# Patient Record
Sex: Male | Born: 1940 | Race: Black or African American | Hispanic: No | Marital: Single | Smoking: Former smoker
Health system: Southern US, Community
[De-identification: ages and names within clinical notes are randomized; demographics above are authoritative.]

## PROBLEM LIST (undated history)

## (undated) DIAGNOSIS — N312 Flaccid neuropathic bladder, not elsewhere classified: Secondary | ICD-10-CM

## (undated) DIAGNOSIS — M7500 Adhesive capsulitis of unspecified shoulder: Secondary | ICD-10-CM

## (undated) DIAGNOSIS — M25511 Pain in right shoulder: Secondary | ICD-10-CM

## (undated) DIAGNOSIS — E785 Hyperlipidemia, unspecified: Secondary | ICD-10-CM

## (undated) DIAGNOSIS — C801 Malignant (primary) neoplasm, unspecified: Secondary | ICD-10-CM

## (undated) DIAGNOSIS — N451 Epididymitis: Secondary | ICD-10-CM

## (undated) DIAGNOSIS — M25569 Pain in unspecified knee: Secondary | ICD-10-CM

## (undated) DIAGNOSIS — G8929 Other chronic pain: Secondary | ICD-10-CM

## (undated) DIAGNOSIS — N400 Enlarged prostate without lower urinary tract symptoms: Secondary | ICD-10-CM

## (undated) DIAGNOSIS — B029 Zoster without complications: Secondary | ICD-10-CM

## (undated) DIAGNOSIS — I10 Essential (primary) hypertension: Secondary | ICD-10-CM

## (undated) DIAGNOSIS — K0510 Chronic gingivitis, plaque induced: Secondary | ICD-10-CM

## (undated) DIAGNOSIS — R001 Bradycardia, unspecified: Secondary | ICD-10-CM

## (undated) HISTORY — DX: Hyperlipidemia, unspecified: E78.5

## (undated) HISTORY — DX: Chronic gingivitis, plaque induced: K05.10

## (undated) HISTORY — DX: Flaccid neuropathic bladder, not elsewhere classified: N31.2

## (undated) HISTORY — DX: Benign prostatic hyperplasia without lower urinary tract symptoms: N40.0

## (undated) HISTORY — DX: Essential (primary) hypertension: I10

## (undated) HISTORY — DX: Other chronic pain: G89.29

## (undated) HISTORY — PX: KNEE SURGERY: SHX244

## (undated) HISTORY — DX: Epididymitis: N45.1

## (undated) HISTORY — PX: TRANSURETHRAL RESECTION OF PROSTATE: SHX73

## (undated) HISTORY — DX: Pain in right shoulder: M25.511

## (undated) HISTORY — DX: Bradycardia, unspecified: R00.1

## (undated) HISTORY — PX: VASECTOMY: SHX75

## (undated) HISTORY — DX: Pain in unspecified knee: M25.569

## (undated) HISTORY — DX: Adhesive capsulitis of unspecified shoulder: M75.00

---

## 2000-10-10 ENCOUNTER — Encounter: Payer: Self-pay | Admitting: Emergency Medicine

## 2000-10-10 ENCOUNTER — Emergency Department (HOSPITAL_COMMUNITY): Admission: EM | Admit: 2000-10-10 | Discharge: 2000-10-10 | Payer: Self-pay | Admitting: Emergency Medicine

## 2000-11-25 ENCOUNTER — Emergency Department (HOSPITAL_COMMUNITY): Admission: EM | Admit: 2000-11-25 | Discharge: 2000-11-25 | Payer: Self-pay | Admitting: Emergency Medicine

## 2001-12-15 ENCOUNTER — Emergency Department (HOSPITAL_COMMUNITY): Admission: EM | Admit: 2001-12-15 | Discharge: 2001-12-15 | Payer: Self-pay

## 2001-12-26 ENCOUNTER — Emergency Department (HOSPITAL_COMMUNITY): Admission: EM | Admit: 2001-12-26 | Discharge: 2001-12-26 | Payer: Self-pay | Admitting: Emergency Medicine

## 2002-08-28 ENCOUNTER — Emergency Department (HOSPITAL_COMMUNITY): Admission: EM | Admit: 2002-08-28 | Discharge: 2002-08-28 | Payer: Self-pay

## 2002-09-25 ENCOUNTER — Encounter: Admission: RE | Admit: 2002-09-25 | Discharge: 2002-09-25 | Payer: Self-pay | Admitting: Internal Medicine

## 2002-10-02 ENCOUNTER — Encounter: Admission: RE | Admit: 2002-10-02 | Discharge: 2002-10-02 | Payer: Self-pay | Admitting: Internal Medicine

## 2003-03-09 ENCOUNTER — Emergency Department (HOSPITAL_COMMUNITY): Admission: EM | Admit: 2003-03-09 | Discharge: 2003-03-09 | Payer: Self-pay | Admitting: Emergency Medicine

## 2003-04-07 ENCOUNTER — Encounter: Admission: RE | Admit: 2003-04-07 | Discharge: 2003-04-07 | Payer: Self-pay | Admitting: Internal Medicine

## 2003-07-09 ENCOUNTER — Encounter: Admission: RE | Admit: 2003-07-09 | Discharge: 2003-07-09 | Payer: Self-pay | Admitting: Internal Medicine

## 2003-08-16 ENCOUNTER — Encounter: Admission: RE | Admit: 2003-08-16 | Discharge: 2003-08-16 | Payer: Self-pay | Admitting: Internal Medicine

## 2003-08-26 ENCOUNTER — Encounter: Admission: RE | Admit: 2003-08-26 | Discharge: 2003-08-26 | Payer: Self-pay | Admitting: Internal Medicine

## 2003-11-30 ENCOUNTER — Encounter: Admission: RE | Admit: 2003-11-30 | Discharge: 2003-11-30 | Payer: Self-pay | Admitting: Internal Medicine

## 2004-10-27 ENCOUNTER — Ambulatory Visit: Payer: Self-pay | Admitting: Internal Medicine

## 2004-11-07 ENCOUNTER — Ambulatory Visit: Payer: Self-pay | Admitting: Internal Medicine

## 2005-03-13 ENCOUNTER — Ambulatory Visit: Payer: Self-pay | Admitting: Internal Medicine

## 2005-03-27 ENCOUNTER — Emergency Department (HOSPITAL_COMMUNITY): Admission: EM | Admit: 2005-03-27 | Discharge: 2005-03-27 | Payer: Self-pay | Admitting: Emergency Medicine

## 2005-04-16 ENCOUNTER — Ambulatory Visit: Payer: Self-pay | Admitting: Cardiovascular Disease

## 2005-04-26 ENCOUNTER — Emergency Department (HOSPITAL_COMMUNITY): Admission: EM | Admit: 2005-04-26 | Discharge: 2005-04-26 | Payer: Self-pay | Admitting: Emergency Medicine

## 2005-04-27 ENCOUNTER — Ambulatory Visit: Payer: Self-pay

## 2005-06-22 HISTORY — PX: OTHER SURGICAL HISTORY: SHX169

## 2005-06-28 ENCOUNTER — Ambulatory Visit: Payer: Self-pay | Admitting: Internal Medicine

## 2005-07-13 ENCOUNTER — Ambulatory Visit (HOSPITAL_BASED_OUTPATIENT_CLINIC_OR_DEPARTMENT_OTHER): Admission: RE | Admit: 2005-07-13 | Discharge: 2005-07-13 | Payer: Self-pay | Admitting: Orthopedic Surgery

## 2005-07-13 ENCOUNTER — Ambulatory Visit (HOSPITAL_COMMUNITY): Admission: RE | Admit: 2005-07-13 | Discharge: 2005-07-13 | Payer: Self-pay | Admitting: Orthopedic Surgery

## 2005-09-28 ENCOUNTER — Emergency Department (HOSPITAL_COMMUNITY): Admission: EM | Admit: 2005-09-28 | Discharge: 2005-09-28 | Payer: Self-pay | Admitting: Emergency Medicine

## 2005-10-04 ENCOUNTER — Ambulatory Visit: Payer: Self-pay | Admitting: Internal Medicine

## 2006-01-15 ENCOUNTER — Ambulatory Visit: Payer: Self-pay | Admitting: Internal Medicine

## 2006-01-15 ENCOUNTER — Ambulatory Visit (HOSPITAL_COMMUNITY): Admission: RE | Admit: 2006-01-15 | Discharge: 2006-01-15 | Payer: Self-pay | Admitting: Internal Medicine

## 2006-01-23 ENCOUNTER — Ambulatory Visit: Payer: Self-pay | Admitting: Hospitalist

## 2006-03-03 ENCOUNTER — Emergency Department (HOSPITAL_COMMUNITY): Admission: EM | Admit: 2006-03-03 | Discharge: 2006-03-03 | Payer: Self-pay | Admitting: Emergency Medicine

## 2006-03-22 ENCOUNTER — Encounter (INDEPENDENT_AMBULATORY_CARE_PROVIDER_SITE_OTHER): Payer: Self-pay | Admitting: Internal Medicine

## 2006-03-22 ENCOUNTER — Ambulatory Visit: Payer: Self-pay | Admitting: Internal Medicine

## 2006-03-22 LAB — CONVERTED CEMR LAB: PSA: 10.23 ng/mL

## 2006-07-23 ENCOUNTER — Encounter (INDEPENDENT_AMBULATORY_CARE_PROVIDER_SITE_OTHER): Payer: Self-pay | Admitting: Specialist

## 2006-07-24 ENCOUNTER — Inpatient Hospital Stay (HOSPITAL_COMMUNITY): Admission: RE | Admit: 2006-07-24 | Discharge: 2006-07-26 | Payer: Self-pay | Admitting: Urology

## 2006-08-13 ENCOUNTER — Emergency Department (HOSPITAL_COMMUNITY): Admission: EM | Admit: 2006-08-13 | Discharge: 2006-08-13 | Payer: Self-pay | Admitting: Emergency Medicine

## 2006-08-23 ENCOUNTER — Ambulatory Visit: Payer: Self-pay | Admitting: Internal Medicine

## 2006-11-04 ENCOUNTER — Encounter (INDEPENDENT_AMBULATORY_CARE_PROVIDER_SITE_OTHER): Payer: Self-pay | Admitting: Internal Medicine

## 2006-11-04 DIAGNOSIS — I10 Essential (primary) hypertension: Secondary | ICD-10-CM

## 2006-11-04 DIAGNOSIS — N400 Enlarged prostate without lower urinary tract symptoms: Secondary | ICD-10-CM

## 2007-02-16 ENCOUNTER — Emergency Department (HOSPITAL_COMMUNITY): Admission: EM | Admit: 2007-02-16 | Discharge: 2007-02-16 | Payer: Self-pay | Admitting: Emergency Medicine

## 2007-03-27 ENCOUNTER — Ambulatory Visit: Payer: Self-pay | Admitting: Internal Medicine

## 2007-03-27 ENCOUNTER — Encounter (INDEPENDENT_AMBULATORY_CARE_PROVIDER_SITE_OTHER): Payer: Self-pay | Admitting: Internal Medicine

## 2007-03-27 LAB — CONVERTED CEMR LAB
ALT: 13 units/L (ref 0–53)
Albumin: 3.9 g/dL (ref 3.5–5.2)
BUN: 19 mg/dL (ref 6–23)
Calcium: 8.8 mg/dL (ref 8.4–10.5)
Cholesterol: 156 mg/dL (ref 0–200)
Creatinine, Ser: 0.98 mg/dL (ref 0.40–1.50)
HCT: 46.1 % (ref 39.0–52.0)
Platelets: 299 10*3/uL (ref 150–400)
Sodium: 141 meq/L (ref 135–145)
Total Bilirubin: 0.5 mg/dL (ref 0.3–1.2)
Total Protein: 7.3 g/dL (ref 6.0–8.3)
WBC: 5.8 10*3/uL (ref 4.0–10.5)

## 2007-06-24 ENCOUNTER — Ambulatory Visit: Payer: Self-pay | Admitting: Hospitalist

## 2007-06-24 ENCOUNTER — Ambulatory Visit (HOSPITAL_COMMUNITY): Admission: RE | Admit: 2007-06-24 | Discharge: 2007-06-24 | Payer: Self-pay | Admitting: Hospitalist

## 2007-06-24 DIAGNOSIS — M25569 Pain in unspecified knee: Secondary | ICD-10-CM

## 2007-08-31 ENCOUNTER — Emergency Department (HOSPITAL_COMMUNITY): Admission: EM | Admit: 2007-08-31 | Discharge: 2007-08-31 | Payer: Self-pay | Admitting: Emergency Medicine

## 2007-12-01 ENCOUNTER — Ambulatory Visit: Payer: Self-pay | Admitting: Hospitalist

## 2007-12-01 ENCOUNTER — Encounter (INDEPENDENT_AMBULATORY_CARE_PROVIDER_SITE_OTHER): Payer: Self-pay | Admitting: *Deleted

## 2007-12-09 ENCOUNTER — Telehealth (INDEPENDENT_AMBULATORY_CARE_PROVIDER_SITE_OTHER): Payer: Self-pay | Admitting: *Deleted

## 2007-12-10 ENCOUNTER — Encounter (INDEPENDENT_AMBULATORY_CARE_PROVIDER_SITE_OTHER): Payer: Self-pay | Admitting: *Deleted

## 2007-12-10 ENCOUNTER — Ambulatory Visit: Payer: Self-pay | Admitting: Internal Medicine

## 2007-12-10 LAB — CONVERTED CEMR LAB: Streptococcus, Group A Screen (Direct): NEGATIVE

## 2008-03-03 ENCOUNTER — Telehealth (INDEPENDENT_AMBULATORY_CARE_PROVIDER_SITE_OTHER): Payer: Self-pay | Admitting: Internal Medicine

## 2008-03-03 ENCOUNTER — Emergency Department (HOSPITAL_COMMUNITY): Admission: EM | Admit: 2008-03-03 | Discharge: 2008-03-03 | Payer: Self-pay | Admitting: Emergency Medicine

## 2008-03-04 ENCOUNTER — Ambulatory Visit: Payer: Self-pay | Admitting: *Deleted

## 2008-03-11 ENCOUNTER — Encounter (INDEPENDENT_AMBULATORY_CARE_PROVIDER_SITE_OTHER): Payer: Self-pay | Admitting: Internal Medicine

## 2008-03-11 DIAGNOSIS — N453 Epididymo-orchitis: Secondary | ICD-10-CM | POA: Insufficient documentation

## 2008-03-18 ENCOUNTER — Ambulatory Visit: Payer: Self-pay | Admitting: Internal Medicine

## 2008-03-18 ENCOUNTER — Encounter (INDEPENDENT_AMBULATORY_CARE_PROVIDER_SITE_OTHER): Payer: Self-pay | Admitting: Internal Medicine

## 2008-03-18 DIAGNOSIS — N312 Flaccid neuropathic bladder, not elsewhere classified: Secondary | ICD-10-CM | POA: Insufficient documentation

## 2008-03-18 DIAGNOSIS — N390 Urinary tract infection, site not specified: Secondary | ICD-10-CM | POA: Insufficient documentation

## 2008-03-18 DIAGNOSIS — R339 Retention of urine, unspecified: Secondary | ICD-10-CM

## 2008-03-18 LAB — CONVERTED CEMR LAB
ALT: 11 units/L (ref 0–53)
Albumin: 3.8 g/dL (ref 3.5–5.2)
Alkaline Phosphatase: 62 units/L (ref 39–117)
BUN: 16 mg/dL (ref 6–23)
CO2: 25 meq/L (ref 19–32)
Cholesterol: 134 mg/dL (ref 0–200)
Total CHOL/HDL Ratio: 3.4
VLDL: 12 mg/dL (ref 0–40)

## 2008-04-12 ENCOUNTER — Encounter (INDEPENDENT_AMBULATORY_CARE_PROVIDER_SITE_OTHER): Payer: Self-pay | Admitting: Internal Medicine

## 2008-06-03 ENCOUNTER — Emergency Department (HOSPITAL_COMMUNITY): Admission: EM | Admit: 2008-06-03 | Discharge: 2008-06-03 | Payer: Self-pay | Admitting: Emergency Medicine

## 2008-06-04 ENCOUNTER — Emergency Department (HOSPITAL_COMMUNITY): Admission: EM | Admit: 2008-06-04 | Discharge: 2008-06-04 | Payer: Self-pay | Admitting: Family Medicine

## 2008-06-15 ENCOUNTER — Encounter (INDEPENDENT_AMBULATORY_CARE_PROVIDER_SITE_OTHER): Payer: Self-pay | Admitting: Internal Medicine

## 2008-06-30 ENCOUNTER — Encounter (INDEPENDENT_AMBULATORY_CARE_PROVIDER_SITE_OTHER): Payer: Self-pay | Admitting: Internal Medicine

## 2008-06-30 ENCOUNTER — Ambulatory Visit: Payer: Self-pay | Admitting: Internal Medicine

## 2008-06-30 DIAGNOSIS — H6120 Impacted cerumen, unspecified ear: Secondary | ICD-10-CM

## 2008-07-14 ENCOUNTER — Encounter (INDEPENDENT_AMBULATORY_CARE_PROVIDER_SITE_OTHER): Payer: Self-pay | Admitting: Internal Medicine

## 2008-07-27 ENCOUNTER — Ambulatory Visit (HOSPITAL_COMMUNITY): Admission: RE | Admit: 2008-07-27 | Discharge: 2008-07-27 | Payer: Self-pay | Admitting: Internal Medicine

## 2008-07-27 ENCOUNTER — Ambulatory Visit: Payer: Self-pay | Admitting: Internal Medicine

## 2008-07-27 DIAGNOSIS — R001 Bradycardia, unspecified: Secondary | ICD-10-CM | POA: Insufficient documentation

## 2008-07-28 ENCOUNTER — Ambulatory Visit (HOSPITAL_BASED_OUTPATIENT_CLINIC_OR_DEPARTMENT_OTHER): Admission: RE | Admit: 2008-07-28 | Discharge: 2008-07-28 | Payer: Self-pay | Admitting: Orthopedic Surgery

## 2008-08-02 ENCOUNTER — Encounter (INDEPENDENT_AMBULATORY_CARE_PROVIDER_SITE_OTHER): Payer: Self-pay | Admitting: Internal Medicine

## 2008-08-03 ENCOUNTER — Encounter (INDEPENDENT_AMBULATORY_CARE_PROVIDER_SITE_OTHER): Payer: Self-pay | Admitting: Internal Medicine

## 2008-08-19 ENCOUNTER — Encounter (INDEPENDENT_AMBULATORY_CARE_PROVIDER_SITE_OTHER): Payer: Self-pay | Admitting: Internal Medicine

## 2008-10-13 ENCOUNTER — Encounter (INDEPENDENT_AMBULATORY_CARE_PROVIDER_SITE_OTHER): Payer: Self-pay | Admitting: Internal Medicine

## 2008-12-03 ENCOUNTER — Ambulatory Visit: Payer: Self-pay | Admitting: Internal Medicine

## 2008-12-03 ENCOUNTER — Encounter (INDEPENDENT_AMBULATORY_CARE_PROVIDER_SITE_OTHER): Payer: Self-pay | Admitting: Internal Medicine

## 2008-12-06 ENCOUNTER — Emergency Department (HOSPITAL_COMMUNITY): Admission: EM | Admit: 2008-12-06 | Discharge: 2008-12-06 | Payer: Self-pay | Admitting: Emergency Medicine

## 2008-12-06 DIAGNOSIS — E785 Hyperlipidemia, unspecified: Secondary | ICD-10-CM | POA: Insufficient documentation

## 2008-12-06 LAB — CONVERTED CEMR LAB
ALT: 17 units/L (ref 0–53)
Albumin: 4 g/dL (ref 3.5–5.2)
Alkaline Phosphatase: 77 units/L (ref 39–117)
BUN: 15 mg/dL (ref 6–23)
CO2: 26 meq/L (ref 19–32)
Calcium: 9.1 mg/dL (ref 8.4–10.5)
Chloride: 101 meq/L (ref 96–112)
Creatinine, Ser: 1.01 mg/dL (ref 0.40–1.50)
Hemoglobin: 15.5 g/dL (ref 13.0–17.0)
LDL Cholesterol: 110 mg/dL — ABNORMAL HIGH (ref 0–99)
MCHC: 33.7 g/dL (ref 30.0–36.0)
RDW: 13.5 % (ref 11.5–15.5)
Total CHOL/HDL Ratio: 3.2
Total Protein: 7.5 g/dL (ref 6.0–8.3)

## 2009-01-14 ENCOUNTER — Ambulatory Visit: Payer: Self-pay | Admitting: Internal Medicine

## 2009-02-17 ENCOUNTER — Encounter (INDEPENDENT_AMBULATORY_CARE_PROVIDER_SITE_OTHER): Payer: Self-pay | Admitting: Internal Medicine

## 2009-02-17 ENCOUNTER — Ambulatory Visit: Payer: Self-pay | Admitting: Internal Medicine

## 2009-02-18 LAB — CONVERTED CEMR LAB
ALT: 26 units/L (ref 0–53)
AST: 23 units/L (ref 0–37)
Albumin: 3.9 g/dL (ref 3.5–5.2)
BUN: 12 mg/dL (ref 6–23)
Chloride: 104 meq/L (ref 96–112)
GFR calc Af Amer: >60 mL/min (ref >60)
HDL: 50 mg/dL (ref >39)
Total CHOL/HDL Ratio: 2.9
Total Protein: 7.1 g/dL (ref 6.0–8.3)
Triglycerides: 63 mg/dL (ref <150)
VLDL: 13 mg/dL (ref 0–40)

## 2009-04-07 ENCOUNTER — Encounter (INDEPENDENT_AMBULATORY_CARE_PROVIDER_SITE_OTHER): Payer: Self-pay | Admitting: Internal Medicine

## 2009-04-15 ENCOUNTER — Encounter (INDEPENDENT_AMBULATORY_CARE_PROVIDER_SITE_OTHER): Payer: Self-pay | Admitting: Internal Medicine

## 2009-05-03 ENCOUNTER — Ambulatory Visit: Payer: Self-pay | Admitting: Internal Medicine

## 2009-06-29 ENCOUNTER — Encounter (INDEPENDENT_AMBULATORY_CARE_PROVIDER_SITE_OTHER): Payer: Self-pay | Admitting: Internal Medicine

## 2009-07-18 ENCOUNTER — Ambulatory Visit: Payer: Self-pay | Admitting: Infectious Diseases

## 2009-09-20 ENCOUNTER — Emergency Department (HOSPITAL_COMMUNITY): Admission: EM | Admit: 2009-09-20 | Discharge: 2009-09-20 | Payer: Self-pay | Admitting: Emergency Medicine

## 2009-09-26 ENCOUNTER — Ambulatory Visit: Payer: Self-pay | Admitting: Internal Medicine

## 2009-09-26 DIAGNOSIS — S01502A Unspecified open wound of oral cavity, initial encounter: Secondary | ICD-10-CM | POA: Insufficient documentation

## 2009-09-26 LAB — CONVERTED CEMR LAB

## 2009-10-11 ENCOUNTER — Ambulatory Visit: Payer: Self-pay | Admitting: Internal Medicine

## 2009-10-11 LAB — CONVERTED CEMR LAB: OCCULT 3: POSITIVE

## 2010-01-25 ENCOUNTER — Emergency Department (HOSPITAL_COMMUNITY): Admission: EM | Admit: 2010-01-25 | Discharge: 2010-01-25 | Payer: Self-pay | Admitting: Emergency Medicine

## 2010-01-31 ENCOUNTER — Ambulatory Visit: Payer: Self-pay | Admitting: Internal Medicine

## 2010-01-31 LAB — CONVERTED CEMR LAB
Alkaline Phosphatase: 82 units/L (ref 39–117)
Calcium: 8.6 mg/dL (ref 8.4–10.5)
Creatinine, Ser: 0.92 mg/dL (ref 0.40–1.50)
Glucose, Bld: 104 mg/dL — ABNORMAL HIGH (ref 70–99)
MCHC: 33 g/dL (ref 30.0–36.0)
Platelets: 265 10*3/uL (ref 150–400)
Potassium: 3.8 meq/L (ref 3.5–5.3)
RBC: 4.94 M/uL (ref 4.22–5.81)
Total Protein: 6.8 g/dL (ref 6.0–8.3)

## 2010-02-02 DIAGNOSIS — M25539 Pain in unspecified wrist: Secondary | ICD-10-CM

## 2010-02-16 ENCOUNTER — Ambulatory Visit: Payer: Self-pay | Admitting: Internal Medicine

## 2010-02-16 DIAGNOSIS — K921 Melena: Secondary | ICD-10-CM

## 2010-02-16 LAB — CONVERTED CEMR LAB
ALT: 21 units/L (ref 0–53)
Alkaline Phosphatase: 75 units/L (ref 39–117)
BUN: 17 mg/dL (ref 6–23)
Calcium: 9 mg/dL (ref 8.4–10.5)
Chloride: 101 meq/L (ref 96–112)
Creatinine, Ser: 1.01 mg/dL (ref 0.40–1.50)
Glucose, Bld: 87 mg/dL (ref 70–99)
HDL: 47 mg/dL (ref >39)
Total Bilirubin: 0.4 mg/dL (ref 0.3–1.2)
Total CHOL/HDL Ratio: 3
Total Protein: 7.1 g/dL (ref 6.0–8.3)
Triglycerides: 58 mg/dL (ref <150)

## 2010-02-21 ENCOUNTER — Ambulatory Visit (HOSPITAL_COMMUNITY): Admission: RE | Admit: 2010-02-21 | Discharge: 2010-02-21 | Payer: Self-pay | Admitting: Internal Medicine

## 2010-02-21 ENCOUNTER — Encounter (INDEPENDENT_AMBULATORY_CARE_PROVIDER_SITE_OTHER): Payer: Self-pay | Admitting: Internal Medicine

## 2010-02-21 ENCOUNTER — Ambulatory Visit: Payer: Self-pay | Admitting: Vascular Surgery

## 2010-03-01 ENCOUNTER — Telehealth (INDEPENDENT_AMBULATORY_CARE_PROVIDER_SITE_OTHER): Payer: Self-pay | Admitting: Internal Medicine

## 2010-03-01 ENCOUNTER — Telehealth: Payer: Self-pay | Admitting: *Deleted

## 2010-03-02 ENCOUNTER — Encounter (INDEPENDENT_AMBULATORY_CARE_PROVIDER_SITE_OTHER): Payer: Self-pay | Admitting: Internal Medicine

## 2010-04-12 ENCOUNTER — Telehealth (INDEPENDENT_AMBULATORY_CARE_PROVIDER_SITE_OTHER): Payer: Self-pay | Admitting: Internal Medicine

## 2010-05-01 ENCOUNTER — Emergency Department (HOSPITAL_COMMUNITY): Admission: EM | Admit: 2010-05-01 | Discharge: 2010-05-01 | Payer: Self-pay | Admitting: Emergency Medicine

## 2010-07-20 ENCOUNTER — Encounter: Payer: Self-pay | Admitting: Internal Medicine

## 2010-08-02 ENCOUNTER — Emergency Department (HOSPITAL_COMMUNITY): Admission: EM | Admit: 2010-08-02 | Discharge: 2010-08-02 | Payer: Self-pay | Admitting: Emergency Medicine

## 2010-08-09 ENCOUNTER — Ambulatory Visit: Payer: Self-pay | Admitting: Internal Medicine

## 2010-09-26 ENCOUNTER — Encounter: Payer: Self-pay | Admitting: Internal Medicine

## 2010-10-06 ENCOUNTER — Emergency Department (HOSPITAL_COMMUNITY)
Admission: EM | Admit: 2010-10-06 | Discharge: 2010-10-06 | Payer: Self-pay | Source: Home / Self Care | Admitting: Emergency Medicine

## 2010-10-20 ENCOUNTER — Telehealth: Payer: Self-pay | Admitting: Internal Medicine

## 2010-11-09 ENCOUNTER — Ambulatory Visit: Admit: 2010-11-09 | Payer: Self-pay

## 2010-11-14 ENCOUNTER — Ambulatory Visit
Admission: RE | Admit: 2010-11-14 | Discharge: 2010-11-14 | Payer: Self-pay | Source: Home / Self Care | Attending: Urology | Admitting: Urology

## 2010-11-14 HISTORY — PX: OTHER SURGICAL HISTORY: SHX169

## 2010-11-15 LAB — POCT I-STAT 4, (NA,K, GLUC, HGB,HCT): Potassium: 4.5 mEq/L (ref 3.5–5.1)

## 2010-11-16 ENCOUNTER — Emergency Department (HOSPITAL_COMMUNITY)
Admission: EM | Admit: 2010-11-16 | Discharge: 2010-11-16 | Payer: Self-pay | Source: Home / Self Care | Admitting: Emergency Medicine

## 2010-11-16 LAB — DIFFERENTIAL
Basophils Relative: 0 % (ref 0–1)
Lymphocytes Relative: 15 % (ref 12–46)
Lymphs Abs: 1.6 10*3/uL (ref 0.7–4.0)
Monocytes Absolute: 1.5 10*3/uL — ABNORMAL HIGH (ref 0.1–1.0)
Neutro Abs: 7 10*3/uL (ref 1.7–7.7)

## 2010-11-16 LAB — CBC
HCT: 41.7 % (ref 39.0–52.0)
Hemoglobin: 14 g/dL (ref 13.0–17.0)
MCHC: 33.6 g/dL (ref 30.0–36.0)
MCV: 91.4 fL (ref 78.0–100.0)
RDW: 13.2 % (ref 11.5–15.5)
WBC: 10.4 10*3/uL (ref 4.0–10.5)

## 2010-11-16 LAB — BASIC METABOLIC PANEL
BUN: 10 mg/dL (ref 6–23)
Calcium: 8.4 mg/dL (ref 8.4–10.5)
Chloride: 101 mEq/L (ref 96–112)
Creatinine, Ser: 0.83 mg/dL (ref 0.4–1.5)
Potassium: 4.8 mEq/L (ref 3.5–5.1)
Sodium: 138 mEq/L (ref 135–145)

## 2010-11-16 LAB — URINALYSIS, ROUTINE W REFLEX MICROSCOPIC
Nitrite: NEGATIVE
Specific Gravity, Urine: 1.012 (ref 1.005–1.030)

## 2010-11-16 LAB — URINE MICROSCOPIC-ADD ON

## 2010-11-17 LAB — BODY FLUID CULTURE: Culture: NO GROWTH

## 2010-11-17 NOTE — Op Note (Signed)
[name], [name]             ACCOUNT NO.:  [id number]  MEDICAL RECORD NO.:  [id number]          PATIENT TYPE:  AMB  LOCATION:  [facility]                         FACILITY:  [facility]  PHYSICIAN:  Excell Seltzer. Annabell Howells, M.D.    DATE OF BIRTH:  08-18-1941  DATE OF PROCEDURE: DATE OF DISCHARGE:                              OPERATIVE REPORT   PREOPERATIVE DIAGNOSIS:  Right hydrocele with bilateral recurrent epididymitis.  POSTOPERATIVE DIAGNOSES:  Right spermatocele, left hydrocele, bilateral recurrent epididymitis.  PROCEDURE:  Bilateral vasectomy, right spermatocelectomy, left hydrocelectomy.  SURGEON:  Excell Seltzer. Annabell Howells, M.D.  ANESTHESIA:  General.  SPECIMEN:  Cultures from right spermatocele sac and left hydrocele sac, bilateral vas sections and wall from right spermatocele; all sent to pathology.  DRAINS:  Quarter-inch Penrose drain.  BLOOD LOSS:  Minimal.  COMPLICATIONS:  None.  INDICATIONS:  Greg [name] is a 70 year old white male with history of neurogenic bladder, on self-cath.  He has been plagued by recurrent epididymo-orchitis and most recently presented with moderate right hydrocele and it was felt that hydrocelectomy was indicated and also because of his recurring epididymitis, vasectomy was recommended to try to help reduce the incidence of recurrence.  FINDINGS AND PROCEDURE:  He was given Cipro and a gram of Ancef and was taken to the operating room.  He had reported in the holding area that the swelling had declined.  A general anesthetic was induced.  He was placed on the supine position and fitted with PAS hose.  Examination of the scrotum demonstrated significant reduction in size of the right scrotal swelling; however, there was still mass off the testicle where spermatocele versus inflamed epididymis and vas deferens were not readily palpated due to the surrounding inflammatory changes.  There was also some swelling of the left testicular area but it was  smoother.  It was felt that, he needs to proceed with the vastctomy with scrotal exploration as well.  A midline incision was made on the scrotal raphe from the base of the penis down the right testicle and was delivered using Bovie cautery through the dartos and once delivered so that the cord was easily dissected out.  The vas deferens was identified and was dissected out, and was divided.  A small section was removed and then the ends were doubly ligated with 0 Vicryl ties.  I then inspected the testicle itself.  There was no evidence of hydrocele, but there was about a 2 to 3 cm spermatocele at the head of the epididymis.  A 22- gauge needle and 10 mL syringe were used to aspirate fluid from the spermatocele.  It was bloody and cloudy and was sent for culture.  I elected to unroof the spermatocele which was done using the Bovie.  A section of the wall was excised and sent for pathological evaluation and then the rim of the cavity was oversewn with a running locked 3-0 chromic suture.  I could not do a full excision of the spermatocele due to the marked inflammatory chronic and acute inflammatory changes. Also, it was felt that an epididymectomy which might have been a best case procedure would have been  impossible that removal of the testicle due to the degree of inflammation of the epididymis.  Once the right side had been taken care of, the left was delivered through the midline rhaphe.  Inspection revealed a small left hydrocele. Once the cord had been dissected out sufficiently, the vas was identified.  It was dissected out and divided with a small section removed and doubly ligated with a 0 Vicryl in similar fashion to the right side.  The hydrocele sac was then aspirated using a fresh 22-gauge needle and 10 mL syringe and this fluid was sent for culture as well. However, it was much clear and appeared uninfected. I did open the hydrocele sac and imbricated behind the testicle  using interrupted 3-0 chromic sutures.  Once this portion of the procedure had been completed, the testicles were returned to the scrotum and the scrotum was inspected for hemostasis.  No active bleeding was noted.  A quarter-inch Penrose drain was placed through separate stab wound in the dependent portion of the right hemiscrotum and was carried through the median raphe into the left to provide drainage from both areas.  At this point, the dartos was closed using a running 3-0 chromic with great care being taken to avoid incorporation of the drain.  The skin was closed with interrupted vertical mattress 3-0 chromic.  The drain was trimmed.  The dressing of 4x4s and a fluff Kerlix was applied.  The patient's anesthetic was reversed.  He was admitted to recovery room in stable condition.  There were no complications.     Excell Seltzer. Annabell Howells, M.D.     JJW/MEDQ  D:  11/14/2010  T:  11/14/2010  Job:  [id number]  Electronically Signed by Bjorn Pippin M.D. on 11/15/2010 11:18:51 AM

## 2010-11-21 NOTE — Assessment & Plan Note (Signed)
Summary: EST-2 WEEK RECHECK PER [facility]   Vital Signs:  Patient profile:   70 year old male Height:      71 inches (180.34 cm) Weight:      197.5 pounds (89.77 kg) BMI:     27.65 Temp:     96.8 degrees F oral Pulse rate:   63 / minute BP sitting:   138 / 88  (right arm)  Vitals Entered By: Chinita Pester RN (February 16, 2010 10:03 AM) CC: Went to ED about 2 weeks for pain in left wrist; brace on. Is Patient Diabetic? No Pain Assessment Patient in pain? no      Nutritional Status BMI of 25 - 29 = overweight  Have you ever been in a relationship where you felt threatened, hurt or afraid?No   Does patient need assistance? Functional Status Self care Ambulation Normal   Primary Care Provider:  Joaquin Courts  MD  CC:  Micah Flesher to ED about 2 weeks for pain in left wrist; brace on..  History of Present Illness: Pt is a 70 yo male w/ past med hx below here for f/u of L wrist pain.  Pain is better but he still has shooting pain in the wrist.  No known injury to the wrist.  Pain is located to the wrist and does not radiate.  It is a shooting pain and is intermittent and lasts a few seconds then resolves on its own.  He denies numbness and tingling, fevers, chills, morning stiffness.  He notes it swells occasionally.  He follows w/ Dr. Annabell Howells for his prostate issue and is currently on abx and last saw him 1 week ago.  He denies any hx of falls and memory troubles.   Also, he notes his L leg feels "heavy" periodically.  Symptoms never occur at night, he thinks it may be exertional but is not sure.  He denies pain, parasthesias, and numbness.    Depression History:      The patient denies a depressed mood most of the day.  The patient denies insomnia.         Preventive Screening-Counseling & Management  Alcohol-Tobacco     Alcohol drinks/day: 0     Smoking Status: quit     Year Quit: 21 years ago  Caffeine-Diet-Exercise     Does Patient Exercise: yes     Type of exercise:  WALKING     Times/week: 7  Current Medications (verified): 1)  Hydrochlorothiazide 25 Mg Tabs (Hydrochlorothiazide) .... Take 1  Tablet By Mouth Once A Day 2)  Tylenol Extra Strength 500 Mg Tabs (Acetaminophen) .... Take One To Two Tablets By Mouth Every Eight Hours As Needed For Pain. 3)  Pravachol 40 Mg Tabs (Pravastatin Sodium) .... Take 1 Tablet By Mouth Once A Day 4)  Cipro 250 Mg Tabs (Ciprofloxacin Hcl)  Allergies (verified): 1)  ! Ibuprofen  Past History:  Past Medical History: Last updated: 11/04/2006 Hypertension Benign prostatic hypertrophy and hyposensitive bladder, nodular prostate with increased PSA (10.23 in 03/2006), s/p prostate Bx 2006  that was normal (Dr. Annabell Howells). Seen by Dr. Annabell Howells in 06/2006 and he recommended TURP gingivitis right shoulder pain - Sx in 06/2005 left frozen shoulder  Past Surgical History: Last updated: 05/03/2009 arthroscopic right shoulder surgery 06/2005 Status post TURP 2/2 [name] in 2008  Social History: Last updated: 05/03/2009 Former Smoker - quit 20 years ago Alcohol use-no Retired Drug use-no Education: High School level  Risk Factors: Smoking Status: quit (02/16/2010)  Physical Exam  General:  alert, oriented, appropriately groomed, no distress.  Eyes:  anicteric, muddied sclera.   Neck:  no LAD, no JVD, no carotid bruit.  Lungs:  normal respiratory effort and normal breath sounds.   Heart:  normal rate, regular rhythm, no murmur, no gallop, and no rub.   Abdomen:  +BS's, soft, NT and ND. Msk:  L wrist minimally TTP on the palmer side, ROM equal bilaterally, Phalen and Tinel test neg.  Pulses:   2+ DP pulses on R, 1+ on L.  Nl radial pulses bilaterally. Extremities:  No peripheral edema.  2+ DP pulses on R, 1+ on L.  Decreased hair on bilateral lower extremities noted.  Neurologic:  sensation intact in bilateral hands.  Cervical Nodes:  No lymphadenopathy noted Psych:  mood euthymic.    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  Pain only lasts a few seconds.  Given how mild his symptoms are now, will plan for tylenol as needed.  He will return if pain worsens.  Fortunatley, it is doing better now.  Problem # 2:  HYPERLIPIDEMIA (ICD-272.4) Lipids/LFT's today.  His updated medication list for this problem includes:    Pravachol 40 Mg Tabs (Pravastatin sodium) .Marland Kitchen... Take 1 tablet by mouth once a day  Orders: T-Lipid Profile ([id number])  Problem # 3:  ? of CLAUDICATION (ICD-443.9) He has heaviness of L leg that only occurs during the daytime intermittently.  It may be related to prior surgery on that knee, however, while it is atypical, must also consider claudication.  Will check ABI's.  Orders: LE Arterial Doppler/ABI (LE arterial doppler)  Problem # 4:  HYPERTENSION (ICD-401.9) BP trending up, discussed diet/exercise.  F/u BMEt today.  His updated medication list for this problem includes:    Hydrochlorothiazide 25 Mg Tabs (Hydrochlorothiazide) .Marland Kitchen... Take 1  tablet by mouth once a day  Problem # 5:  HEMOCCULT POSITIVE STOOL (ICD-578.1) Noted FOBT + stool in 12/10.  Hgb recently checked and nl.  Discussed the importance of seeing GI today extensively.  He is agreeable to following up with them.  Appt made.  Will make sure he made his visit next time he is seen.   Problem # 6:  UTI (ICD-599.0) Follows w/ Dr. Annabell Howells in urology.  He notes last day of abx is in 2 days.  His updated medication list for this problem includes:    Cipro 250 Mg Tabs (Ciprofloxacin hcl)  Complete Medication List: 1)  Hydrochlorothiazide 25 Mg Tabs (Hydrochlorothiazide) .... Take 1  tablet by mouth once a day 2)  Tylenol Extra Strength 500 Mg Tabs (Acetaminophen) .... Take one to two tablets by mouth every eight hours as needed for pain. 3)  Pravachol 40 Mg Tabs (Pravastatin sodium) .... Take 1 tablet by mouth once a day 4)  Cipro 250 Mg Tabs (Ciprofloxacin  hcl) 5)  Tylenol Ex St Arthritis Pain 500 Mg Tabs (Acetaminophen) .... Take two tabs by mouth as needed every 6-8 hours for pain.  Other Orders: T-Comprehensive Metabolic Panel [id number])  Patient Instructions: 1)  Please make a followup appointment in 2-3 months. 2)  Call sooner if you need anything. 3)  PLEASE SEE THE GASTROENTEROLOGIST, [facility], ON MAY 10TH AT 9 AM. 4)  Please get the test done on your legs. 5)  You can take the dose of tylenol written above for pain.   Prevention & Chronic Care Immunizations   Influenza vaccine: Fluvax 3+  (09/26/2009)  Tetanus booster: Not documented   Td booster deferral: Deferred  (01/31/2010)    Pneumococcal vaccine: Pneumovax (Medicare)  (09/26/2009)   Pneumococcal vaccine due: 01/20/2014    H. zoster vaccine: Not documented  Colorectal Screening   Hemoccult: Not documented   Hemoccult action/deferral: Ordered  (09/26/2009)    Colonoscopy: Not documented   Colonoscopy action/deferral: GI referral  (09/26/2009)  Other Screening   PSA: 10.23  (03/22/2006)   PSA action/deferral: Discussion deferred  (09/26/2009)   Smoking status: quit  (02/16/2010)  Lipids   Total Cholesterol: 146  (02/17/2009)   Lipid panel action/deferral: Lipid Panel ordered   LDL: 83  (02/17/2009)   LDL Direct: Not documented   HDL: 50  (02/17/2009)   Triglycerides: 63  (02/17/2009)    SGOT (AST): 16  (01/31/2010)   BMP action: Ordered   SGPT (ALT): 14  (01/31/2010) CMP ordered    Alkaline phosphatase: 82  (01/31/2010)   Total bilirubin: 0.5  (01/31/2010)    Lipid flowsheet reviewed?: Yes   Progress toward LDL goal: At goal  Hypertension   Last Blood Pressure: 138 / 88  (02/16/2010)   Serum creatinine: 0.92  (01/31/2010)   BMP action: Ordered   Serum potassium 3.8  (01/31/2010) CMP ordered     Hypertension flowsheet reviewed?: Yes   Progress toward BP goal: Unchanged  Self-Management Support :   Personal Goals (by the next clinic  visit) :      Personal blood pressure goal: 130/80  (07/18/2009)     Personal LDL goal: 100  (07/18/2009)    Hypertension self-management support: Resources for patients handout  (02/16/2010)    Lipid self-management support: Resources for patients handout  (02/16/2010)        Resource handout printed.  Process Orders Check Orders Results:     [facility]: Check successful Tests Sent for requisitioning (February 16, 2010 10:56 AM):     02/16/2010: [facility] -- T-Lipid Profile [phone] (signed)     02/16/2010: [facility] -- T-Comprehensive Metabolic Panel [phone] (signed)

## 2010-11-21 NOTE — Assessment & Plan Note (Signed)
Summary: ER/FU SB.   Vital Signs:  Patient profile:   70 year old male Height:      71 inches (180.34 cm) Weight:      189.5 pounds (86.14 kg) BMI:     26.53 Temp:     97.0 degrees F (36.11 degrees C) oral Pulse rate:   46 / minute BP sitting:   129 / 73  (right arm)  Vitals Entered By: Stanton Kidney Ditzler RN (August 09, 2010 10:43 AM) Is Patient Diabetic? No Pain Assessment Patient in pain? no      Nutritional Status BMI of 25 - 29 = overweight Nutritional Status Detail appetite good  Have you ever been in a relationship where you felt threatened, hurt or afraid?denies   Does patient need assistance? Functional Status Self care Ambulation Normal Comments FU ER - ck right ear.   Primary Care Provider:  Joaquin Courts  MD   History of Present Illness: 70 yr old man with pmhx as described below comes to the clinic for ED follow up. Patient has no complains. Denies any dizziness which is why he went to the ED for.  Patient would like me to check his ears as in the past he has had some wax buildup. Patient does not clean his ears himself because he is scared to hurt himself. Would like to get referred to ENT to have ears cleaned out.   Patient refused Colonoscopy.   Patient saw Urologist a couple of weeks ago. Started him on Trimethoprim.  Depression History:      The patient denies a depressed mood most of the day and a diminished interest in his usual daily activities.         Preventive Screening-Counseling & Management  Alcohol-Tobacco     Alcohol drinks/day: 0     Smoking Status: quit     Year Quit: 21 years ago  Caffeine-Diet-Exercise     Does Patient Exercise: yes     Type of exercise: WALKING     Times/week: 7  Problems Prior to Update: 1)  Hemoccult Positive Stool  (ICD-578.1) 2)  ? 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 Take 1  Tablet By Mouth Once A Day 2)  Tylenol Extra Strength 500 Mg Tabs (Acetaminophen) .... Take One To Two Tablets By Mouth Every Eight Hours As Needed For Pain. 3)  Pravachol 40 Mg Tabs (Pravastatin Sodium) .... Take 1 Tablet By Mouth Once A Day 4)  Cipro 250 Mg Tabs (Ciprofloxacin Hcl) 5)  Tylenol Ex St Arthritis Pain 500 Mg Tabs (Acetaminophen) .... Take Two Tabs By Mouth As Needed Every 6-8 Hours For Pain.  Current Medications (verified): 1)  Hydrochlorothiazide 25 Mg Tabs (Hydrochlorothiazide) .... Take 1  Tablet By Mouth Once A Day 2)  Tylenol Extra Strength 500 Mg Tabs (Acetaminophen) .... Take One To Two Tablets By Mouth Every Eight Hours As Needed For Pain. 3)  Pravachol 40 Mg  Tabs (Pravastatin Sodium) .... Take 1 Tablet By Mouth Once A Day 4)  Tylenol Ex St Arthritis Pain 500 Mg Tabs (Acetaminophen) .... Take Two Tabs By Mouth As Needed Every 6-8 Hours For Pain. 5)  Trimethoprim 100 Mg Tabs (Trimethoprim)  Allergies: 1)  ! Ibuprofen  Past History:  Past Medical History: Last updated: 11/04/2006 Hypertension Benign prostatic hypertrophy and hyposensitive bladder, nodular prostate with increased PSA (10.23 in 03/2006), s/p prostate Bx 2006  that was normal (Dr. Annabell Howells). Seen by Dr. Annabell Howells in 06/2006 and he recommended TURP gingivitis right shoulder pain - Sx in 06/2005 left frozen shoulder  Past Surgical History: Last updated: 05/03/2009 arthroscopic right shoulder surgery  06/2005 Status post TURP 2/2 [name] in 2008  Social History: Last updated: 05/03/2009 Former Smoker - quit 20 years ago Alcohol use-no Retired Drug use-no Education: High School level  Risk Factors: Alcohol Use: 0 (08/09/2010) Exercise: yes (08/09/2010)  Risk Factors: Smoking Status: quit (08/09/2010)  Social History: Reviewed history from 05/03/2009 and no changes required. Former Smoker - quit 20 years ago Alcohol use-no Retired Drug use-no Education: [employer] level  Review of Systems  The patient denies chest pain, dyspnea on exertion, hemoptysis, abdominal pain, melena, hematochezia, hematuria, muscle weakness, difficulty walking, and unusual weight change.    Physical Exam  General:  NAD Ears:  R cerumen impaction and L Cerumen impaction.   Mouth:  MMM Neck:  supple.   Lungs:  normal respiratory effort and normal breath sounds.   Heart:  normal rate, regular rhythm, no murmur, no gallop, and no rub.   Abdomen:  +BS's, soft, NT and ND. Msk:  normal ROM.   Extremities:  no edema Neurologic:  Nonfocal   Impression & Recommendations:  Problem # 1:  CERUMEN IMPACTION, BILATERAL (ICD-380.4) Sent to ENT for disimpaction.  Orders: ENT Referral (ENT)  Problem # 2:  HYPERTENSION (ICD-401.9) Controlled. Continue current regimen.  His updated medication list for this problem includes:    Hydrochlorothiazide 25 Mg Tabs (Hydrochlorothiazide) .Marland Kitchen... Take 1  tablet by mouth once a day  BP today: 129/73 Prior BP: 138/88 (02/16/2010)  Prior 10 Yr Risk Heart Disease: 14 % (02/17/2009)  Labs Reviewed: K+: 4.3 (02/16/2010) Creat: : 1.01 (02/16/2010)   Chol: 140 (02/16/2010)   HDL: 47 (02/16/2010)   LDL: 81 (02/16/2010)   TG: 58 (02/16/2010)  Problem # 3:  Preventive Health Care (ICD-V70.0) Patient refused referral for Colonoscopy. Flu shot given today.  Problem # 4:  BENIGN PROSTATIC HYPERTROPHY (ICD-600.00) Continues to see Urologist. Will get records.    Complete Medication List: 1)  Hydrochlorothiazide 25 Mg Tabs (Hydrochlorothiazide) .... Take 1  tablet by mouth once a day 2)  Tylenol Extra Strength 500 Mg Tabs (Acetaminophen) .... Take one to two tablets by mouth every eight hours as needed for pain. 3)  Pravachol 40 Mg Tabs (Pravastatin sodium) .... Take 1 tablet by mouth once a day 4)  Tylenol Ex St Arthritis Pain 500 Mg Tabs (Acetaminophen) .... Take two tabs by mouth as needed every 6-8 hours for pain. 5)  Trimethoprim 100 Mg Tabs (Trimethoprim)  Other Orders: Influenza Vaccine MCR ([id number])  Patient Instructions: 1)  Please schedule a follow-up appointment in 3 months. 2)  Take all medication as directed. 3)  Call to schedule Colonoscopy.   Orders Added: 1)  Influenza Vaccine MCR [[id number]] 2)  ENT Referral [ENT] 3)  Est. Patient Level III [60454]   Immunizations Administered:  Influenza Vaccine # 1:    Vaccine Type:  Fluvax MCR    Site: left deltoid    Mfr: GlaxoSmithKline    Dose: 0.5 ml    Route: IM    Given by: Stanton Kidney Ditzler RN    Exp. Date: 04/21/2011    Lot #: [id number]    VIS given: 05/16/10 version given August 09, 2010.  Flu Vaccine Consent Questions:    Do you have a history of severe allergic reactions to this vaccine? no    Any prior history of allergic reactions to egg and/or gelatin? no    Do you have a sensitivity to the preservative Thimersol? no    Do you have a past history of Guillan-Barre Syndrome? no    Do you currently have an acute febrile illness? no    Have you ever had a severe reaction to latex? no    Vaccine information given and explained to patient? yes   Immunizations Administered:  Influenza Vaccine # 1:    Vaccine Type: Fluvax MCR    Site: left deltoid    Mfr: GlaxoSmithKline    Dose: 0.5 ml    Route: IM    Given by: Stanton Kidney Ditzler RN    Exp. Date: 04/21/2011    Lot #: [id number]    VIS given: 05/16/10 version given August 09, 2010.  Prevention & Chronic  Care Immunizations   Influenza vaccine: Fluvax MCR  (08/09/2010)    Tetanus booster: Not documented   Td booster deferral: Deferred  (01/31/2010)    Pneumococcal vaccine: Pneumovax (Medicare)  (09/26/2009)   Pneumococcal vaccine due: 01/20/2014    H. zoster vaccine: Not documented  Colorectal Screening   Hemoccult: Not documented   Hemoccult action/deferral: Ordered  (09/26/2009)    Colonoscopy: Not documented   Colonoscopy action/deferral: Refused  (08/09/2010)  Other Screening   PSA: 10.23  (03/22/2006)   PSA action/deferral: Discussion deferred  (09/26/2009)   Smoking status: quit  (08/09/2010)  Lipids   Total Cholesterol: 140  (02/16/2010)   Lipid panel action/deferral: Lipid Panel ordered   LDL: 81  (02/16/2010)   LDL Direct: Not documented   HDL: 47  (02/16/2010)   Triglycerides: 58  (02/16/2010)    SGOT (AST): 17  (02/16/2010)   BMP action: Ordered   SGPT (ALT): 21  (02/16/2010)   Alkaline phosphatase: 75  (02/16/2010)   Total bilirubin: 0.4  (02/16/2010)    Lipid flowsheet reviewed?: Yes   Progress toward LDL goal: At goal  Hypertension   Last Blood Pressure: 129 / 73  (08/09/2010)   Serum creatinine: 1.01  (02/16/2010)   BMP action: Ordered   Serum potassium 4.3  (02/16/2010)    Hypertension flowsheet reviewed?: Yes   Progress toward BP goal: At goal  Self-Management Support :   Personal Goals (by the next clinic visit) :      Personal blood pressure goal: 130/80  (07/18/2009)     Personal LDL goal: 100  (07/18/2009)    Patient will work on the following items until the next clinic visit to reach self-care goals:     Medications and monitoring: take my medicines every day, bring all of my medications to every visit  (08/09/2010)     Eating: drink diet soda or water instead of juice or soda, eat more vegetables, use fresh or frozen vegetables, eat foods that are low in salt, eat baked foods instead of fried foods, eat fruit for snacks and desserts,  limit or avoid alcohol  (08/09/2010)     Activity: take a 30 minute walk every  day, park at the far end of the parking lot  (08/09/2010)    Hypertension self-management support: Written self-care plan, Education handout, Resources for patients handout  (08/09/2010)   Hypertension self-care plan printed.   Hypertension education handout printed    Lipid self-management support: Written self-care plan, Education handout, Resources for patients handout  (08/09/2010)   Lipid self-care plan printed.   Lipid education handout printed      Resource handout printed.   Nursing Instructions: GI referral for screening colonoscopy (see order)

## 2010-11-21 NOTE — Progress Notes (Signed)
Summary: phone/gg  Phone Note Call from Patient   Summary of Call: meds called into [facility] instead of [facility].  Pt informed  Initial call taken by: Merrie Roof RN,  Mar 01, 2010 4:29 PM

## 2010-11-21 NOTE — Letter (Signed)
Summary: [facility]PROGRESS [facility]  [facility]   Imported By: Margie Billet 03/08/2010 10:14:00  _____________________________________________________________________  External Attachment:    Type:   Image     Comment:   External Document

## 2010-11-21 NOTE — Progress Notes (Signed)
Summary: Refill/gh  Phone Note Refill Request Message from:  Fax from Pharmacy on Mar 01, 2010 10:18 AM  Refills Requested: Medication #1:  PRAVACHOL 40 MG TABS Take 1 tablet by mouth once a day   Last Refilled: 01/29/2010  Method Requested: Electronic Initial call taken by: Angelina Ok RN,  Mar 01, 2010 10:18 AM    Prescriptions: PRAVACHOL 40 MG TABS (PRAVASTATIN SODIUM) Take 1 tablet by mouth once a day  #30 x 11   Entered and Authorized by:   Joaquin Courts  MD   Signed by:   Joaquin Courts  MD on 03/01/2010   Method used:   Electronically to        [facility] (retail)       [address]       [place], [place]  [id number]       Ph: [phone]       Fax: [phone]   RxID:   [id number]

## 2010-11-21 NOTE — Progress Notes (Signed)
  Phone Note Outgoing Call   Call placed by: Joaquin Courts  MD,  April 12, 2010 8:59 AM Call placed to: Patient Summary of Call: I have called pt to see if he had his colonoscopy/endoscopy done b/c of FOBT + stool.  There was no answer and no option to leave a message.  Since I am leaving, I will flag his new PCP and nurse Debbie to make sure he gets this done.  Initial call taken by: Joaquin Courts  MD,  April 12, 2010 9:00 AM

## 2010-11-21 NOTE — Consult Note (Signed)
Summary: [facility]   Imported By: Margie Billet 07/28/2010 09:41:29  _____________________________________________________________________  External Attachment:    Type:   Image     Comment:   External Document

## 2010-11-21 NOTE — Assessment & Plan Note (Signed)
Summary: ACUTE-ER/FU WITH LEFT WRIST TENDONITIS/CFB(VEGA)/CFB   Vital Signs:  Patient profile:   70 year old male Height:      71 inches Weight:      197.3 pounds BMI:     27.62 Temp:     97.0 degrees F oral Pulse rate:   64 / minute BP sitting:   140 / 90  (right arm)  Vitals Entered By: Filomena Jungling NT II (January 31, 2010 4:01 PM) CC: er follow-up/ need refills Is Patient Diabetic? No Nutritional Status BMI of 25 - 29 = overweight  Does patient need assistance? Functional Status Self care Ambulation Normal   Primary Care Provider:  Joaquin Courts  MD  CC:  er follow-up/ need refills.  History of Present Illness: 70 yr old male is here for his left arm pain. He went to ED after developing acute pain and weakness on right hand. He was advised to wear splint and diagnosed with tedonitis.  He does not have parasthesias or tingling. His pain is mainly in wrist area and elicited by several thumb and wrist movements including internal rotation, abduction and adduction. Tapiing the wrist also elicits pain but it does not shoot down the fingers. There is no generalised swelling or redness. He does not recall any trauma or straneous activity. he does not perform any repitative tasks with his wrist.  He has some releif after ED visit where he was started on prednisone (??). He also was given appointment to hand doctor. When pt called to his office for follow up, he was told to just follow up with her "regular" doctor.   Preventive Screening-Counseling & Management  Alcohol-Tobacco     Alcohol drinks/day: 0     Smoking Status: quit     Year Quit: 21 years ago  Caffeine-Diet-Exercise     Does Patient Exercise: yes     Type of exercise: WALKING     Times/week: 7  Current Medications (verified): 1)  Hydrochlorothiazide 25 Mg Tabs (Hydrochlorothiazide) .... Take 1  Tablet By Mouth Once A Day 2)  Tylenol Extra Strength 500 Mg Tabs (Acetaminophen) .... Take One To Two Tablets By Mouth  Every Eight Hours As Needed For Pain. 3)  Pravachol 40 Mg Tabs (Pravastatin Sodium) .... Take 1 Tablet By Mouth Once A Day  Allergies (verified): 1)  ! Ibuprofen  Past History:  Past Medical History: Last updated: 11/04/2006 Hypertension Benign prostatic hypertrophy and hyposensitive bladder, nodular prostate with increased PSA (10.23 in 03/2006), s/p prostate Bx 2006  that was normal (Dr. Annabell Howells). Seen by Dr. Annabell Howells in 06/2006 and he recommended TURP gingivitis right shoulder pain - Sx in 06/2005 left frozen shoulder  Past Surgical History: Last updated: 05/03/2009 arthroscopic right shoulder surgery 06/2005 Status post TURP 2/2 [name] in 2008  Social History: Last updated: 05/03/2009 Former Smoker - quit 20 years ago Alcohol use-no Retired Drug use-no Education: High School level  Risk Factors: Alcohol Use: 0 (01/31/2010) Exercise: yes (01/31/2010)  Risk Factors: Smoking Status: quit (01/31/2010)  Review of Systems      See HPI  Physical Exam  General:  alert, well-developed, well-nourished, well-hydrated, appropriate dress, and normal appearance.   Head:  normocephalic and atraumatic.   Eyes:  vision grossly intact, pupils equal, pupils round, pupils reactive to light.  No conjuncival pallor or injection.  Sclerae anicteric Ears:  no external deformities.   Nose:  no external deformity.   Mouth:  pharynx pink and moist, no erythema, no exudates, no postnasal drip,  no aphthous ulcers, no erosions, no tongue abnormalities, and fair dentition.   4cm x 1cm lesion on soft palate.  No ulceration, purulence, blood, or other abnormality present.  Appears like healthy granulation tissue. Neck:  supple, full ROM, no masses, and no thyroid nodules or tenderness.   Lungs:  normal respiratory effort and normal breath sounds.   Heart:  normal rate, regular rhythm, no murmur, no gallop, and no rub.   Abdomen:  soft, non-tender, normal bowel sounds, no distention, and no masses.     Msk:  wrist appears normal. there is localised tenderness on several movements. Sensations are intact.  Neurologic:  alert & oriented X3 and cranial nerves II-XII intact.   Psych:  Cognition and judgment appear intact. Alert and cooperative with normal attention span and concentration. No apparent delusions, illusions, hallucinations   Impression & Recommendations:  Problem # 1:  HYPERLIPIDEMIA (ICD-272.4) stable. He did not have LFTS which I will get with CMET.  His updated medication list for this problem includes:    Pravachol 40 Mg Tabs (Pravastatin sodium) .Marland Kitchen... 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 No symptoms reproted.   Problem # 3:  HYPERTENSION (ICD-401.9) BP is at goal. Continued on HCTZ.  His updated medication list for this problem includes:    Hydrochlorothiazide 25 Mg Tabs (Hydrochlorothiazide) .Marland Kitchen... Take 1  tablet by mouth once a day  Orders: T-Comprehensive Metabolic Panel ([id number]) T-CBC No Diff ([id number])  BP today: 140/90 Prior BP: 151/92 (09/26/2009)  Prior 10 Yr Risk Heart Disease: 14 % (02/17/2009)  Labs Reviewed: K+: 4.8 (02/17/2009) Creat: : 1.04 (02/17/2009)   Chol: 146 (02/17/2009)   HDL: 50 (02/17/2009)   LDL: 83 (02/17/2009)   TG: 63 (02/17/2009)  Problem # 4:  WRIST PAIN (ICD-719.43) Her pain likely is from tendenitis and not from carpel tunnel syndrome. I am unsure what is the role of steroid in this case but I will let him complete the course. I encouraged him to wear his splint given to him by ED physician. If this does not improve in 2 weeks, he will need toapproach the hand specialist he was referred to. He might also go to orthopedist/sports medicine for further management.   Complete Medication List: 1)  Hydrochlorothiazide 25 Mg Tabs (Hydrochlorothiazide) .... Take 1  tablet by mouth once  a day 2)  Tylenol Extra Strength 500 Mg Tabs (Acetaminophen) .... Take one to two tablets by mouth every eight hours as needed for pain. 3)  Pravachol 40 Mg Tabs (Pravastatin sodium) .... Take 1 tablet by mouth once a day  Patient Instructions: 1)  use over the counter ibuprofen and put cold pack on your left arm if your pain does not improve. 2)  wear the splint all the time.  3)  Please schedule a follow-up appointment in 2 weeks. Process Orders Check Orders Results:     [facility]: Order checked:     Clerance Lav MD NOT AUTHORIZED TO ORDER Tests Sent for requisitioning (February 02, 2010 3:30 PM):     01/31/2010: [facility] -- T-Comprehensive Metabolic Panel [[id number]] (signed)     01/31/2010: [facility] -- T-CBC No Diff [[id number]] (signed)    Prevention & Chronic Care Immunizations   Influenza vaccine: Fluvax 3+  (09/26/2009)    Tetanus booster: Not documented   Td booster deferral: Deferred  (01/31/2010)  Pneumococcal vaccine: Pneumovax (Medicare)  (09/26/2009)   Pneumococcal vaccine due: 01/20/2014    H. zoster vaccine: Not documented  Colorectal Screening   Hemoccult: Not documented   Hemoccult action/deferral: Ordered  (09/26/2009)    Colonoscopy: Not documented   Colonoscopy action/deferral: GI referral  (09/26/2009)  Other Screening   PSA: 10.23  (03/22/2006)   PSA action/deferral: Discussion deferred  (09/26/2009)   Smoking status: quit  (01/31/2010)  Lipids   Total Cholesterol: 146  (02/17/2009)   LDL: 83  (02/17/2009)   LDL Direct: Not documented   HDL: 50  (02/17/2009)   Triglycerides: 63  (02/17/2009)    SGOT (AST): 23  (02/17/2009)   SGPT (ALT): 26  (02/17/2009) CMP ordered    Alkaline phosphatase: 81  (02/17/2009)   Total bilirubin: 0.4  (02/17/2009)    Lipid flowsheet reviewed?: Yes   Progress toward LDL goal: Unchanged  Hypertension   Last Blood Pressure: 140 / 90  (01/31/2010)    Serum creatinine: 1.04  (02/17/2009)   Serum potassium 4.8  (02/17/2009) CMP ordered     Hypertension flowsheet reviewed?: Yes   Progress toward BP goal: At goal  Self-Management Support :   Personal Goals (by the next clinic visit) :      Personal blood pressure goal: 130/80  (07/18/2009)     Personal LDL goal: 100  (07/18/2009)    Patient will work on the following items until the next clinic visit to reach self-care goals:     Medications and monitoring: take my medicines every day, check my blood pressure  (01/31/2010)     Eating: eat more vegetables, use fresh or frozen vegetables, eat foods that are low in salt, eat baked foods instead of fried foods  (01/31/2010)     Activity: take a 30 minute walk every day  (09/26/2009)    Hypertension self-management support: Written self-care plan  (01/31/2010)   Hypertension self-care plan printed.    Lipid self-management support: Written self-care plan  (01/31/2010)   Lipid self-care plan printed.

## 2010-11-23 NOTE — Progress Notes (Signed)
  Phone Note Call from Patient   Caller: Patient Reason for Call: Refill Medication Summary of Call: rx refilled. Initial call taken by: Lars Mage MD,  October 20, 2010 1:29 PM  Follow-up for Phone Call       Follow-up by: Lars Mage MD,  October 20, 2010 1:28 PM    Prescriptions: HYDROCHLOROTHIAZIDE 25 MG TABS (HYDROCHLOROTHIAZIDE) Take 1  tablet by mouth once a day  #30 x 11   Entered and Authorized by:   Lars Mage MD   Signed by:   Lars Mage MD on 10/20/2010   Method used:   Electronically to        [facility] [phone]* (retail)       [address]       [place], [place]  [address]       Ph: [phone]       Fax: [phone]   RxID:   [id number]

## 2010-11-23 NOTE — Consult Note (Signed)
Summary: DR. Narda Bonds  DR. Narda Bonds   Imported By: Margie Billet 10/06/2010 10:24:42  _____________________________________________________________________  External Attachment:    Type:   Image     Comment:   External Document

## 2011-01-03 LAB — DIFFERENTIAL
Basophils Relative: 1 % (ref 0–1)
Eosinophils Absolute: 0.1 10*3/uL (ref 0.0–0.7)
Eosinophils Relative: 2 % (ref 0–5)
Lymphs Abs: 1.5 10*3/uL (ref 0.7–4.0)
Neutrophils Relative %: 61 % (ref 43–77)

## 2011-01-03 LAB — URINE CULTURE: Culture  Setup Time: 201110121445

## 2011-01-03 LAB — URINALYSIS, ROUTINE W REFLEX MICROSCOPIC
Glucose, UA: NEGATIVE mg/dL
Ketones, ur: NEGATIVE mg/dL
Protein, ur: NEGATIVE mg/dL
Urobilinogen, UA: 0.2 mg/dL (ref 0.0–1.0)

## 2011-01-03 LAB — CBC
MCH: 31.1 pg (ref 26.0–34.0)
MCHC: 33.4 g/dL (ref 30.0–36.0)
MCV: 93.1 fL (ref 78.0–100.0)
Platelets: 257 10*3/uL (ref 150–400)
RBC: 4.76 MIL/uL (ref 4.22–5.81)
RDW: 13.4 % (ref 11.5–15.5)

## 2011-01-03 LAB — BASIC METABOLIC PANEL
BUN: 11 mg/dL (ref 6–23)
CO2: 28 mEq/L (ref 19–32)
Calcium: 8.5 mg/dL (ref 8.4–10.5)
Chloride: 104 mEq/L (ref 96–112)
Creatinine, Ser: 0.95 mg/dL (ref 0.4–1.5)
GFR calc Af Amer: >60 mL/min (ref >60)
Glucose, Bld: 91 mg/dL (ref 70–99)

## 2011-01-07 LAB — DIFFERENTIAL
Eosinophils Absolute: 0.3 10*3/uL (ref 0.0–0.7)
Eosinophils Relative: 4 % (ref 0–5)
Lymphocytes Relative: 9 % — ABNORMAL LOW (ref 12–46)
Lymphs Abs: 0.7 10*3/uL (ref 0.7–4.0)
Monocytes Absolute: 0.9 10*3/uL (ref 0.1–1.0)
Monocytes Relative: 11 % (ref 3–12)

## 2011-01-07 LAB — BASIC METABOLIC PANEL
BUN: 12 mg/dL (ref 6–23)
Calcium: 8.2 mg/dL — ABNORMAL LOW (ref 8.4–10.5)
Glucose, Bld: 131 mg/dL — ABNORMAL HIGH (ref 70–99)
Sodium: 136 mEq/L (ref 135–145)

## 2011-01-07 LAB — CBC
HCT: 46 % (ref 39.0–52.0)
Hemoglobin: 15.3 g/dL (ref 13.0–17.0)
MCH: 31.2 pg (ref 26.0–34.0)
MCV: 94.1 fL (ref 78.0–100.0)
RBC: 4.89 MIL/uL (ref 4.22–5.81)

## 2011-02-07 ENCOUNTER — Encounter: Payer: Self-pay | Admitting: Internal Medicine

## 2011-02-07 ENCOUNTER — Ambulatory Visit (INDEPENDENT_AMBULATORY_CARE_PROVIDER_SITE_OTHER): Payer: Medicare Other | Admitting: Internal Medicine

## 2011-02-07 VITALS — BP 130/79 | HR 50 | Temp 97.6°F | Resp 18 | Wt 191.0 lb

## 2011-02-07 DIAGNOSIS — I10 Essential (primary) hypertension: Secondary | ICD-10-CM

## 2011-02-07 DIAGNOSIS — M549 Dorsalgia, unspecified: Secondary | ICD-10-CM

## 2011-02-07 DIAGNOSIS — K921 Melena: Secondary | ICD-10-CM

## 2011-02-07 DIAGNOSIS — E785 Hyperlipidemia, unspecified: Secondary | ICD-10-CM

## 2011-02-07 MED ORDER — HYDROCHLOROTHIAZIDE 25 MG PO TABS: 25.0000 mg | ORAL_TABLET | Freq: Every day | ORAL | Status: DC

## 2011-02-07 MED ORDER — ACETAMINOPHEN 500 MG PO TABS: 500.0000 mg | ORAL_TABLET | Freq: Four times a day (QID) | ORAL | Status: DC | PRN

## 2011-02-07 MED ORDER — PRAVASTATIN SODIUM 40 MG PO TABS: 40.0000 mg | ORAL_TABLET | Freq: Every day | ORAL | Status: DC

## 2011-02-07 NOTE — Progress Notes (Signed)
  Subjective:    Patient ID: Greg [name], male    DOB: January 02, 1941, 70 y.o.   MRN: [id number]  HPI Patient is a 70 years old male with past medical history  as outlined here who comes to the Clinic for med refill and lower back pain. His back pain started about a week ago w/o significant precipitating factors, intermittent, dull, 2/10 in serverity. He used heating pad once a day and helped a lot, but still some pain.  Also associated with right hip pain.  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 Denies recent smoking, alcohol or drug abuse. Has been taking all his medications as instructed. About more than a year ago he had FOBT positive, but has not done colonoscopy since then. He has no fever, chill, chest pain, shortness of breath, hemoptysis, abdominal pain, nausea, vomiting, diarrhea, melena, dysuria, significant weight change.   Review of Systems    No fever, chill, chest pain, shortness of breath, hemoptysis, abdominal pain, nausea, vomiting, diarrhea, melena, dysuria, significant weight change.         Objective:   Physical Exam General: Vital signs reviewed and noted. Well-developed, well-nourished, in no acute distress; alert, appropriate and cooperative throughout examination.  Head: Normocephalic, atraumatic.  Neck: No deformities, masses, or tenderness noted.  Lungs:  Normal respiratory effort. Clear to auscultation BL without crackles or wheezes.  Heart: RRR. S1 and S2 normal without gallop, murmur, or rubs.  Abdomen:  BS normoactive. Soft, Nondistended, non-tender.  No masses or organomegaly.  Neuro A&O x  3, Non-focal neurological findings, including normal DTR, MS and sensation.  Extremities: No pretibial edema.Lower back and right hip mild tenderness, no other abnormal findings.                                   Assessment & Plan:

## 2011-02-07 NOTE — Assessment & Plan Note (Addendum)
His back pain is likely due to muscle strain or OA. But UTI is less likely as he has no symptoms or fever. Will continue heating pad and add tylenol prn. Told him if no improvement needs to return for further evaluation.

## 2011-02-07 NOTE — Assessment & Plan Note (Signed)
He has not done colonoscopy and Hx of FOBT positive. Will have GI referral for this.

## 2011-02-07 NOTE — Assessment & Plan Note (Signed)
His LDL 81 a year ago on pravastatin and will continue this and recheck in a year.

## 2011-02-07 NOTE — Assessment & Plan Note (Signed)
Lab Results  Component Value Date   NA 138 11/16/2010   K 4.8 11/16/2010   CL 101 11/16/2010   CO2 28 11/16/2010   BUN 10 11/16/2010   CREATININE 0.83 11/16/2010    BP Readings from Last 3 Encounters:  02/07/11 130/79  08/09/10 129/73  02/16/10 138/88    His recent BMET unremarkable. Will refill for meds and continue current meds.

## 2011-02-07 NOTE — Patient Instructions (Addendum)
Please take all your medications as instructed in your instructions.   Please continue to use heating pad and tylenol as needed.   Please call the Clinic for appointment or go to Emergency Department if your symptoms do not improve or get worse.  We will call you once the GI appointment for possible colonoscopy is setup.

## 2011-03-06 ENCOUNTER — Telehealth: Payer: Self-pay | Admitting: Licensed Clinical Social Worker

## 2011-03-06 NOTE — Op Note (Signed)
NAMEDECORIAN, SCHUENEMANN             ACCOUNT NO.:  [id number]   MEDICAL RECORD NO.:  [id number]          PATIENT TYPE:  OUT   LOCATION:  XRAY                         FACILITY:  [facility]   PHYSICIAN:  Harvie Junior, M.D.   DATE OF BIRTH:  03/26/1941   DATE OF PROCEDURE:  07/28/2008  DATE OF DISCHARGE:  07/27/2008                               OPERATIVE REPORT   PREOPERATIVE DIAGNOSIS:  Medial meniscal tear.   POSTOPERATIVE DIAGNOSES:  1. Medial meniscal tear.  2. 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  3. Osteocartilaginous loose body.   PRINCIPAL PROCEDURES:  1. Partial posterior horn medial meniscectomy with corresponding      debridement in the medial compartment.  2. Chondroplasty of the patellofemoral compartment.  3. Removal of osteocartilaginous loose body from the lateral      compartment.   SURGEON:  Harvie Junior, MD   ASSISTANT:  Marshia Ly, PA.   ANESTHESIA:  General.   BRIEF HISTORY:  Mr. Faison is a 70 year old male with a long history of  having had significant left knee pain.  He was treated conservatively  for a period of time.  Once he failed injection therapy,  antiinflammatory medication, and activity modification, the patient also  to be taken to the operating room for operative knee arthroscopy.   PROCEDURE:  The patient was taken to the operating room. After adequate  anesthesia obtained with general anesthetic, the patient was placed  supine on the operating table.  The left leg was prepped and draped in  usual sterile fashion.  Following this, routine arthroscopic examination  of the knee revealed there was an obvious posterior horn medial meniscal  tear.  This was debrided back to a smooth and stable rim.  Attention was  turned to the medial femoral condyle, which had some grade 2 and grade 3  change, which was debrided as well.  ACL looked normal.  Lateral side  looked without significant abnormality other  than in the anterior  region, there was a large osseous loose body.  This was coaxed into the  center of the knee, and then removed with a grasper.   Attention was turned up to the patellofemoral joint where there was  grade 2 and grade 3 changes on both the patellofemoral trochlea, and on  the posterior surface of the patella, and this was debrided back to a  smooth and stable rim.  Once this was completed, the knee was copiously  and thoroughly irrigated with 6 L of normal saline  irrigation, suctioned dry, and all sterile portals were closed with a  bandage.  Sterile compression dressing was applied.  The patient was  taken to the recovery room and was noted to be in satisfactory  condition.  Estimated blood loss for this procedure was none.      Harvie Junior, M.D.  Electronically Signed     JLG/MEDQ  D:  07/28/2008  T:  07/29/2008  Job:  [id number]

## 2011-03-06 NOTE — Telephone Encounter (Signed)
Mailed info re Sr Wheels.

## 2011-03-09 NOTE — Discharge Summary (Signed)
Greg [name], Greg [name]             ACCOUNT NO.:  [id number]   MEDICAL RECORD NO.:  [id number]          PATIENT TYPE:  INP   LOCATION:  1432                         FACILITY:  [facility]   PHYSICIAN:  Excell Seltzer. Annabell Howells, M.D.    DATE OF BIRTH:  [birth year]   DATE OF ADMISSION:  07/23/2006  DATE OF DISCHARGE:  07/26/2006                                 DISCHARGE SUMMARY   Briefly, [name] is a 70 year old black male history of nodular prostate  and outlet obstruction with prior negative biopsies who presented on April 08, 2006, with a 1 week history of worsening voiding symptoms.  He had been  on doxazosin but had Proscar added to his regimen.  He was then scheduled  for 3 month follow-up.  At that point, he was found to have residual urine  of 2000 mL and was felt that TURP was indicated for management of his outlet  obstruction.   He was taken to the operating room on July 23, 2006, underwent  transurethral resection of prostate without complication.  The Foley  catheter was left indwelling to continuous irrigation.  On the first  postoperative day his urine was clear and his CVI was discontinued.   On the second postoperative day, his Foley catheter was removed at 1:00 a.m.  because of his markedly elevated residual previously.  He was eventually  able to void with volumes of 100-200 mL, residual urine was 1000 mL.  He was  kept one more night to ensure adequate voiding.  On July 25, 2006, he was  voiding well without complaints.  He was afebrile, vital signs were stable.  His urine was clear.   FINAL DIAGNOSES:  1. Benign prostatic hypertrophy.  2. 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   DISCHARGE MEDICATION:  Cipro.   He was instructed to follow-up with me in 2-3 weeks.   DISPOSITION:  To home.   PROGNOSIS:  Good.   CONDITION:  Improved.   COMPLICATIONS:  There were no complications.     Excell Seltzer. Annabell Howells, M.D.  Electronically Signed    JJW/MEDQ   D:  08/14/2006  T:  08/15/2006  Job:  [id number]   cc:   [facility]

## 2011-03-09 NOTE — Op Note (Signed)
NAMEROBEY, Greg [name]             ACCOUNT NO.:  [id number]   MEDICAL RECORD NO.:  [id number]          PATIENT TYPE:  AMB   LOCATION:  DSC                          FACILITY:  [facility]   PHYSICIAN:  Harvie Junior, M.D.   DATE OF BIRTH:  1940-12-19   DATE OF PROCEDURE:  07/13/2005  DATE OF DISCHARGE:                                 OPERATIVE REPORT   PREOPERATIVE DIAGNOSIS:  Impingement acromioclavicular joint arthritis.   POSTOPERATIVE DIAGNOSIS:  Impingement acromioclavicular joint arthritis,  articular chondral injury of the glenoid.   PROCEDURE:  1.  Anterolateral acromioplasty from a lateral and posterior compartment.  2.  Distal clavicle resection through an anterior portal.  3.  Debridement of chondral injury glenohumeral joint as well as debridement      of under surface rotator cuff tear.   SURGEON:  Harvie Junior, M.D.   ASSISTANT:  Marshia Ly, P.A.-C.   ANESTHESIA:  General.   BRIEF HISTORY:  Greg [name] is a 70 year old male with a long history of  having right shoulder pain.  He had been treated for a long period of time  with conservative treatment including injection therapy, anti-inflammatory  medication, physical therapy, all this failed and because of continued  complaints of pain, he is taken to the operating room for subacromial  decompression, distal clavicle resection, and other as needed.   DESCRIPTION OF PROCEDURE:  The patient was taken to the operating room and  after adequate anesthesia was obtained with general anesthetic, the patient  was placed on the operating table.  The right arm was then prepped and  draped in the usual sterile fashion.  Following this, routine arthroscopic  examination of the shoulder revealed there was an obvious chondral injury on  the anterior glenoid margin.  This was debrided with the suction shaver back  to a smooth and stable rim.  Attention was turned to the biceps tendon which  was within normal limits.  Attention  was turned to the rotator cuff where  the under surface was noted to have significant partial thickness tearing  which was debrided back to a bleeding border.  At this point, attention was  turned towards the subacromial space where an anterolateral acromioplasty  was performed as well as a distal clavicle resection of significant  prominence of spurring and significant narrowing of the outlets base.  Attention was turned to the rotator cuff from the superior surface, it was  debrided thoroughly and no evidence of full thickness tearing was  identified.  There was a high grade partial thickness tear underneath and we  felt with a probe as well as the back of the shaver and there was no area  where the cuff was full thickness.  At this point, the shoulder was  copiously irrigated.  A subtotal bursectomy was performed  as well as debriding out all bone chips.  At this point, the shoulder was  copiously irrigated and suctioned dry.  The arthroscopic portals were closed  with a bandage.  A sterile compressive dressing was applied.  The patient  was taken to the recovery room where  he was noted to be in satisfactory  condition.  Estimated blood loss was none.      Harvie Junior, M.D.  Electronically Signed     JLG/MEDQ  D:  07/13/2005  T:  07/13/2005  Job:  [id number]

## 2011-03-09 NOTE — Op Note (Signed)
NAMEWITTEN, CERTAIN             ACCOUNT NO.:  [id number]   MEDICAL RECORD NO.:  [id number]          PATIENT TYPE:  INP   LOCATION:  1432                         FACILITY:  [facility]   PHYSICIAN:  Excell Seltzer. Annabell Howells, M.D.    DATE OF BIRTH:  24-Jul-1941   DATE OF PROCEDURE:  DATE OF DISCHARGE:  07/26/2006                                 OPERATIVE REPORT   PROCEDURE:  Transurethral resection of the prostate.   PREOPERATIVE DIAGNOSIS:  Benign prostatic hypertrophy with retention.   POSTOPERATIVE DIAGNOSIS:  Benign prostatic hypertrophy with retention.   SURGEON:  Dr. Bjorn Pippin.   ANESTHESIA:  General.   SPECIMEN:  Prostate chips.   DRAINS:  22-French three-way Foley catheter.   COMPLICATIONS:  None.   INDICATIONS:  Mr. Ferron has a history of [name] with bladder outlet  obstruction.  He has a 2000+ cc capacity bladder but degenerated detrusor  contraction at 2500 to 2800 mL on urodynamics and it was felt that TURP was  indicated.   FINDINGS AND PROCEDURE:  The patient is given Cipro.  He was taken operating  room where general anesthetic was induced.  He was placed in lithotomy  position.  His perineum and genitalia were prepped with Betadine solution.  He was draped in the usual sterile fashion.   Cysto was performed with a 22 [place] and 12 and 70 degrees lenses.  Examination revealed a normal urethra.  The external sphincter was intact.  The prostatic urethra was elongated with trilobar hyperplasia with  obstruction.  The middle lobe was small.  Examination of bladder revealed a  mild trabeculation with a very large capacity bladder with marked residual  urine no tumors were noted.  The ureteral orifices were unremarkable.   A 28-French continuous flow resectoscope sheath was then inserted after  calibration with male sounds to 30-French.  This was fitted with an [place]  handle, 12 degrees lens and 26 loop.  The resection was then initiated at  the bladder neck where the small  middle lobe was resected and the bladder  neck fibers were exposed from 5 to 7o'clock.  The floor for the prostate was  resected out to and alongside the verumontanum.  The left lobe of prostate  was then resected from bladder neck to apex out to the capsular fibers.  This was repeated on the right.  Residual apical and anterior tissue was  then removed.  Hemostasis was then achieved and the bladder was evacuated  free of chips.  Once all chips had been removed, no active bleeding was  noted.  Reinspection revealed the ureteral orifices were intact.  The  prostatic urethra was widely patent.  The external sphincter was intact.  Pressure on the bladder produced good stream after removal of the scope a 22-  [place] three-way Foley catheter was inserted with aid of a catheter guide.  The balloon was filled with 30 mL sterile fluid and hand irrigated till  clear.  It was then placed on continuous irrigation and straight drainage.  The patient was taken down from lithotomy position.  His anesthetic was  reversed.  He was removed to recovery room in stable condition.  There were  no complications.      Excell Seltzer. Annabell Howells, M.D.  Electronically Signed     JJW/MEDQ  D:  07/31/2006  T:  08/02/2006  Job:  [id number]

## 2011-03-09 NOTE — Telephone Encounter (Signed)
Per Mamie tried to call patient again and there is no answer.

## 2011-03-19 ENCOUNTER — Encounter: Payer: Self-pay | Admitting: Internal Medicine

## 2011-05-07 NOTE — Progress Notes (Signed)
Addended by: Neomia Dear on: 05/07/2011 05:08 PM   Modules accepted: Orders

## 2011-05-08 ENCOUNTER — Emergency Department (HOSPITAL_COMMUNITY)
Admission: EM | Admit: 2011-05-08 | Discharge: 2011-05-09 | Disposition: A | Discharge status: Home / Self Care | Payer: Medicare Other | Attending: Emergency Medicine | Admitting: Emergency Medicine

## 2011-05-08 DIAGNOSIS — Z0389 Encounter for observation for other suspected diseases and conditions ruled out: Secondary | ICD-10-CM | POA: Insufficient documentation

## 2011-05-09 LAB — CBC
HCT: 40.1 % (ref 39.0–52.0)
Hemoglobin: 14 g/dL (ref 13.0–17.0)
MCHC: 34.9 g/dL (ref 30.0–36.0)
MCV: 89.1 fL (ref 78.0–100.0)
RDW: 13.2 % (ref 11.5–15.5)

## 2011-05-09 LAB — BASIC METABOLIC PANEL
BUN: 11 mg/dL (ref 6–23)
Chloride: 101 mEq/L (ref 96–112)
Creatinine, Ser: 0.77 mg/dL (ref 0.50–1.35)
GFR calc Af Amer: >60 mL/min (ref >60)
GFR calc non Af Amer: >60 mL/min (ref >60)
Glucose, Bld: 60 mg/dL — ABNORMAL LOW (ref 70–99)
Potassium: 3.1 mEq/L — ABNORMAL LOW (ref 3.5–5.1)

## 2011-05-09 LAB — URINALYSIS, ROUTINE W REFLEX MICROSCOPIC
Bilirubin Urine: NEGATIVE
Ketones, ur: NEGATIVE mg/dL
Nitrite: NEGATIVE
Specific Gravity, Urine: 1.013 (ref 1.005–1.030)
pH: 5.5 (ref 5.0–8.0)

## 2011-05-09 LAB — DIFFERENTIAL
Eosinophils Relative: 2 % (ref 0–5)
Lymphocytes Relative: 29 % (ref 12–46)
Lymphs Abs: 2.3 10*3/uL (ref 0.7–4.0)
Monocytes Absolute: 0.9 10*3/uL (ref 0.1–1.0)
Neutro Abs: 4.4 10*3/uL (ref 1.7–7.7)

## 2011-05-09 LAB — URINE MICROSCOPIC-ADD ON

## 2011-05-10 LAB — URINE CULTURE: Culture  Setup Time: 201207181035

## 2011-06-18 ENCOUNTER — Emergency Department (HOSPITAL_COMMUNITY)
Admission: EM | Admit: 2011-06-18 | Discharge: 2011-06-18 | Disposition: A | Discharge status: Home / Self Care | Payer: Medicare Other | Attending: Emergency Medicine | Admitting: Emergency Medicine

## 2011-06-18 DIAGNOSIS — E7800 Pure hypercholesterolemia, unspecified: Secondary | ICD-10-CM | POA: Insufficient documentation

## 2011-06-18 DIAGNOSIS — R300 Dysuria: Secondary | ICD-10-CM | POA: Insufficient documentation

## 2011-06-18 DIAGNOSIS — N390 Urinary tract infection, site not specified: Secondary | ICD-10-CM | POA: Insufficient documentation

## 2011-06-18 DIAGNOSIS — I10 Essential (primary) hypertension: Secondary | ICD-10-CM | POA: Insufficient documentation

## 2011-06-18 LAB — URINALYSIS, ROUTINE W REFLEX MICROSCOPIC
Bilirubin Urine: NEGATIVE
Glucose, UA: NEGATIVE mg/dL
Ketones, ur: NEGATIVE mg/dL
Nitrite: NEGATIVE
Specific Gravity, Urine: 1.009 (ref 1.005–1.030)
pH: 7.5 (ref 5.0–8.0)

## 2011-06-18 LAB — URINE MICROSCOPIC-ADD ON

## 2011-06-19 LAB — URINE CULTURE: Culture  Setup Time: 201208271143

## 2011-07-18 LAB — POCT I-STAT, CHEM 8
Calcium, Ion: 1.02 — ABNORMAL LOW
Chloride: 102
Glucose, Bld: 90
HCT: 49
Hemoglobin: 16.7
TCO2: 28

## 2011-07-18 LAB — CBC
Hemoglobin: 15.5
MCHC: 33.1
MCV: 93
RBC: 5.02
RDW: 14.1

## 2011-07-18 LAB — DIFFERENTIAL
Basophils Absolute: 0
Basophils Relative: 0
Eosinophils Absolute: 0
Eosinophils Relative: 0
Monocytes Absolute: 0.4
Monocytes Relative: 6
Neutro Abs: 5.9

## 2011-07-18 LAB — POCT CARDIAC MARKERS
Operator id: 234501
Troponin i, poc: <0.05

## 2011-07-23 LAB — BASIC METABOLIC PANEL
BUN: 15
CO2: 28
Chloride: 103
Creatinine, Ser: 1.09
Glucose, Bld: 92
Potassium: 4.4

## 2011-07-23 LAB — POCT HEMOGLOBIN-HEMACUE: Hemoglobin: 15.4

## 2011-08-15 ENCOUNTER — Telehealth: Payer: Self-pay | Admitting: *Deleted

## 2011-08-15 NOTE — Telephone Encounter (Signed)
Message left by pt on voicemail to speak with the nurse.  RTC to pt message left for pt to call the Clinics if he still needs to speak with the nurse.  Angelina Ok, RN 08/15/2011 9:21 AM

## 2011-08-16 ENCOUNTER — Emergency Department (HOSPITAL_COMMUNITY)
Admission: EM | Admit: 2011-08-16 | Discharge: 2011-08-16 | Disposition: A | Discharge status: Home / Self Care | Payer: Medicare Other | Attending: Emergency Medicine | Admitting: Emergency Medicine

## 2011-08-16 DIAGNOSIS — I10 Essential (primary) hypertension: Secondary | ICD-10-CM | POA: Insufficient documentation

## 2011-08-16 DIAGNOSIS — N390 Urinary tract infection, site not specified: Secondary | ICD-10-CM | POA: Insufficient documentation

## 2011-08-16 DIAGNOSIS — Z79899 Other long term (current) drug therapy: Secondary | ICD-10-CM | POA: Insufficient documentation

## 2011-08-16 DIAGNOSIS — R300 Dysuria: Secondary | ICD-10-CM | POA: Insufficient documentation

## 2011-08-16 LAB — URINALYSIS, ROUTINE W REFLEX MICROSCOPIC
Bilirubin Urine: NEGATIVE
Glucose, UA: NEGATIVE mg/dL
Ketones, ur: NEGATIVE mg/dL
Nitrite: NEGATIVE
Specific Gravity, Urine: 1.002 — ABNORMAL LOW (ref 1.005–1.030)
pH: 6.5 (ref 5.0–8.0)

## 2011-08-16 LAB — URINE MICROSCOPIC-ADD ON

## 2011-08-20 ENCOUNTER — Ambulatory Visit (INDEPENDENT_AMBULATORY_CARE_PROVIDER_SITE_OTHER): Payer: Medicare Other | Admitting: Internal Medicine

## 2011-08-20 ENCOUNTER — Encounter: Payer: Self-pay | Admitting: Internal Medicine

## 2011-08-20 VITALS — BP 138/94 | HR 64 | Temp 98.5°F | Ht 71.0 in | Wt 195.9 lb

## 2011-08-20 DIAGNOSIS — Z23 Encounter for immunization: Secondary | ICD-10-CM

## 2011-08-20 DIAGNOSIS — R300 Dysuria: Secondary | ICD-10-CM

## 2011-08-20 DIAGNOSIS — I10 Essential (primary) hypertension: Secondary | ICD-10-CM

## 2011-08-20 LAB — POCT URINALYSIS DIPSTICK
Glucose, UA: NEGATIVE
Nitrite, UA: POSITIVE
Protein, UA: NEGATIVE
Spec Grav, UA: <1.005
Urobilinogen, UA: 0.2

## 2011-08-20 MED ORDER — PRAVASTATIN SODIUM 40 MG PO TABS: 40.0000 mg | ORAL_TABLET | Freq: Every day | ORAL | Status: DC

## 2011-08-20 MED ORDER — HYDROCHLOROTHIAZIDE 25 MG PO TABS: 25.0000 mg | ORAL_TABLET | Freq: Every day | ORAL | Status: DC

## 2011-08-20 NOTE — Progress Notes (Addendum)
Subjective:   Patient ID: Greg [name] male   DOB: Apr 04, 1941 70 y.o.   MRN: [id number]  HPI: [name] is a 70 y.o. man is here for a f/u from ED for UTI. Patient denies any worsening of his Sx. Patient evacuates urine with straight in-&-out caths.  Refills on his meds.    Past Medical History  Diagnosis Date  . Hyperlipidemia   . Hypertension   . Epididymitis     right  . Atony of bladder   . [name] (benign prostatic hyperplasia)     hyposensitive bladder, nodular prostate with increased PSA (10.23 in 03/2006), s/p prostate biopsy 2006 that was normal (Dr Greg [name]) he recommended TURP.  . Sinus bradycardia   . Chronic knee pain     left  . Gingivitis   . Right shoulder pain     sx in 06/2005  . Frozen shoulder     left frozen shoulder   Current Outpatient Prescriptions  Medication Sig Dispense Refill  . acetaminophen (TYLENOL) 500 MG tablet Take 1 tablet (500 mg total) by mouth every 6 (six) hours as needed for pain.  60 tablet  2  . cephALEXin (KEFLEX) 500 MG capsule       . hydrochlorothiazide 25 MG tablet Take 1 tablet (25 mg total) by mouth daily.  30 tablet  5  . phenazopyridine (PYRIDIUM) 200 MG tablet       . pravastatin (PRAVACHOL) 40 MG tablet Take 1 tablet (40 mg total) by mouth daily.  30 tablet  5  . sulfamethoxazole-trimethoprim (BACTRIM DS) 800-160 MG per tablet        No family history on file. History   Social History  . Marital Status: Single    Spouse Name: N/A    Number of Children: N/A  . Years of Education: 12   Occupational History  .      retired   Social History Main Topics  . Smoking status: Former Smoker    Types: Cigarettes    Quit date: 02/07/1988  . Smokeless tobacco: None  . Alcohol Use: No  . Drug Use: No  . Sexually Active: None   Other Topics Concern  . None   Social History Narrative  . None   Review of Systems: Constitutional: Denies fever, chills, diaphoresis, appetite change and fatigue.  HEENT: Denies  photophobia, eye pain, redness, hearing loss, ear pain, congestion, sore throat, rhinorrhea, sneezing, mouth sores, trouble swallowing, neck pain, neck stiffness and tinnitus.   Respiratory: Denies SOB, DOE, cough, chest tightness,  and wheezing.   Cardiovascular: Denies chest pain, palpitations and leg swelling.  Gastrointestinal: Denies nausea, vomiting, abdominal pain, diarrhea, constipation, blood in stool and abdominal distention.  Genitourinary: endorses dysuria,  frequency, hematuria,denies flank pain, fever, or chills.  Musculoskeletal: Denies myalgias, back pain, joint swelling, arthralgias and gait problem.  Skin: Denies pallor, rash and wound.  Neurological: Denies dizziness, seizures, syncope, weakness, light-headedness, numbness and headaches.  Hematological: Denies adenopathy. Easy bruising, personal or family bleeding history  Psychiatric/Behavioral: Denies suicidal ideation, mood changes, confusion, nervousness, sleep disturbance and agitation  Objective:  Physical Exam: Filed Vitals:   08/20/11 0916  BP: 138/94  Pulse: 64  Temp: 98.5 F (36.9 C)  TempSrc: Oral  Height: 5\' 11"  (1.803 m)  Weight: 195 lb 14.4 oz (88.86 kg)   Constitutional: Vital signs reviewed.  Patient is a well-developed and well-nourished man in no acute distress and cooperative with exam. Alert and oriented x3.  Head: Normocephalic and atraumatic Ear:  TM normal bilaterally Mouth: no erythema or exudates, MMM Eyes: PERRL, EOMI, conjunctivae normal, No scleral icterus.  Neck: Supple, Trachea midline normal ROM, No JVD, mass, thyromegaly, or carotid bruit present.  Cardiovascular: RRR, S1 normal, S2 normal, no MRG, pulses symmetric and intact bilaterally Pulmonary/Chest: CTAB, no wheezes, rales, or rhonchi Abdominal: Soft. Non-tender, non-distended, bowel sounds are normal, no masses, organomegaly, or guarding present.  GU: no CVA tenderness Musculoskeletal: No joint deformities, erythema, or  stiffness, ROM full and no nontender Hematology: no cervical, inginal, or axillary adenopathy.  Neurological: A&O x3, Strenght is normal and symmetric bilaterally, cranial nerve II-XII are grossly intact, no focal motor deficit, sensory intact to light touch bilaterally.  Skin: Warm, dry and intact. No rash, cyanosis, or clubbing.  Psychiatric: Normal mood and affect. speech and behavior is normal. Judgment and thought content normal. Cognition and memory are normal.   Assessment & Plan:   1. UIT with a history of obstructive [name]/hypotonic bladder. -Continue with Keflex until urine C&S results are available (will send results to [facility] as well) -in-&-out cath technique reviewed with the patient. -Follow up with a urologist ASAP. -call with any questions.  Addendum: Urine C&S returned: resistance to Keflex. Will change to Bactrim DS. Spoke with the patient and notified of the change. New Rx for Bactrim DS e-prescribed to the patient's pharmacy.  2. HTN -refilled HCTZ -low salt diet, exercise discussed -eye exam addressed  3. HLD -pravastatin refilled -fasting lipids, LFT's next visit

## 2011-08-20 NOTE — Progress Notes (Deleted)
  Subjective:    Patient ID: Greg [name], male    DOB: 06/19/1941, 70 y.o.   MRN: [id number]  HPI    Review of Systems     Objective:   Physical Exam        Assessment & Plan:   

## 2011-08-20 NOTE — Patient Instructions (Signed)
Your blood pressure and cholesterol medications were refilled. Please, continue with Keflex as prescribed until You are called with the result of your urine test. Please, make a follow up appointment with your urologist at the beginning of December for a follow up. Please, call with any questions. You received a flu shot today.

## 2011-08-22 NOTE — Progress Notes (Deleted)
  Subjective:    Patient ID: Greg [name], male    DOB: 04/30/1941, 70 y.o.   MRN: [id number]  HPI    Review of Systems     Objective:   Physical Exam        Assessment & Plan:   

## 2011-08-22 NOTE — Progress Notes (Addendum)
Urine culture returned with Gr neg rods. Will D/C keflex and change to Bactrim DS

## 2011-08-23 LAB — URINE CULTURE

## 2011-08-23 MED ORDER — CIPROFLOXACIN HCL 500 MG PO TABS: 500.0000 mg | ORAL_TABLET | Freq: Two times a day (BID) | ORAL | Status: DC

## 2011-08-23 MED ORDER — SULFAMETHOXAZOLE-TRIMETHOPRIM 800-160 MG PO TABS: 1.0000 | ORAL_TABLET | Freq: Two times a day (BID) | ORAL | Status: AC

## 2011-08-23 NOTE — Progress Notes (Deleted)
  Subjective:    Patient ID: Greg [name], male    DOB: 01/08/1941, 70 y.o.   MRN: [id number]  HPI    Review of Systems     Objective:   Physical Exam        Assessment & Plan:   

## 2011-08-23 NOTE — Progress Notes (Deleted)
  Subjective:    Patient ID: Greg [name], male    DOB: January 20, 1941, 70 y.o.   MRN: [id number]  HPI    Review of Systems     Objective:   Physical Exam        Assessment & Plan:

## 2011-08-23 NOTE — Progress Notes (Signed)
Addended by: Denna Haggard on: 08/23/2011 08:55 AM   Modules accepted: Orders

## 2011-09-26 ENCOUNTER — Ambulatory Visit (INDEPENDENT_AMBULATORY_CARE_PROVIDER_SITE_OTHER): Payer: Medicare Other | Admitting: Internal Medicine

## 2011-09-26 ENCOUNTER — Encounter: Payer: Self-pay | Admitting: Internal Medicine

## 2011-09-26 VITALS — BP 145/88 | HR 52 | Temp 97.3°F | Ht 71.0 in | Wt 193.7 lb

## 2011-09-26 DIAGNOSIS — I10 Essential (primary) hypertension: Secondary | ICD-10-CM

## 2011-09-26 DIAGNOSIS — Z0000 Encounter for general adult medical examination without abnormal findings: Secondary | ICD-10-CM

## 2011-09-26 DIAGNOSIS — E785 Hyperlipidemia, unspecified: Secondary | ICD-10-CM

## 2011-09-26 MED ORDER — ZOSTER VACCINE LIVE 19400 UNT/0.65ML ~~LOC~~ SOLR: 0.6500 mL | Freq: Once | SUBCUTANEOUS | Status: AC

## 2011-09-26 NOTE — Patient Instructions (Signed)
-  Please return on Monday Oct 01, 2011 for blood draw - please do not eat before you come to the clinic.  -If you have new or worsening symptoms, please call [phone].  -Continue taking your medications as you were already taking them.

## 2011-09-26 NOTE — Assessment & Plan Note (Signed)
Lab Results  Component Value Date   NA 136 05/09/2011   K 3.1* 05/09/2011   CL 101 05/09/2011   CO2 28 05/09/2011   BUN 11 05/09/2011   CREATININE 0.77 05/09/2011    BP Readings from Last 3 Encounters:  09/26/11 145/88  08/20/11 138/94  02/07/11 130/79    Assessment: Hypertension control:  mildly elevated  Progress toward goals:  deteriorated Barriers to meeting goals:  no barriers identified  Plan: Hypertension treatment:  continue current medications (HCTZ 25), patient to return in  3 months for BP follow up; pt will return on 10/01/11 for CMET blood draw.  May consider addition of ACEI if better control needed.

## 2011-09-26 NOTE — Assessment & Plan Note (Signed)
Pt to return on 10/01/11 for lab appointment to recheck FLP.  He will come fasting.  Continue pravastatin 40mg .  Based on past Lipid profiles, patient's 10year risk is 19%.  Patient's Goal LDL is <130.  If patient is at or below last LDL value, may consider decreasing pravastatin to 20mg  daily.

## 2011-09-26 NOTE — Progress Notes (Signed)
Subjective:   Patient ID: Greg [name] male   DOB: 02-Aug-1941 70 y.o.   MRN: [id number]  HPI: [name] is a 70 y.o. who presents for follow up after recent UTI on Aug 16, 2011.  He feels well today, with no new complaints.  On 08/20/11, pt's antibiotic tx was changed to Bactrim DS from Keflex after urine cx revealed gram neg rods.  Because of history of [name] and bladder atony, pt uses straight cath 2-3 times/day, drinks a lot of water for UTI prevention, and then 2-3 times/day he urinates on own without cath; no dysuria recently; regular daily DM.  He requests script for shingles vaccine.  He is not fasting today.  Past Medical History  Diagnosis Date  . Hyperlipidemia   . Hypertension   . Epididymitis     right  . Atony of bladder   . [name] (benign prostatic hyperplasia)     hyposensitive bladder, nodular prostate with increased PSA (10.23 in 03/2006), s/p prostate biopsy 2006 that was normal ([name]) he recommended TURP.  . Sinus bradycardia   . Chronic knee pain     left  . Gingivitis   . Right shoulder pain     sx in 06/2005  . Frozen shoulder     left frozen shoulder   Current Outpatient Prescriptions  Medication Sig Dispense Refill  . hydrochlorothiazide (HYDRODIURIL) 25 MG tablet Take 1 tablet (25 mg total) by mouth daily.  30 tablet  5  . pravastatin (PRAVACHOL) 40 MG tablet Take 1 tablet (40 mg total) by mouth daily.  30 tablet  5   No family history on file. History   Social History  . Marital Status: Single    Spouse Name: N/A    Number of Children: N/A  . Years of Education: 12   Occupational History  .      retired   Social History Main Topics  . Smoking status: Former Smoker    Types: Cigarettes    Quit date: 02/07/1988  . Smokeless tobacco: None  . Alcohol Use: No  . Drug Use: No  . Sexually Active: None   Other Topics Concern  . None   Social History Narrative  . None   Review of Systems: Constitutional: Denies fever, chills,  diaphoresis, appetite change and fatigue.  HEENT: Denies photophobia, eye pain, redness, hearing loss, ear pain, congestion, sore throat, rhinorrhea, sneezing, mouth sores, trouble swallowing, neck pain, neck stiffness and tinnitus.   Respiratory: Denies SOB, DOE, cough, chest tightness,  and wheezing.   Cardiovascular: Denies chest pain, palpitations and leg swelling.  Gastrointestinal: Denies nausea, vomiting, abdominal pain, diarrhea, constipation, blood in stool and abdominal distention.  Genitourinary: Denies dysuria, urgency, hematuria, and flank pain  Musculoskeletal: Denies new myalgias, back pain, joint swelling, arthralgias and gait problem.  Skin: Denies pallor, rash and wound.  Neurological: Denies dizziness, seizures, syncope, weakness, light-headedness, numbness and headaches.   Objective:  Physical Exam: Filed Vitals:   09/26/11 1336  BP: 145/88  Pulse: 52  Temp: 97.3 F (36.3 C)  TempSrc: Oral  Height: 5\' 11"  (1.803 m)  Weight: 193 lb 11.2 oz (87.862 kg)   Constitutional: Vital signs reviewed.  Patient is a well-developed and well-nourished man in no acute distress and cooperative with exam.  Mouth: no erythema or exudates, MMM Eyes: PERRL, EOMI, conjunctivae normal, No scleral icterus. .  Cardiovascular: RRR, S1 normal, S2 normal, no MRG, pulses symmetric and intact bilaterally Pulmonary/Chest: CTAB, no wheezes, rales, or rhonchi Abdominal:  Soft. Non-tender, non-distended, bowel sounds are normal Neurological: A&O x3, Strength is normal and symmetric bilaterally, cranial nerve II-XII are grossly intact, no focal motor deficit, sensory intact to light touch bilaterally.  Skin: Warm, dry and intact. No rash, cyanosis, or clubbing.   Assessment & Plan:  Case and care discussed with Dr. Aundria Rud.  Pt to return in 3 months for BP check.

## 2011-09-27 NOTE — Progress Notes (Signed)
agree

## 2011-10-01 ENCOUNTER — Other Ambulatory Visit (INDEPENDENT_AMBULATORY_CARE_PROVIDER_SITE_OTHER): Payer: Medicare Other

## 2011-10-01 ENCOUNTER — Ambulatory Visit (INDEPENDENT_AMBULATORY_CARE_PROVIDER_SITE_OTHER): Payer: Medicare Other | Admitting: Internal Medicine

## 2011-10-01 ENCOUNTER — Encounter: Payer: Self-pay | Admitting: Internal Medicine

## 2011-10-01 VITALS — BP 136/80 | HR 56 | Temp 97.4°F | Ht 71.0 in | Wt 194.7 lb

## 2011-10-01 DIAGNOSIS — E785 Hyperlipidemia, unspecified: Secondary | ICD-10-CM

## 2011-10-01 DIAGNOSIS — I10 Essential (primary) hypertension: Secondary | ICD-10-CM

## 2011-10-01 DIAGNOSIS — J069 Acute upper respiratory infection, unspecified: Secondary | ICD-10-CM | POA: Insufficient documentation

## 2011-10-01 DIAGNOSIS — Z0000 Encounter for general adult medical examination without abnormal findings: Secondary | ICD-10-CM

## 2011-10-01 LAB — COMPREHENSIVE METABOLIC PANEL
ALT: 10 U/L (ref 0–53)
AST: 17 U/L (ref 0–37)
Alkaline Phosphatase: 72 U/L (ref 39–117)
Creat: 0.86 mg/dL (ref 0.50–1.35)
Sodium: 135 mEq/L (ref 135–145)
Total Bilirubin: 0.7 mg/dL (ref 0.3–1.2)
Total Protein: 6.9 g/dL (ref 6.0–8.3)

## 2011-10-01 LAB — LIPID PANEL
HDL: 55 mg/dL (ref >39)
LDL Cholesterol: 82 mg/dL (ref 0–99)
Triglycerides: 55 mg/dL (ref <150)
VLDL: 11 mg/dL (ref 0–40)

## 2011-10-01 NOTE — Patient Instructions (Addendum)
1.  You have a virus.   Make sure you are drinking plenty of fluids and getting enough sleep.  2.  If the throat causes pain while swallowing you cause use Chloraseptic spray or salt water gargles to help sooth the pain.  3.  If you feel warm take your temperature.  If it is higher then 101 F call us.  If you have a cough that makes you short of breath you should also call.  4.  We will call you if we need to follow up on the results of the lab test today.  5.  Follow up with your PCP as planned.

## 2011-10-01 NOTE — Progress Notes (Signed)
Subjective:   Patient ID: Greg [name] male   DOB: 1941/09/12 70 y.o.   MRN: [id number]  HPI: [name] is a 70 y.o. man who presents to clinic because of hoarsness in his throat and runny nose which started about 2-3 days ago.  He states that he as exposed to sick person 4-5 days ago.  He states hasn't taken anything to help the pain in his throat or to stop his runny nose.  He denies cough, fevers, chills, nausea, vomiting, or no pain with swallowing.    Past Medical History  Diagnosis Date  . Hyperlipidemia   . Hypertension   . Epididymitis     right  . Atony of bladder   . [name] (benign prostatic hyperplasia)     hyposensitive bladder, nodular prostate with increased PSA (10.23 in 03/2006), s/p prostate biopsy 2006 that was normal ([name]) he recommended TURP.  . Sinus bradycardia   . Chronic knee pain     left  . Gingivitis   . Right shoulder pain     sx in 06/2005  . Frozen shoulder     left frozen shoulder   Current Outpatient Prescriptions  Medication Sig Dispense Refill  . hydrochlorothiazide (HYDRODIURIL) 25 MG tablet Take 1 tablet (25 mg total) by mouth daily.  30 tablet  5  . pravastatin (PRAVACHOL) 40 MG tablet Take 1 tablet (40 mg total) by mouth daily.  30 tablet  5   No family history on file. History   Social History  . Marital Status: Single    Spouse Name: N/A    Number of Children: N/A  . Years of Education: 12   Occupational History  .      retired   Social History Main Topics  . Smoking status: Former Smoker    Types: Cigarettes    Quit date: 02/07/1988  . Smokeless tobacco: None  . Alcohol Use: No  . Drug Use: No  . Sexually Active: None   Other Topics Concern  . None   Social History Narrative  . None   Review of Systems: Constitutional: Denies fever, chills, diaphoresis, appetite change and fatigue.  HEENT: Positive for sore throat, and rhinorrhea.  Denies photophobia, eye pain, redness, hearing loss, ear pain,  congestion, sneezing, mouth sores, trouble swallowing, neck pain, neck stiffness and tinnitus.   Respiratory: Denies SOB, DOE, cough, chest tightness,  and wheezing.   Cardiovascular: Denies chest pain, palpitations and leg swelling.  Gastrointestinal: Denies nausea, vomiting, abdominal pain, diarrhea, constipation, blood in stool and abdominal distention.  Genitourinary: Denies dysuria, urgency, frequency, hematuria, flank pain and difficulty urinating.  Musculoskeletal: Denies myalgias, back pain, joint swelling, arthralgias and gait problem.  Skin: Denies pallor, rash and wound.  Neurological: Denies dizziness, seizures, syncope, weakness, light-headedness, numbness and headaches.  Hematological: Denies adenopathy. Easy bruising, personal or family bleeding history  Psychiatric/Behavioral: Denies suicidal ideation, mood changes, confusion, nervousness, sleep disturbance and agitation  Objective:  Physical Exam: Filed Vitals:   10/01/11 1019  BP: 136/80  Pulse: 56  Temp: 97.4 F (36.3 C)  TempSrc: Oral  Height: 5\' 11"  (1.803 m)  Weight: 194 lb 11.2 oz (88.315 kg)   Constitutional: Vital signs reviewed.  Patient is a well-developed and well-nourished man in no acute distress and cooperative with exam. Alert and oriented x3.  Head: Normocephalic and atraumatic Ear: TM normal on left, right with mild swelling but no erythema. Mouth: posterior oropharynx erythema.  no exudates, MMM Eyes: PERRL, EOMI, conjunctivae normal, No  scleral icterus.  Neck: Supple, Trachea midline normal ROM, No JVD, mass, thyromegaly, or carotid bruit present.  Cardiovascular: RRR, S1 normal, S2 normal, no MRG, pulses symmetric and intact bilaterally Pulmonary/Chest: CTAB, no wheezes, rales, or rhonchi Abdominal: Soft. Non-tender, non-distended, bowel sounds are normal, no masses, organomegaly, or guarding present. Hematology: no cervical, inginal, or axillary adenopathy.  Skin: Warm, dry and intact. No rash,  cyanosis, or clubbing.  Psychiatric: Normal mood and affect. speech and behavior is normal. Judgment and thought content normal. Cognition and memory are normal.   Assessment & Plan:

## 2011-10-08 NOTE — Assessment & Plan Note (Signed)
His symptoms are consistent with a Viral URI.  I encouraged him to drink plenty of fluids and get plenty of sleep.  We will otherwise do symptomatic treatment with salt water gargles for the sore throat or chloraseptic spray.  He was told that if he spikes a fever with a temperature greater then 101F to give [place] a call.

## 2011-10-30 DIAGNOSIS — N3020 Other chronic cystitis without hematuria: Secondary | ICD-10-CM | POA: Diagnosis not present

## 2011-10-30 DIAGNOSIS — R82998 Other abnormal findings in urine: Secondary | ICD-10-CM | POA: Diagnosis not present

## 2011-10-30 DIAGNOSIS — N312 Flaccid neuropathic bladder, not elsewhere classified: Secondary | ICD-10-CM | POA: Diagnosis not present

## 2011-11-19 ENCOUNTER — Encounter (HOSPITAL_COMMUNITY): Payer: Self-pay | Admitting: *Deleted

## 2011-11-19 ENCOUNTER — Emergency Department (HOSPITAL_COMMUNITY)
Admission: EM | Admit: 2011-11-19 | Discharge: 2011-11-19 | Disposition: A | Discharge status: Home / Self Care | Payer: Medicare Other | Attending: Emergency Medicine | Admitting: Emergency Medicine

## 2011-11-19 ENCOUNTER — Emergency Department (HOSPITAL_COMMUNITY): Payer: Medicare Other

## 2011-11-19 DIAGNOSIS — E785 Hyperlipidemia, unspecified: Secondary | ICD-10-CM | POA: Insufficient documentation

## 2011-11-19 DIAGNOSIS — M549 Dorsalgia, unspecified: Secondary | ICD-10-CM | POA: Diagnosis not present

## 2011-11-19 DIAGNOSIS — I10 Essential (primary) hypertension: Secondary | ICD-10-CM | POA: Diagnosis not present

## 2011-11-19 DIAGNOSIS — R109 Unspecified abdominal pain: Secondary | ICD-10-CM | POA: Insufficient documentation

## 2011-11-19 LAB — CBC
HCT: 43.3 % (ref 39.0–52.0)
Hemoglobin: 14.3 g/dL (ref 13.0–17.0)
MCH: 30.6 pg (ref 26.0–34.0)
MCHC: 33 g/dL (ref 30.0–36.0)
MCV: 92.5 fL (ref 78.0–100.0)

## 2011-11-19 LAB — URINALYSIS, ROUTINE W REFLEX MICROSCOPIC
Bilirubin Urine: NEGATIVE
Glucose, UA: NEGATIVE mg/dL
Ketones, ur: NEGATIVE mg/dL
Leukocytes, UA: NEGATIVE
pH: 7 (ref 5.0–8.0)

## 2011-11-19 LAB — COMPREHENSIVE METABOLIC PANEL
Alkaline Phosphatase: 87 U/L (ref 39–117)
BUN: 11 mg/dL (ref 6–23)
Creatinine, Ser: 0.78 mg/dL (ref 0.50–1.35)
GFR calc Af Amer: >90 mL/min (ref >90)
Glucose, Bld: 99 mg/dL (ref 70–99)
Potassium: 3.7 mEq/L (ref 3.5–5.1)
Total Protein: 7.6 g/dL (ref 6.0–8.3)

## 2011-11-19 LAB — URINE MICROSCOPIC-ADD ON

## 2011-11-19 MED ORDER — ONDANSETRON HCL 4 MG/2ML IJ SOLN: 4.0000 mg | Freq: Once | INTRAMUSCULAR | Status: DC

## 2011-11-19 MED ORDER — SODIUM CHLORIDE 0.9 % IV SOLN: Freq: Once | INTRAVENOUS | Status: DC

## 2011-11-19 MED ORDER — OXYCODONE-ACETAMINOPHEN 5-325 MG PO TABS
1.0000 | ORAL_TABLET | Freq: Once | ORAL | Status: AC
  Administered 2011-11-19: 1 via ORAL
  Filled 2011-11-19: qty 1

## 2011-11-19 MED ORDER — HYDROCODONE-ACETAMINOPHEN 5-325 MG PO TABS: 1.0000 | ORAL_TABLET | Freq: Four times a day (QID) | ORAL | Status: AC | PRN

## 2011-11-19 MED ORDER — HYDROMORPHONE HCL PF 1 MG/ML IJ SOLN: 0.5000 mg | Freq: Once | INTRAMUSCULAR | Status: DC

## 2011-11-19 NOTE — ED Notes (Signed)
Pt in & out caths self 2-3 times daily in addition to voiding on own. Reports has to because unable to completely empty bladder. Denies n/v, hematuria, dysuria. Started taking macrodantin 11-06-11 for UTI.  C/o right flank pain onset last night

## 2011-11-19 NOTE — ED Provider Notes (Signed)
History     CSN: [id number]  Arrival date & time 11/19/11  1549   First MD Initiated Contact with Patient 11/19/11 1617      Chief Complaint  Patient presents with  . Flank Pain    (Consider location/radiation/quality/duration/timing/severity/associated sxs/prior treatment) Patient is a 71 y.o. male presenting with flank pain. The history is provided by the patient (The patient states that he started with right flank pain 3 days ago the pain seemed to be worse with movement no fever no chills no blood in his urine).  Flank Pain This is a new problem. The current episode started 2 days ago. The problem occurs constantly. The problem has not changed since onset.Pertinent negatives include no chest pain, no abdominal pain and no headaches. The symptoms are aggravated by bending. The symptoms are relieved by nothing. He has tried acetaminophen for the symptoms. The treatment provided no relief.    Past Medical History  Diagnosis Date  . Hyperlipidemia   . Hypertension   . Epididymitis     right  . Atony of bladder   . [name] (benign prostatic hyperplasia)     hyposensitive bladder, nodular prostate with increased PSA (10.23 in 03/2006), s/p prostate biopsy 2006 that was normal ([name]) he recommended TURP.  . Sinus bradycardia   . Chronic knee pain     left  . Gingivitis   . Right shoulder pain     sx in 06/2005  . Frozen shoulder     left frozen shoulder    Past Surgical History  Procedure Date  . Vasectomy   . Hydrocelectomy 11/14/2010    left hydrocelectomy  . Right shoulder arthroscopic surgery 06/2005  . Transurethral resection of prostate     status post TURP 2/2 [name] in 2008    History reviewed. No pertinent family history.  History  Substance Use Topics  . Smoking status: Former Smoker    Types: Cigarettes    Quit date: 02/07/1988  . Smokeless tobacco: Not on file  . Alcohol Use: No      Review of Systems  Constitutional: Negative for fatigue.  HENT:  Negative for congestion, sinus pressure and ear discharge.   Eyes: Negative for discharge.  Respiratory: Negative for cough.   Cardiovascular: Negative for chest pain.  Gastrointestinal: Negative for abdominal pain and diarrhea.  Genitourinary: Positive for flank pain. Negative for frequency and hematuria.  Musculoskeletal: Positive for back pain.  Skin: Negative for rash.  Neurological: Negative for seizures and headaches.  Hematological: Negative.   Psychiatric/Behavioral: Negative for hallucinations.    Allergies  Ciprofloxacin hcl and Ibuprofen  Home Medications   Current Outpatient Rx  Name Route Sig Dispense Refill  . HYDROCHLOROTHIAZIDE 25 MG PO TABS Oral Take 25 mg by mouth daily.    Marland Kitchen NITROFURANTOIN MACROCRYSTAL 100 MG PO CAPS Oral Take 100 mg by mouth daily.    Marland Kitchen PRAVASTATIN SODIUM 40 MG PO TABS Oral Take 40 mg by mouth daily.    Marland Kitchen HYDROCODONE-ACETAMINOPHEN 5-325 MG PO TABS Oral Take 1 tablet by mouth every 6 (six) hours as needed for pain. 20 tablet 0    BP 151/95  Pulse 50  Temp(Src) 98 F (36.7 C) (Oral)  Resp 18  SpO2 95%  Physical Exam  Constitutional: He is oriented to person, place, and time. He appears well-developed.  HENT:  Head: Normocephalic and atraumatic.  Eyes: Conjunctivae and EOM are normal. No scleral icterus.  Neck: Neck supple. No thyromegaly present.  Cardiovascular: Normal rate  and regular rhythm.  Exam reveals no gallop and no friction rub.   No murmur heard. Pulmonary/Chest: No stridor. He has no wheezes. He has no rales. He exhibits no tenderness.  Abdominal: He exhibits no distension. There is no tenderness. There is no rebound.  Musculoskeletal: Normal range of motion. He exhibits no edema.       Tender right flank  Lymphadenopathy:    He has no cervical adenopathy.  Neurological: He is oriented to person, place, and time. Coordination normal.  Skin: No rash noted. No erythema.  Psychiatric: He has a normal mood and affect. His  behavior is normal.    ED Course  Procedures (including critical care time)  Labs Reviewed  URINALYSIS, ROUTINE W REFLEX MICROSCOPIC - Abnormal; Notable for the following:    Hgb urine dipstick SMALL (*)    All other components within normal limits  COMPREHENSIVE METABOLIC PANEL - Abnormal; Notable for the following:    Albumin 3.3 (*)    GFR calc non Af Amer 89 (*)    All other components within normal limits  CBC  URINE MICROSCOPIC-ADD ON  DIFFERENTIAL   Ct Abdomen Pelvis Wo Contrast  11/19/2011  *RADIOLOGY REPORT*  Clinical Data: Acute onset right flank pain yesterday.  CT ABDOMEN AND PELVIS WITHOUT CONTRAST 11/19/2011:  Technique:  Multidetector CT imaging of the abdomen and pelvis was performed following the standard protocol without intravenous contrast.  Comparison: None.  Findings: No evidence of urinary tract calculi or obstruction on either side.  Within the limits of the unenhanced technique, no focal parenchymal abnormality involving either kidney.  Normal low dose unenhanced appearance of the liver, spleen, pancreas, and adrenal glands.  Gallbladder unremarkable by CT.  No biliary ductal dilation.  Minimal aorto-iliofemoral atherosclerosis.  No significant lymphadenopathy.  Stomach normal in appearance, filled with food.  Normal-appearing small bowel.  Large stool burden throughout the colon.  Scattered sigmoid colon diverticula without evidence of acute diverticulitis. Cecum positioned in the right upper quadrant of the abdomen, and normal appearing retrocecal appendix identified in the right mid abdomen.  No ascites.  Moderate to marked prostate gland enlargement with apparent TURP defect.  Diverticula suspected, arising from both sides of the superolateral bladder.  Bone window images demonstrate degenerative changes involving the lower thoracic and lumbar spine, partial ankylosis of the sacroiliac joints, and degenerative changes in both hips.  Visualized lung bases clear apart  from scarring in the lingula.  Heart size upper normal.  IMPRESSION:  1.  No evidence of urinary tract calculi or obstruction on either side. 2.  No acute abnormalities involving the abdomen or pelvis. 3.  Large stool burden. 4.  Moderate to marked prostate gland enlargement with apparent TURP defect. 5.  Bladder diverticula suspected.  Original Report Authenticated By: Arnell Sieving, M.D.     1. Back pain       MDM  Back pain from muscle strain        Benny Lennert, MD 11/19/11 [phone]

## 2011-11-19 NOTE — ED Notes (Signed)
The pt has had rt flank pain since last nightn  No bloody urine no pain,  He is on meds for a uti

## 2011-11-19 NOTE — ED Notes (Signed)
PT ambulated with a steady gait; VSS; no signs of distress; A&Ox3; pt reported no questions; respirations even and unlabored; skin warm and dry.

## 2011-11-19 NOTE — ED Notes (Signed)
Informed patient and/or family of status. No voiced complaints presently. NAD.  

## 2011-11-19 NOTE — ED Notes (Signed)
Informed patient and/or family of status. Awaiting test results 

## 2011-12-20 ENCOUNTER — Ambulatory Visit (INDEPENDENT_AMBULATORY_CARE_PROVIDER_SITE_OTHER): Payer: Medicare Other | Admitting: Internal Medicine

## 2011-12-20 ENCOUNTER — Encounter: Payer: Self-pay | Admitting: Internal Medicine

## 2011-12-20 DIAGNOSIS — R21 Rash and other nonspecific skin eruption: Secondary | ICD-10-CM | POA: Diagnosis not present

## 2011-12-20 DIAGNOSIS — M5412 Radiculopathy, cervical region: Secondary | ICD-10-CM | POA: Insufficient documentation

## 2011-12-20 NOTE — Assessment & Plan Note (Signed)
Dry skin on the back of the neck without any definite rash  Plan - He has a silver necklace which I will ask him to take off a few weeks if this is contact dermatitis - Apply Vaseline or any other moisturizing cream - Avoid sun exposure - Avoid frequent cleaning and are rubbing - Use Benadryl if needed for itching - Followup in 2 weeks if no improvement. - Report urgently if any rash or vesicles in that area noticed

## 2011-12-20 NOTE — Patient Instructions (Signed)
You do not have shingles. The skin on the back of the neck is very dry with some dermatitis Take of your necklace for few weeks  Use moisturizing cream (Vaseline) on the back of the neck until the itching/birning goes away.

## 2011-12-20 NOTE — Progress Notes (Signed)
Patient ID: Greg [name], male   DOB: August 29, 1941, 71 y.o.   MRN: [id number]  71 year old man comes in today to the clinic because he was to make sure he does not have shingles on the back of his neck His been having some burning and discomfort in the back of his neck for past 2 weeks  Occurs mostly when something rubs against it  He has not noticed any rash, redness, itching  He had shingles in that place many years ago and was treated for it. He did not have any postherpetic neuralgia after that. He states that this does not feel anything like that or just wanted to make sure   Physical exam   Neck- no rash,  vesicles ,  Desquamation or other skin findings. Skin may be a little dry  CVS- S1, S2 normal, no murmur Respi- clear b/l  ROS Constitutional: Denies fever, chills, diaphoresis, appetite change and fatigue.  Respiratory: Denies SOB, DOE, cough, chest tightness,  and wheezing.   Cardiovascular: Denies chest pain, palpitations and leg swelling.  Skin: Denies pallor, rash and wound.

## 2012-01-12 ENCOUNTER — Emergency Department (HOSPITAL_COMMUNITY)
Admission: EM | Admit: 2012-01-12 | Discharge: 2012-01-12 | Disposition: A | Discharge status: Home / Self Care | Payer: Medicare Other | Attending: Emergency Medicine | Admitting: Emergency Medicine

## 2012-01-12 ENCOUNTER — Encounter (HOSPITAL_COMMUNITY): Payer: Self-pay | Admitting: Emergency Medicine

## 2012-01-12 DIAGNOSIS — R209 Unspecified disturbances of skin sensation: Secondary | ICD-10-CM | POA: Diagnosis not present

## 2012-01-12 DIAGNOSIS — Z87891 Personal history of nicotine dependence: Secondary | ICD-10-CM | POA: Diagnosis not present

## 2012-01-12 DIAGNOSIS — R52 Pain, unspecified: Secondary | ICD-10-CM | POA: Insufficient documentation

## 2012-01-12 DIAGNOSIS — N400 Enlarged prostate without lower urinary tract symptoms: Secondary | ICD-10-CM | POA: Diagnosis not present

## 2012-01-12 DIAGNOSIS — I10 Essential (primary) hypertension: Secondary | ICD-10-CM | POA: Diagnosis not present

## 2012-01-12 DIAGNOSIS — E785 Hyperlipidemia, unspecified: Secondary | ICD-10-CM | POA: Insufficient documentation

## 2012-01-12 DIAGNOSIS — Z79899 Other long term (current) drug therapy: Secondary | ICD-10-CM | POA: Insufficient documentation

## 2012-01-12 HISTORY — DX: Zoster without complications: B02.9

## 2012-01-12 MED ORDER — ACETAMINOPHEN 325 MG PO TABS: 650.0000 mg | ORAL_TABLET | Freq: Once | ORAL | Status: DC

## 2012-01-12 NOTE — Discharge Instructions (Signed)
Pain of Unknown Etiology (Pain Without a Known Cause) You have come to your caregiver because of pain. Pain can occur in any part of the body. Often there is not a definite cause. If your laboratory (blood or urine) work was normal and x-rays or other studies were normal, your caregiver may treat you without knowing the cause of the pain. An example of this is the headache. Most headaches are diagnosed by taking a history. This means your caregiver asks you questions about your headaches. Your caregiver determines a treatment based on your answers. Usually testing done for headaches is normal. Often testing is not done unless there is no response to medications. Regardless of where your pain is located today, you can be given medications to make you comfortable. If no physical cause of pain can be found, most cases of pain will gradually leave as suddenly as they came.  If you have a painful condition and no reason can be found for the pain, It is importantthat you follow up with your caregiver. If the pain becomes worse or does not go away, it may be necessary to repeat tests and look further for a possible cause.  Only take over-the-counter or prescription medicines for pain, discomfort, or fever as directed by your caregiver.   For the protection of your privacy, test results can not be given over the phone. Make sure you receive the results of your test. Ask as to how these results are to be obtained if you have not been informed. It is your responsibility to obtain your test results.   You may continue all activities unless the activities cause more pain. When the pain lessens, it is important to gradually resume normal activities. Resume activities by beginning slowly and gradually increasing the intensity and duration of the activities or exercise. During periods of severe pain, bed-rest may be helpful. Lay or sit in any position that is comfortable.   Ice used for acute (sudden) conditions may be  effective. Use a large plastic bag filled with ice and wrapped in a towel. This may provide pain relief.   See your caregiver for continued problems. They can help or refer you for exercises or physical therapy if necessary.  If you were given medications for your condition, do not drive, operate machinery or power tools, or sign legal documents for 24 hours. Do not drink alcohol, take sleeping pills, or take other medications that may interfere with treatment. See your caregiver immediately if you have pain that is becoming worse and not relieved by medications. Document Released: 07/03/2001 Document Revised: 09/27/2011 Document Reviewed: 10/08/2005 [employer] Patient Information 2012 [employer]. 

## 2012-01-12 NOTE — ED Notes (Signed)
Per pt, pt reports burning and itching in back of neck and on back that has been going on for several weeks intermittently, pt trying Vaseline at home, with no relief; no redness noted at site; pt a&OX4, pt in NAD

## 2012-01-12 NOTE — ED Provider Notes (Signed)
History     CSN: [id number]  Arrival date & time 01/12/12  1620   First MD Initiated Contact with Patient 01/12/12 1737      Chief Complaint  Patient presents with  . Rash    (Consider location/radiation/quality/duration/timing/severity/associated sxs/prior treatment) HPI Patient is a 71 year old male who presents today complaining of rash and burning sensation over his neck and right lower back. He reports that this is single and on for a month. He was seen in outpatient clinics for this and he reports that there has been no change in his symptoms. Patient has tried Vaseline at home without relief. Patient reports that the pain is burning. It is not in a dermatomal distribution. He denies any blistering of the skin. Patient does have a history of shingles but does not feel that this feels similarly. He denies any fevers, nausea, or vomiting. There are no other associated or modifying factors. Past Medical History  Diagnosis Date  . Hyperlipidemia   . Hypertension   . Epididymitis     right  . Atony of bladder   . [name] (benign prostatic hyperplasia)     hyposensitive bladder, nodular prostate with increased PSA (10.23 in 03/2006), s/p prostate biopsy 2006 that was normal ([name]) he recommended TURP.  . Sinus bradycardia   . Chronic knee pain     left  . Gingivitis   . Right shoulder pain     sx in 06/2005  . Frozen shoulder     left frozen shoulder  . Shingles     Past Surgical History  Procedure Date  . Vasectomy   . Hydrocelectomy 11/14/2010    left hydrocelectomy  . Right shoulder arthroscopic surgery 06/2005  . Transurethral resection of prostate     status post TURP 2/2 [name] in 2008  . Knee surgery     L knee    History reviewed. No pertinent family history.  History  Substance Use Topics  . Smoking status: Former Smoker    Types: Cigarettes    Quit date: 02/07/1988  . Smokeless tobacco: Not on file  . Alcohol Use: No      Review of Systems    Constitutional: Negative.   HENT: Negative.   Eyes: Negative.   Respiratory: Negative.   Cardiovascular: Negative.   Gastrointestinal: Negative.   Genitourinary: Negative.   Musculoskeletal: Negative.   Skin: Positive for rash.  Neurological: Negative.   Hematological: Negative.   Psychiatric/Behavioral: Negative.   All other systems reviewed and are negative.    Allergies  Ciprofloxacin hcl and Ibuprofen  Home Medications   Current Outpatient Rx  Name Route Sig Dispense Refill  . HYDROCHLOROTHIAZIDE 25 MG PO TABS Oral Take 25 mg by mouth daily.    Marland Kitchen NITROFURANTOIN MACROCRYSTAL 100 MG PO CAPS Oral Take 100 mg by mouth daily.    Marland Kitchen PRAVASTATIN SODIUM 40 MG PO TABS Oral Take 40 mg by mouth daily.      BP 135/77  Pulse 63  Temp(Src) 97.5 F (36.4 C) (Oral)  Resp 18  SpO2 97%  Physical Exam  Nursing note and vitals reviewed. GEN: Well-developed, well-nourished elderly male in no distress HEENT: Atraumatic, normocephalic. Oropharynx clear without erythema EYES: PERRLA BL, no scleral icterus. NECK: Trachea midline, no meningismus CV: regular rate and rhythm. No murmurs, rubs, or gallops PULM: No respiratory distress.  No crackles, wheezes, or rales. GI: soft, non-tender. No guarding, rebound, or tenderness. + bowel sounds  GU: deferred Neuro: cranial nerves 2-12 intact, no  abnormalities of strength or sensation, A and O x 3 MSK: Patient moves all 4 extremities symmetrically, no deformity, edema, or injury noted Skin: No rashes petechiae, purpura, or jaundice. Patient has areas of concern evaluated extensively by myself and no evidence of erythema or edema. There are no vesicular skin changes or other lesions noted. Psych: no abnormality of mood   ED Course  Procedures (including critical care time)  Labs Reviewed - No data to display No results found.   1. Burning pain       MDM  Patient described itching and burning sensation over her several areas of the  skin. These were not in a dermatomal distribution. Absolutely no skin changes can be detected. Patient was given Tylenol for his pain. We discussed that I did not see any abnormalities today. Patient has been seen by his primary care physician once for this. I recommended that he return to be reevaluated as they will have already seen the area once and maybe a little, and more effectively than me as to whether there has been a change or not. I do not suspect cellulitis and patient was completely hemodynamically stable. Patient did not appear to be hallucinating or have other causes for his symptoms. No further treatment was indicated today and patient was referred back to his primary care physician.  Patient also did not have diffuse pruritus which might indicate a condition such as liver failure. Patient was discharged in good condition and told that he can use Tylenol for his symptoms and to followup with his primary care physician.       Cyndra Numbers, MD 01/12/12 2045

## 2012-02-01 ENCOUNTER — Ambulatory Visit (INDEPENDENT_AMBULATORY_CARE_PROVIDER_SITE_OTHER): Payer: Medicare Other | Admitting: Internal Medicine

## 2012-02-01 ENCOUNTER — Encounter: Payer: Self-pay | Admitting: Internal Medicine

## 2012-02-01 VITALS — BP 122/77 | HR 52 | Temp 96.8°F | Ht 71.0 in | Wt 194.1 lb

## 2012-02-01 DIAGNOSIS — R21 Rash and other nonspecific skin eruption: Secondary | ICD-10-CM | POA: Diagnosis not present

## 2012-02-01 MED ORDER — PRAVASTATIN SODIUM 40 MG PO TABS: 40.0000 mg | ORAL_TABLET | Freq: Every day | ORAL | Status: DC

## 2012-02-01 MED ORDER — HYDROCHLOROTHIAZIDE 25 MG PO TABS: 25.0000 mg | ORAL_TABLET | Freq: Every day | ORAL | Status: DC

## 2012-02-01 MED ORDER — AMITRIPTYLINE HCL 25 MG PO TABS: 25.0000 mg | ORAL_TABLET | Freq: Every day | ORAL | Status: DC

## 2012-02-01 MED ORDER — DIPHENHYDRAMINE-ZINC ACETATE 2-0.1 % EX CREA: TOPICAL_CREAM | Freq: Three times a day (TID) | CUTANEOUS | Status: DC | PRN

## 2012-02-01 NOTE — Progress Notes (Signed)
Patient ID: Greg [name], male   DOB: 1941/02/05, 71 y.o.   MRN: [id number] Patient ID: Greg [name], male   DOB: Mar 09, 1941, 71 y.o.   MRN: [id number]  71 year old man comes in today to the clinic for the c/o burning feeling in his neck and mid-back for past 1 month. He came to me in Feb to make sure there was not shingles. I could not see any significant rash except some redness there and asked him to stop using jewelry and use benadryl and vaseline. He has not used benadryl but has done the other things without benefit. He also went to ER for this. No new treatment was prescribed.  Occurs mostly when something rubs against it  He has not noticed any rash, redness, itching  He had shingles in that place many years ago and was treated for it. He did not have any postherpetic neuralgia after that. He states that this does not feel anything like that or just wanted to make sure   Physical exam   Neck- no rash,  vesicles ,  Desquamation or other skin findings. Skin may be a little dry  CVS- S1, S2 normal, no murmur Respi- clear b/l  ROS Constitutional: Denies fever, chills, diaphoresis, appetite change and fatigue.  Respiratory: Denies SOB, DOE, cough, chest tightness,  and wheezing.   Cardiovascular: Denies chest pain, palpitations and leg swelling.  Skin: Denies pallor, rash and wound.

## 2012-02-01 NOTE — Assessment & Plan Note (Signed)
Complaint persists.  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

## 2012-02-01 NOTE — Patient Instructions (Signed)
Use benadryl and vaseline on the skin of the back and neck If does not get better in a week, give me a call and I will refer you to a skin doctor

## 2012-02-06 DIAGNOSIS — R972 Elevated prostate specific antigen [PSA]: Secondary | ICD-10-CM | POA: Diagnosis not present

## 2012-02-13 DIAGNOSIS — R972 Elevated prostate specific antigen [PSA]: Secondary | ICD-10-CM | POA: Diagnosis not present

## 2012-02-13 DIAGNOSIS — N312 Flaccid neuropathic bladder, not elsewhere classified: Secondary | ICD-10-CM | POA: Diagnosis not present

## 2012-02-13 DIAGNOSIS — N3020 Other chronic cystitis without hematuria: Secondary | ICD-10-CM | POA: Diagnosis not present

## 2012-02-13 DIAGNOSIS — N401 Enlarged prostate with lower urinary tract symptoms: Secondary | ICD-10-CM | POA: Diagnosis not present

## 2012-02-25 ENCOUNTER — Ambulatory Visit (INDEPENDENT_AMBULATORY_CARE_PROVIDER_SITE_OTHER): Payer: Medicare Other | Admitting: Internal Medicine

## 2012-02-25 ENCOUNTER — Encounter: Payer: Self-pay | Admitting: Internal Medicine

## 2012-02-25 VITALS — BP 137/86 | HR 57 | Temp 97.4°F | Ht 71.0 in | Wt 192.0 lb

## 2012-02-25 DIAGNOSIS — E785 Hyperlipidemia, unspecified: Secondary | ICD-10-CM | POA: Diagnosis not present

## 2012-02-25 DIAGNOSIS — N400 Enlarged prostate without lower urinary tract symptoms: Secondary | ICD-10-CM | POA: Diagnosis not present

## 2012-02-25 DIAGNOSIS — K921 Melena: Secondary | ICD-10-CM

## 2012-02-25 DIAGNOSIS — I10 Essential (primary) hypertension: Secondary | ICD-10-CM

## 2012-02-25 DIAGNOSIS — R21 Rash and other nonspecific skin eruption: Secondary | ICD-10-CM

## 2012-02-25 DIAGNOSIS — Z23 Encounter for immunization: Secondary | ICD-10-CM | POA: Diagnosis not present

## 2012-02-25 MED ORDER — ZOSTER VACCINE LIVE 19400 UNT/0.65ML ~~LOC~~ SOLR: 0.6500 mL | Freq: Once | SUBCUTANEOUS | Status: AC

## 2012-02-25 MED ORDER — PRAVASTATIN SODIUM 20 MG PO TABS: 20.0000 mg | ORAL_TABLET | Freq: Every day | ORAL | Status: DC

## 2012-02-25 MED ORDER — AMITRIPTYLINE HCL 25 MG PO TABS: 25.0000 mg | ORAL_TABLET | Freq: Every day | ORAL | Status: DC

## 2012-02-25 NOTE — Progress Notes (Signed)
Subjective:   Patient ID: Greg [name] male   DOB: 12/24/1940 71 y.o.   MRN: [id number]  HPI: [name] is a 71 y.o. man with past medical history significant for shingles, hypertension, hyperlipidemia and [name].  He presents today for followup of his blood pressure and burning sensation behind his neck.  Hypertension: No acute issues. Takes blood pressure medication daily. Denies chest pain, dizziness, shortness of breath, headache, & vision changes.    Burning sensation over skin behind neck and mid back: Has tried Benadryl when necessary, with minimal to no relief. Did not fill Elavil after last visit, he thought medication was for insomnia.  Burning sensation has persisted for almost 2 months now, but he has not had a rash or changes in skin appearance since occurrence.  Burning sensation is intermittent.  It is not accompanied by a sharp shooting pain down his arms. He localizes sensation to behind his neck, left greater than right, and this is also the location where he had shingles years ago.    No suspicion of diabetes or thyroid disease - he denies change in urination.  Normal daily bowel movements with prune juice. No heart palpitation. No tremors. No difficulty with sleep.    Current Outpatient Prescriptions  Medication Sig Dispense Refill  . hydrochlorothiazide (HYDRODIURIL) 25 MG tablet Take 1 tablet (25 mg total) by mouth daily.  31 tablet  11  . pravastatin (PRAVACHOL) 20 MG tablet Take 1 tablet (20 mg total) by mouth at bedtime.  30 tablet  11  . DISCONTD: pravastatin (PRAVACHOL) 40 MG tablet Take 1 tablet (40 mg total) by mouth daily.  31 tablet  11  . amitriptyline (ELAVIL) 25 MG tablet Take 1 tablet (25 mg total) by mouth at bedtime.  31 tablet  1  . zoster vaccine live, PF, (ZOSTAVAX) 40981 UNT/0.65ML injection Inject 19,400 Units into the skin once.  1 each  0  . DISCONTD: amitriptyline (ELAVIL) 25 MG tablet Take 1 tablet (25 mg total) by mouth at bedtime.  31  tablet  1   Review of Systems: Constitutional: Denies fever, chills, diaphoresis, appetite change and fatigue.  HEENT: Denies photophobia, eye pain, redness, hearing loss, ear pain, congestion, sore throat, rhinorrhea, sneezing, mouth sores, trouble swallowing, neck pain, neck stiffness and tinnitus.   Respiratory: Denies SOB, DOE, cough, chest tightness,  and wheezing.   Cardiovascular: Denies chest pain, palpitations and leg swelling.  Gastrointestinal: Denies nausea, vomiting, abdominal pain, diarrhea, constipation, blood in stool and abdominal distention.  Genitourinary: Denies dysuria, urgency, frequency, hematuria, flank pain and difficulty urinating.  Musculoskeletal: Denies myalgias, back pain, joint swelling, arthralgias and gait problem.  Neurological: Denies dizziness, seizures, syncope, weakness, light-headedness, numbness and headaches.  Psychiatric/Behavioral: Denies suicidal ideation, mood changes, confusion, nervousness, sleep disturbance and agitation  Objective:  Physical Exam: Filed Vitals:   02/25/12 0814  BP: 137/86  Pulse: 57  Temp: 97.4 F (36.3 C)  TempSrc: Oral  Height: 5\' 11"  (1.803 m)  Weight: 192 lb (87.091 kg)   Constitutional: Vital signs reviewed.  Patient is a well-developed and well-nourished man in no acute distress and cooperative with exam.  appears his stated age Mouth: no erythema or exudates, MMM, poor dentition Eyes: PERRL, EOMI, conjunctivae normal, No scleral icterus.  Neck: No thyromegaly Cardiovascular: Regular, bradycardic, S1 normal, S2 normal, no MRG, pulses symmetric and intact bilaterally Pulmonary/Chest: CTAB, no wheezes, rales, or rhonchi Abdominal: Soft. Non-tender, non-distended, bowel sounds are normal, no masses, organomegaly, or guarding present.  Musculoskeletal: No  joint deformities, erythema, or stiffness, ROM full and no nontender Neurological: A&O x3, Strength is normal and symmetric bilaterally, cranial nerve II-XII are  grossly intact, no focal motor deficit, sensory intact to light touch bilaterally.  Skin: Warm, dry and intact. No rash or erythema noticeable on posterior neck or back  Psychiatric: Normal mood and affect. speech and behavior is normal. Judgment and thought content normal. Cognition and memory are normal.   Assessment & Plan:  Case and care discussed with Dr. Meredith Pel. Patient return in one month to followup regarding burning sensation over skin. He will then need a three-month followup for his blood pressure checked.  Please see problem oriented charting for further details.

## 2012-02-25 NOTE — Assessment & Plan Note (Signed)
There is no actual rash on his skin, but complaint of burning sensation persists, though intermittent.  He did not use Elavil after last appointment in April, as he was under the impression that it was for sleep.  No significant improvement with Benadryl.  He does not have chain around his neck today.    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  He is agreeable to trying amitriptyline 25 mg before bed for 1 month and he will return for followup.  No burning sensation appears to be very superficial, if Amitriptyline does not work,  may consider deep-rooted issue such as nerve impingement, but I have low suspicion of this as patient has not endorse any sharp shooting pain.

## 2012-02-25 NOTE — Patient Instructions (Signed)
-  Please try taking Amitriptyline 25mg  daily at bedtime for about 1 month.  We are treating for a possible after effect from when you had shingles.  If you feel any unwanted side effects with the medication, please call and leave a message for me at [phone].  -Your cholesterol is great! You can decrease your Pravastatin to 20mg  daily at bedtime.  -Continue to take your blood pressure medication as written.  -You can go to your pharmacy to receive your shingles vaccine.  Please be sure to bring all of your medications with you to every visit.  Should you have any new or worsening symptoms, please be sure to call the clinic at [phone].

## 2012-02-25 NOTE — Assessment & Plan Note (Signed)
Discussed need for colonoscopy again today. He tells me that he needs to arrange a ride and then he will let us know about when he can schedule colonoscopy. I expressed the importance of colonoscopy in setting of Hemoccult-positive stool, and he understands the need to screen for colon cancer.

## 2012-02-25 NOTE — Assessment & Plan Note (Signed)
Lab Results  Component Value Date   NA 138 11/19/2011   K 3.7 11/19/2011   CL 100 11/19/2011   CO2 28 11/19/2011   BUN 11 11/19/2011   CREATININE 0.78 11/19/2011   CREATININE 0.86 10/01/2011    BP Readings from Last 3 Encounters:  02/25/12 137/86  02/01/12 122/77  01/12/12 135/77    Assessment: Hypertension control:  controlled  Progress toward goals:  at goal Barriers to meeting goals:  no barriers identified  Plan: Hypertension treatment:  continue current medications (HCTZ 25 daily)

## 2012-02-25 NOTE — Assessment & Plan Note (Signed)
Continues to be followed by Dr. Annabell Howells of urology.  Performs straight catheterization twice a day.  No recent urinary tract infection.

## 2012-02-25 NOTE — Assessment & Plan Note (Signed)
Last LDL = 82 (10/01/11), Goal LDL <130.  Will decrease to pravastatin 20mg  daily, with plan to follow up with annual Lipid profile.  May increase to 40mg  if lipids out of control next year.

## 2012-03-05 DIAGNOSIS — R300 Dysuria: Secondary | ICD-10-CM | POA: Diagnosis not present

## 2012-03-05 DIAGNOSIS — N401 Enlarged prostate with lower urinary tract symptoms: Secondary | ICD-10-CM | POA: Diagnosis not present

## 2012-03-19 DIAGNOSIS — R300 Dysuria: Secondary | ICD-10-CM | POA: Diagnosis not present

## 2012-04-07 ENCOUNTER — Encounter: Payer: Self-pay | Admitting: Internal Medicine

## 2012-04-17 ENCOUNTER — Encounter: Payer: Medicare Other | Admitting: Internal Medicine

## 2012-04-26 ENCOUNTER — Encounter (HOSPITAL_COMMUNITY): Payer: Self-pay | Admitting: *Deleted

## 2012-04-26 ENCOUNTER — Emergency Department (HOSPITAL_COMMUNITY)
Admission: EM | Admit: 2012-04-26 | Discharge: 2012-04-26 | Disposition: A | Discharge status: Home / Self Care | Payer: Medicare Other | Attending: Emergency Medicine | Admitting: Emergency Medicine

## 2012-04-26 DIAGNOSIS — M545 Low back pain, unspecified: Secondary | ICD-10-CM | POA: Diagnosis not present

## 2012-04-26 DIAGNOSIS — M542 Cervicalgia: Secondary | ICD-10-CM | POA: Diagnosis not present

## 2012-04-26 DIAGNOSIS — I10 Essential (primary) hypertension: Secondary | ICD-10-CM | POA: Insufficient documentation

## 2012-04-26 DIAGNOSIS — E785 Hyperlipidemia, unspecified: Secondary | ICD-10-CM | POA: Diagnosis not present

## 2012-04-26 DIAGNOSIS — M549 Dorsalgia, unspecified: Secondary | ICD-10-CM

## 2012-04-26 DIAGNOSIS — Z87891 Personal history of nicotine dependence: Secondary | ICD-10-CM | POA: Insufficient documentation

## 2012-04-26 DIAGNOSIS — G8929 Other chronic pain: Secondary | ICD-10-CM | POA: Insufficient documentation

## 2012-04-26 MED ORDER — GABAPENTIN 100 MG PO CAPS: 100.0000 mg | ORAL_CAPSULE | Freq: Three times a day (TID) | ORAL | Status: DC

## 2012-04-26 MED ORDER — OXYCODONE-ACETAMINOPHEN 5-325 MG PO TABS: 1.0000 | ORAL_TABLET | ORAL | Status: AC | PRN

## 2012-04-26 NOTE — ED Provider Notes (Signed)
History     CSN: [id number]  Arrival date & time 04/26/12  1224   First MD Initiated Contact with Patient 04/26/12 1318      Chief Complaint  Patient presents with  . Neck Pain    (Consider location/radiation/quality/duration/timing/severity/associated sxs/prior treatment) Patient is a 71 y.o. male presenting with neck pain. The history is provided by the patient.  Neck Pain   He been having many burning pain in his neck for the last several months. Sometimes present across his mid back. Pain is moderately severe and he rates it at 7/10. It is worse with palpation and sometimes wakes him up. He did see a physician at [facility] apply a cream which did not help. It has been getting worse over the last week. He has been seen in his physician's office and in the emergency department with no diagnosis having been found. Denies any weakness, numbness, tingling. He denies fever, chills, sweats.  Past Medical History  Diagnosis Date  . Hyperlipidemia   . Hypertension   . Epididymitis     right  . Atony of bladder     followed at [facility]; CIC 3x/day, has been treated for culture proven serratia marcesens 05/2011, 06/2011, 07/2011, 09/2011  . [name] (benign prostatic hyperplasia)     hyposensitive bladder, nodular prostate with increased PSA (10.23 in 03/2006), s/p prostate biopsy 2006 that was normal ([name]) he recommended TURP.  . Sinus bradycardia   . Chronic knee pain     left  . Gingivitis   . Right shoulder pain     sx in 06/2005  . Frozen shoulder     left frozen shoulder  . Shingles     Past Surgical History  Procedure Date  . Vasectomy   . Hydrocelectomy 11/14/2010    left hydrocelectomy  . Right shoulder arthroscopic surgery 06/2005  . Transurethral resection of prostate     status post TURP 2/2 [name] in 2008  . Knee surgery     L knee    History reviewed. No pertinent family history.  History  Substance Use Topics  . Smoking status: Former Smoker    Types:  Cigarettes    Quit date: 02/07/1988  . Smokeless tobacco: Not on file  . Alcohol Use: No      Review of Systems  HENT: Positive for neck pain.   All other systems reviewed and are negative.    Allergies  Ciprofloxacin hcl and Ibuprofen  Home Medications   Current Outpatient Rx  Name Route Sig Dispense Refill  . HYDROCHLOROTHIAZIDE 25 MG PO TABS Oral Take 1 tablet (25 mg total) by mouth daily. 31 tablet 11  . PRAVASTATIN SODIUM 20 MG PO TABS Oral Take 1 tablet (20 mg total) by mouth at bedtime. 30 tablet 11    BP 131/83  Pulse 51  Temp 97.8 F (36.6 C) (Oral)  Resp 14  SpO2 99%  Physical Exam  Nursing note and vitals reviewed.  71 year old male who is resting comfortable in no acute distress. Vital signs are significant for bradycardia with heart rate of 51. Oxygen saturation is 99% which is normal. Head is normocephalic and atraumatic. PERRLA, EOMI. Neck is supple without ade Back te Mild tenderness to palpation of the soft tissues of the paracervical soft tissues bilaterally. Back has mild to tenderness to palpation in the soft tissues of the paralumbar area which is worse on the right. Lungs are clear without rales, wheezes, rhonchi. Heart has regular rate and rhythm without  murmur. Abdomen is soft, flat, nontender without masses or hepatosplenomegaly. Extremities have 1+ edema, no cyanosis. Skin head is generally dry, but no rashes seen. Neurologic: Mental status is normal, cranial nerves are intact, there are no motor or sensory deficits.  ED Course  Procedures (including critical care time)    1. Pain in neck   2. Pain in back       MDM  Neck and back which seems to be neuritic in nature. I'm not sure the underlying pathology, but will try of 3 times a day and also given a prescription for Percocet. He is to follow up with his PCP for titration of gabapentin dose and further workup as indicated         Dione Booze, MD 04/26/12 1341

## 2012-04-26 NOTE — ED Notes (Signed)
Pt reports a burning sensation down bilateral sides of neck.  No blisters, redness, rash noted.  Pt reports having shingles in 2000 and reports that he thinks it feels similar.  No distress noted.

## 2012-04-26 NOTE — ED Notes (Signed)
Patient with c/o neck pain/burning for about a month and pain has gotten worse for the past week.  Patient has been seen in the Ed for the same about a month ago and was given cream for it.  No other complaints or symptoms

## 2012-04-28 DIAGNOSIS — N401 Enlarged prostate with lower urinary tract symptoms: Secondary | ICD-10-CM | POA: Diagnosis not present

## 2012-04-28 DIAGNOSIS — N390 Urinary tract infection, site not specified: Secondary | ICD-10-CM | POA: Diagnosis not present

## 2012-04-28 DIAGNOSIS — R300 Dysuria: Secondary | ICD-10-CM | POA: Diagnosis not present

## 2012-04-28 DIAGNOSIS — N312 Flaccid neuropathic bladder, not elsewhere classified: Secondary | ICD-10-CM | POA: Diagnosis not present

## 2012-05-09 ENCOUNTER — Encounter: Payer: Self-pay | Admitting: Internal Medicine

## 2012-05-09 ENCOUNTER — Ambulatory Visit (INDEPENDENT_AMBULATORY_CARE_PROVIDER_SITE_OTHER): Payer: Medicare Other | Admitting: Internal Medicine

## 2012-05-09 VITALS — BP 127/75 | HR 95 | Temp 97.5°F | Ht 71.0 in | Wt 192.2 lb

## 2012-05-09 DIAGNOSIS — E785 Hyperlipidemia, unspecified: Secondary | ICD-10-CM | POA: Diagnosis not present

## 2012-05-09 DIAGNOSIS — M542 Cervicalgia: Secondary | ICD-10-CM

## 2012-05-09 DIAGNOSIS — I10 Essential (primary) hypertension: Secondary | ICD-10-CM

## 2012-05-09 DIAGNOSIS — R209 Unspecified disturbances of skin sensation: Secondary | ICD-10-CM | POA: Diagnosis not present

## 2012-05-09 DIAGNOSIS — R208 Other disturbances of skin sensation: Secondary | ICD-10-CM

## 2012-05-09 NOTE — Assessment & Plan Note (Signed)
Patient has burning sensation and mild pain in his neck, which has been going on for several months. Patient had a history of shingles breakout at same areas in the past (2001?). It is likely that patient's symptoms are caused by neuralgia. Patient was given a prescription of gabapentin in his recent visit to ED, 100 mg 3 times a day, but the patient did not start taking his medication yet.  Of note, patient had abnormal C-spine x-ray in the past. 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 Currently patient doesn't have any spinal cord compression symptoms. He doesn't have weakness, numbness or decreased sensation in his extremities. It is difficult to completely rule out the possibility of cervical nerve impingement.  -will let patient start taking gabapentin from now. -will give him referral to neurology for further evaluation.

## 2012-05-09 NOTE — Assessment & Plan Note (Signed)
Patient is currently taking pravastatin, no side effects noticed. His AST and ALT were normal at 11/19/11. His LDL was 82 at 10/01/11. We'll continue current regimen.

## 2012-05-09 NOTE — Patient Instructions (Signed)
1. Please start taking gabapentin which should help your burning in her neck. 2. Please take all medications as prescribed.  3. If you have worsening of your symptoms or new symptoms arise, please call the clinic ([phone]), or go to the ER immediately if symptoms are severe.

## 2012-05-09 NOTE — Progress Notes (Signed)
Patient ID: Greg [name], male   DOB: 1941/08/04, 71 y.o.   MRN: [id number]   Subjective:   Patient ID: Greg [name] male   DOB: [birth year] 71 y.o.   MRN: [id number]  HPI: Greg [name] is a 72 y.o. with a past medical history as outlined below, who presents for a hospital followup visit today.  Patient visited ED at 04/26/12 because of neck burning pain. He been having many burning pain in his neck for several months. He tried amitriptyline without significant improvement. It is located at back of his neck. His pain is mild,  nonradiating at the back of his neck skin. It is not associated with weakness, numbness or decreased sensation in his extremities. There is no limitation of neck movement. Patient reports that he had shingles rashes in the past (2001?).   Of note, patient's X-ray on 2009 showed cervical spondylosis and neural foraminal narrowing at C3- 4 and C4-5.   Denies fever, chills, fatigue, headaches,  cough, chest pain, SOB,  abdominal pain,diarrhea, constipation, dysuria, urgency, frequency, hematuria, joint pain or leg swelling.   Past Medical History  Diagnosis Date  . Hyperlipidemia   . Hypertension   . Epididymitis     right  . Atony of bladder     followed at [facility]; CIC 3x/day, has been treated for culture proven serratia marcesens 05/2011, 06/2011, 07/2011, 09/2011  . [name] (benign prostatic hyperplasia)     hyposensitive bladder, nodular prostate with increased PSA (10.23 in 03/2006), s/p prostate biopsy 2006 that was normal ([name]) he recommended TURP.  . Sinus bradycardia   . Chronic knee pain     left  . Gingivitis   . Right shoulder pain     sx in 06/2005  . Frozen shoulder     left frozen shoulder  . Shingles    Current Outpatient Prescriptions  Medication Sig Dispense Refill  . hydrochlorothiazide (HYDRODIURIL) 25 MG tablet Take 1 tablet (25 mg total) by mouth daily.  31 tablet  11  . pravastatin (PRAVACHOL) 20 MG tablet Take 1 tablet  (20 mg total) by mouth at bedtime.  30 tablet  11  . gabapentin (NEURONTIN) 100 MG capsule Take 1 capsule (100 mg total) by mouth 3 (three) times daily.  30 capsule  0   No family history on file. History   Social History  . Marital Status: Single    Spouse Name: N/A    Number of Children: N/A  . Years of Education: 12   Occupational History  .      retired   Social History Main Topics  . Smoking status: Former Smoker    Types: Cigarettes    Quit date: 02/07/1988  . Smokeless tobacco: None  . Alcohol Use: No  . Drug Use: No  . Sexually Active: None   Other Topics Concern  . None   Social History Narrative  . None   Review of Systems:  General: no fevers, chills, no changes in body weight, no changes in appetite Skin: no rash HEENT: no blurry vision, hearing changes or sore throat. Has burning sensation in back of neck. Pulm: no dyspnea, coughing, wheezing CV: no chest pain, palpitations, shortness of breath Abd: no nausea/vomiting, abdominal pain, diarrhea/constipation GU: no dysuria, hematuria, polyuria Ext: no arthralgias, myalgias Neuro: no weakness, numbness, or tingling   Objective:  Physical Exam: Filed Vitals:   05/09/12 1128  BP: 127/75  Pulse: 95  Temp: 97.5 F (36.4 C)  TempSrc: Oral  Height:  5\' 11"  (1.803 m)  Weight: 192 lb 3.2 oz (87.181 kg)   general: resting in bed, not in acute distress HEENT: PERRL, EOMI, no scleral icterus. There is no rashes over his neck. Minimal tenderness over the back of neck Cardiac: S1/S2, RRR, No murmurs, gallops or rubs Pulm: Good air movement bilaterally, Clear to auscultation bilaterally, No rales, wheezing, rhonchi or rubs. Abd: Soft,  nondistended, nontender, no rebound pain, no organomegaly, BS present Ext: No rashes or edema, 2+DP/PT pulse bilaterally Musculoskeletal: No joint deformities, erythema, or stiffness, ROM full. There is no tenderness over the midline of C-spine.  Skin: no rashes. No skin  bruise. Neuro: alert and oriented X3, cranial nerves II-XII grossly intact, muscle strength 5/5 in all extremeties,  sensation to light touch intact.  Psych.: patient is not psychotic, no suicidal or hemocidal ideation.\    Assessment & Plan:

## 2012-05-09 NOTE — Assessment & Plan Note (Signed)
It is well controlled. Today blood pressure is 127/75. We'll continue current regimen.

## 2012-05-22 ENCOUNTER — Other Ambulatory Visit: Payer: Self-pay | Admitting: Neurology

## 2012-05-22 DIAGNOSIS — R269 Unspecified abnormalities of gait and mobility: Secondary | ICD-10-CM | POA: Diagnosis not present

## 2012-05-22 DIAGNOSIS — M542 Cervicalgia: Secondary | ICD-10-CM | POA: Diagnosis not present

## 2012-05-22 DIAGNOSIS — R32 Unspecified urinary incontinence: Secondary | ICD-10-CM | POA: Diagnosis not present

## 2012-05-28 ENCOUNTER — Ambulatory Visit
Admission: RE | Admit: 2012-05-28 | Discharge: 2012-05-28 | Disposition: A | Discharge status: Home / Self Care | Payer: Medicare Other | Source: Ambulatory Visit | Attending: Neurology | Admitting: Neurology

## 2012-05-28 DIAGNOSIS — M47812 Spondylosis without myelopathy or radiculopathy, cervical region: Secondary | ICD-10-CM | POA: Diagnosis not present

## 2012-05-28 DIAGNOSIS — R269 Unspecified abnormalities of gait and mobility: Secondary | ICD-10-CM

## 2012-05-28 DIAGNOSIS — M5020 Other cervical disc displacement, unspecified cervical region: Secondary | ICD-10-CM | POA: Diagnosis not present

## 2012-05-28 DIAGNOSIS — M5030 Other cervical disc degeneration, unspecified cervical region: Secondary | ICD-10-CM | POA: Diagnosis not present

## 2012-05-28 DIAGNOSIS — M542 Cervicalgia: Secondary | ICD-10-CM

## 2012-05-29 DIAGNOSIS — R269 Unspecified abnormalities of gait and mobility: Secondary | ICD-10-CM | POA: Diagnosis not present

## 2012-05-29 DIAGNOSIS — R3911 Hesitancy of micturition: Secondary | ICD-10-CM | POA: Diagnosis not present

## 2012-05-29 DIAGNOSIS — I10 Essential (primary) hypertension: Secondary | ICD-10-CM | POA: Diagnosis not present

## 2012-05-29 DIAGNOSIS — R292 Abnormal reflex: Secondary | ICD-10-CM | POA: Diagnosis not present

## 2012-05-29 DIAGNOSIS — M542 Cervicalgia: Secondary | ICD-10-CM | POA: Diagnosis not present

## 2012-06-03 DIAGNOSIS — I10 Essential (primary) hypertension: Secondary | ICD-10-CM | POA: Diagnosis not present

## 2012-06-03 DIAGNOSIS — R3911 Hesitancy of micturition: Secondary | ICD-10-CM | POA: Diagnosis not present

## 2012-06-03 DIAGNOSIS — R292 Abnormal reflex: Secondary | ICD-10-CM | POA: Diagnosis not present

## 2012-06-03 DIAGNOSIS — R269 Unspecified abnormalities of gait and mobility: Secondary | ICD-10-CM | POA: Diagnosis not present

## 2012-06-03 DIAGNOSIS — M542 Cervicalgia: Secondary | ICD-10-CM | POA: Diagnosis not present

## 2012-06-17 DIAGNOSIS — R269 Unspecified abnormalities of gait and mobility: Secondary | ICD-10-CM | POA: Diagnosis not present

## 2012-06-17 DIAGNOSIS — M542 Cervicalgia: Secondary | ICD-10-CM | POA: Diagnosis not present

## 2012-06-17 DIAGNOSIS — R3911 Hesitancy of micturition: Secondary | ICD-10-CM | POA: Diagnosis not present

## 2012-06-17 DIAGNOSIS — I10 Essential (primary) hypertension: Secondary | ICD-10-CM | POA: Diagnosis not present

## 2012-06-17 DIAGNOSIS — R292 Abnormal reflex: Secondary | ICD-10-CM | POA: Diagnosis not present

## 2012-07-08 DIAGNOSIS — N390 Urinary tract infection, site not specified: Secondary | ICD-10-CM | POA: Diagnosis not present

## 2012-07-09 DIAGNOSIS — R269 Unspecified abnormalities of gait and mobility: Secondary | ICD-10-CM | POA: Diagnosis not present

## 2012-07-09 DIAGNOSIS — M542 Cervicalgia: Secondary | ICD-10-CM | POA: Diagnosis not present

## 2012-07-22 DIAGNOSIS — N3020 Other chronic cystitis without hematuria: Secondary | ICD-10-CM | POA: Diagnosis not present

## 2012-08-08 ENCOUNTER — Encounter: Payer: Self-pay | Admitting: Internal Medicine

## 2012-08-08 ENCOUNTER — Ambulatory Visit (INDEPENDENT_AMBULATORY_CARE_PROVIDER_SITE_OTHER): Payer: Medicare Other | Admitting: Internal Medicine

## 2012-08-08 VITALS — BP 127/76 | HR 55 | Temp 96.9°F | Ht 71.0 in | Wt 191.5 lb

## 2012-08-08 DIAGNOSIS — R209 Unspecified disturbances of skin sensation: Secondary | ICD-10-CM | POA: Diagnosis not present

## 2012-08-08 DIAGNOSIS — R208 Other disturbances of skin sensation: Secondary | ICD-10-CM

## 2012-08-08 MED ORDER — GABAPENTIN 300 MG PO CAPS: ORAL_CAPSULE | ORAL | Status: DC

## 2012-08-08 NOTE — Progress Notes (Signed)
Subjective:   Patient ID: Greg [name] male   DOB: [birth year] 71 y.o.   MRN: [id number]  HPI: Greg [name] is a 71 y.o. man with past medical history significant for shingles, hypertension, hyperlipidemia and [name]. He presents today for followup of burning behind his neck.  I last saw him for this problem on 02/25/12, then he was seen in the ED on 04/26/12 followed by Dr. Clyde Lundborg on 05/09/12.  He was also referred to neurology.    Has tried Benadryl when necessary in the past, with minimal to no relief. I do not think that he filled Elavil.  Burning sensation has persisted since around March 2013, but he has not had a rash or changes in skin appearance since occurrence. Burning sensation is intermittent. It is not accompanied by a sharp shooting pain down his arms. He localizes sensation to behind his neck, left greater than right, and this is also the location where he had shingles years ago.   Past Medical History  Diagnosis Date  . Hyperlipidemia   . Hypertension   . Epididymitis     right  . Atony of bladder     followed at [facility]; CIC 3x/day, has been treated for culture proven serratia marcesens 05/2011, 06/2011, 07/2011, 09/2011  . [name] (benign prostatic hyperplasia)     hyposensitive bladder, nodular prostate with increased PSA (10.23 in 03/2006), s/p prostate biopsy 2006 that was normal ([name]) he recommended TURP.  . Sinus bradycardia   . Chronic knee pain     left  . Gingivitis   . Right shoulder pain     sx in 06/2005  . Frozen shoulder     left frozen shoulder  . Shingles    Current Outpatient Prescriptions  Medication Sig Dispense Refill  . gabapentin (NEURONTIN) 100 MG capsule Take 1 capsule (100 mg total) by mouth 3 (three) times daily.  30 capsule  0  . hydrochlorothiazide (HYDRODIURIL) 25 MG tablet Take 1 tablet (25 mg total) by mouth daily.  31 tablet  11  . pravastatin (PRAVACHOL) 20 MG tablet Take 1 tablet (20 mg total) by mouth at bedtime.  30 tablet   11   No family history on file. History   Social History  . Marital Status: Single    Spouse Name: N/A    Number of Children: N/A  . Years of Education: 12   Occupational History  .      retired   Social History Main Topics  . Smoking status: Former Smoker    Types: Cigarettes    Quit date: 02/07/1988  . Smokeless tobacco: None  . Alcohol Use: No  . Drug Use: No  . Sexually Active: None   Other Topics Concern  . None   Social History Narrative  . None   Review of Systems: Constitutional: Denies fever, chills, diaphoresis, appetite change and fatigue.  HEENT: Denies photophobia, eye pain, redness, hearing loss, ear pain, congestion, sore throat, rhinorrhea, sneezing, mouth sores, trouble swallowing, neck pain, neck stiffness and tinnitus.  Respiratory: Denies SOB, DOE, cough, chest tightness, and wheezing.  Cardiovascular: Denies chest pain, palpitations and leg swelling.  Gastrointestinal: Denies nausea, vomiting, abdominal pain, diarrhea, constipation, blood in stool and abdominal distention.  Genitourinary: Denies dysuria, urgency, frequency, hematuria, flank pain and difficulty urinating.  Musculoskeletal: Denies myalgias, joint swelling, and gait problem.  Neurological: Denies dizziness, seizures, syncope, weakness, light-headedness, numbness and headaches.  Psychiatric/Behavioral: Denies suicidal ideation, mood changes, confusion, nervousness, sleep disturbance and agitation  Objective:  Physical Exam: Filed Vitals:   08/08/12 1004  BP: 127/76  Pulse: 55  Temp: 96.9 F (36.1 C)  TempSrc: Oral  Height: 5\' 11"  (1.803 m)  Weight: 191 lb 8 oz (86.864 kg)  SpO2: 98%   Constitutional: Vital signs reviewed. Patient is a well-developed and well-nourished man in no acute distress and cooperative with exam. Mouth: no erythema or exudates, MMM, poor dentition  Eyes: PERRL, EOMI, conjunctivae normal, No scleral icterus.  Cardiovascular: Regular, bradycardic, S1  normal, S2 normal, no MRG, pulses symmetric and intact bilaterally  Pulmonary/Chest: CTAB, no wheezes, rales, or rhonchi  Abdominal: Soft. Non-tender, non-distended, bowel sounds are normal, no masses, organomegaly, or guarding present.  Musculoskeletal: No joint deformities, erythema, or stiffness, ROM full and no nontender Neurological: A&O x3, Strength is normal and symmetric bilaterally, cranial nerve II-XII are grossly intact, no focal motor deficit, sensory intact to light touch bilaterally.  Skin: Warm, dry and intact. No rash or erythema noticeable on posterior neck or back  Psychiatric: Normal mood and affect. speech and behavior is normal. Judgment and thought content normal. Cognition and memory are normal.   Assessment & Plan:   Case and care discussed with Dr. Dalphine Handing. Patient to return in 1 month for f/u of burning and then 6 months for routine follow up.  Please see problem oriented charting.

## 2012-08-08 NOTE — Assessment & Plan Note (Signed)
I am uncertain if patient ever tried elavil.  He has been taking Gabapentin 300mg  qHS.  I asked that he increase gabapentin to 300 BID x 1 week, and if no relief than increase to TID.  Return in 1 month for follow up of symptoms.

## 2012-08-08 NOTE — Patient Instructions (Signed)
Increase Gabapentin to 300mg  (1 tablet) twice daily.  In about 1 week, if symptoms persist, increase frequency to 1 tablet (300mg ) to three times daily.  Please avoid use of heavy machinery or driving when you take this medication until we see its effects on you - it may make you drowsy.  Please be sure to bring all of your medications with you to every visit.  Should you have any new or worsening symptoms, please be sure to call the clinic at [phone].

## 2012-09-08 ENCOUNTER — Encounter: Payer: Self-pay | Admitting: Internal Medicine

## 2012-09-08 ENCOUNTER — Ambulatory Visit (INDEPENDENT_AMBULATORY_CARE_PROVIDER_SITE_OTHER): Payer: Medicare Other | Admitting: Internal Medicine

## 2012-09-08 VITALS — BP 130/81 | HR 52 | Temp 98.0°F | Resp 20 | Ht 71.5 in | Wt 192.7 lb

## 2012-09-08 DIAGNOSIS — M5412 Radiculopathy, cervical region: Secondary | ICD-10-CM

## 2012-09-08 DIAGNOSIS — Z23 Encounter for immunization: Secondary | ICD-10-CM | POA: Diagnosis not present

## 2012-09-08 DIAGNOSIS — E785 Hyperlipidemia, unspecified: Secondary | ICD-10-CM | POA: Diagnosis not present

## 2012-09-08 DIAGNOSIS — I10 Essential (primary) hypertension: Secondary | ICD-10-CM

## 2012-09-08 DIAGNOSIS — K921 Melena: Secondary | ICD-10-CM | POA: Diagnosis not present

## 2012-09-08 DIAGNOSIS — Z0000 Encounter for general adult medical examination without abnormal findings: Secondary | ICD-10-CM

## 2012-09-08 LAB — LIPID PANEL
Cholesterol: 176 mg/dL (ref 0–200)
HDL: 56 mg/dL (ref >39)
LDL Cholesterol: 111 mg/dL — ABNORMAL HIGH (ref 0–99)
Total CHOL/HDL Ratio: 3.1 Ratio

## 2012-09-08 MED ORDER — ZOSTER VACCINE LIVE 19400 UNT/0.65ML ~~LOC~~ SOLR: 0.6500 mL | Freq: Once | SUBCUTANEOUS | Status: DC

## 2012-09-08 NOTE — Patient Instructions (Addendum)
Continue Gabapentin 3 times a day. If pain increases or there is any change in your symptoms, feel free to call the clinic to arrange for an appointment.  If any Flu like symptoms arise (fatigue, fever, muscle pain), please call Clinic to make an appointment.  Shingles vaccination prescription has been written, so please go to any Pharmacy to get their price. Feel free to shop around for the best price!  Please see [name] in 6 months or sooner if needed.

## 2012-09-08 NOTE — Assessment & Plan Note (Signed)
Excellent control today on single agent - HCTZ. Cont. Will add on a BMP but may be too late or not enough blood sample to add on. If cannot, it is OK as his creatine and K are nl and stable.

## 2012-09-08 NOTE — Progress Notes (Signed)
  Subjective:    Patient ID: Greg [name], male    DOB: 10-29-1940, 71 y.o.   MRN: [id number]  HPI  Please see the A&P for the status of the pt's chronic medical problems.   Review of Systems  Musculoskeletal:       Burning and stinging in R posterior neck and shoulder. No weakness, numbness. No rash. No fall. Some shoulder stiffness.        Objective:   Physical Exam        Assessment & Plan:

## 2012-09-08 NOTE — Assessment & Plan Note (Signed)
Dr Everardo Beals addressed this last visit. He was referred but had transportation issues.

## 2012-09-08 NOTE — Progress Notes (Signed)
Subjective:     Patient ID: Greg [name], male   DOB: 22-Jun-1941, 71 y.o.   MRN: [id number]  HPI 71 year old male with a PMH of HTN, hyperlipidemia, and shingles (in 1999 or 2000), presents to the clinic for a follow up visit for a burning sensation on his neck/back on the right. The burning has been present for the past 3 months, with some associated stiffness, and radiates from the posterior neck to the right axilla. There has been no associated rash and no areas of decreased sensation or weakness. The patient was last seen by Dr. Everardo [name] who recommended Gabapentin for the burning BID for 1 week or TID if the burning sensation does not decrease. The patient tried BID and now is on TID Gabapentin and reports that the burning sensation and frequency has decreased. He states the burning comes approximately 2 times per day and only lasts for a second.  Past Medical History  Diagnosis Date  . Hyperlipidemia   . Hypertension   . Epididymitis     right  . Atony of bladder     followed at [facility]; CIC 3x/day, has been treated for culture proven serratia marcesens 05/2011, 06/2011, 07/2011, 09/2011  . [name] (benign prostatic hyperplasia)     hyposensitive bladder, nodular prostate with increased PSA (10.23 in 03/2006), s/p prostate biopsy 2006 that was normal (Dr Greg [name]) he recommended TURP.  . Sinus bradycardia   . Chronic knee pain     left  . Gingivitis   . Right shoulder pain     sx in 06/2005  . Frozen shoulder     left frozen shoulder  . Shingles    Past Surgical History  Procedure Date  . Vasectomy   . Hydrocelectomy 11/14/2010    left hydrocelectomy  . Right shoulder arthroscopic surgery 06/2005  . Transurethral resection of prostate     status post TURP 2/2 [name] in 2008  . Knee surgery     L knee   Social History   .  Marital Status:  Single     Spouse Name:  N/A     Number of Children:  N/A   .  Years of Education:  12    Occupational History   .       retired     Social History Main Topics   .  Smoking status:  Former Smoker     Types:  Cigarettes     Quit date:  02/07/1988   .  Smokeless tobacco:  None   .  Alcohol Use:  No   .  Drug Use:  No   .  Sexually Active:  None    Other Topics  Concern   .  None    Social History Narrative   .  None     Review of Systems  Constitutional: Negative.   HENT: Positive for neck pain (Burning) and neck stiffness. Negative for trouble swallowing.   Eyes: Negative.   Respiratory: Negative.   Cardiovascular: Negative.   Gastrointestinal: Negative.   Genitourinary: Negative.   Musculoskeletal: Positive for back pain (Burning sensation radiating from neck to right axilla). Negative for myalgias, joint swelling, arthralgias and gait problem.  Neurological: Negative for dizziness, weakness, numbness and headaches.       Objective:   Physical Exam  Constitutional: He is oriented to person, place, and time. He appears well-nourished.  HENT:  Head: Normocephalic and atraumatic.  Right Ear: External ear normal.  Left Ear: External  ear normal.  Eyes: Right eye exhibits no discharge. Left eye exhibits no discharge. No scleral icterus.  Neck: Normal range of motion. Neck supple.    Musculoskeletal: Normal range of motion. He exhibits no edema and no tenderness.  Lymphadenopathy:    He has no cervical adenopathy.  Neurological: He is alert and oriented to person, place, and time. No cranial nerve deficit. Coordination normal.  Skin: No rash noted. No erythema. No pallor.  Psychiatric: He has a normal mood and affect. His behavior is normal.   Filed Vitals:   09/08/12 0819  BP: 130/81  Pulse: 52  Temp: 98 F (36.7 C)  Resp: 20   05/22/2012 MRI Cervical Spine wo contrast: 1. 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  2. Autologous fusion of the C4-5 disc interspace with endplate   spurring causes some flattening of the ventral cord. Uncovertebral  disease causes marked bilateral foraminal narrowing, worse on left.  3. Flattening of the cord and severe bilateral foraminal narrowing  C3-4.  4. Right worse left foraminal narrowing and mild deformity the  ventral cord at C5-6.  5. Mild deformity of the ventral cord at C7-T1 due to a small left  paracentral protrusion.  Lipid Panel     Component Value Date/Time   CHOL 148 10/01/2011 0920   TRIG 55 10/01/2011 0920   HDL 55 10/01/2011 0920   CHOLHDL 2.7 10/01/2011 0920   VLDL 11 10/01/2011 0920   LDLCALC 82 10/01/2011 0920       Assessment:     71 year old male with a PMH of shingles who presents to the clinic for a follow up visit regarding his burning sensation on his neck.  1. Burning sensation on neck radiating to right axilla - He has been taking Gabapentin for the past 3 weeks which has decreased the burning sensation. He states the burning comes approximately 2 times per day and only lasts for a second. The pain is likely due to cord changes found on MRI. This presentation is unlikely due to shingles as the patient first experienced shingles over 10 years ago and the burning/pain would not suddenly reappear after appropriate therapy.  2. History of shingles - Last episode in 1999 or 2000. On physical exam no vesicles were present and the presentation of this burning sensation is unlikely associated with shingles/post herpetic neuralgia.     Plan:     1. Continue Gabapentin TID indefinitely.  2. Prescription for shingles vaccine (Zostavax). Patient will go to his preferred Pharmacy for the vaccine as Medicare does not pay for the vaccination.  2. Patient's last Lipid panel was in 2012, therefore lipid panel was redrawn today. Results pending. 3. Follow up visit in 6 months for routine care and reassessing the burning sensation on neck. 4. Follow up with PCP regarding routine colonoscopy and abdominal  ultrasound (patient was a past smoker therefore AAA screening is needed). He was scheduled for a colonoscopy in April 2012, but had transportation problems therefore it was never done.

## 2012-09-08 NOTE — Assessment & Plan Note (Signed)
Pt is on a statin. Will check FLP today since almost due and will not be back for routine care for another 6 months.

## 2012-09-08 NOTE — Assessment & Plan Note (Addendum)
Pt was / is concerned that this is shingles related. This is unlikely as he had shingles in 2000 and had no pain (PHN) afterwards. It would be unlikely to suddenly get PHN 10 years later.   He has burning, stinging, and stiffness of the R posterior neck and shoulder. It is getting better on the gabapentin which he titrated up to 100 TID one week ago. His MRI of the cervical spine does show C4-C5 cord involvement that could explain his sxs. He has had overall improvement on the gabapentin. He was unable to quantify the number and duration of episodes when he first developed the pain but is now only 2 episodes per day and they only last a couple of seconds. They occur with rest or when getting out of bed. He has no weakness. He is right handed and the pain is on th right and he has not had any trouble dropping things. He was concerned bc he thought the gabapentin would complete take away the pain.   Since he has no weakness and is getting good relief with the gabapentin, will cont and pt will F/U Dr Everardo Beals in 6 months for routine care. He will call if needs to be seen sooner.

## 2012-09-08 NOTE — Assessment & Plan Note (Signed)
Prescription for shingles vaccine was given to pt today and explained that he will have to pay out of pocket.

## 2012-09-09 ENCOUNTER — Encounter: Payer: Self-pay | Admitting: Internal Medicine

## 2012-09-09 LAB — BASIC METABOLIC PANEL WITH GFR
BUN: 10 mg/dL (ref 6–23)
CO2: 29 mEq/L (ref 19–32)
Creat: 1 mg/dL (ref 0.50–1.35)
GFR, Est African American: 87 mL/min
GFR, Est Non African American: 75 mL/min
Glucose, Bld: 84 mg/dL (ref 70–99)
Potassium: 4.1 mEq/L (ref 3.5–5.3)
Sodium: 144 mEq/L (ref 135–145)

## 2012-10-07 DIAGNOSIS — R300 Dysuria: Secondary | ICD-10-CM | POA: Diagnosis not present

## 2012-10-20 DIAGNOSIS — R972 Elevated prostate specific antigen [PSA]: Secondary | ICD-10-CM | POA: Diagnosis not present

## 2012-10-20 DIAGNOSIS — R300 Dysuria: Secondary | ICD-10-CM | POA: Diagnosis not present

## 2012-10-20 DIAGNOSIS — N3020 Other chronic cystitis without hematuria: Secondary | ICD-10-CM | POA: Diagnosis not present

## 2012-10-20 DIAGNOSIS — N312 Flaccid neuropathic bladder, not elsewhere classified: Secondary | ICD-10-CM | POA: Diagnosis not present

## 2012-11-14 ENCOUNTER — Ambulatory Visit (INDEPENDENT_AMBULATORY_CARE_PROVIDER_SITE_OTHER): Payer: Medicare Other | Admitting: Internal Medicine

## 2012-11-14 ENCOUNTER — Encounter: Payer: Self-pay | Admitting: Internal Medicine

## 2012-11-14 VITALS — BP 133/82 | HR 54 | Temp 97.1°F | Ht 71.0 in | Wt 197.0 lb

## 2012-11-14 DIAGNOSIS — E785 Hyperlipidemia, unspecified: Secondary | ICD-10-CM | POA: Diagnosis not present

## 2012-11-14 MED ORDER — CLOTRIMAZOLE 1 % EX CREA: TOPICAL_CREAM | Freq: Two times a day (BID) | CUTANEOUS | Status: DC

## 2012-11-14 MED ORDER — PRAVASTATIN SODIUM 20 MG PO TABS: 20.0000 mg | ORAL_TABLET | Freq: Every day | ORAL | Status: DC

## 2012-11-14 NOTE — Progress Notes (Signed)
Subjective:   Patient ID: Greg [name] male   DOB: 04/11/1941 72 y.o.   MRN: [id number]  HPI: Greg [name] is a 72 y.o.  Made with a past medical history of cervical radiculopathy confirmed on MRI most recent 8/13, hypertension and hyperlipidemia coming in acutely for "burning neck rash." Patient does have a history of zoster back in 1999 and 2000 and which were last outbreaks patient recently received shingles vaccine last week at a pharmacy. Patient states that he and gabapentin twice a day has helped with the "burning/itching neck pain." Patient states that the rash started about 2-3 weeks ago along his L3-L4 dermatome bilaterally was itchy in nature and scaling. Patient has only tried over-the-counter lotions the provided only temporary relief and the gabapentin. Patient has had no fevers/chills or other constitutional symptoms at this time. He has not been exposed to anyone else in the home and has rashes, doesn't remember using any new soaps or lotions or laundry detergent.    Past Medical History  Diagnosis Date  . Hyperlipidemia   . Hypertension   . Epididymitis     right  . Atony of bladder     followed at [facility]; CIC 3x/day, has been treated for culture proven serratia marcesens 05/2011, 06/2011, 07/2011, 09/2011  . [name] (benign prostatic hyperplasia)     hyposensitive bladder, nodular prostate with increased PSA (10.23 in 03/2006), s/p prostate biopsy 2006 that was normal ([name]) he recommended TURP.  . Sinus bradycardia   . Chronic knee pain     left  . Gingivitis   . Right shoulder pain     sx in 06/2005  . Frozen shoulder     left frozen shoulder  . Shingles    Current Outpatient Prescriptions  Medication Sig Dispense Refill  . gabapentin (NEURONTIN) 300 MG capsule Take 1 tablet (300mg ) twice daily for the next week, if no relief, increase to 1 tablet (300mg ) three times daily.  90 capsule  3  . hydrochlorothiazide (HYDRODIURIL) 25 MG tablet Take 1  tablet (25 mg total) by mouth daily.  31 tablet  11  . methenamine (MANDELAMINE) 0.5 GM tablet       . pravastatin (PRAVACHOL) 20 MG tablet Take 1 tablet (20 mg total) by mouth at bedtime.  30 tablet  11   No family history on file. History   Social History  . Marital Status: Single    Spouse Name: N/A    Number of Children: N/A  . Years of Education: 12   Occupational History  .      retired   Social History Main Topics  . Smoking status: Former Smoker    Types: Cigarettes    Quit date: 02/07/1988  . Smokeless tobacco: None  . Alcohol Use: No  . Drug Use: No  . Sexually Active: None   Other Topics Concern  . None   Social History Narrative  . None   Review of Systems: otherwise negative unless listed in HPI  Objective:  Physical Exam: Filed Vitals:   11/14/12 0934  BP: 133/82  Pulse: 54  Temp: 97.1 F (36.2 C)  TempSrc: Oral  Height: 5\' 11"  (1.803 m)  Weight: 197 lb (89.359 kg)  SpO2: 98%   General: well nourished HEENT: PERRL, EOMI, no scleral icterus Cardiac: RRR, no rubs, murmurs or gallops Pulm: clear to auscultation bilaterally, moving normal volumes of air Abd: soft, nontender, nondistended, BS present Ext: warm and well perfused, no pedal edema, circular hypopigmented scaling  patch on Left neck, similar circular hypopigmented scaling plaque on L3-L4 bilateral dermatome, no vesicles, non-tender to palpation Neuro: alert and oriented X3, cranial nerves II-XII grossly intact  Assessment & Plan:  1. tinea corpus: pt describes having pruritic, circular or oval, erythematous, scaling patch or plaque that appear to spread centrifugally on PE -lotrim cream -Patient advised to return in 2 weeks if rash did not begin to improve or if vesicles begin to appear  2. Cervical radiculopathy: pt continues to have "burning sensation" and been taking gabapentin 300mg  BID. Per chart review [name] did indicate that patient could uptitrate to 3 times a day. Patient  was educated on this and told that could take 300 mg 3 times a day.  Patient also needed a refill for pravastatin as he has changed pharmacies.  Patient discussed with Dr. Dalphine Handing

## 2012-11-20 DIAGNOSIS — N312 Flaccid neuropathic bladder, not elsewhere classified: Secondary | ICD-10-CM | POA: Diagnosis not present

## 2012-11-20 DIAGNOSIS — N3020 Other chronic cystitis without hematuria: Secondary | ICD-10-CM | POA: Diagnosis not present

## 2013-01-09 ENCOUNTER — Emergency Department (HOSPITAL_COMMUNITY)
Admission: EM | Admit: 2013-01-09 | Discharge: 2013-01-09 | Disposition: A | Discharge status: Home / Self Care | Payer: Medicare Other | Attending: Emergency Medicine | Admitting: Emergency Medicine

## 2013-01-09 ENCOUNTER — Encounter (HOSPITAL_COMMUNITY): Payer: Self-pay

## 2013-01-09 DIAGNOSIS — R208 Other disturbances of skin sensation: Secondary | ICD-10-CM

## 2013-01-09 DIAGNOSIS — Z8619 Personal history of other infectious and parasitic diseases: Secondary | ICD-10-CM | POA: Diagnosis not present

## 2013-01-09 DIAGNOSIS — I10 Essential (primary) hypertension: Secondary | ICD-10-CM | POA: Diagnosis not present

## 2013-01-09 DIAGNOSIS — M5412 Radiculopathy, cervical region: Secondary | ICD-10-CM

## 2013-01-09 DIAGNOSIS — Z8739 Personal history of other diseases of the musculoskeletal system and connective tissue: Secondary | ICD-10-CM | POA: Diagnosis not present

## 2013-01-09 DIAGNOSIS — Z79899 Other long term (current) drug therapy: Secondary | ICD-10-CM | POA: Insufficient documentation

## 2013-01-09 DIAGNOSIS — Z87448 Personal history of other diseases of urinary system: Secondary | ICD-10-CM | POA: Diagnosis not present

## 2013-01-09 DIAGNOSIS — Z87891 Personal history of nicotine dependence: Secondary | ICD-10-CM | POA: Diagnosis not present

## 2013-01-09 DIAGNOSIS — M2550 Pain in unspecified joint: Secondary | ICD-10-CM | POA: Insufficient documentation

## 2013-01-09 DIAGNOSIS — G8929 Other chronic pain: Secondary | ICD-10-CM | POA: Insufficient documentation

## 2013-01-09 DIAGNOSIS — E785 Hyperlipidemia, unspecified: Secondary | ICD-10-CM | POA: Diagnosis not present

## 2013-01-09 DIAGNOSIS — Z8679 Personal history of other diseases of the circulatory system: Secondary | ICD-10-CM | POA: Insufficient documentation

## 2013-01-09 DIAGNOSIS — M25569 Pain in unspecified knee: Secondary | ICD-10-CM | POA: Insufficient documentation

## 2013-01-09 DIAGNOSIS — R209 Unspecified disturbances of skin sensation: Secondary | ICD-10-CM | POA: Diagnosis not present

## 2013-01-09 NOTE — ED Notes (Signed)
Patient states his back has been "breaking out for a couple of months, I took the shingles vaccine".  Patient complains that his back "stings".  No rash appreciated.

## 2013-01-09 NOTE — ED Notes (Signed)
Pt presents with 2 month h/o "burning" rash to lower back up to his neck.  Pt reports he received shingles vaccine x 2 months but reports having rash before vaccine was given. Pt denies any new topicals, soaps or detergents.  No other family member with same.

## 2013-01-10 NOTE — ED Provider Notes (Signed)
History     CSN: [id number]  Arrival date & time 01/09/13  1124   First MD Initiated Contact with Patient 01/09/13 1250      No chief complaint on file.   (Consider location/radiation/quality/duration/timing/severity/associated sxs/prior treatment) HPI Comments: Patient presents today for what he calls a rash.  Patient reports that he has had a burning sensation of the posterior neck that goes into the right arm.  He reports that this has been present for the several months.  He has not actually noticed a rash, but is concerned that he may have shingles.  He has had shingles previously.   Review of the chart shows that the patient has been seen by his PCP for this several times since October of 2013.  His PCP felt that this is most likely Cervical Radiculopathy.  MRI of the cervical spine done in August of 2013 confirmed Cervical Radiculopathy.  Patient has been taking Gabapentin.  Patient denies fever or chills.  Denies numbness.  Denies weakness.    The history is provided by the patient.    Past Medical History  Diagnosis Date  . Hyperlipidemia   . Hypertension   . Epididymitis     right  . Atony of bladder     followed at [facility]; CIC 3x/day, has been treated for culture proven serratia marcesens 05/2011, 06/2011, 07/2011, 09/2011  . [name] (benign prostatic hyperplasia)     hyposensitive bladder, nodular prostate with increased PSA (10.23 in 03/2006), s/p prostate biopsy 2006 that was normal ([name]) he recommended TURP.  . Sinus bradycardia   . Chronic knee pain     left  . Gingivitis   . Right shoulder pain     sx in 06/2005  . Frozen shoulder     left frozen shoulder  . Shingles     Past Surgical History  Procedure Laterality Date  . Vasectomy    . Hydrocelectomy  11/14/2010    left hydrocelectomy  . Right shoulder arthroscopic surgery  06/2005  . Transurethral resection of prostate      status post TURP 2/2 [name] in 2008  . Knee surgery      L knee     History reviewed. No pertinent family history.  History  Substance Use Topics  . Smoking status: Former Smoker    Types: Cigarettes    Quit date: 02/07/1988  . Smokeless tobacco: Not on file  . Alcohol Use: No      Review of Systems  Constitutional: Negative for fever and chills.  Skin:       Burning sensation of the posterior neck  Neurological: Negative for numbness.    Allergies  Ciprofloxacin hcl and Ibuprofen  Home Medications   Current Outpatient Rx  Name  Route  Sig  Dispense  Refill  . gabapentin (NEURONTIN) 300 MG capsule   Oral   Take 300 mg by mouth 2 (two) times daily.         . hydrochlorothiazide (HYDRODIURIL) 25 MG tablet   Oral   Take 1 tablet (25 mg total) by mouth daily.   31 tablet   11   . pravastatin (PRAVACHOL) 20 MG tablet   Oral   Take 1 tablet (20 mg total) by mouth at bedtime.   30 tablet   11     BP 134/84  Pulse 62  Temp(Src) 98.1 F (36.7 C) (Oral)  Resp 18  SpO2 98%  Physical Exam  Nursing note and vitals reviewed. Constitutional: He  appears well-developed and well-nourished. No distress.  HENT:  Head: Normocephalic and atraumatic.  Mouth/Throat: Oropharynx is clear and moist.  Neck: Normal range of motion. Neck supple.  Cardiovascular: Normal rate, regular rhythm and normal heart sounds.   Pulmonary/Chest: Effort normal and breath sounds normal.  Musculoskeletal: Normal range of motion. He exhibits no edema and no tenderness.  Neurological: He is alert. No sensory deficit. Gait normal.  Grip strength 5/5 bilaterally  Skin: Skin is warm and dry. No rash noted. He is not diaphoretic.  No rash visualized or palpated  Psychiatric: He has a normal mood and affect.    ED Course  Procedures (including critical care time)  Labs Reviewed - No data to display No results found.   1. Cervical radiculopathy   2. Burning sensation       MDM  Patient presents with a burning sensation of his posterior neck for  the past several months.  No rash present.  Review of the chart shows that he has seen his PCP for this several times and has been diagnosed with Cervical Radiculopathy, which was confirmed on a MRI done in August.  Feel that the patient is stable for discharge.  Patient instructed to follow up with PCP.        Pascal Lux Arena, PA-C 01/11/13 2042

## 2013-01-12 ENCOUNTER — Other Ambulatory Visit: Payer: Self-pay | Admitting: *Deleted

## 2013-01-12 NOTE — ED Provider Notes (Signed)
Medical screening examination/treatment/procedure(s) were performed by non-physician practitioner and as supervising physician I was immediately available for consultation/collaboration.  Flint Melter, MD 01/12/13 [phone]

## 2013-01-13 MED ORDER — GABAPENTIN 300 MG PO CAPS: 300.0000 mg | ORAL_CAPSULE | Freq: Two times a day (BID) | ORAL | Status: DC

## 2013-01-15 DIAGNOSIS — R300 Dysuria: Secondary | ICD-10-CM | POA: Diagnosis not present

## 2013-01-15 DIAGNOSIS — N390 Urinary tract infection, site not specified: Secondary | ICD-10-CM | POA: Diagnosis not present

## 2013-02-05 DIAGNOSIS — R972 Elevated prostate specific antigen [PSA]: Secondary | ICD-10-CM | POA: Diagnosis not present

## 2013-02-10 DIAGNOSIS — R972 Elevated prostate specific antigen [PSA]: Secondary | ICD-10-CM | POA: Diagnosis not present

## 2013-02-10 DIAGNOSIS — N390 Urinary tract infection, site not specified: Secondary | ICD-10-CM | POA: Diagnosis not present

## 2013-02-11 ENCOUNTER — Other Ambulatory Visit: Payer: Self-pay | Admitting: Internal Medicine

## 2013-03-17 ENCOUNTER — Encounter (HOSPITAL_COMMUNITY): Payer: Self-pay | Admitting: *Deleted

## 2013-03-17 DIAGNOSIS — N390 Urinary tract infection, site not specified: Secondary | ICD-10-CM | POA: Diagnosis not present

## 2013-03-17 DIAGNOSIS — Z9889 Other specified postprocedural states: Secondary | ICD-10-CM | POA: Diagnosis not present

## 2013-03-17 DIAGNOSIS — R319 Hematuria, unspecified: Secondary | ICD-10-CM | POA: Insufficient documentation

## 2013-03-17 DIAGNOSIS — Z8719 Personal history of other diseases of the digestive system: Secondary | ICD-10-CM | POA: Insufficient documentation

## 2013-03-17 DIAGNOSIS — Z87891 Personal history of nicotine dependence: Secondary | ICD-10-CM | POA: Diagnosis not present

## 2013-03-17 DIAGNOSIS — Z79899 Other long term (current) drug therapy: Secondary | ICD-10-CM | POA: Insufficient documentation

## 2013-03-17 DIAGNOSIS — I10 Essential (primary) hypertension: Secondary | ICD-10-CM | POA: Insufficient documentation

## 2013-03-17 DIAGNOSIS — Z8679 Personal history of other diseases of the circulatory system: Secondary | ICD-10-CM | POA: Diagnosis not present

## 2013-03-17 DIAGNOSIS — G8929 Other chronic pain: Secondary | ICD-10-CM | POA: Diagnosis not present

## 2013-03-17 DIAGNOSIS — Z87448 Personal history of other diseases of urinary system: Secondary | ICD-10-CM | POA: Diagnosis not present

## 2013-03-17 DIAGNOSIS — Z8739 Personal history of other diseases of the musculoskeletal system and connective tissue: Secondary | ICD-10-CM | POA: Insufficient documentation

## 2013-03-17 DIAGNOSIS — Z9852 Vasectomy status: Secondary | ICD-10-CM | POA: Insufficient documentation

## 2013-03-17 DIAGNOSIS — Z8619 Personal history of other infectious and parasitic diseases: Secondary | ICD-10-CM | POA: Insufficient documentation

## 2013-03-17 NOTE — ED Notes (Signed)
NURSE FIRST ROUNDS : NURSE EXPLAINED DELAY , WAIT TIME AND PROCESS , RESPIRATIONS UNLABORED , DENIES PAIN , SITTING AT WAITING AREA WITH NO DISTRESS.

## 2013-03-17 NOTE — ED Notes (Signed)
UNABLE TO GIVE URINE SPECIMEN AT THIS TIME.

## 2013-03-17 NOTE — ED Notes (Signed)
Pt is here with hematuria when he was self-cathing self and states he has some intermittent LLQ pain

## 2013-03-18 ENCOUNTER — Emergency Department (HOSPITAL_COMMUNITY)
Admission: EM | Admit: 2013-03-18 | Discharge: 2013-03-18 | Disposition: A | Discharge status: Home / Self Care | Payer: Medicare Other | Attending: Emergency Medicine | Admitting: Emergency Medicine

## 2013-03-18 DIAGNOSIS — R319 Hematuria, unspecified: Secondary | ICD-10-CM

## 2013-03-18 DIAGNOSIS — N390 Urinary tract infection, site not specified: Secondary | ICD-10-CM

## 2013-03-18 LAB — URINALYSIS, ROUTINE W REFLEX MICROSCOPIC
Bilirubin Urine: NEGATIVE
Glucose, UA: NEGATIVE mg/dL
Specific Gravity, Urine: 1.006 (ref 1.005–1.030)

## 2013-03-18 MED ORDER — CEPHALEXIN 500 MG PO CAPS: 500.0000 mg | ORAL_CAPSULE | Freq: Four times a day (QID) | ORAL | Status: DC

## 2013-03-18 NOTE — ED Provider Notes (Signed)
History     CSN: [id number]  Arrival date & time 03/17/13  2054   First MD Initiated Contact with Patient 03/18/13 0037      Chief Complaint  Patient presents with  . Hematuria    (Consider location/radiation/quality/duration/timing/severity/associated sxs/prior treatment) Patient is a 72 y.o. male presenting with hematuria. The history is provided by the patient.  Hematuria This is a new problem. The current episode started 3 to 5 hours ago. Episode frequency: Single episode, while catheterizing, self. The problem has not changed since onset.Associated symptoms comments: No fever, chills, nausea, vomiting, weakness, or dizziness. No abdominal pain or cramping. No flank pain.. Nothing aggravates the symptoms. He has tried nothing for the symptoms.    Past Medical History  Diagnosis Date  . Hyperlipidemia   . Hypertension   . Epididymitis     right  . Atony of bladder     followed at [facility]; CIC 3x/day, has been treated for culture proven serratia marcesens 05/2011, 06/2011, 07/2011, 09/2011  . [name] (benign prostatic hyperplasia)     hyposensitive bladder, nodular prostate with increased PSA (10.23 in 03/2006), s/p prostate biopsy 2006 that was normal ([name]) he recommended TURP.  . Sinus bradycardia   . Chronic knee pain     left  . Gingivitis   . Right shoulder pain     sx in 06/2005  . Frozen shoulder     left frozen shoulder  . Shingles     Past Surgical History  Procedure Laterality Date  . Vasectomy    . Hydrocelectomy  11/14/2010    left hydrocelectomy  . Right shoulder arthroscopic surgery  06/2005  . Transurethral resection of prostate      status post TURP 2/2 [name] in 2008  . Knee surgery      L knee    No family history on file.  History  Substance Use Topics  . Smoking status: Former Smoker    Types: Cigarettes    Quit date: 02/07/1988  . Smokeless tobacco: Not on file  . Alcohol Use: No      Review of Systems  Genitourinary:  Positive for hematuria.  All other systems reviewed and are negative.    Allergies  Ciprofloxacin hcl and Ibuprofen  Home Medications   Current Outpatient Rx  Name  Route  Sig  Dispense  Refill  . gabapentin (NEURONTIN) 300 MG capsule   Oral   Take 1 capsule (300 mg total) by mouth 2 (two) times daily.   60 capsule   3   . hydrochlorothiazide (HYDRODIURIL) 25 MG tablet   Oral   Take 25 mg by mouth daily.         . pravastatin (PRAVACHOL) 20 MG tablet   Oral   Take 1 tablet (20 mg total) by mouth at bedtime.   30 tablet   11   . cephALEXin (KEFLEX) 500 MG capsule   Oral   Take 1 capsule (500 mg total) by mouth 4 (four) times daily.   28 capsule   0     BP 134/92  Pulse 50  Temp(Src) 98 F (36.7 C) (Oral)  Resp 16  SpO2 97%  Physical Exam  Nursing note and vitals reviewed. Constitutional: He is oriented to person, place, and time. He appears well-developed and well-nourished.  HENT:  Head: Normocephalic and atraumatic.  Right Ear: External ear normal.  Left Ear: External ear normal.  Eyes: Conjunctivae and EOM are normal. Pupils are equal, round, and reactive  to light.  Neck: Normal range of motion and phonation normal. Neck supple.  Cardiovascular: Normal rate, regular rhythm, normal heart sounds and intact distal pulses.   Pulmonary/Chest: Effort normal and breath sounds normal. He exhibits no bony tenderness.  Abdominal: Soft. Normal appearance. There is no tenderness.  Genitourinary:  No costovertebral angle tenderness  Musculoskeletal: Normal range of motion.  Neurological: He is alert and oriented to person, place, and time. He has normal strength. No cranial nerve deficit or sensory deficit. He exhibits normal muscle tone. Coordination normal.  Skin: Skin is warm, dry and intact.  Psychiatric: He has a normal mood and affect. His behavior is normal. Judgment and thought content normal.    ED Course  Procedures (including critical care time)  In  and out catheter ordered.    Labs Reviewed  URINALYSIS, ROUTINE W REFLEX MICROSCOPIC - Abnormal; Notable for the following:    Hgb urine dipstick LARGE (*)    Leukocytes, UA SMALL (*)    All other components within normal limits  URINE CULTURE  URINE MICROSCOPIC-ADD ON   No results found.   1. Hematuria   2. UTI (lower urinary tract infection)       MDM  Hematuria secondary to self-catheterization. Possible urinary tract infection, culture is pending. Doubt metabolic instability, serious bacterial infection or impending vascular collapse; the patient is stable for discharge.   Nursing Notes Reviewed/ Care Coordinated, and agree without changes. Applicable Imaging Reviewed.  Interpretation of Laboratory Data incorporated into ED treatment   Plan: Home Medications- Keflex; Home Treatments- Fluids; Recommended follow up- Urology check up in 1 week         Flint Melter, MD 03/18/13 [phone]

## 2013-03-18 NOTE — ED Notes (Addendum)
Pt had a TURP procedure done over a year ago, states he has been self-catheterizing twice a day for the past year without complications.  Pt was instructed to call doctor if he ever developed bloody urine.  Pt self-catheterized today around 8pm, bloody urine returned.  Pt also complaining of intermittent LLQ pain for the past couple days.

## 2013-03-19 LAB — URINE CULTURE
Colony Count: >=100000
Special Requests: NORMAL

## 2013-03-20 NOTE — ED Notes (Signed)
Post ED Visit - Positive Culture Follow-up  Culture report reviewed by antimicrobial stewardship pharmacist: []  Wes Dulaney, Pharm.D., BCPS []  Celedonio Miyamoto, Pharm.D., BCPS []  Georgina Pillion, Pharm.D., BCPS [x]  [place], [place].D., BCPS, AAHIVP []  Estella Husk, Pharm.D., BCPS, AAHIV  Positive urine culture Treated with Cephalexin, organism sensitive to the same and no further patient follow-up is required at this time.  Larena Sox 03/20/2013, 3:22 PM

## 2013-04-08 DIAGNOSIS — R300 Dysuria: Secondary | ICD-10-CM | POA: Diagnosis not present

## 2013-04-08 DIAGNOSIS — N390 Urinary tract infection, site not specified: Secondary | ICD-10-CM | POA: Diagnosis not present

## 2013-04-22 ENCOUNTER — Ambulatory Visit (INDEPENDENT_AMBULATORY_CARE_PROVIDER_SITE_OTHER): Payer: Medicare Other | Admitting: Internal Medicine

## 2013-04-22 ENCOUNTER — Encounter: Payer: Self-pay | Admitting: Internal Medicine

## 2013-04-22 VITALS — BP 132/83 | HR 53 | Temp 97.5°F | Ht 72.3 in | Wt 196.3 lb

## 2013-04-22 DIAGNOSIS — Z87448 Personal history of other diseases of urinary system: Secondary | ICD-10-CM | POA: Diagnosis not present

## 2013-04-22 DIAGNOSIS — R319 Hematuria, unspecified: Secondary | ICD-10-CM

## 2013-04-22 DIAGNOSIS — R21 Rash and other nonspecific skin eruption: Secondary | ICD-10-CM | POA: Insufficient documentation

## 2013-04-22 NOTE — Progress Notes (Signed)
  Subjective:    Patient ID: Greg [name], male    DOB: 07-20-1941, 72 y.o.   MRN: [id number]  HPI Comments: 72 y.o PMH HTN (BP 132/85), HLD presents for bumps on the back of his neck which are stinging and painful and he has a history of shingles.  He states bumps have been present on and off for 6 months.  He has uses Clotrimazole in the past they may have helped some and he may not have been breaking out as bad.  He is wondering if Gabapentin is causing him to break out in a rash.  He states he wants to get off this medication as it not really helping for pain in his neck which is resolved.  He states he uses Vasoline for moisturizer and Gain to wash his clothes.  He was recently seen in the ED for Staph UTI and given Keflex which he completed.  He states due to [name] he follows with urology and in/out caths himself to pee.  ED noted hematuria which is chronic and he follows with urology though does not grossly see hematuria.  He is taking Uriebel which turns his urine blue and helps with dysuria.        Review of Systems  Genitourinary: Negative for hematuria.  Musculoskeletal:       Denies neck pain   Skin: Positive for rash.       Objective:   Physical Exam  Nursing note and vitals reviewed. Constitutional: He is oriented to person, place, and time. Vital signs are normal. He appears well-developed and well-nourished. He is cooperative.  HENT:  Head: Normocephalic and atraumatic.  Mouth/Throat: No oropharyngeal exudate.  Eyes: Conjunctivae are normal. Pupils are equal, round, and reactive to light. Right eye exhibits no discharge. Left eye exhibits no discharge. No scleral icterus.  Cardiovascular: Normal rate, regular rhythm, S1 normal, S2 normal and normal heart sounds.   No murmur heard. Pulmonary/Chest: Effort normal and breath sounds normal.  Abdominal: Soft. Bowel sounds are normal. He exhibits no distension. There is no tenderness.  Musculoskeletal: He exhibits no edema.    Neurological: He is alert and oriented to person, place, and time. Gait normal.  Skin: Skin is warm and dry. Rash noted.     Psychiatric: He has a normal mood and affect. His speech is normal and behavior is normal. Judgment and thought content normal. Cognition and memory are normal.          Assessment & Plan:  F/u 08/2013 for overall exam with PCP

## 2013-04-22 NOTE — Assessment & Plan Note (Signed)
Chronic issue may be 2/2 to self catheterization Follow up with urology as scheduled.  They are aware of hematuria as it is noted in office visits

## 2013-04-22 NOTE — Assessment & Plan Note (Signed)
?  related to Gabapentin or laundry detergent appears eczematous.  Patient does not think Gabapentin helpful for neck pain which is resolved and wants to taper off Gabapentin due to improved neck pain and ROM.   Take Gabapentin 300 mg daily for a week then stop  over the counter Hydrocortisone for itching rash to neck  F/u in 08/2013 for other medical problems

## 2013-04-22 NOTE — Patient Instructions (Addendum)
Take Gabapentin 300 mg daily for a week then stop  Take Tylenol for pain if needed  You can take over the counter Hydrocortisone for itching rash to neck  F/u in 08/2013 for other medical problems  Follow up with urology as scheduled   Gabapentin capsules or tablets What is this medicine? GABAPENTIN (GA ba pen tin) is used to control partial seizures in adults with epilepsy. It is also used to treat certain types of nerve pain. This medicine may be used for other purposes; ask your health care provider or pharmacist if you have questions. What should I tell my health care provider before I take this medicine? They need to know if you have any of these conditions: -kidney disease -suicidal thoughts, plans, or attempt; a previous suicide attempt by you or a family member -an unusual or allergic reaction to gabapentin, other medicines, foods, dyes, or preservatives -pregnant or trying to get pregnant -breast-feeding How should I use this medicine? Take this medicine by mouth. Swallow it with a drink of water. Follow the directions on the prescription label. If this medicine upsets your stomach, take it with food or milk. Take your medicine at regular intervals. Do not take it more often than directed. If you are directed to break the 600 or 800 mg tablets in half as part of your dose, the extra half tablet should be used for the next dose. If you have not used the extra half tablet within 3 days, it should be thrown away. A special MedGuide will be given to you by the pharmacist with each prescription and refill. Be sure to read this information carefully each time. Talk to your pediatrician regarding the use of this medicine in children. Special care may be needed. Overdosage: If you think you have taken too much of this medicine contact a poison control center or emergency room at once. NOTE: This medicine is only for you. Do not share this medicine with others. What if I miss a dose? If you  miss a dose, take it as soon as you can. If it is almost time for your next dose, take only that dose. Do not take double or extra doses. What may interact with this medicine? -antacids -hydrocodone -morphine -naproxen -sevelamer This list may not describe all possible interactions. Give your health care provider a list of all the medicines, herbs, non-prescription drugs, or dietary supplements you use. Also tell them if you smoke, drink alcohol, or use illegal drugs. Some items may interact with your medicine. What should I watch for while using this medicine? Visit your doctor or health care professional for regular checks on your progress. You may want to keep a record at home of how you feel your condition is responding to treatment. You may want to share this information with your doctor or health care professional at each visit. You should contact your doctor or health care professional if your seizures get worse or if you have any new types of seizures. Do not stop taking this medicine or any of your seizure medicines unless instructed by your doctor or health care professional. Stopping your medicine suddenly can increase your seizures or their severity. Wear a medical identification bracelet or chain if you are taking this medicine for seizures, and carry a card that lists all your medications. You may get drowsy, dizzy, or have blurred vision. Do not drive, use machinery, or do anything that needs mental alertness until you know how this medicine affects you. To  reduce dizzy or fainting spells, do not sit or stand up quickly, especially if you are an older patient. Alcohol can increase drowsiness and dizziness. Avoid alcoholic drinks. Your mouth may get dry. Chewing sugarless gum or sucking hard candy, and drinking plenty of water will help. The use of this medicine may increase the chance of suicidal thoughts or actions. Pay special attention to how you are responding while on this medicine.  Any worsening of mood, or thoughts of suicide or dying should be reported to your health care professional right away. Women who become pregnant while using this medicine may enroll in the [place] American Antiepileptic Drug Pregnancy Registry by calling [phone]. This registry collects information about the safety of antiepileptic drug use during pregnancy. What side effects may I notice from receiving this medicine? Side effects that you should report to your doctor or health care professional as soon as possible: -allergic reactions like skin rash, itching or hives, swelling of the face, lips, or tongue -worsening of mood, thoughts or actions of suicide or dying Side effects that usually do not require medical attention (report to your doctor or health care professional if they continue or are bothersome): -constipation -difficulty walking or controlling muscle movements -nausea -slurred speech -tremors -weight gain This list may not describe all possible side effects. Call your doctor for medical advice about side effects. You may report side effects to FDA at 1-800-FDA-1088. Where should I keep my medicine? Keep out of reach of children. Store at room temperature between 15 and 30 degrees C (59 and 86 degrees F). Throw away any unused medicine after the expiration date. NOTE: This sheet is a summary. It may not cover all possible information. If you have questions about this medicine, talk to your doctor, pharmacist, or health care provider.  2012, Elsevier/Gold Standard. (06/06/2010 6:06:26 PM) Rash A rash is a change in the color or texture of your skin. There are many different types of rashes. You may have other problems that accompany your rash. CAUSES   Infections.  Allergic reactions. This can include allergies to pets or foods.  Certain medicines.  Exposure to certain chemicals, soaps, or cosmetics.  Heat.  Exposure to poisonous plants.  Tumors, both cancerous and  noncancerous. SYMPTOMS   Redness.  Scaly skin.  Itchy skin.  Dry or cracked skin.  Bumps.  Blisters.  Pain. DIAGNOSIS  Your caregiver may do a physical exam to determine what type of rash you have. A skin sample (biopsy) may be taken and examined under a microscope. TREATMENT  Treatment depends on the type of rash you have. Your caregiver may prescribe certain medicines. For serious conditions, you may need to see a skin doctor (dermatologist). HOME CARE INSTRUCTIONS   Avoid the substance that caused your rash.  Do not scratch your rash. This can cause infection.  You may take cool baths to help stop itching.  Only take over-the-counter or prescription medicines as directed by your caregiver.  Keep all follow-up appointments as directed by your caregiver. SEEK IMMEDIATE MEDICAL CARE IF:  You have increasing pain, swelling, or redness.  You have a fever.  You have new or severe symptoms.  You have body aches, diarrhea, or vomiting.  Your rash is not better after 3 days. MAKE SURE YOU:  Understand these instructions.  Will watch your condition.  Will get help right away if you are not doing well or get worse. Document Released: 09/28/2002 Document Revised: 12/31/2011 Document Reviewed: 07/23/2011 Exit[employer] Patient Information 2014  [employer].  Eczema Atopic dermatitis, or eczema, is an inherited type of sensitive skin. Often people with eczema have a family history of allergies, asthma, or hay fever. It causes a red itchy rash and dry scaly skin. The itchiness may occur before the skin rash and may be very intense. It is not contagious. Eczema is generally worse during the cooler winter months and often improves with the warmth of summer. Eczema usually starts showing signs in infancy. Some children outgrow eczema, but it may last through adulthood. Flare-ups may be caused by:  Eating something or contact with something you are sensitive or allergic  to.  Stress. DIAGNOSIS  The diagnosis of eczema is usually based upon symptoms and medical history. TREATMENT  Eczema cannot be cured, but symptoms usually can be controlled with treatment or avoidance of allergens (things to which you are sensitive or allergic to).  Controlling the itching and scratching.  Use over-the-counter antihistamines as directed for itching. It is especially useful at night when the itching tends to be worse.  Use over-the-counter steroid creams as directed for itching.  Scratching makes the rash and itching worse and may cause impetigo (a skin infection) if fingernails are contaminated (dirty).  Keeping the skin well moisturized with creams every day. This will seal in moisture and help prevent dryness. Lotions containing alcohol and water can dry the skin and are not recommended.  Limiting exposure to allergens.  Recognizing situations that cause stress.  Developing a plan to manage stress. HOME CARE INSTRUCTIONS   Take prescription and over-the-counter medicines as directed by your caregiver.  Do not use anything on the skin without checking with your caregiver.  Keep baths or showers short (5 minutes) in warm (not hot) water. Use mild cleansers for bathing. You may add non-perfumed bath oil to the bath water. It is best to avoid soap and bubble bath.  Immediately after a bath or shower, when the skin is still damp, apply a moisturizing ointment to the entire body. This ointment should be a petroleum ointment. This will seal in moisture and help prevent dryness. The thicker the ointment the better. These should be unscented.  Keep fingernails cut short and wash hands often. If your child has eczema, it may be necessary to put soft gloves or mittens on your child at night.  Dress in clothes made of cotton or cotton blends. Dress lightly, as heat increases itching.  Avoid foods that may cause flare-ups. Common foods include cow's milk, peanut butter,  eggs and wheat.  Keep a child with eczema away from anyone with fever blisters. The virus that causes fever blisters (herpes simplex) can cause a serious skin infection in children with eczema. SEEK MEDICAL CARE IF:   Itching interferes with sleep.  The rash gets worse or is not better within one week following treatment.  The rash looks infected (pus or soft yellow scabs).  You or your child has an oral temperature above 102 F (38.9 C).  Your baby is older than 3 months with a rectal temperature of 100.5 F (38.1 C) or higher for more than 1 day.  The rash flares up after contact with someone who has fever blisters. SEEK IMMEDIATE MEDICAL CARE IF:   Your baby is older than 3 months with a rectal temperature of 102 F (38.9 C) or higher.  Your baby is older than 3 months or younger with a rectal temperature of 100.4 F (38 C) or higher. Document Released: 10/05/2000 Document Revised: 12/31/2011  Document Reviewed: 08/10/2009 [facility] Patient Information 2014 [place], [place].

## 2013-04-23 NOTE — Progress Notes (Signed)
Case discussed with Dr. McLean soon after the resident saw the patient.  We reviewed the resident's history and exam and pertinent patient test results.  I agree with the assessment, diagnosis, and plan of care documented in the resident's note. 

## 2013-05-14 DIAGNOSIS — R339 Retention of urine, unspecified: Secondary | ICD-10-CM | POA: Diagnosis not present

## 2013-05-14 DIAGNOSIS — R300 Dysuria: Secondary | ICD-10-CM | POA: Diagnosis not present

## 2013-05-14 DIAGNOSIS — N3020 Other chronic cystitis without hematuria: Secondary | ICD-10-CM | POA: Diagnosis not present

## 2013-06-19 DIAGNOSIS — N312 Flaccid neuropathic bladder, not elsewhere classified: Secondary | ICD-10-CM | POA: Diagnosis not present

## 2013-06-19 DIAGNOSIS — R82998 Other abnormal findings in urine: Secondary | ICD-10-CM | POA: Diagnosis not present

## 2013-07-15 DIAGNOSIS — N312 Flaccid neuropathic bladder, not elsewhere classified: Secondary | ICD-10-CM | POA: Diagnosis not present

## 2013-07-15 DIAGNOSIS — N3020 Other chronic cystitis without hematuria: Secondary | ICD-10-CM | POA: Diagnosis not present

## 2013-07-15 DIAGNOSIS — R300 Dysuria: Secondary | ICD-10-CM | POA: Diagnosis not present

## 2013-07-21 DIAGNOSIS — N3289 Other specified disorders of bladder: Secondary | ICD-10-CM | POA: Diagnosis not present

## 2013-07-21 DIAGNOSIS — N3020 Other chronic cystitis without hematuria: Secondary | ICD-10-CM | POA: Diagnosis not present

## 2013-07-31 DIAGNOSIS — N312 Flaccid neuropathic bladder, not elsewhere classified: Secondary | ICD-10-CM | POA: Diagnosis not present

## 2013-07-31 DIAGNOSIS — R109 Unspecified abdominal pain: Secondary | ICD-10-CM | POA: Diagnosis not present

## 2013-07-31 DIAGNOSIS — N3020 Other chronic cystitis without hematuria: Secondary | ICD-10-CM | POA: Diagnosis not present

## 2013-09-03 ENCOUNTER — Ambulatory Visit (INDEPENDENT_AMBULATORY_CARE_PROVIDER_SITE_OTHER): Payer: Medicare Other | Admitting: Internal Medicine

## 2013-09-03 ENCOUNTER — Encounter: Payer: Self-pay | Admitting: Internal Medicine

## 2013-09-03 VITALS — BP 131/85 | HR 66 | Temp 97.0°F | Ht 71.0 in | Wt 195.1 lb

## 2013-09-03 DIAGNOSIS — E785 Hyperlipidemia, unspecified: Secondary | ICD-10-CM

## 2013-09-03 DIAGNOSIS — I10 Essential (primary) hypertension: Secondary | ICD-10-CM

## 2013-09-03 DIAGNOSIS — Z23 Encounter for immunization: Secondary | ICD-10-CM | POA: Diagnosis not present

## 2013-09-03 LAB — BASIC METABOLIC PANEL WITH GFR
CO2: 31 mEq/L (ref 19–32)
Chloride: 100 mEq/L (ref 96–112)
GFR, Est African American: >89 mL/min
Glucose, Bld: 94 mg/dL (ref 70–99)
Potassium: 4.4 mEq/L (ref 3.5–5.3)
Sodium: 140 mEq/L (ref 135–145)

## 2013-09-03 MED ORDER — HYDROCHLOROTHIAZIDE 25 MG PO TABS: 25.0000 mg | ORAL_TABLET | Freq: Every day | ORAL | Status: DC

## 2013-09-03 MED ORDER — PRAVASTATIN SODIUM 20 MG PO TABS: 20.0000 mg | ORAL_TABLET | Freq: Every day | ORAL | Status: DC

## 2013-09-03 NOTE — Progress Notes (Signed)
Patient ID: Greg [name], male   DOB: [birth year], 72 y.o.   MRN: [id number]   Subjective:   Patient ID: Greg [name] male   DOB: 1941-10-06 72 y.o.   MRN: [id number]  HPI: [name] is a 72 y.o. PMH of HTN, sinus bradycardia, [name], HLD, bladder atony.  Presented today for medication refill and routine checkup. Please see assessment and plan for review or chronic medical condition.  Past Medical History  Diagnosis Date  . Hyperlipidemia   . Hypertension   . Epididymitis     right  . Atony of bladder     followed at [facility]; CIC 3x/day, has been treated for culture proven serratia marcesens 05/2011, 06/2011, 07/2011, 09/2011  . [name] (benign prostatic hyperplasia)     hyposensitive bladder, nodular prostate with increased PSA (10.23 in 03/2006), s/p prostate biopsy 2006 that was normal ([name]) he recommended TURP.  . Sinus bradycardia   . Chronic knee pain     left  . Gingivitis   . Right shoulder pain     sx in 06/2005  . Frozen shoulder     left frozen shoulder  . Shingles    Current Outpatient Prescriptions  Medication Sig Dispense Refill  . cephALEXin (KEFLEX) 500 MG capsule Take 1 capsule (500 mg total) by mouth 4 (four) times daily.  28 capsule  0  . gabapentin (NEURONTIN) 300 MG capsule Take 1 capsule (300 mg total) by mouth 2 (two) times daily.  60 capsule  3  . hydrochlorothiazide (HYDRODIURIL) 25 MG tablet Take 25 mg by mouth daily.      . pravastatin (PRAVACHOL) 20 MG tablet Take 1 tablet (20 mg total) by mouth at bedtime.  30 tablet  11   No current facility-administered medications for this visit.   No family history on file. History   Social History  . Marital Status: Single    Spouse Name: N/A    Number of Children: N/A  . Years of Education: 12   Occupational History  .      retired   Social History Main Topics  . Smoking status: Former Smoker    Types: Cigarettes    Quit date: 02/07/1988  . Smokeless tobacco: None  . Alcohol  Use: No  . Drug Use: No  . Sexual Activity: None   Other Topics Concern  . None   Social History Narrative  . None   Review of Systems: CONSTITUTIONAL- no Fever, weightloss, night sweat, no change in appetite. SKIN- no Rash, colour changes, itching. HEAD-NO Headache,or dizziness. EYES- no Vision loss, pain, redness, double or blurred vision. EARS- no vertigo, hearing loss, ear discharge. Mouth/throat- no Sorethroat. RESPIRATORY- Has some nasal congestion, and runny nose, No Cough,no SOB. CARDIAC- No alpitations, DOE, PND, or chest pain. GI- No Dysphagia, nausea, vomiting, diarrhoea, constipation, abd pain. URINARY- self cath.  Objective:  Physical Exam: Filed Vitals:   09/03/13 0929  BP: 131/85  Pulse: 66  Temp: 97 F (36.1 C)  TempSrc: Oral  Height: 5\' 11"  (1.803 m)  Weight: 195 lb 1.6 oz (88.497 kg)  SpO2: 99%   GENERAL- alert, co-operative, appears as stated age, not in any distress. HEENT- Atraumatic, normocephalic, PERRL, EOMI, oral mucosa appears moist.No cervical LN enlargement, thyroid does not appear enlarged. CARDIAC- RRR, no murmurs, rubs or gallops. RESP- Moving equal volumes of air, and clear to auscultation bilaterally. ABDOMEN- Soft,non tender, no palpable masses or organomegaly, bowel sounds present. BACK- Normal curvature of the spine, No tenderness along  the vertebrae, no CVA tenderness. NEURO- No obvious Cr N abnormality, strenght equal and present in all extremities EXTREMITIES- pulse 2+, symmetric. SKIN- Warm, dry, No rash or lesion. PSYCH- Normal mood and affect, appropriate thought content and speech.  Assessment & Plan:   The patient's case and plan of care was discussed with attending physician, Dr. Doreene Adas.  Please see problem based chatting for assessment and plan.

## 2013-09-03 NOTE — Assessment & Plan Note (Signed)
Well controlled.  Vitals - 1 value per visit 09/03/2013 04/22/2013 03/18/2013  SYSTOLIC 131 132 161  DIASTOLIC 85 83 92  Pulse 66 53 50   Last BMP- done 08/29/2013-Na- 141  K- 4.1 Cr- 1.0.  Assessment:  Hypertension control: controlled  Progress toward goals: at goal  Barriers to meeting goals: no barriers identified  Plan- Refills called in. - BMP check today. - Continue HCT at  25mg  dly.

## 2013-09-03 NOTE — Patient Instructions (Signed)
Please complete your medications prescribed by your Urologist. Also if the nasal congestion and runny noise is unbearable or gets worse, you can pick up Benadryl over the counter, take 25mg  about before you sleep at night. We will call in refills of your medication.

## 2013-09-08 DIAGNOSIS — R300 Dysuria: Secondary | ICD-10-CM | POA: Diagnosis not present

## 2013-09-08 DIAGNOSIS — N3020 Other chronic cystitis without hematuria: Secondary | ICD-10-CM | POA: Diagnosis not present

## 2013-09-21 ENCOUNTER — Other Ambulatory Visit: Payer: Self-pay | Admitting: Internal Medicine

## 2013-11-23 ENCOUNTER — Other Ambulatory Visit: Payer: Self-pay | Admitting: Internal Medicine

## 2013-12-28 DIAGNOSIS — R339 Retention of urine, unspecified: Secondary | ICD-10-CM | POA: Diagnosis not present

## 2013-12-28 DIAGNOSIS — N3020 Other chronic cystitis without hematuria: Secondary | ICD-10-CM | POA: Diagnosis not present

## 2013-12-28 DIAGNOSIS — N312 Flaccid neuropathic bladder, not elsewhere classified: Secondary | ICD-10-CM | POA: Diagnosis not present

## 2014-02-12 DIAGNOSIS — N3020 Other chronic cystitis without hematuria: Secondary | ICD-10-CM | POA: Diagnosis not present

## 2014-02-12 DIAGNOSIS — R300 Dysuria: Secondary | ICD-10-CM | POA: Diagnosis not present

## 2014-03-11 ENCOUNTER — Encounter: Payer: Self-pay | Admitting: Internal Medicine

## 2014-03-11 ENCOUNTER — Ambulatory Visit (INDEPENDENT_AMBULATORY_CARE_PROVIDER_SITE_OTHER): Payer: Medicare Other | Admitting: Internal Medicine

## 2014-03-11 VITALS — BP 139/78 | HR 53 | Temp 97.8°F | Ht 71.0 in | Wt 199.2 lb

## 2014-03-11 DIAGNOSIS — E785 Hyperlipidemia, unspecified: Secondary | ICD-10-CM | POA: Diagnosis not present

## 2014-03-11 DIAGNOSIS — I10 Essential (primary) hypertension: Secondary | ICD-10-CM | POA: Diagnosis not present

## 2014-03-11 DIAGNOSIS — M5412 Radiculopathy, cervical region: Secondary | ICD-10-CM

## 2014-03-11 DIAGNOSIS — J329 Chronic sinusitis, unspecified: Secondary | ICD-10-CM | POA: Diagnosis not present

## 2014-03-11 DIAGNOSIS — K921 Melena: Secondary | ICD-10-CM | POA: Diagnosis not present

## 2014-03-11 DIAGNOSIS — J309 Allergic rhinitis, unspecified: Secondary | ICD-10-CM | POA: Insufficient documentation

## 2014-03-11 LAB — CBC
HCT: 41.1 % (ref 39.0–52.0)
Hemoglobin: 13.8 g/dL (ref 13.0–17.0)
MCH: 30.7 pg (ref 26.0–34.0)
MCHC: 33.6 g/dL (ref 30.0–36.0)
MCV: 91.5 fL (ref 78.0–100.0)
PLATELETS: 305 10*3/uL (ref 150–400)
RBC: 4.49 MIL/uL (ref 4.22–5.81)
RDW: 14.2 % (ref 11.5–15.5)
WBC: 6.3 10*3/uL (ref 4.0–10.5)

## 2014-03-11 MED ORDER — FLUTICASONE PROPIONATE 50 MCG/ACT NA SUSP: 2.0000 | Freq: Every day | NASAL | Status: DC

## 2014-03-11 NOTE — Assessment & Plan Note (Signed)
Offered colonoscopy - he has to arrange transportation, and will get back to [place].  CBC today.

## 2014-03-11 NOTE — Assessment & Plan Note (Signed)
Start Flonase.  May add claritin if needed.

## 2014-03-11 NOTE — Assessment & Plan Note (Signed)
No longer symptomatic, he has stopped gabapentin.

## 2014-03-11 NOTE — Addendum Note (Signed)
Addended by: Othella Boyer on: 03/11/2014 05:20 PM   Modules accepted: Orders

## 2014-03-11 NOTE — Assessment & Plan Note (Signed)
On pravastatin 20mg  qHS - compliant.  LDL goal <130.  Check lipids today.

## 2014-03-11 NOTE — Patient Instructions (Signed)
General Instructions: - Your blood pressure looks great! Keep it up!!  -Today I am checking your kidney function and your cholesterol, as well as your red blood cell level  -Please let us know when you are ready to schedule your colonoscopy  -Regarding your runny nose, start taking flonase, 2 sprays in each nare daily for 1 week, then decrease to 1 spray in each nare daily.  If you still have symptoms, let us know and we will add Loratadine 10mg  daily.  Please try to bring all your medicines next time. This will help [place] keep you safe from mistakes.  Should you have any new or worsening symptoms, please be sure to call the clinic at [phone].   Progress Toward Treatment Goals:  Treatment Goal 03/11/2014  Blood pressure at goal    Self Care Goals & Plans:  Self Care Goal 03/11/2014  Manage my medications take my medicines as prescribed; refill my medications on time; bring my medications to every visit  Eat healthy foods eat baked foods instead of fried foods; eat foods that are low in salt  Be physically active find an activity I enjoy    No flowsheet data found.   Care Management & Community Referrals:  Referral 03/11/2014  Referrals made for care management support none needed

## 2014-03-11 NOTE — Assessment & Plan Note (Signed)
BP Readings from Last 3 Encounters:  03/11/14 139/78  09/03/13 131/85  04/22/13 132/83    Lab Results  Component Value Date   NA 140 09/03/2013   K 4.4 09/03/2013   CREATININE 0.95 09/03/2013    Assessment: Blood pressure control: controlled Progress toward BP goal:  at goal  Plan: Medications:  continue current medications - hctz 25 daily Educational resources provided: brochure Self management tools provided: home blood pressure logbook Other plans: CMET today

## 2014-03-11 NOTE — Progress Notes (Signed)
Subjective:   Patient ID: Greg [name] male   DOB: 1941-05-09 73 y.o.   MRN: [id number]  Chief Complaint  Patient presents with  . Nasal Congestion    c/o "runny nose" off and on for about 2wks   HPI: [name] is a 73 y.o. man with h/o HTN, HLD, cervical spinal stenosis (C3-6, MRI 05/22/12), bladder atony (with frequent UTIs, followed by Urology), and [name] who presents for routine.    Last seen in clinic on 09/03/13 for routine and med refills.    Rhinorrhea with watery eyes sometimes, not often, sometimes sneezes.  Feels puffy around eyes. No fevers. Feels congested.  No cough.   Due for colonoscopy.    Review of Systems: Constitutional: Denies fever, chills, diaphoresis, appetite change and fatigue.  HEENT: Denies photophobia, hearing loss, ear pain, sore throat,  mouth sores, trouble swallowing, neck pain, neck stiffness and tinnitus.  Respiratory: Denies SOB, DOE, chest tightness, and wheezing.  Cardiovascular: Denies chest pain, palpitations and leg swelling.  Gastrointestinal: Denies nausea, vomiting, abdominal pain, diarrhea, constipation,blood in stool and abdominal distention.  Genitourinary: followed by urology regularly, no symptoms currently, goes back next month - cath 2-3x/day Musculoskeletal: left knee pain sometimes, shakes it off, soak in water or ice down, doesn't need medications, better with rest Skin: Denies pallor, rash and wound.  Neurological: Denies dizziness, seizures, syncope, weakness, lightheadedness, numbness and headaches.  Hematological: occ blood with frequent cath Psychiatric/Behavioral: sleeping well, good mood, good appetite   Past Medical History  Diagnosis Date  . Hyperlipidemia   . Hypertension   . Epididymitis     right  . Atony of bladder     followed at [facility]; CIC 3x/day, has been treated for culture proven serratia marcesens 05/2011, 06/2011, 07/2011, 09/2011  . [name] (benign prostatic hyperplasia)    hyposensitive bladder, nodular prostate with increased PSA (10.23 in 03/2006), s/p prostate biopsy 2006 that was normal (Dr Greg [name]) he recommended TURP.  . Sinus bradycardia   . Chronic knee pain     left  . Gingivitis   . Right shoulder pain     sx in 06/2005  . Frozen shoulder     left frozen shoulder  . Shingles    Current Outpatient Prescriptions  Medication Sig Dispense Refill  . cephALEXin (KEFLEX) 500 MG capsule Take 1 capsule (500 mg total) by mouth 4 (four) times daily.  28 capsule  0  . gabapentin (NEURONTIN) 300 MG capsule Take 1 capsule (300 mg total) by mouth 2 (two) times daily.  60 capsule  3  . hydrochlorothiazide (HYDRODIURIL) 25 MG tablet TAKE ONE TABLET BY MOUTH ONE TIME DAILY  31 tablet  11  . pravastatin (PRAVACHOL) 20 MG tablet TAKE ONE TABLET BY MOUTH AT BEDTIME  30 tablet  11   No current facility-administered medications for this visit.   No family history on file. History   Social History  . Marital Status: Single    Spouse Name: N/A    Number of Children: N/A  . Years of Education: 12   Occupational History  .      retired   Social History Main Topics  . Smoking status: Former Smoker    Types: Cigarettes    Quit date: 02/07/1988  . Smokeless tobacco: Not on file  . Alcohol Use: No  . Drug Use: No  . Sexual Activity: Not on file   Other Topics Concern  . Not on file   Social History Narrative  .  No narrative on file    Objective:  Physical Exam: Filed Vitals:   03/11/14 1354  BP: 139/78  Pulse: 53  Temp: 97.8 F (36.6 C)  TempSrc: Oral  Height: 5\' 11"  (1.803 m)  Weight: 199 lb 3.2 oz (90.357 kg)  SpO2: 97%   General: pleasant, appears as stated age HEENT: PERRL, EOMI, no scleral icterus Cardiac: RRR, no rubs, murmurs or gallops Pulm: clear to auscultation bilaterally, moving normal volumes of air Abd: soft, nontender, nondistended, BS present Ext: warm and well perfused, 1+ b/l pretibial pitting edema Neuro: alert and  oriented X3, cranial nerves II-XII grossly intact  Assessment & Plan:  Case and care discussed with Dr. Dareen Piano.  Please see problem oriented charting for further details. Patient to return in 6 months for routine.

## 2014-03-12 LAB — COMPREHENSIVE METABOLIC PANEL
ALT: <8 U/L (ref 0–53)
AST: 12 U/L (ref 0–37)
Albumin: 3.8 g/dL (ref 3.5–5.2)
Alkaline Phosphatase: 63 U/L (ref 39–117)
BUN: 21 mg/dL (ref 6–23)
CALCIUM: 9 mg/dL (ref 8.4–10.5)
CHLORIDE: 103 meq/L (ref 96–112)
CO2: 32 mEq/L (ref 19–32)
CREATININE: 0.97 mg/dL (ref 0.50–1.35)
GLUCOSE: 78 mg/dL (ref 70–99)
Potassium: 4.8 mEq/L (ref 3.5–5.3)
Sodium: 142 mEq/L (ref 135–145)
Total Bilirubin: 0.6 mg/dL (ref 0.2–1.2)
Total Protein: 7 g/dL (ref 6.0–8.3)

## 2014-03-12 LAB — LIPID PANEL
CHOL/HDL RATIO: 2.3 ratio
CHOLESTEROL: 129 mg/dL (ref 0–200)
HDL: 55 mg/dL (ref >39)
LDL Cholesterol: 67 mg/dL (ref 0–99)
TRIGLYCERIDES: 37 mg/dL (ref <150)
VLDL: 7 mg/dL (ref 0–40)

## 2014-03-15 NOTE — Progress Notes (Signed)
INTERNAL MEDICINE TEACHING ATTENDING ADDENDUM - Ralston Venus, MD: I reviewed and discussed with the resident Dr. Sharda, the patient's medical history, physical examination, diagnosis and results of pertinent tests and treatment and I agree with the patient's care as documented. 

## 2014-04-05 DIAGNOSIS — R339 Retention of urine, unspecified: Secondary | ICD-10-CM | POA: Diagnosis not present

## 2014-04-05 DIAGNOSIS — N3020 Other chronic cystitis without hematuria: Secondary | ICD-10-CM | POA: Diagnosis not present

## 2014-05-18 ENCOUNTER — Emergency Department (HOSPITAL_COMMUNITY)
Admission: EM | Admit: 2014-05-18 | Discharge: 2014-05-18 | Disposition: A | Discharge status: Home / Self Care | Payer: Medicare Other | Attending: Emergency Medicine | Admitting: Emergency Medicine

## 2014-05-18 ENCOUNTER — Encounter (HOSPITAL_COMMUNITY): Payer: Self-pay | Admitting: Emergency Medicine

## 2014-05-18 DIAGNOSIS — Z8619 Personal history of other infectious and parasitic diseases: Secondary | ICD-10-CM | POA: Insufficient documentation

## 2014-05-18 DIAGNOSIS — Z87448 Personal history of other diseases of urinary system: Secondary | ICD-10-CM | POA: Diagnosis not present

## 2014-05-18 DIAGNOSIS — Z79899 Other long term (current) drug therapy: Secondary | ICD-10-CM | POA: Insufficient documentation

## 2014-05-18 DIAGNOSIS — Z8739 Personal history of other diseases of the musculoskeletal system and connective tissue: Secondary | ICD-10-CM | POA: Insufficient documentation

## 2014-05-18 DIAGNOSIS — I10 Essential (primary) hypertension: Secondary | ICD-10-CM | POA: Diagnosis not present

## 2014-05-18 DIAGNOSIS — Z87891 Personal history of nicotine dependence: Secondary | ICD-10-CM | POA: Diagnosis not present

## 2014-05-18 DIAGNOSIS — G8929 Other chronic pain: Secondary | ICD-10-CM | POA: Diagnosis not present

## 2014-05-18 DIAGNOSIS — K644 Residual hemorrhoidal skin tags: Secondary | ICD-10-CM | POA: Diagnosis not present

## 2014-05-18 DIAGNOSIS — E785 Hyperlipidemia, unspecified: Secondary | ICD-10-CM | POA: Diagnosis not present

## 2014-05-18 DIAGNOSIS — Z792 Long term (current) use of antibiotics: Secondary | ICD-10-CM | POA: Insufficient documentation

## 2014-05-18 DIAGNOSIS — K5900 Constipation, unspecified: Secondary | ICD-10-CM | POA: Diagnosis not present

## 2014-05-18 LAB — POC OCCULT BLOOD, ED: Fecal Occult Bld: NEGATIVE

## 2014-05-18 MED ORDER — HYDROCORTISONE ACETATE 25 MG RE SUPP: 25.0000 mg | Freq: Two times a day (BID) | RECTAL | Status: DC

## 2014-05-18 MED ORDER — POLYETHYLENE GLYCOL 3350 17 G PO PACK: 17.0000 g | PACK | Freq: Every day | ORAL | Status: DC

## 2014-05-18 NOTE — ED Provider Notes (Signed)
Medical screening examination/treatment/procedure(s) were performed by non-physician practitioner and as supervising physician I was immediately available for consultation/collaboration.   EKG Interpretation None      Rolland Porter, MD, Abram Sander   Janice Norrie, MD 05/18/14 [phone]

## 2014-05-18 NOTE — Discharge Instructions (Signed)
Your hemorrhoid is flared up, which can be painful and can cause some blood on the toilet paper when you wipe. You have been given anusol to help with this, use it as directed. Constipation and straining can be the cause of hemorrhoids, so you will need to use miralax daily until you have soft stools, and then you may stop using it. Increase the fiber and water in your diet. See your regular doctor in 1 week for a follow up. Return to the emergency department for any changes or worsening of symptoms.    Constipation Constipation is when a person:  Poops (has a bowel movement) less than 3 times a week.  Has a hard time pooping.  Has poop that is dry, hard, or bigger than normal. HOME CARE   Eat foods with a lot of fiber in them. This includes fruits, vegetables, beans, and whole grains such as brown rice.  Avoid fatty foods and foods with a lot of sugar. This includes french fries, hamburgers, cookies, candy, and soda.  If you are not getting enough fiber from food, take products with added fiber in them (supplements).  Drink enough fluid to keep your pee (urine) clear or pale yellow.  Exercise on a regular basis, or as told by your doctor.  Go to the restroom when you feel like you need to poop. Do not hold it.  Only take medicine as told by your doctor. Do not take medicines that help you poop (laxatives) without talking to your doctor first. GET HELP RIGHT AWAY IF:   You have bright red blood in your poop (stool).  Your constipation lasts more than 4 days or gets worse.  You have belly (abdominal) or butt (rectal) pain.  You have thin poop (as thin as a pencil).  You lose weight, and it cannot be explained. MAKE SURE YOU:   Understand these instructions.  Will watch your condition.  Will get help right away if you are not doing well or get worse. Document Released: 03/26/2008 Document Revised: 10/13/2013 Document Reviewed: 07/20/2013 [facility] Patient Information 2015  [place], [place]. This information is not intended to replace advice given to you by your health care provider. Make sure you discuss any questions you have with your health care provider.  Hemorrhoids Hemorrhoids are puffy (swollen) veins around the rectum or anus. Hemorrhoids can cause pain, itching, bleeding, or irritation. HOME CARE  Eat foods with fiber, such as whole grains, beans, nuts, fruits, and vegetables. Ask your doctor about taking products with added fiber in them (fibersupplements).  Drink enough fluid to keep your pee (urine) clear or pale yellow.  Exercise often.  Go to the bathroom when you have the urge to poop. Do not wait.  Avoid straining to poop (bowel movement).  Keep the butt area dry and clean. Use wet toilet paper or moist paper towels.  Medicated creams and medicine inserted into the anus (anal suppository) may be used or applied as told.  Only take medicine as told by your doctor.  Take a warm water bath (sitz bath) for 15-20 minutes to ease pain. Do this 3-4 times a day.  Place ice packs on the area if it is tender or puffy. Use the ice packs between the warm water baths.  Put ice in a plastic bag.  Place a towel between your skin and the bag.  Leave the ice on for 15-20 minutes, 03-04 times a day.  Do not use a donut-shaped pillow or sit on the  toilet for a long time. GET HELP RIGHT AWAY IF:   You have more pain that is not controlled by treatment or medicine.  You have bleeding that will not stop.  You have trouble or are unable to poop (bowel movement).  You have pain or puffiness outside the area of the hemorrhoids. MAKE SURE YOU:   Understand these instructions.  Will watch your condition.  Will get help right away if you are not doing well or get worse. Document Released: 07/17/2008 Document Revised: 09/24/2012 Document Reviewed: 08/19/2012 [facility] Patient Information 2015 [place], [place]. This information is not intended to  replace advice given to you by your health care provider. Make sure you discuss any questions you have with your health care provider.

## 2014-05-18 NOTE — ED Notes (Signed)
Per pt sts boil to inner thigh/rectum area. Denies drainage.

## 2014-05-18 NOTE — ED Provider Notes (Signed)
CSN: [id number]     Arrival date & time 05/18/14  1251 History   First MD Initiated Contact with Patient 05/18/14 1615     Chief Complaint  Patient presents with  . Abscess     (Consider location/radiation/quality/duration/timing/severity/associated sxs/prior Treatment) HPI Comments: Greg [name] is a 73 y.o. Male with a PMHx of HTN, HLD, cervical spine stenosis, shingles, and [name] presenting for a mass near his rectum that started last night, as well as intermittent constipation and hard stools that are occasionally tinged with a streak of blood. Pt states that when he's constipated, he feels some LLQ pain during his BM, but is not having any abd pain now. Pt states he noticed this rectal mass last night, stated it was the size of a pin head, was unable to assess for warmth or redness, but states it felt swollen. States it hurts when he has a BM. Denies fevers, chills, CP, SOB, N/V/D, current abd pain, myalgias, arthralgias, melena, paresthesias, or rashes. States he's unsure what causes his constipation, and it is very intermittent. Still passing gas. Denies penile pain, discharge, swelling, testicular pain, swelling or masses. Denies feeling hernias in his groin with bowel movements.  Patient is a 73 y.o. male presenting with constipation. The history is provided by the patient. No language interpreter was used.  Constipation Severity:  Mild Timing:  Intermittent Progression:  Unchanged Chronicity:  Chronic Context: not dehydration, not dietary changes and not medication   Stool description:  Hard and formed Unusual stool frequency:  Intermittent Relieved by:  None tried Worsened by:  Nothing tried Ineffective treatments:  None tried Associated symptoms: abdominal pain (LLQ, intermittent, only when straining) and hematochezia (intermittent, after straining)   Associated symptoms: no back pain, no diarrhea, no dysuria, no fever, no flatus, no nausea, no urinary retention and no vomiting      Past Medical History  Diagnosis Date  . Hyperlipidemia   . Hypertension   . Epididymitis     right  . Atony of bladder     followed at [facility]; CIC 3x/day, has been treated for culture proven serratia marcesens 05/2011, 06/2011, 07/2011, 09/2011  . [name] (benign prostatic hyperplasia)     hyposensitive bladder, nodular prostate with increased PSA (10.23 in 03/2006), s/p prostate biopsy 2006 that was normal (Dr Jeffie Pollock) he recommended TURP.  . Sinus bradycardia   . Chronic knee pain     left  . Gingivitis   . Right shoulder pain     sx in 06/2005  . Frozen shoulder     left frozen shoulder  . Shingles    Past Surgical History  Procedure Laterality Date  . Vasectomy    . Hydrocelectomy  11/14/2010    left hydrocelectomy  . Right shoulder arthroscopic surgery  06/2005  . Transurethral resection of prostate      status post TURP 2/2 [name] in 2008  . Knee surgery      L knee   History reviewed. No pertinent family history. History  Substance Use Topics  . Smoking status: Former Smoker    Types: Cigarettes    Quit date: 02/07/1988  . Smokeless tobacco: Not on file  . Alcohol Use: No    Review of Systems  Constitutional: Negative for fever and chills.  Respiratory: Negative for chest tightness and shortness of breath.   Cardiovascular: Negative for chest pain.  Gastrointestinal: Positive for abdominal pain (LLQ, intermittent, only when straining), constipation (intermittent), blood in stool (intermittent), hematochezia (intermittent, after straining)  and rectal pain (with BM). Negative for nausea, vomiting, diarrhea, abdominal distention, anal bleeding and flatus.  Genitourinary: Negative for dysuria, urgency, frequency, hematuria, flank pain, decreased urine volume, discharge, penile swelling, scrotal swelling, penile pain and testicular pain.  Musculoskeletal: Negative for arthralgias, back pain, joint swelling and myalgias.  Skin: Negative for rash.  Neurological:  Negative for dizziness, weakness and headaches.  Psychiatric/Behavioral: Negative for confusion.  10 Systems reviewed and are negative for acute change except as noted in the HPI.      Allergies  Ciprofloxacin hcl and Ibuprofen  Home Medications   Prior to Admission medications   Medication Sig Start Date End Date Taking? Authorizing Provider  cephALEXin (KEFLEX) 250 MG capsule Take 250 mg by mouth at bedtime. Started 04/21/14.  30 day supply. 04/21/14  Yes Historical Provider, MD  hydrochlorothiazide (HYDRODIURIL) 25 MG tablet Take 25 mg by mouth daily.   Yes Historical Provider, MD  pravastatin (PRAVACHOL) 20 MG tablet Take 20 mg by mouth daily.   Yes Historical Provider, MD  hydrocortisone (ANUSOL-HC) 25 MG suppository Place 1 suppository (25 mg total) rectally 2 (two) times daily. For 7 days 05/18/14   Patty Sermons Camprubi-Soms, PA-C  polyethylene glycol (MIRALAX / GLYCOLAX) packet Take 17 g by mouth daily. 05/18/14   Navi Ewton Strupp Camprubi-Soms, PA-C   BP 152/86  Pulse 54  Temp(Src) 98.1 F (36.7 C) (Oral)  Resp 16  SpO2 98% Physical Exam  Nursing note and vitals reviewed. Constitutional: He is oriented to person, place, and time. Vital signs are normal. He appears well-developed and well-nourished. No distress.  VSS, afebrile, NAD  HENT:  Head: Normocephalic and atraumatic.  Mouth/Throat: Mucous membranes are normal.  Eyes: Conjunctivae and EOM are normal. Right eye exhibits no discharge. Left eye exhibits no discharge.  Arcus senilis noted bilaterally  Neck: Normal range of motion. Neck supple.  Cardiovascular: Normal rate, regular rhythm, normal heart sounds and intact distal pulses.   No murmur heard. Pulmonary/Chest: Effort normal and breath sounds normal. No respiratory distress. He has no decreased breath sounds. He has no wheezes. He has no rales.  Abdominal: Soft. Normal appearance and bowel sounds are normal. He exhibits no distension. There is no tenderness.  There is no rigidity, no rebound, no guarding, no CVA tenderness and no tenderness at McBurney's point. No hernia. Hernia confirmed negative in the right inguinal area and confirmed negative in the left inguinal area.  Soft, NT/ND, +BS throughout. No r/g/r, neg mcburney's point TTP, no CVA TTP. No hernias noted with valsalva  Genitourinary: Testes normal and penis normal. Rectal exam shows external hemorrhoid. Rectal exam shows no internal hemorrhoid, no fissure, no mass and no tenderness. Prostate is enlarged. Prostate is not tender. Right testis shows no swelling and no tenderness. Left testis shows no swelling and no tenderness. Uncircumcised. No penile erythema or penile tenderness. No discharge found.  Penis uncircumscised with no TTP, erythema, or discharge, scrotum with no TTP or swelling. No inguinal hernias with coughing. Large approx 3cm ext hemorrhoid at approx 4'o'clock position, easily reducible, moderately TTP, without erythema or strangulation. No fecal mass within rectum, no anal fissure noted, no internal hemorrhoids palpated, no rectal tenderness or mass. Prostate diffusely enlarged without warmth, TTP, or fluctuance.   Musculoskeletal: Normal range of motion.  Neurological: He is alert and oriented to person, place, and time.  Skin: Skin is warm, dry and intact. No rash noted. No erythema.  Psychiatric: He has a normal mood and affect.    ED  Course  Procedures (including critical care time) Labs Review Labs Reviewed  OCCULT BLOOD X 1 CARD TO LAB, STOOL  POC OCCULT BLOOD, ED    Imaging Review No results found.   EKG Interpretation None      MDM   Final diagnoses:  External hemorrhoid  Constipation, unspecified constipation type    Jousha Schwandt is a 73 y.o. male with a PMHx of HTN, HLD, cervical spine stenosis, shingles, and [name] presenting for a mass near his rectum and intermittent constipation. States he noticed the mass last night, and is tender. Rectal exam  notable for external hemorrhoid, easily reducible. FOBT card obtained and sent. No rectal masses or prostate tenderness, although it is enlarged consistent with his [name]. Will tx with anusol suppositories and miralax for constipation, and have pt f/up with PCP.  5:18 PM FOBT negative. Will have pt f/up with PCP in 1 wk, rx for anusol and miralax given. Discussed increased fiber and water in diet. I explained the diagnosis and have given explicit precautions to return to the ER including for any other new or worsening symptoms. The patient understands and accepts the medical plan as it's been dictated and I have answered their questions. Discharge instructions concerning home care and prescriptions have been given. The patient is STABLE and is discharged to home in good condition.  BP 152/86  Pulse 54  Temp(Src) 98.1 F (36.7 C) (Oral)  Resp 16  SpO2 98%      Patty Sermons Camprubi-Soms, PA-C 05/18/14 1724

## 2014-05-19 NOTE — Progress Notes (Signed)
This CM received a phone call from the patient stating the his insurance does not cover the ED prescriptions. This CM then contacted the [facility] on [address] and the pharmacist stated that the medication are not covered by Medicare part D. She was able to change the Miralax from packets to the bottle with the same directions. This CM then received a new order for preparation H to replace the Anusol which was not covered. Dr. Mingo Amber stated to have the patient follow the directions on the package. This CM communicated the new order to the pharmacist and then called the patient and left a message on his phone with the new orders that had been called in and are covered. No other questions or concerns at this time. Edwyna Shell, RN, BSN, Case Manager [phone] 05/19/2014 2:09 PM

## 2014-06-01 DIAGNOSIS — R300 Dysuria: Secondary | ICD-10-CM | POA: Diagnosis not present

## 2014-06-07 ENCOUNTER — Encounter: Payer: Self-pay | Admitting: Internal Medicine

## 2014-06-07 ENCOUNTER — Ambulatory Visit (INDEPENDENT_AMBULATORY_CARE_PROVIDER_SITE_OTHER): Payer: Medicare Other | Admitting: Internal Medicine

## 2014-06-07 VITALS — BP 138/83 | HR 52 | Temp 97.1°F | Wt 189.1 lb

## 2014-06-07 DIAGNOSIS — I10 Essential (primary) hypertension: Secondary | ICD-10-CM | POA: Diagnosis not present

## 2014-06-07 DIAGNOSIS — I495 Sick sinus syndrome: Secondary | ICD-10-CM

## 2014-06-07 DIAGNOSIS — Z0000 Encounter for general adult medical examination without abnormal findings: Secondary | ICD-10-CM

## 2014-06-07 DIAGNOSIS — J309 Allergic rhinitis, unspecified: Secondary | ICD-10-CM

## 2014-06-07 DIAGNOSIS — K644 Residual hemorrhoidal skin tags: Secondary | ICD-10-CM | POA: Diagnosis not present

## 2014-06-07 DIAGNOSIS — I498 Other specified cardiac arrhythmias: Secondary | ICD-10-CM | POA: Diagnosis not present

## 2014-06-07 DIAGNOSIS — K649 Unspecified hemorrhoids: Secondary | ICD-10-CM | POA: Diagnosis not present

## 2014-06-07 LAB — CBC
HEMATOCRIT: 43.3 % (ref 39.0–52.0)
HEMOGLOBIN: 14.6 g/dL (ref 13.0–17.0)
MCH: 30.5 pg (ref 26.0–34.0)
MCHC: 33.7 g/dL (ref 30.0–36.0)
MCV: 90.6 fL (ref 78.0–100.0)
Platelets: 320 10*3/uL (ref 150–400)
RBC: 4.78 MIL/uL (ref 4.22–5.81)
RDW: 14 % (ref 11.5–15.5)
WBC: 6.1 10*3/uL (ref 4.0–10.5)

## 2014-06-07 LAB — T4, FREE: Free T4: 0.93 ng/dL (ref 0.80–1.80)

## 2014-06-07 LAB — TSH: TSH: 1.513 u[IU]/mL (ref 0.350–4.500)

## 2014-06-07 MED ORDER — CETIRIZINE HCL 10 MG PO TABS: 10.0000 mg | ORAL_TABLET | Freq: Every day | ORAL | Status: DC

## 2014-06-07 MED ORDER — PHENYLEPH-SHARK LIV OIL-MO-PET 0.25-3-14-71.9 % RE OINT: 1.0000 "application " | TOPICAL_OINTMENT | Freq: Two times a day (BID) | RECTAL | Status: DC | PRN

## 2014-06-07 NOTE — Assessment & Plan Note (Addendum)
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   Plan:  -Pt declines intranasal corticosteroid therapy -Prescribe cetirizine 10 mg daily  -Consider referral to allergy and immunology for skin testing

## 2014-06-07 NOTE — Patient Instructions (Signed)
-Will check your blood work today -Will refer you for colonoscopy -Keep using Prep H, Sitz baths, and start using miralax -Increase your fiber and fluid intake -Take zyrtec 10 mg daily for allergies -Nice meeting you!   Hemorrhoids Hemorrhoids are swollen veins around the rectum or anus. There are two types of hemorrhoids:   Internal hemorrhoids. These occur in the veins just inside the rectum. They may poke through to the outside and become irritated and painful.  External hemorrhoids. These occur in the veins outside the anus and can be felt as a painful swelling or hard lump near the anus. CAUSES  Pregnancy.   Obesity.   Constipation or diarrhea.   Straining to have a bowel movement.   Sitting for long periods on the toilet.  Heavy lifting or other activity that caused you to strain.  Anal intercourse. SYMPTOMS   Pain.   Anal itching or irritation.   Rectal bleeding.   Fecal leakage.   Anal swelling.   One or more lumps around the anus.  DIAGNOSIS  Your caregiver may be able to diagnose hemorrhoids by visual examination. Other examinations or tests that may be performed include:   Examination of the rectal area with a gloved hand (digital rectal exam).   Examination of anal canal using a small tube (scope).   A blood test if you have lost a significant amount of blood.  A test to look inside the colon (sigmoidoscopy or colonoscopy). TREATMENT Most hemorrhoids can be treated at home. However, if symptoms do not seem to be getting better or if you have a lot of rectal bleeding, your caregiver may perform a procedure to help make the hemorrhoids get smaller or remove them completely. Possible treatments include:   Placing a rubber band at the base of the hemorrhoid to cut off the circulation (rubber band ligation).   Injecting a chemical to shrink the hemorrhoid (sclerotherapy).   Using a tool to burn the hemorrhoid (infrared light therapy).    Surgically removing the hemorrhoid (hemorrhoidectomy).   Stapling the hemorrhoid to block blood flow to the tissue (hemorrhoid stapling).  HOME CARE INSTRUCTIONS   Eat foods with fiber, such as whole grains, beans, nuts, fruits, and vegetables. Ask your doctor about taking products with added fiber in them (fibersupplements).  Increase fluid intake. Drink enough water and fluids to keep your urine clear or pale yellow.   Exercise regularly.   Go to the bathroom when you have the urge to have a bowel movement. Do not wait.   Avoid straining to have bowel movements.   Keep the anal area dry and clean. Use wet toilet paper or moist towelettes after a bowel movement.   Medicated creams and suppositories may be used or applied as directed.   Only take over-the-counter or prescription medicines as directed by your caregiver.   Take warm sitz baths for 15-20 minutes, 3-4 times a day to ease pain and discomfort.   Place ice packs on the hemorrhoids if they are tender and swollen. Using ice packs between sitz baths may be helpful.   Put ice in a plastic bag.   Place a towel between your skin and the bag.   Leave the ice on for 15-20 minutes, 3-4 times a day.   Do not use a donut-shaped pillow or sit on the toilet for long periods. This increases blood pooling and pain.  SEEK MEDICAL CARE IF:  You have increasing pain and swelling that is not controlled by treatment  or medicine.  You have uncontrolled bleeding.  You have difficulty or you are unable to have a bowel movement.  You have pain or inflammation outside the area of the hemorrhoids. MAKE SURE YOU:  Understand these instructions.  Will watch your condition.  Will get help right away if you are not doing well or get worse. Document Released: 10/05/2000 Document Revised: 09/24/2012 Document Reviewed: 08/12/2012 [facility] Patient Information 2015 [place], [place]. This information is not intended to  replace advice given to you by your health care provider. Make sure you discuss any questions you have with your health care provider.   General Instructions:   Please bring your medicines with you each time you come to clinic.  Medicines may include prescription medications, over-the-counter medications, herbal remedies, eye drops, vitamins, or other pills.   Progress Toward Treatment Goals:  Treatment Goal 03/11/2014  Blood pressure at goal    Self Care Goals & Plans:  Self Care Goal 03/11/2014  Manage my medications take my medicines as prescribed; refill my medications on time; bring my medications to every visit  Eat healthy foods eat baked foods instead of fried foods; eat foods that are low in salt  Be physically active find an activity I enjoy    No flowsheet data found.   Care Management & Community Referrals:  Referral 03/11/2014  Referrals made for care management support none needed

## 2014-06-07 NOTE — Assessment & Plan Note (Addendum)
Assessment: Pt with chronic bradycardia with last EKG in 2009 with sinus bradycardia of unknown etiology who presents with no symptoms.   Plan: -Consider obtaining 12-lead EKG at next visit -Obtain TSH and free T4 to rule out hypothyroidism ---> normal

## 2014-06-07 NOTE — Progress Notes (Signed)
Patient ID: Greg [name], male   DOB: 1941/05/11, 73 y.o.   MRN: [id number]    Subjective:   Patient ID: Greg [name] male   DOB: 09/25/1941 73 y.o.   MRN: [id number]  HPI: Greg [name] is a 73 y.o. man with past medical history of hypertension, hyperlipidemia, cervical radiculopathy, [name], uterine atony, [name]   He was seen in the ED on 7/28 for rectal mass with mild bloody streaked stools and constipation. He was found to be FOBT negative and had external hemrrhoid that was easily reducible on exam. He was instructed to use Anusol suppository which his insurance approved Preparation H instead. He was also instructed to take miralax daily which he has not yet taken because he was unsure if he can mix it with water.  He has been doing Sitz baths daily. He reports his external hemorrhoid has gone down and denies rectal bleeding, pain, or pruritis. He continues to have constipation with mild straining. He has never had a colonoscopy. He denies fever, chills, night sweating, or weight loss.   He has chronic bradycardia and reports being athletic when he was young. He denies personal or family history of thyroid problems. He denies lightheadedness or syncope.   He reports chronic nasal congestion with swelling below his eyes. He is currently not taking anything for allergies. He has not tried an anti-histamine and does not want to try a nasal spray. He has never had skin allergy testing before.    Past Medical History  Diagnosis Date  . Hyperlipidemia   . Hypertension   . Epididymitis     right  . Atony of bladder     followed at [facility]; CIC 3x/day, has been treated for culture proven serratia marcesens 05/2011, 06/2011, 07/2011, 09/2011  . [name] (benign prostatic hyperplasia)     hyposensitive bladder, nodular prostate with increased PSA (10.23 in 03/2006), s/p prostate biopsy 2006 that was normal (Dr  Greg [name]) he recommended TURP.  . Sinus bradycardia   . Chronic knee pain     left  . Gingivitis   . Right shoulder pain     sx in 06/2005  . Frozen shoulder     left frozen shoulder  . Shingles    Current Outpatient Prescriptions  Medication Sig Dispense Refill  . cephALEXin (KEFLEX) 250 MG capsule Take 250 mg by mouth at bedtime. Started 04/21/14.  30 day supply.      . hydrochlorothiazide (HYDRODIURIL) 25 MG tablet Take 25 mg by mouth daily.      . hydrocortisone (ANUSOL-HC) 25 MG suppository Place 1 suppository (25 mg total) rectally 2 (two) times daily. For 7 days  14 suppository  0  . polyethylene glycol (MIRALAX / GLYCOLAX) packet Take 17 g by mouth daily.  14 each  0  . pravastatin (PRAVACHOL) 20 MG tablet Take 20 mg by mouth daily.       No current facility-administered medications for this visit.   No family history on file. History   Social History  . Marital Status: Single    Spouse Name: N/A    Number of Children: N/A  . Years of Education: 12   Occupational History  .      retired   Social History Main Topics  . Smoking status: Former Smoker    Types: Cigarettes    Quit date: 02/07/1988  . Smokeless tobacco: None  . Alcohol Use: No  . Drug Use:  No  . Sexual Activity: None   Other Topics Concern  . None   Social History Narrative  . None   Review of Systems: Review of Systems  Constitutional: Negative for fever, chills and weight loss.  HENT: Positive for congestion.   Eyes: Negative for blurred vision.  Respiratory: Negative for cough and shortness of breath.   Cardiovascular: Negative for chest pain, palpitations and leg swelling.  Gastrointestinal: Positive for constipation (chronic). Negative for nausea, vomiting, abdominal pain, diarrhea, blood in stool and melena.  Genitourinary: Positive for frequency (chronic ). Negative for dysuria, urgency and hematuria.  Neurological: Negative for dizziness and headaches.  Endo/Heme/Allergies: Positive for  environmental allergies.    Objective:  Physical Exam: Filed Vitals:   06/07/14 1038  BP: 138/83  Pulse: 52  Temp: 97.1 F (36.2 C)  TempSrc: Oral  Weight: 189 lb 1.6 oz (85.775 kg)  SpO2: 98%   Physical Exam  Constitutional: He is oriented to person, place, and time. He appears well-developed and well-nourished. No distress.  HENT:  Head: Normocephalic and atraumatic.  Right Ear: External ear normal.  Left Ear: External ear normal.  Nose: Nose normal.  Mouth/Throat: Oropharynx is clear and moist. No oropharyngeal exudate.  Eyes: Conjunctivae and EOM are normal. Pupils are equal, round, and reactive to light. Right eye exhibits no discharge. Left eye exhibits no discharge. No scleral icterus.  Neck: Normal range of motion. Neck supple.  Cardiovascular: Regular rhythm.   Bradycardic  Pulmonary/Chest: Effort normal and breath sounds normal. No respiratory distress. He has no wheezes. He has no rales.  Abdominal: Soft. Bowel sounds are normal. He exhibits no distension. There is no tenderness. There is no rebound and no guarding.  Genitourinary:  Pt deferred rectal examination  Musculoskeletal: Normal range of motion. He exhibits no edema and no tenderness.  Neurological: He is alert and oriented to person, place, and time.  Skin: Skin is warm and dry. No rash noted. He is not diaphoretic. No erythema. No pallor.  Psychiatric: He has a normal mood and affect. His behavior is normal. Judgment and thought content normal.    Assessment & Plan:   Please see problem list for problem-based assessment and plan

## 2014-06-07 NOTE — Assessment & Plan Note (Signed)
Assessment: Pt with well-controlled hypertension compliant with one-class (diuretic) anti-hypertensive therapy who presents with blood pressure of 138/83.   Plan: -BP 138/83 at goal <140/90 -Continue HCTZ 25 mg daily -Last CMP on 03/11/14 was normal

## 2014-06-07 NOTE — Assessment & Plan Note (Addendum)
Assessment: Pt with recent ED admission for rectal mass and mild bloody stools found to have external hemorrhoids on exam that was easily reducible most likely due to chronic constipation with no history of colonoscopy who presents with improved symptoms.  Plan: -Pt deferred rectal examination -FOBT on 05/18/14 was negative -Obtain CBC --> normal -Pt instructed to start miralax daily and continue preparation H and Sitz baths -Pt instructed to increase fluid and fiber intake  -Referral for screening colonoscopy (pt is agreeable)

## 2014-06-08 NOTE — Progress Notes (Signed)
INTERNAL MEDICINE TEACHING ATTENDING ADDENDUM - Nyssa Sayegh, MD: I reviewed and discussed at the time of visit with the resident Dr. Rabbani, the patient's medical history, physical examination, diagnosis and results of pertinent tests and treatment and I agree with the patient's care as documented.  

## 2014-07-06 DIAGNOSIS — R198 Other specified symptoms and signs involving the digestive system and abdomen: Secondary | ICD-10-CM | POA: Diagnosis not present

## 2014-07-15 DIAGNOSIS — R1032 Left lower quadrant pain: Secondary | ICD-10-CM | POA: Insufficient documentation

## 2014-07-15 DIAGNOSIS — Z87448 Personal history of other diseases of urinary system: Secondary | ICD-10-CM | POA: Diagnosis not present

## 2014-07-15 DIAGNOSIS — Z8619 Personal history of other infectious and parasitic diseases: Secondary | ICD-10-CM | POA: Insufficient documentation

## 2014-07-15 DIAGNOSIS — E785 Hyperlipidemia, unspecified: Secondary | ICD-10-CM | POA: Diagnosis not present

## 2014-07-15 DIAGNOSIS — Z79899 Other long term (current) drug therapy: Secondary | ICD-10-CM | POA: Diagnosis not present

## 2014-07-15 DIAGNOSIS — Z8719 Personal history of other diseases of the digestive system: Secondary | ICD-10-CM | POA: Insufficient documentation

## 2014-07-15 DIAGNOSIS — G8929 Other chronic pain: Secondary | ICD-10-CM | POA: Diagnosis not present

## 2014-07-15 DIAGNOSIS — I10 Essential (primary) hypertension: Secondary | ICD-10-CM | POA: Insufficient documentation

## 2014-07-15 DIAGNOSIS — Z8739 Personal history of other diseases of the musculoskeletal system and connective tissue: Secondary | ICD-10-CM | POA: Diagnosis not present

## 2014-07-15 DIAGNOSIS — R109 Unspecified abdominal pain: Secondary | ICD-10-CM | POA: Diagnosis not present

## 2014-07-15 DIAGNOSIS — Z792 Long term (current) use of antibiotics: Secondary | ICD-10-CM | POA: Insufficient documentation

## 2014-07-15 DIAGNOSIS — Z87891 Personal history of nicotine dependence: Secondary | ICD-10-CM | POA: Diagnosis not present

## 2014-07-15 DIAGNOSIS — R6883 Chills (without fever): Secondary | ICD-10-CM | POA: Diagnosis not present

## 2014-07-15 DIAGNOSIS — R509 Fever, unspecified: Secondary | ICD-10-CM | POA: Diagnosis not present

## 2014-07-16 ENCOUNTER — Encounter (HOSPITAL_COMMUNITY): Payer: Self-pay | Admitting: Emergency Medicine

## 2014-07-16 ENCOUNTER — Emergency Department (HOSPITAL_COMMUNITY): Payer: Medicare Other

## 2014-07-16 ENCOUNTER — Emergency Department (HOSPITAL_COMMUNITY)
Admission: EM | Admit: 2014-07-16 | Discharge: 2014-07-16 | Disposition: A | Discharge status: Home / Self Care | Payer: Medicare Other | Attending: Emergency Medicine | Admitting: Emergency Medicine

## 2014-07-16 DIAGNOSIS — R6883 Chills (without fever): Secondary | ICD-10-CM | POA: Diagnosis not present

## 2014-07-16 DIAGNOSIS — R109 Unspecified abdominal pain: Secondary | ICD-10-CM | POA: Diagnosis not present

## 2014-07-16 DIAGNOSIS — R1032 Left lower quadrant pain: Secondary | ICD-10-CM | POA: Diagnosis not present

## 2014-07-16 LAB — URINALYSIS, ROUTINE W REFLEX MICROSCOPIC
Bilirubin Urine: NEGATIVE
Glucose, UA: NEGATIVE mg/dL
Hgb urine dipstick: NEGATIVE
Ketones, ur: NEGATIVE mg/dL
Nitrite: NEGATIVE
Protein, ur: NEGATIVE mg/dL
Specific Gravity, Urine: 1.015 (ref 1.005–1.030)
UROBILINOGEN UA: 1 mg/dL (ref 0.0–1.0)
pH: 7.5 (ref 5.0–8.0)

## 2014-07-16 LAB — CBC WITH DIFFERENTIAL/PLATELET
Basophils Absolute: 0 10*3/uL (ref 0.0–0.1)
Basophils Relative: 0 % (ref 0–1)
Eosinophils Absolute: 0.1 10*3/uL (ref 0.0–0.7)
Eosinophils Relative: 1 % (ref 0–5)
HEMATOCRIT: 44 % (ref 39.0–52.0)
Hemoglobin: 14.6 g/dL (ref 13.0–17.0)
LYMPHS ABS: 1 10*3/uL (ref 0.7–4.0)
Lymphocytes Relative: 11 % — ABNORMAL LOW (ref 12–46)
MCH: 31.5 pg (ref 26.0–34.0)
MCHC: 33.2 g/dL (ref 30.0–36.0)
MCV: 95 fL (ref 78.0–100.0)
MONO ABS: 0.6 10*3/uL (ref 0.1–1.0)
Monocytes Relative: 7 % (ref 3–12)
Neutro Abs: 7.2 10*3/uL (ref 1.7–7.7)
Neutrophils Relative %: 81 % — ABNORMAL HIGH (ref 43–77)
Platelets: 271 10*3/uL (ref 150–400)
RBC: 4.63 MIL/uL (ref 4.22–5.81)
RDW: 13.4 % (ref 11.5–15.5)
WBC: 8.9 10*3/uL (ref 4.0–10.5)

## 2014-07-16 LAB — COMPREHENSIVE METABOLIC PANEL
ALT: 8 U/L (ref 0–53)
AST: 18 U/L (ref 0–37)
Albumin: 3.9 g/dL (ref 3.5–5.2)
Alkaline Phosphatase: 78 U/L (ref 39–117)
Anion gap: 12 (ref 5–15)
BUN: 13 mg/dL (ref 6–23)
CALCIUM: 8.8 mg/dL (ref 8.4–10.5)
CO2: 32 meq/L (ref 19–32)
CREATININE: 1.02 mg/dL (ref 0.50–1.35)
Chloride: 101 mEq/L (ref 96–112)
GFR calc non Af Amer: 70 mL/min — ABNORMAL LOW (ref >90)
GFR, EST AFRICAN AMERICAN: 82 mL/min — AB (ref >90)
GLUCOSE: 102 mg/dL — AB (ref 70–99)
Potassium: 4.4 mEq/L (ref 3.7–5.3)
Sodium: 145 mEq/L (ref 137–147)
TOTAL PROTEIN: 7.8 g/dL (ref 6.0–8.3)
Total Bilirubin: 0.4 mg/dL (ref 0.3–1.2)

## 2014-07-16 LAB — URINE MICROSCOPIC-ADD ON

## 2014-07-16 MED ORDER — ACETAMINOPHEN 325 MG PO TABS
650.0000 mg | ORAL_TABLET | Freq: Once | ORAL | Status: AC
  Administered 2014-07-16: 650 mg via ORAL
  Filled 2014-07-16: qty 2

## 2014-07-16 NOTE — ED Notes (Signed)
Pt. reports chills with intermittent left lower abdominal pain onset this evening , denies nausea and vomitting / no diarrhea .

## 2014-07-16 NOTE — Discharge Instructions (Signed)
Please make sure you followup with your primary Dr. and gastroenterology as you'll need a colonoscopy to look for signs of cancer or other causes.  If you were given medicines take as directed.  If you are on coumadin or contraceptives realize their levels and effectiveness is altered by many different medicines.  If you have any reaction (rash, tongues swelling, other) to the medicines stop taking and see a physician.   Please follow up as directed and return to the ER or see a physician for new or worsening symptoms.  Thank you. Filed Vitals:   07/15/14 2358 07/16/14 0147 07/16/14 0201  BP: 144/82 149/87   Pulse: 82 83   Temp: 98.1 F (36.7 C)  98 F (36.7 C)  TempSrc: Oral  Oral  Resp: 16    SpO2: 98% 95%

## 2014-07-16 NOTE — ED Provider Notes (Signed)
CSN: [id number]     Arrival date & time 07/15/14  2352 History   First MD Initiated Contact with Patient 07/16/14 0143     Chief Complaint  Patient presents with  . Chills     (Consider location/radiation/quality/duration/timing/severity/associated sxs/prior Treatment) HPI Comments: 73 year old male high blood pressure, bladder  atony, self caths daily , lipids presents with chills, left lower abdominal pain intermittent since this evening. Patient denies vomiting, fevers or diarrhea. No blood in stools. Patient currently has no abdominal pain and says it's intermittent sharp. No kidney stone history. Patient has been well recently before this evening.  The history is provided by the patient.    Past Medical History  Diagnosis Date  . Hyperlipidemia   . Hypertension   . Epididymitis     right  . Atony of bladder     followed at [facility]; CIC 3x/day, has been treated for culture proven serratia marcesens 05/2011, 06/2011, 07/2011, 09/2011  . [name] (benign prostatic hyperplasia)     hyposensitive bladder, nodular prostate with increased PSA (10.23 in 03/2006), s/p prostate biopsy 2006 that was normal (Dr Jeffie Pollock) he recommended TURP.  . Sinus bradycardia   . Chronic knee pain     left  . Gingivitis   . Right shoulder pain     sx in 06/2005  . Frozen shoulder     left frozen shoulder  . Shingles    Past Surgical History  Procedure Laterality Date  . Vasectomy    . Hydrocelectomy  11/14/2010    left hydrocelectomy  . Right shoulder arthroscopic surgery  06/2005  . Transurethral resection of prostate      status post TURP 2/2 [name] in 2008  . Knee surgery      L knee   No family history on file. History  Substance Use Topics  . Smoking status: Former Smoker    Types: Cigarettes    Quit date: 02/07/1988  . Smokeless tobacco: Not on file  . Alcohol Use: No    Review of Systems  Constitutional: Positive for chills. Negative for fever.  HENT: Negative for congestion.    Eyes: Negative for visual disturbance.  Respiratory: Negative for shortness of breath.   Cardiovascular: Negative for chest pain.  Gastrointestinal: Negative for vomiting and abdominal pain.  Genitourinary: Positive for flank pain. Negative for dysuria.  Musculoskeletal: Negative for back pain, neck pain and neck stiffness.  Skin: Negative for rash.  Neurological: Positive for headaches (gradual onset generalized). Negative for light-headedness.      Allergies  Ciprofloxacin hcl and Ibuprofen  Home Medications   Prior to Admission medications   Medication Sig Start Date End Date Taking? Authorizing Provider  cephALEXin (KEFLEX) 250 MG capsule Take 250 mg by mouth at bedtime. Started 04/21/14.  30 day supply. 04/21/14  Yes Historical Provider, MD  cetirizine (ZYRTEC) 10 MG tablet Take 1 tablet (10 mg total) by mouth daily. 06/07/14  Yes Marjan Rabbani, MD  hydrochlorothiazide (HYDRODIURIL) 25 MG tablet Take 25 mg by mouth daily.   Yes Historical Provider, MD  phenylephrine-shark liver oil-mineral oil-petrolatum (PREPARATION H) 0.25-3-14-71.9 % rectal ointment Place 1 application rectally 2 (two) times daily as needed for hemorrhoids. 06/07/14  Yes Marjan Rabbani, MD  polyethylene glycol (MIRALAX / GLYCOLAX) packet Take 17 g by mouth daily. 05/18/14  Yes Mercedes Strupp Camprubi-Soms, PA-C  pravastatin (PRAVACHOL) 20 MG tablet Take 20 mg by mouth daily.   Yes Historical Provider, MD   BP 149/87  Pulse 83  Temp(Src)  98 F (36.7 C) (Oral)  Resp 16  SpO2 95% Physical Exam  Nursing note and vitals reviewed. Constitutional: He is oriented to person, place, and time. He appears well-developed and well-nourished.  HENT:  Head: Normocephalic and atraumatic.  Eyes: Conjunctivae are normal. Right eye exhibits no discharge. Left eye exhibits no discharge.  Neck: Normal range of motion. Neck supple. No tracheal deviation present.  Cardiovascular: Normal rate and regular rhythm.    Pulmonary/Chest: Effort normal and breath sounds normal.  Abdominal: Soft. He exhibits no distension. There is no tenderness. There is no guarding.  Genitourinary:  Very mild prostate tenderness difficult for patient to tell if pressure related or tender. No gross blood in the stool sample  Musculoskeletal: He exhibits no edema.  Neurological: He is alert and oriented to person, place, and time.  Skin: Skin is warm. No rash noted.  Psychiatric: He has a normal mood and affect.    ED Course  Procedures (including critical care time) Labs Review Labs Reviewed  CBC WITH DIFFERENTIAL - Abnormal; Notable for the following:    Neutrophils Relative % 81 (*)    Lymphocytes Relative 11 (*)    All other components within normal limits  COMPREHENSIVE METABOLIC PANEL - Abnormal; Notable for the following:    Glucose, Bld 102 (*)    GFR calc non Af Amer 70 (*)    GFR calc Af Amer 82 (*)    All other components within normal limits  URINALYSIS, ROUTINE W REFLEX MICROSCOPIC - Abnormal; Notable for the following:    Leukocytes, UA TRACE (*)    All other components within normal limits  URINE MICROSCOPIC-ADD ON    Imaging Review Ct Renal Stone Study  07/16/2014   CLINICAL DATA:  Left flank pain with chills  EXAM: CT ABDOMEN AND PELVIS WITHOUT CONTRAST  TECHNIQUE: Multidetector CT imaging of the abdomen and pelvis was performed following the standard protocol without IV contrast.  COMPARISON:  07/21/2013  FINDINGS: BODY WALL: Fatty bilateral inguinal hernias.  LOWER CHEST: No acute abnormality.  ABDOMEN/PELVIS:  Liver: No focal abnormality.  Biliary: No evidence of biliary obstruction or stone.  Pancreas: Unremarkable.  Spleen: Unremarkable.  Adrenals: Unremarkable.  Kidneys and ureters: No hydronephrosis or stone.  Bladder: Unremarkable.  Reproductive: Enlarged prostate with central defect consistent with previous trans urethral resection of prostate (TURP).  Bowel: There is no bowel obstruction.  Prominence stool, which may reflect constipation. The sigmoid is redundant. There is a circumferential area of wall thickening in the sigmoid colon which could easily represent an area of nondistention, but in retrospect was likely present in 2014. No adenopathy in this distribution.  Retroperitoneum: No mass or adenopathy.  Peritoneum: No ascites or pneumoperitoneum.  Vascular: No acute abnormality.  OSSEOUS: Diffuse degenerative disc and facet disease. 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  IMPRESSION: 1. No urolithiasis or hydronephrosis. 2. Concerning narrowing of the sigmoid colon. Annular neoplasm is the primary concern, recommend GI referral.   Electronically Signed   By: Jorje Guild M.D.   On: 07/16/2014 03:48     EKG Interpretation None      MDM   Final diagnoses:  Abdominal pain, left lower quadrant   Patient with intermittent flank pain and chills discussed likely cause urine infection and with patient self cathing more complicated. Possible mild prostatitis. Urinalysis pending. CT stone study ordered to look for kidney stone with intermittent symptoms and no history of stone.  Patient has no  abdominal pain the ER. CT scan concerning for possible colon cancer. Discussed importance of followup with gastroenterology for colonoscopy and further evaluation primary Dr. Results and differential diagnosis were discussed with the patient/parent/guardian. Close follow up outpatient was discussed, comfortable with the plan.   Medications  acetaminophen (TYLENOL) tablet 650 mg (not administered)    Filed Vitals:   07/15/14 2358 07/16/14 0147 07/16/14 0201  BP: 144/82 149/87   Pulse: 82 83   Temp: 98.1 F (36.7 C)  98 F (36.7 C)  TempSrc: Oral  Oral  Resp: 16    SpO2: 98% 95%     Final diagnoses:  Abdominal pain, left lower quadrant        Mariea Clonts, MD 07/16/14 [phone]

## 2014-07-16 NOTE — ED Notes (Signed)
Provided patient with a urinal- patient states that he is unable to go at this time.

## 2014-07-16 NOTE — ED Notes (Signed)
Dr Zavitz at bedside  

## 2014-07-22 ENCOUNTER — Encounter: Payer: Self-pay | Admitting: Internal Medicine

## 2014-07-22 ENCOUNTER — Ambulatory Visit (INDEPENDENT_AMBULATORY_CARE_PROVIDER_SITE_OTHER): Payer: Medicare Other | Admitting: Internal Medicine

## 2014-07-22 VITALS — BP 139/76 | HR 52 | Temp 97.6°F | Wt 189.0 lb

## 2014-07-22 DIAGNOSIS — Z0000 Encounter for general adult medical examination without abnormal findings: Secondary | ICD-10-CM | POA: Diagnosis not present

## 2014-07-22 DIAGNOSIS — I10 Essential (primary) hypertension: Secondary | ICD-10-CM

## 2014-07-22 DIAGNOSIS — K648 Other hemorrhoids: Secondary | ICD-10-CM

## 2014-07-22 DIAGNOSIS — K639 Disease of intestine, unspecified: Secondary | ICD-10-CM

## 2014-07-22 DIAGNOSIS — Z23 Encounter for immunization: Secondary | ICD-10-CM

## 2014-07-22 DIAGNOSIS — K644 Residual hemorrhoidal skin tags: Secondary | ICD-10-CM

## 2014-07-22 NOTE — Progress Notes (Addendum)
Patient ID: Greg [name], male   DOB: 08-22-1941, 73 y.o.   MRN: [id number]   Subjective:   HPI: [name], hyperlipidemia, cervical radiculopathy, [name], uterine atony, and chronic sinus bradycardia who presents with for ED abdominal pain. Abdominal CT scan on 07/16/2014 "Concerning narrowing of the sigmoid colon. Annular neoplasm is the primary concern, recommend GI referral"  He was referred to outpatient GI on 06/07/2014 but he has an appointment with Dr Paulita Fujita on 08/20/2014.   He has no complaints today. He was most recently started on MiraLax for constipation in the setting of external hemorrhoids. He tried it with good results for the week, but he wonders how long he can safely continue to use it. He denies abdominal pain, nausea, vomiting, blood in stools, or melena. No fevers.    ROS: Constitutional: Denies fever, chills, diaphoresis, appetite change and fatigue.  Respiratory: Denies SOB, DOE, cough, chest tightness, and wheezing. Denies chest pain. CVS: No chest pain, palpitations and leg swelling.  GI: No abdominal pain, nausea, vomiting, bloody stools GU: No dysuria, frequency, hematuria, or flank pain.  MSK: No myalgias, back pain, joint swelling, arthralgias  Psych: No depression symptoms. No SI or SA.   Past Medical History  Diagnosis Date  . Hyperlipidemia   . Hypertension   . Epididymitis     right  . Atony of bladder     followed at [facility]; CIC 3x/day, has been treated for culture proven serratia marcesens 05/2011, 06/2011, 07/2011, 09/2011  . [name] (benign prostatic hyperplasia)     hyposensitive bladder, nodular prostate with increased PSA (10.23 in 03/2006), s/p prostate biopsy 2006 that was normal (Dr Jeffie Pollock) he recommended TURP.  . Sinus bradycardia   . Chronic knee pain     left  . Gingivitis   . Right shoulder pain     sx in 06/2005  . Frozen shoulder     left frozen shoulder  .  Shingles    Current Outpatient Prescriptions  Medication Sig Dispense Refill  . cephALEXin (KEFLEX) 250 MG capsule Take 250 mg by mouth at bedtime. Started 04/21/14.  30 day supply.      . cetirizine (ZYRTEC) 10 MG tablet Take 1 tablet (10 mg total) by mouth daily.  90 tablet  2  . hydrochlorothiazide (HYDRODIURIL) 25 MG tablet Take 25 mg by mouth daily.      . phenylephrine-shark liver oil-mineral oil-petrolatum (PREPARATION H) 0.25-3-14-71.9 % rectal ointment Place 1 application rectally 2 (two) times daily as needed for hemorrhoids.  30 g  0  . polyethylene glycol (MIRALAX / GLYCOLAX) packet Take 17 g by mouth daily.  14 each  0  . pravastatin (PRAVACHOL) 20 MG tablet Take 20 mg by mouth daily.       No current facility-administered medications for this visit.   No family history on file. History   Social History  . Marital Status: Single    Spouse Name: N/A    Number of Children: N/A  . Years of Education: 12   Occupational History  .      retired   Social History Main Topics  . Smoking status: Former Smoker    Types: Cigarettes    Quit date: 02/07/1988  . Smokeless tobacco: Not on file  . Alcohol Use: No  . Drug Use: No  . Sexual Activity: Not on file   Other Topics Concern  . Not on file   Social History  Narrative  . No narrative on file    Objective:  Physical Exam: Filed Vitals:   07/22/14 0912  BP: 139/76  Pulse: 52  Temp: 97.6 F (36.4 C)  TempSrc: Oral  Weight: 189 lb (85.73 kg)  SpO2: 99%   General: Well nourished. No acute distress.  HEENT: Normal oral mucosa. MMM.  Lungs: CTA bilaterally. Heart: RRR; no extra sounds or murmurs  Abdomen: Non-distended, normal BS, soft, nontender; no hepatosplenomegaly  Extremities: No pedal edema. No joint swelling or tenderness. Neurologic: Normal EOM,  Alert and oriented x3. No obvious neurologic/cranial nerve deficits.  Assessment & Plan:  I have discussed my assessment and plan  with  my attending in the  clinic, Dr. Eppie Gibson as detailed under problem based charting.

## 2014-07-22 NOTE — Patient Instructions (Signed)
General Instructions: Please use Miralax as needed  Please be sure to follow up with Dr Paulita Fujita for your colonoscopy  Please return to clinic to see Dr Hulen Luster in 3 months   Treatment Goals:  Goals (1 Years of Data) as of 07/22/14         As of Today 07/16/14 07/16/14 07/15/14 06/07/14     Blood Pressure    . Blood Pressure < 140/90  139/76 128/73 149/87 144/82 138/83     Result Component    . LDL CALC < 130            Progress Toward Treatment Goals:  Treatment Goal 07/22/2014  Blood pressure at goal    Self Care Goals & Plans:  Self Care Goal 07/22/2014  Manage my medications take my medicines as prescribed; bring my medications to every visit; refill my medications on time; follow the sick day instructions if I am sick  Eat healthy foods -  Be physically active -    No flowsheet data found.   Care Management & Community Referrals:  Referral 03/11/2014  Referrals made for care management support none needed

## 2014-07-22 NOTE — Assessment & Plan Note (Addendum)
No complication currently. Has had constipation before but that is better with Miralax. I have advised him to cont using Miralax on a prn basis.

## 2014-07-22 NOTE — Assessment & Plan Note (Signed)
Given a flu shot today.

## 2014-07-22 NOTE — Assessment & Plan Note (Signed)
BP Readings from Last 3 Encounters:  07/22/14 139/76  07/16/14 128/73  06/07/14 138/83    Lab Results  Component Value Date   NA 145 07/16/2014   K 4.4 07/16/2014   CREATININE 1.02 07/16/2014    Assessment: Blood pressure control: controlled Progress toward BP goal:  at goal Comments: compliant with medications  Plan: Medications:  continue current medications Educational resources provided:   Self management tools provided:   Other plans: none

## 2014-07-22 NOTE — Assessment & Plan Note (Signed)
Recent abdominal CT w/o contrast "Concerning narrowing of the sigmoid colon. Annular neoplasm is the primary concern, recommend GI referral". No symptoms at this time. He has has a colonoscopy before >>unsure about the results at that time but he as told that "there was no problem" Plan  - has appointment with GI on 08/20/2014 for a colonoscopy.  - will follow up with PCP

## 2014-07-22 NOTE — Progress Notes (Signed)
Case discussed with Dr. Kazibwe soon after the resident saw the patient.  We reviewed the resident's history and exam and pertinent patient test results.  I agree with the assessment, diagnosis, and plan of care documented in the resident's note. 

## 2014-07-29 ENCOUNTER — Other Ambulatory Visit: Payer: Self-pay | Admitting: *Deleted

## 2014-08-02 MED ORDER — POLYETHYLENE GLYCOL 3350 17 G PO PACK: 17.0000 g | PACK | Freq: Every day | ORAL | Status: DC

## 2014-08-03 NOTE — Telephone Encounter (Signed)
Rx called in to pharmacy. 

## 2014-08-20 DIAGNOSIS — Z538 Procedure and treatment not carried out for other reasons: Secondary | ICD-10-CM | POA: Diagnosis not present

## 2014-08-20 DIAGNOSIS — R194 Change in bowel habit: Secondary | ICD-10-CM | POA: Diagnosis not present

## 2014-09-28 ENCOUNTER — Other Ambulatory Visit: Payer: Self-pay | Admitting: *Deleted

## 2014-09-29 MED ORDER — HYDROCHLOROTHIAZIDE 25 MG PO TABS: 25.0000 mg | ORAL_TABLET | Freq: Every day | ORAL | Status: DC

## 2014-09-30 ENCOUNTER — Ambulatory Visit (INDEPENDENT_AMBULATORY_CARE_PROVIDER_SITE_OTHER): Payer: Medicare Other | Admitting: Internal Medicine

## 2014-09-30 ENCOUNTER — Encounter: Payer: Self-pay | Admitting: Internal Medicine

## 2014-09-30 ENCOUNTER — Other Ambulatory Visit: Payer: Self-pay | Admitting: Internal Medicine

## 2014-09-30 VITALS — BP 128/82 | HR 52 | Temp 97.6°F | Ht 71.0 in | Wt 188.7 lb

## 2014-09-30 DIAGNOSIS — K648 Other hemorrhoids: Secondary | ICD-10-CM | POA: Diagnosis not present

## 2014-09-30 DIAGNOSIS — N312 Flaccid neuropathic bladder, not elsewhere classified: Secondary | ICD-10-CM | POA: Diagnosis not present

## 2014-09-30 DIAGNOSIS — J309 Allergic rhinitis, unspecified: Secondary | ICD-10-CM

## 2014-09-30 DIAGNOSIS — R001 Bradycardia, unspecified: Secondary | ICD-10-CM

## 2014-09-30 DIAGNOSIS — K644 Residual hemorrhoidal skin tags: Secondary | ICD-10-CM

## 2014-09-30 MED ORDER — POLYETHYLENE GLYCOL 3350 17 G PO PACK: 17.0000 g | PACK | Freq: Every day | ORAL | Status: DC

## 2014-09-30 MED ORDER — HYDROCHLOROTHIAZIDE 25 MG PO TABS: 25.0000 mg | ORAL_TABLET | Freq: Every day | ORAL | Status: DC

## 2014-09-30 MED ORDER — CETIRIZINE HCL 10 MG PO TABS: 10.0000 mg | ORAL_TABLET | Freq: Every day | ORAL | Status: DC

## 2014-09-30 MED ORDER — PRAVASTATIN SODIUM 20 MG PO TABS: 20.0000 mg | ORAL_TABLET | Freq: Every day | ORAL | Status: DC

## 2014-09-30 NOTE — Assessment & Plan Note (Addendum)
Has had TURP procedure in the past. Also with hx of hypotonic bladder, self caths. On keflex prescribed by urologist.

## 2014-09-30 NOTE — Assessment & Plan Note (Addendum)
Asymptomatic. Not on Beta blocker. LAst EKG- 2009- sinus brady, sinus arrythmia. TSH and free T4- WNL. No change in symptoms, compared to prior, will therefore not get an EKG at this time.

## 2014-09-30 NOTE — Assessment & Plan Note (Signed)
Symptoms suggestive of allergic rhinitis. Response to citirizine in teh past.  Plan- Cont cetirizine- 10mg  daily.

## 2014-09-30 NOTE — Patient Instructions (Signed)
General Instructions:  Please keep your appointments.  We have sent in all your prescriptions.   Please bring your medicines with you each time you come to clinic.  Medicines may include prescription medications, over-the-counter medications, herbal remedies, eye drops, vitamins, or other pills.

## 2014-09-30 NOTE — Progress Notes (Signed)
Patient ID: Greg [name], male   DOB: 1941/04/02, 73 y.o.   MRN: [id number]   Subjective:   Patient ID: Greg [name] male   DOB: August 24, 1941 73 y.o.   MRN: [id number]  HPI: Mr.[name] is a 73 y.o. with PMH listed below. Presented today with complaints of constipation for the past 2 weeks, he usually takes miralax, this helped. He ran out and has not refilled medication. Pt also complaints of runny nose- 1 week, with sneezing, mild if any itching of his eyes. No fever, no sorethroat, no sick cobntacts. Has had the flu shot. Has had this problem previously and was prescribed allerygy pills- Zyrtec, which helped, he has not taken this in 2 days and so the symptoms started again. He ran out of meds.   Past Medical History  Diagnosis Date  . Hyperlipidemia   . Hypertension   . Epididymitis     right  . Atony of bladder     followed at [facility]; CIC 3x/day, has been treated for culture proven serratia marcesens 05/2011, 06/2011, 07/2011, 09/2011  . [name] (benign prostatic hyperplasia)     hyposensitive bladder, nodular prostate with increased PSA (10.23 in 03/2006), s/p prostate biopsy 2006 that was normal (Dr Jeffie Pollock) he recommended TURP.  . Sinus bradycardia   . Chronic knee pain     left  . Gingivitis   . Right shoulder pain     sx in 06/2005  . Frozen shoulder     left frozen shoulder  . Shingles    Current Outpatient Prescriptions  Medication Sig Dispense Refill  . cephALEXin (KEFLEX) 250 MG capsule Take 250 mg by mouth at bedtime. Started 04/21/14.  30 day supply.    . cetirizine (ZYRTEC) 10 MG tablet Take 1 tablet (10 mg total) by mouth daily. 90 tablet 2  . hydrochlorothiazide (HYDRODIURIL) 25 MG tablet Take 1 tablet (25 mg total) by mouth daily. 30 tablet 6  . phenylephrine-shark liver oil-mineral oil-petrolatum (PREPARATION H) 0.25-3-14-71.9 % rectal ointment Place 1 application rectally 2 (two) times daily as needed for hemorrhoids. 30 g 0  . polyethylene glycol  (MIRALAX / GLYCOLAX) packet Take 17 g by mouth daily. 14 each 0  . pravastatin (PRAVACHOL) 20 MG tablet Take 20 mg by mouth daily.     No current facility-administered medications for this visit.   No family history on file. History   Social History  . Marital Status: Single    Spouse Name: N/A    Number of Children: N/A  . Years of Education: 12   Occupational History  .      retired   Social History Main Topics  . Smoking status: Former Smoker    Types: Cigarettes    Quit date: 02/07/1988  . Smokeless tobacco: None  . Alcohol Use: No  . Drug Use: No  . Sexual Activity: None   Other Topics Concern  . None   Social History Narrative   Review of Systems: CONSTITUTIONAL- No Fever, weightloss, night sweat or change in appetite. SKIN- No Rash, colour changes or itching. HEAD- Occasional Headache or no dizziness. EYES- No Vision loss, pain, redness, double or blurred vision. Mouth/throat- No Sorethroat, dentures, or bleeding gums. RESPIRATORY- No Cough or SOB. CARDIAC- No Palpitations, DOE, PND or chest pain. GI- No nausea, vomiting, diarrhoea, constipation, abd pain. URINARY- Self Caths himself- 2ce day. NEUROLOGIC- No Numbness, syncope, seizures or burning. [facility]- Denies depression or anxiety.  Objective:  Physical Exam: Filed Vitals:   09/30/14 8638  BP: 128/82  Pulse: 52  Temp: 97.6 F (36.4 C)  TempSrc: Oral  Height: 5\' 11"  (1.803 m)  Weight: 188 lb 11.2 oz (85.594 kg)  SpO2: 100%   GENERAL- alert, co-operative, appears as stated age, not in any distress. HEENT- Atraumatic, normocephalic, PERRL, EOMI, no cervical LN enlargement, neck supple. CARDIAC- RRR, no murmurs, rubs or gallops. RESP- Moving equal volumes of air, and clear to auscultation bilaterally, no wheezes or crackles. ABDOMEN- Soft, nontender, no guarding or rebound, no palpable masses or organomegaly, bowel sounds present. BACK- Normal curvature of the spine, No tenderness along the  vertebrae, no CVA tenderness. NEURO- No obvious Cr N abnormality, strenght upper and lower extremities-- intact, Gait- Normal. EXTREMITIES- pulse 2+, symmetric, no pedal edema. SKIN- Warm, dry, No rash or lesion. PSYCH- Normal mood and affect, appropriate thought content and speech.  Assessment & Plan:   The patient's case and plan of care was discussed with attending physician, Dr. Marinda Elk.  Please see problem based charting for assessment and plan.

## 2014-09-30 NOTE — Assessment & Plan Note (Addendum)
F

## 2014-09-30 NOTE — Assessment & Plan Note (Signed)
Scheduled for repeat colonoscopy. Had one done last month, but bowel prep was not sufficient, so patient asked to repeat procedure.

## 2014-09-30 NOTE — Assessment & Plan Note (Addendum)
Still constipated. Ran out of miralax. Represcibed.

## 2014-09-30 NOTE — Assessment & Plan Note (Signed)
Follows with [facility]. Dr Jeffie Pollock. Self caths- 2ce  Day. On daily keflex.

## 2014-10-01 NOTE — Progress Notes (Signed)
Internal Medicine Clinic Attending  Case discussed with Dr. Emokpae soon after the resident saw the patient.  We reviewed the resident's history and exam and pertinent patient test results.  I agree with the assessment, diagnosis, and plan of care documented in the resident's note. 

## 2014-10-06 DIAGNOSIS — N3020 Other chronic cystitis without hematuria: Secondary | ICD-10-CM | POA: Diagnosis not present

## 2014-10-06 DIAGNOSIS — R339 Retention of urine, unspecified: Secondary | ICD-10-CM | POA: Diagnosis not present

## 2014-10-06 DIAGNOSIS — N312 Flaccid neuropathic bladder, not elsewhere classified: Secondary | ICD-10-CM | POA: Diagnosis not present

## 2014-10-06 DIAGNOSIS — R300 Dysuria: Secondary | ICD-10-CM | POA: Diagnosis not present

## 2014-10-26 DIAGNOSIS — N3020 Other chronic cystitis without hematuria: Secondary | ICD-10-CM | POA: Diagnosis not present

## 2014-10-26 DIAGNOSIS — R300 Dysuria: Secondary | ICD-10-CM | POA: Diagnosis not present

## 2014-11-04 ENCOUNTER — Encounter (HOSPITAL_COMMUNITY): Payer: Self-pay | Admitting: Emergency Medicine

## 2014-11-04 ENCOUNTER — Emergency Department (HOSPITAL_COMMUNITY)
Admission: EM | Admit: 2014-11-04 | Discharge: 2014-11-04 | Disposition: A | Discharge status: Home / Self Care | Payer: Medicare Other | Attending: Emergency Medicine | Admitting: Emergency Medicine

## 2014-11-04 DIAGNOSIS — G8929 Other chronic pain: Secondary | ICD-10-CM | POA: Diagnosis not present

## 2014-11-04 DIAGNOSIS — Z79899 Other long term (current) drug therapy: Secondary | ICD-10-CM | POA: Insufficient documentation

## 2014-11-04 DIAGNOSIS — Z87891 Personal history of nicotine dependence: Secondary | ICD-10-CM | POA: Insufficient documentation

## 2014-11-04 DIAGNOSIS — E785 Hyperlipidemia, unspecified: Secondary | ICD-10-CM | POA: Diagnosis not present

## 2014-11-04 DIAGNOSIS — I10 Essential (primary) hypertension: Secondary | ICD-10-CM | POA: Insufficient documentation

## 2014-11-04 DIAGNOSIS — R300 Dysuria: Secondary | ICD-10-CM | POA: Diagnosis present

## 2014-11-04 DIAGNOSIS — N390 Urinary tract infection, site not specified: Secondary | ICD-10-CM | POA: Insufficient documentation

## 2014-11-04 DIAGNOSIS — Z8739 Personal history of other diseases of the musculoskeletal system and connective tissue: Secondary | ICD-10-CM | POA: Diagnosis not present

## 2014-11-04 DIAGNOSIS — Z8619 Personal history of other infectious and parasitic diseases: Secondary | ICD-10-CM | POA: Insufficient documentation

## 2014-11-04 DIAGNOSIS — B9689 Other specified bacterial agents as the cause of diseases classified elsewhere: Secondary | ICD-10-CM | POA: Diagnosis not present

## 2014-11-04 LAB — URINALYSIS, ROUTINE W REFLEX MICROSCOPIC
Bilirubin Urine: NEGATIVE
Glucose, UA: NEGATIVE mg/dL
KETONES UR: NEGATIVE mg/dL
NITRITE: NEGATIVE
PH: 6.5 (ref 5.0–8.0)
Protein, ur: NEGATIVE mg/dL
Specific Gravity, Urine: 1.011 (ref 1.005–1.030)
Urobilinogen, UA: 0.2 mg/dL (ref 0.0–1.0)

## 2014-11-04 LAB — URINE MICROSCOPIC-ADD ON

## 2014-11-04 MED ORDER — CEPHALEXIN 500 MG PO CAPS: 500.0000 mg | ORAL_CAPSULE | Freq: Four times a day (QID) | ORAL | Status: DC

## 2014-11-04 NOTE — ED Notes (Signed)
Pt c/o UTI sx with some dysuria; pt sts self caths and hx of same in past

## 2014-11-04 NOTE — ED Provider Notes (Signed)
CSN: [id number]     Arrival date & time 11/04/14  1048 History   First MD Initiated Contact with Patient 11/04/14 1112     Chief Complaint  Patient presents with  . Urinary Tract Infection     (Consider location/radiation/quality/duration/timing/severity/associated sxs/prior Treatment) HPI Comments: Patient with PMH of [name], TURP, atony of bladder, presents to the ED with a chief complaint of dysuria.  Patient reports burning and stinging with urination.  States that symptoms have been ongoing for the past couple of days.  States that he self-caths since TURP, but only 1-2 times per day.  He denies any fevers, chills, nausea, vomiting, diarrhea, or abdominal pain.  There are no modifying factors.  The history is provided by the patient. No language interpreter was used.    Past Medical History  Diagnosis Date  . Hyperlipidemia   . Hypertension   . Epididymitis     right  . Atony of bladder     followed at [facility]; CIC 3x/day, has been treated for culture proven serratia marcesens 05/2011, 06/2011, 07/2011, 09/2011  . [name] (benign prostatic hyperplasia)     hyposensitive bladder, nodular prostate with increased PSA (10.23 in 03/2006), s/p prostate biopsy 2006 that was normal (Dr Jeffie Pollock) he recommended TURP.  . Sinus bradycardia   . Chronic knee pain     left  . Gingivitis   . Right shoulder pain     sx in 06/2005  . Frozen shoulder     left frozen shoulder  . Shingles    Past Surgical History  Procedure Laterality Date  . Vasectomy    . Hydrocelectomy  11/14/2010    left hydrocelectomy  . Right shoulder arthroscopic surgery  06/2005  . Transurethral resection of prostate      status post TURP 2/2 [name] in 2008  . Knee surgery      L knee   History reviewed. No pertinent family history. History  Substance Use Topics  . Smoking status: Former Smoker    Types: Cigarettes    Quit date: 02/07/1988  . Smokeless tobacco: Not on file  . Alcohol Use: No    Review of  Systems  Constitutional: Negative for fever and chills.  Respiratory: Negative for shortness of breath.   Cardiovascular: Negative for chest pain.  Gastrointestinal: Negative for nausea, vomiting, diarrhea and constipation.  Genitourinary: Positive for dysuria.  All other systems reviewed and are negative.     Allergies  Ciprofloxacin hcl and Ibuprofen  Home Medications   Prior to Admission medications   Medication Sig Start Date End Date Taking? Authorizing Provider  cephALEXin (KEFLEX) 250 MG capsule Take 250 mg by mouth at bedtime. Started 04/21/14.  30 day supply. 04/21/14   Historical Provider, MD  cetirizine (ZYRTEC) 10 MG tablet Take 1 tablet (10 mg total) by mouth daily. 09/30/14   Ejiroghene Arlyce Dice, MD  hydrochlorothiazide (HYDRODIURIL) 25 MG tablet Take 1 tablet (25 mg total) by mouth daily. 09/30/14   Ejiroghene Arlyce Dice, MD  phenylephrine-shark liver oil-mineral oil-petrolatum (PREPARATION H) 0.25-3-14-71.9 % rectal ointment Place 1 application rectally 2 (two) times daily as needed for hemorrhoids. 06/07/14   Juluis Mire, MD  polyethylene glycol (MIRALAX / GLYCOLAX) packet Take 17 g by mouth daily. 09/30/14   Ejiroghene Arlyce Dice, MD  polyethylene glycol powder (GLYCOLAX/MIRALAX) powder MIX 1 CAPFUL(17 GRAMS) IN BEVERAGE AND DRINK DAILY 10/01/14   Julious Oka, MD  pravastatin (PRAVACHOL) 20 MG tablet Take 1 tablet (20 mg total) by mouth daily.  09/30/14   Ejiroghene E Emokpae, MD   BP 125/75 mmHg  Pulse 62  Temp(Src) 98.5 F (36.9 C) (Oral)  Resp 20  SpO2 100% Physical Exam  Constitutional: He is oriented to person, place, and time. He appears well-developed and well-nourished.  HENT:  Head: Normocephalic and atraumatic.  Eyes: Conjunctivae and EOM are normal. Pupils are equal, round, and reactive to light. Right eye exhibits no discharge. Left eye exhibits no discharge. No scleral icterus.  Neck: Normal range of motion. Neck supple. No JVD present.   Cardiovascular: Normal rate, regular rhythm and normal heart sounds.  Exam reveals no gallop and no friction rub.   No murmur heard. Pulmonary/Chest: Effort normal and breath sounds normal. No respiratory distress. He has no wheezes. He has no rales. He exhibits no tenderness.  Abdominal: Soft. He exhibits no distension and no mass. There is no tenderness. There is no rebound and no guarding.  Musculoskeletal: Normal range of motion. He exhibits no edema or tenderness.  Neurological: He is alert and oriented to person, place, and time.  Skin: Skin is warm and dry.  Psychiatric: He has a normal mood and affect. His behavior is normal. Judgment and thought content normal.  Nursing note and vitals reviewed.   ED Course  Procedures (including critical care time) Results for orders placed or performed during the hospital encounter of 11/04/14  Urinalysis, Routine w reflex microscopic  Result Value Ref Range   Color, Urine GREEN (A) YELLOW   APPearance CLOUDY (A) CLEAR   Specific Gravity, Urine 1.011 1.005 - 1.030   pH 6.5 5.0 - 8.0   Glucose, UA NEGATIVE NEGATIVE mg/dL   Hgb urine dipstick SMALL (A) NEGATIVE   Bilirubin Urine NEGATIVE NEGATIVE   Ketones, ur NEGATIVE NEGATIVE mg/dL   Protein, ur NEGATIVE NEGATIVE mg/dL   Urobilinogen, UA 0.2 0.0 - 1.0 mg/dL   Nitrite NEGATIVE NEGATIVE   Leukocytes, UA LARGE (A) NEGATIVE  Urine microscopic-add on  Result Value Ref Range   WBC, UA TOO NUMEROUS TO COUNT <3 WBC/hpf   Bacteria, UA MANY (A) RARE   No results found.    EKG Interpretation None      MDM   Final diagnoses:  UTI (lower urinary tract infection)    UA remarkable for 2 minute count white blood cells. Given patient's history of self-catheterization and prior UTI, will treat for UTI. No evidence of systemic infection or urosepsis. Patient is well-appearing. Will order urine culture. Patient is scheduled to follow-up with urology on Monday. I have advised him to keep this  appointment.  1:06 PM Patient seen by and discussed with Dr. Rogene Houston, who agrees with the plan.  Patient noted by be bradycardic, but asymptomatic.  Discussed with Dr. Rogene Houston.  No evidence of heart block on monitor.  Plan for discharge with keflex.  Montine Circle, PA-C 11/04/14 1309

## 2014-11-04 NOTE — ED Provider Notes (Signed)
Medical screening examination/treatment/procedure(s) were conducted as a shared visit with non-physician practitioner(s) and myself.  I personally evaluated the patient during the encounter.   EKG Interpretation None      Patient seen by me. Patient with complaint of urinary tract type symptoms with dysuria. Patient self caths. He's been self cathing for over a year following some prostate problems. Followed by urology. Urinalysis here seems to be consistent with urinary tract infection. Will be treated with antibiotics and follow-up with urology.  Results for orders placed or performed during the hospital encounter of 11/04/14  Urinalysis, Routine w reflex microscopic  Result Value Ref Range   Color, Urine GREEN (A) YELLOW   APPearance CLOUDY (A) CLEAR   Specific Gravity, Urine 1.011 1.005 - 1.030   pH 6.5 5.0 - 8.0   Glucose, UA NEGATIVE NEGATIVE mg/dL   Hgb urine dipstick SMALL (A) NEGATIVE   Bilirubin Urine NEGATIVE NEGATIVE   Ketones, ur NEGATIVE NEGATIVE mg/dL   Protein, ur NEGATIVE NEGATIVE mg/dL   Urobilinogen, UA 0.2 0.0 - 1.0 mg/dL   Nitrite NEGATIVE NEGATIVE   Leukocytes, UA LARGE (A) NEGATIVE  Urine microscopic-add on  Result Value Ref Range   WBC, UA TOO NUMEROUS TO COUNT <3 WBC/hpf   Bacteria, UA MANY (A) RARE     Fredia Sorrow, MD 11/04/14 1248

## 2014-11-06 LAB — URINE CULTURE: Colony Count: >=100000

## 2014-11-07 ENCOUNTER — Telehealth: Payer: Self-pay | Admitting: Emergency Medicine

## 2014-11-07 NOTE — Telephone Encounter (Signed)
Per Domenic Moras PA-C, give patient Levaquin 250 mg PO daily x 7 days.

## 2014-11-08 DIAGNOSIS — N3020 Other chronic cystitis without hematuria: Secondary | ICD-10-CM | POA: Diagnosis not present

## 2014-11-09 ENCOUNTER — Telehealth (HOSPITAL_BASED_OUTPATIENT_CLINIC_OR_DEPARTMENT_OTHER): Payer: Self-pay | Admitting: Emergency Medicine

## 2014-11-11 ENCOUNTER — Telehealth (HOSPITAL_COMMUNITY): Payer: Self-pay

## 2014-11-11 NOTE — Telephone Encounter (Signed)
LVM requesting callback. 3rd call letter sent to [facility] address.

## 2014-11-18 ENCOUNTER — Telehealth (HOSPITAL_BASED_OUTPATIENT_CLINIC_OR_DEPARTMENT_OTHER): Payer: Self-pay | Admitting: Emergency Medicine

## 2014-11-18 NOTE — Telephone Encounter (Signed)
Post ED Visit - Positive Culture Follow-up: Successful Patient Follow-Up  Culture assessed and recommendations reviewed by: []  Wes Foyil, Pharm.D., BCPS []  Heide Guile, Pharm.D., BCPS []  Alycia Rossetti, Pharm.D., BCPS []  [place], Pharm.D., BCPS, AAHIVP []  Legrand Como, Pharm.D., BCPS, AAHIVP []  Hassie Bruce, Pharm.D. []  Milus Glazier, [place].D.  Positive urine culture  []  Patient discharged without antimicrobial prescription and treatment is now indicated [x]  Organism is resistant to prescribed ED discharge antimicrobial []  Patient with positive blood cultures  Changes discussed with ED provider: Colin Rhein New antibiotic prescription Stop keflex, start levaquin 250mg  po daily x 7 days Called to [place]  Contacted patient, date 11/18/14 [place], [place] 11/18/2014, 11:00 AM

## 2014-12-15 ENCOUNTER — Other Ambulatory Visit: Payer: Self-pay | Admitting: *Deleted

## 2014-12-15 ENCOUNTER — Ambulatory Visit (INDEPENDENT_AMBULATORY_CARE_PROVIDER_SITE_OTHER): Payer: Medicare Other | Admitting: Internal Medicine

## 2014-12-15 ENCOUNTER — Encounter: Payer: Self-pay | Admitting: Internal Medicine

## 2014-12-15 VITALS — BP 133/77 | HR 52 | Temp 97.7°F | Ht 71.0 in | Wt 187.3 lb

## 2014-12-15 DIAGNOSIS — I10 Essential (primary) hypertension: Secondary | ICD-10-CM | POA: Diagnosis not present

## 2014-12-15 DIAGNOSIS — K5900 Constipation, unspecified: Secondary | ICD-10-CM | POA: Insufficient documentation

## 2014-12-15 DIAGNOSIS — J309 Allergic rhinitis, unspecified: Secondary | ICD-10-CM | POA: Diagnosis not present

## 2014-12-15 MED ORDER — POLYETHYLENE GLYCOL 3350 17 G PO PACK: 17.0000 g | PACK | Freq: Every day | ORAL | Status: DC

## 2014-12-15 MED ORDER — FEXOFENADINE HCL 180 MG PO TABS: 180.0000 mg | ORAL_TABLET | Freq: Every day | ORAL | Status: DC

## 2014-12-15 MED ORDER — POLYETHYLENE GLYCOL 3350 17 GM/SCOOP PO POWD: 1.0000 | Freq: Once | ORAL | Status: DC

## 2014-12-15 NOTE — Telephone Encounter (Signed)
Sent in as powder not packets. Thanks! SK

## 2014-12-15 NOTE — Progress Notes (Signed)
   Subjective:    Patient ID: Greg [name], male    DOB: May 11, 1941, 74 y.o.   MRN: [id number]  HPI Mr. Kassis is a 74 yr old man with PMH of HTN, atony of bladder with self-cath 1-2 per day (s/p TURP for prostate cancer), sinus bradycardia, and seasonal allergies who presents for evaluation of a "cold" with runny nose for 1 week. He has been on Cetirizine in the past with improvement of his nasal congestion and rhinorrhea. He continues to use Certirizine but with no improvement of his symptoms. He has tried Flonase in the past but did not help him.  He had dysuria last month and was seen at the ED with urine culture growing _serratia marcescens_  Resistant to Cefazolin. He had been prescribed Keflex by the ED provider prior to the susceptibility had returned. The ED Rn tried to contact the patient several times to inform of the need for a different antibiotic but they were never successful in reaching him and he therefore was not switched to another antibiotic.  Today he denies any dysuria or suprapubic tenderness.   Review of Systems  Constitutional: Negative for fever, chills, diaphoresis, activity change, appetite change and fatigue.  HENT: Positive for congestion, postnasal drip, rhinorrhea and sneezing. Negative for ear pain, sinus pressure and sore throat.   Respiratory: Negative for cough and shortness of breath.   Cardiovascular: Negative for chest pain and leg swelling.  Gastrointestinal: Positive for constipation.  Genitourinary: Negative for dysuria.  Skin: Negative for rash.  Neurological: Negative for dizziness and light-headedness.  Psychiatric/Behavioral: Negative for agitation.       Objective:   Physical Exam  Constitutional: He is oriented to person, place, and time. He appears well-developed and well-nourished. No distress.  HENT:  Mouth/Throat: Oropharynx is clear and moist. No oropharyngeal exudate.  Pale boggy nasal turbinates bilaterally with no discharge    Cardiovascular: Normal rate.   Pulmonary/Chest: Effort normal. No respiratory distress.  Neurological: He is alert and oriented to person, place, and time. Coordination normal.  Skin: Skin is warm and dry. He is not diaphoretic.  Psychiatric: He has a normal mood and affect.  Nursing note and vitals reviewed.         Assessment & Plan:

## 2014-12-15 NOTE — Assessment & Plan Note (Signed)
He has runny nose with sneezing, occasional eye itching, post-nasal drip but no cough, fever/chills, body aches. Zyrtec is no longer working for him. He does not want to try Flonase.  -Rx allegra 180mg  daily. He understands this an OTC medication and may be costly but he wants to try it.

## 2014-12-15 NOTE — Telephone Encounter (Addendum)
Call from pharmacy, pt was getting Miralax power in the bottle, order sent yesterday was for "packets"-Pharmacy asking if ok to change rx to bottle.  Approval given over the phone, will send new rx to ordering MD for signature. Despina Hidden Cassady2/24/201611:11 AM

## 2014-12-15 NOTE — Assessment & Plan Note (Signed)
BP Readings from Last 3 Encounters:  12/15/14 133/77  11/04/14 109/68  09/30/14 128/82    Lab Results  Component Value Date   NA 145 07/16/2014   K 4.4 07/16/2014   CREATININE 1.02 07/16/2014    Assessment: Blood pressure control:  controlled Progress toward BP goal:   at goal Comments: He is on HCTZ 25mg  daily  Plan: Medications:  continue current medications Educational resources provided: brochure, handout, video Self management tools provided:   Other plans: Follow up in 6 months.

## 2014-12-15 NOTE — Patient Instructions (Signed)
General Instructions: -Start taking Allegra 180mg  daily for your allergies.  -Take Miralax once daily as needed for constipation.  -Follow up with your colonoscopy appointment.  -Follow up with [place] in 6 months or sooner as needed.   Please bring your medicines with you each time you come to clinic.  Medicines may include prescription medications, over-the-counter medications, herbal remedies, eye drops, vitamins, or other pills.   Progress Toward Treatment Goals:  Treatment Goal 07/22/2014  Blood pressure at goal    Self Care Goals & Plans:  Self Care Goal 12/15/2014  Manage my medications take my medicines as prescribed; bring my medications to every visit; refill my medications on time; follow the sick day instructions if I am sick  Monitor my health keep track of my blood pressure  Eat healthy foods eat more vegetables; eat fruit for snacks and desserts; eat foods that are low in salt; eat baked foods instead of fried foods; eat smaller portions  Be physically active find an activity I enjoy    No flowsheet data found.   Care Management & Community Referrals:  Referral 03/11/2014  Referrals made for care management support none needed

## 2014-12-15 NOTE — Assessment & Plan Note (Signed)
He has a colonoscopy coming up soon but wants to take Miralax as needed again for occasional constipation.  Refilled Miralax

## 2014-12-17 NOTE — Progress Notes (Signed)
INTERNAL MEDICINE TEACHING ATTENDING ADDENDUM - Tema Alire, MD: I reviewed and discussed at the time of visit with the resident Dr. Kennerly, the patient's medical history, physical examination, diagnosis and results of pertinent tests and treatment and I agree with the patient's care as documented.  

## 2015-01-05 DIAGNOSIS — R300 Dysuria: Secondary | ICD-10-CM | POA: Diagnosis not present

## 2015-01-05 DIAGNOSIS — N3020 Other chronic cystitis without hematuria: Secondary | ICD-10-CM | POA: Diagnosis not present

## 2015-01-05 DIAGNOSIS — N312 Flaccid neuropathic bladder, not elsewhere classified: Secondary | ICD-10-CM | POA: Diagnosis not present

## 2015-01-28 ENCOUNTER — Other Ambulatory Visit: Payer: Self-pay | Admitting: *Deleted

## 2015-01-28 MED ORDER — PRAVASTATIN SODIUM 20 MG PO TABS: 20.0000 mg | ORAL_TABLET | Freq: Every day | ORAL | Status: DC

## 2015-02-24 ENCOUNTER — Emergency Department (HOSPITAL_COMMUNITY)
Admission: EM | Admit: 2015-02-24 | Discharge: 2015-02-24 | Disposition: A | Discharge status: Home / Self Care | Payer: Medicare Other | Attending: Emergency Medicine | Admitting: Emergency Medicine

## 2015-02-24 ENCOUNTER — Encounter (HOSPITAL_COMMUNITY): Payer: Self-pay

## 2015-02-24 DIAGNOSIS — Z8619 Personal history of other infectious and parasitic diseases: Secondary | ICD-10-CM | POA: Diagnosis not present

## 2015-02-24 DIAGNOSIS — E785 Hyperlipidemia, unspecified: Secondary | ICD-10-CM | POA: Diagnosis not present

## 2015-02-24 DIAGNOSIS — Z79899 Other long term (current) drug therapy: Secondary | ICD-10-CM | POA: Diagnosis not present

## 2015-02-24 DIAGNOSIS — Z87438 Personal history of other diseases of male genital organs: Secondary | ICD-10-CM | POA: Diagnosis not present

## 2015-02-24 DIAGNOSIS — Z87891 Personal history of nicotine dependence: Secondary | ICD-10-CM | POA: Insufficient documentation

## 2015-02-24 DIAGNOSIS — I10 Essential (primary) hypertension: Secondary | ICD-10-CM | POA: Insufficient documentation

## 2015-02-24 DIAGNOSIS — Z792 Long term (current) use of antibiotics: Secondary | ICD-10-CM | POA: Diagnosis not present

## 2015-02-24 DIAGNOSIS — K5900 Constipation, unspecified: Secondary | ICD-10-CM

## 2015-02-24 DIAGNOSIS — K644 Residual hemorrhoidal skin tags: Secondary | ICD-10-CM | POA: Insufficient documentation

## 2015-02-24 DIAGNOSIS — R109 Unspecified abdominal pain: Secondary | ICD-10-CM

## 2015-02-24 DIAGNOSIS — G8929 Other chronic pain: Secondary | ICD-10-CM | POA: Insufficient documentation

## 2015-02-24 DIAGNOSIS — Z87448 Personal history of other diseases of urinary system: Secondary | ICD-10-CM | POA: Insufficient documentation

## 2015-02-24 NOTE — ED Provider Notes (Signed)
CSN: [id number]     Arrival date & time 02/24/15  1320 History   First MD Initiated Contact with Patient 02/24/15 1333     No chief complaint on file.    (Consider location/radiation/quality/duration/timing/severity/associated sxs/prior Treatment) Patient is a 74 y.o. male presenting with constipation.  Constipation Severity:  Moderate Time since last bowel movement:  1 day Timing:  Constant Progression:  Unchanged Chronicity:  New Context: not medication and not narcotics   Stool description:  Hard, small and pellet like Relieved by:  Nothing Worsened by:  Nothing tried Ineffective treatments: 1 capful miralax daily. Associated symptoms: abdominal pain (crampy, mild)   Associated symptoms: no anorexia, no back pain, no diarrhea, no dysuria, no fever, no hematochezia, no nausea, no urinary retention and no vomiting     Past Medical History  Diagnosis Date  . Hyperlipidemia   . Hypertension   . Epididymitis     right  . Atony of bladder     followed at [facility]; CIC 3x/day, has been treated for culture proven serratia marcesens 05/2011, 06/2011, 07/2011, 09/2011  . [name] (benign prostatic hyperplasia)     hyposensitive bladder, nodular prostate with increased PSA (10.23 in 03/2006), s/p prostate biopsy 2006 that was normal (Dr Jeffie Pollock) he recommended TURP.  . Sinus bradycardia   . Chronic knee pain     left  . Gingivitis   . Right shoulder pain     sx in 06/2005  . Frozen shoulder     left frozen shoulder  . Shingles    Past Surgical History  Procedure Laterality Date  . Vasectomy    . Hydrocelectomy  11/14/2010    left hydrocelectomy  . Right shoulder arthroscopic surgery  06/2005  . Transurethral resection of prostate      status post TURP 2/2 [name] in 2008  . Knee surgery      L knee   History reviewed. No pertinent family history. History  Substance Use Topics  . Smoking status: Former Smoker    Types: Cigarettes    Quit date: 02/07/1988  . Smokeless  tobacco: Not on file  . Alcohol Use: No    Review of Systems  Constitutional: Negative for fever.  Gastrointestinal: Positive for abdominal pain (crampy, mild) and constipation. Negative for nausea, vomiting, diarrhea, hematochezia and anorexia.  Genitourinary: Negative for dysuria.  Musculoskeletal: Negative for back pain.  All other systems reviewed and are negative.     Allergies  Ciprofloxacin hcl and Ibuprofen  Home Medications   Prior to Admission medications   Medication Sig Start Date End Date Taking? Authorizing Provider  cetirizine (ZYRTEC) 10 MG tablet Take 1 tablet (10 mg total) by mouth daily. 09/30/14  Yes Ejiroghene E Emokpae, MD  hydrochlorothiazide (HYDRODIURIL) 25 MG tablet Take 1 tablet (25 mg total) by mouth daily. 09/30/14  Yes Ejiroghene E Emokpae, MD  polyethylene glycol powder (GLYCOLAX/MIRALAX) powder Take 255 g by mouth once. Patient taking differently: Take 1 Container by mouth daily.  12/15/14  Yes Blain Pais, MD  pravastatin (PRAVACHOL) 20 MG tablet Take 1 tablet (20 mg total) by mouth daily. 01/28/15  Yes Norman Herrlich, MD  cephALEXin (KEFLEX) 500 MG capsule Take 1 capsule (500 mg total) by mouth 4 (four) times daily. Patient not taking: Reported on 02/24/2015 11/04/14   Montine Circle, PA-C  fexofenadine (ALLEGRA) 180 MG tablet Take 1 tablet (180 mg total) by mouth daily. Patient not taking: Reported on 02/24/2015 12/15/14   Blain Pais, MD  phenylephrine-shark liver oil-mineral oil-petrolatum (PREPARATION H) 0.25-3-14-71.9 % rectal ointment Place 1 application rectally 2 (two) times daily as needed for hemorrhoids. Patient not taking: Reported on 02/24/2015 06/07/14   Juluis Mire, MD  sulfamethoxazole-trimethoprim (BACTRIM DS,SEPTRA DS) 800-160 MG per tablet Take 1 tablet by mouth 2 (two) times daily. Hasn't started yet. 02/05/15   Historical Provider, MD   BP 112/77 mmHg  Pulse 56  Temp(Src) 98.1 F (36.7 C) (Oral)  Resp 16  Ht 5\' 11"   (1.803 m)  Wt 186 lb (84.369 kg)  BMI 25.95 kg/m2  SpO2 100% Physical Exam  Constitutional: He is oriented to person, place, and time. He appears well-developed and well-nourished.  HENT:  Head: Normocephalic and atraumatic.  Eyes: Conjunctivae and EOM are normal.  Neck: Normal range of motion. Neck supple.  Cardiovascular: Normal rate, regular rhythm and normal heart sounds.   Pulmonary/Chest: Effort normal and breath sounds normal. No respiratory distress.  Abdominal: He exhibits no distension. There is no tenderness. There is no rebound and no guarding.  Genitourinary: Rectal exam shows external hemorrhoid. Rectal exam shows no internal hemorrhoid.  Minimal stool in rectal vault  Musculoskeletal: Normal range of motion.  Neurological: He is alert and oriented to person, place, and time.  Skin: Skin is warm and dry.  Vitals reviewed.   ED Course  Procedures (including critical care time) Labs Review Labs Reviewed - No data to display  Imaging Review No results found.   EKG Interpretation None      MDM   Final diagnoses:  Abdominal pain, acute  Constipation, unspecified constipation type    74 y.o. male with pertinent PMH of HTN, HLD presents with recurrent constipation.  No signs of SBO on exam.  No impaction.  Given standard return precautions and instruction to take larger amount of miralax (6-7 capfuls tomorrow).  DC home in stable condition.    I have reviewed all laboratory and imaging studies if ordered as above  1. Abdominal pain, acute   2. Constipation, unspecified constipation type         Debby Freiberg, MD 02/26/15 2333

## 2015-02-24 NOTE — ED Notes (Signed)
Pt presents with 4 day h/o constipation. Pt reports having "really small" bowel movements, reports onset of lower abdominal pain that began this morning.  Pt denies n/v

## 2015-02-24 NOTE — Discharge Instructions (Signed)
You can take 6-7 capfuls in ~24 ounces of liquid one time as needed to help with acute constipation.  If this continues, see your primary care doctor and GI doctor. Constipation Constipation is when a person has fewer than three bowel movements a week, has difficulty having a bowel movement, or has stools that are dry, hard, or larger than normal. As people grow older, constipation is more common. If you try to fix constipation with medicines that make you have a bowel movement (laxatives), the problem may get worse. Long-term laxative use may cause the muscles of the colon to become weak. A low-fiber diet, not taking in enough fluids, and taking certain medicines may make constipation worse.  CAUSES   Certain medicines, such as antidepressants, pain medicine, iron supplements, antacids, and water pills.   Certain diseases, such as diabetes, irritable bowel syndrome (IBS), thyroid disease, or depression.   Not drinking enough water.   Not eating enough fiber-rich foods.   Stress or travel.   Lack of physical activity or exercise.   Ignoring the urge to have a bowel movement.   Using laxatives too much.  SIGNS AND SYMPTOMS   Having fewer than three bowel movements a week.   Straining to have a bowel movement.   Having stools that are hard, dry, or larger than normal.   Feeling full or bloated.   Pain in the lower abdomen.   Not feeling relief after having a bowel movement.  DIAGNOSIS  Your health care provider will take a medical history and perform a physical exam. Further testing may be done for severe constipation. Some tests may include:  A barium enema X-ray to examine your rectum, colon, and, sometimes, your small intestine.   A sigmoidoscopy to examine your lower colon.   A colonoscopy to examine your entire colon. TREATMENT  Treatment will depend on the severity of your constipation and what is causing it. Some dietary treatments include drinking more  fluids and eating more fiber-rich foods. Lifestyle treatments may include regular exercise. If these diet and lifestyle recommendations do not help, your health care provider may recommend taking over-the-counter laxative medicines to help you have bowel movements. Prescription medicines may be prescribed if over-the-counter medicines do not work.  HOME CARE INSTRUCTIONS   Eat foods that have a lot of fiber, such as fruits, vegetables, whole grains, and beans.  Limit foods high in fat and processed sugars, such as french fries, hamburgers, cookies, candies, and soda.   A fiber supplement may be added to your diet if you cannot get enough fiber from foods.   Drink enough fluids to keep your urine clear or pale yellow.   Exercise regularly or as directed by your health care provider.   Go to the restroom when you have the urge to go. Do not hold it.   Only take over-the-counter or prescription medicines as directed by your health care provider. Do not take other medicines for constipation without talking to your health care provider first.  [place] IF:   You have bright red blood in your stool.   Your constipation lasts for more than 4 days or gets worse.   You have abdominal or rectal pain.   You have thin, pencil-like stools.   You have unexplained weight loss. MAKE SURE YOU:   Understand these instructions.  Will watch your condition.  Will get help right away if you are not doing well or get worse. Document Released: 07/06/2004 Document Revised: 10/13/2013  Document Reviewed: 07/20/2013 [facility] Patient Information 2015 [place], [place]. This information is not intended to replace advice given to you by your health care provider. Make sure you discuss any questions you have with your health care provider.

## 2015-02-27 ENCOUNTER — Emergency Department (HOSPITAL_COMMUNITY)
Admission: EM | Admit: 2015-02-27 | Discharge: 2015-02-28 | Disposition: A | Discharge status: Home / Self Care | Payer: Medicare Other | Attending: Emergency Medicine | Admitting: Emergency Medicine

## 2015-02-27 ENCOUNTER — Emergency Department (HOSPITAL_COMMUNITY): Payer: Medicare Other

## 2015-02-27 ENCOUNTER — Encounter (HOSPITAL_COMMUNITY): Payer: Self-pay | Admitting: Emergency Medicine

## 2015-02-27 DIAGNOSIS — G8929 Other chronic pain: Secondary | ICD-10-CM | POA: Insufficient documentation

## 2015-02-27 DIAGNOSIS — Z87438 Personal history of other diseases of male genital organs: Secondary | ICD-10-CM | POA: Diagnosis not present

## 2015-02-27 DIAGNOSIS — R1030 Lower abdominal pain, unspecified: Secondary | ICD-10-CM | POA: Diagnosis present

## 2015-02-27 DIAGNOSIS — Z8719 Personal history of other diseases of the digestive system: Secondary | ICD-10-CM | POA: Diagnosis not present

## 2015-02-27 DIAGNOSIS — Z8619 Personal history of other infectious and parasitic diseases: Secondary | ICD-10-CM | POA: Insufficient documentation

## 2015-02-27 DIAGNOSIS — Z79899 Other long term (current) drug therapy: Secondary | ICD-10-CM | POA: Insufficient documentation

## 2015-02-27 DIAGNOSIS — I10 Essential (primary) hypertension: Secondary | ICD-10-CM | POA: Insufficient documentation

## 2015-02-27 DIAGNOSIS — Z9889 Other specified postprocedural states: Secondary | ICD-10-CM | POA: Diagnosis not present

## 2015-02-27 DIAGNOSIS — K5900 Constipation, unspecified: Secondary | ICD-10-CM | POA: Diagnosis not present

## 2015-02-27 DIAGNOSIS — Z87891 Personal history of nicotine dependence: Secondary | ICD-10-CM | POA: Diagnosis not present

## 2015-02-27 DIAGNOSIS — N400 Enlarged prostate without lower urinary tract symptoms: Secondary | ICD-10-CM | POA: Diagnosis not present

## 2015-02-27 DIAGNOSIS — E785 Hyperlipidemia, unspecified: Secondary | ICD-10-CM | POA: Insufficient documentation

## 2015-02-27 DIAGNOSIS — K639 Disease of intestine, unspecified: Secondary | ICD-10-CM | POA: Diagnosis not present

## 2015-02-27 DIAGNOSIS — K6389 Other specified diseases of intestine: Secondary | ICD-10-CM | POA: Insufficient documentation

## 2015-02-27 LAB — CBC WITH DIFFERENTIAL/PLATELET
BASOS PCT: 1 % (ref 0–1)
Basophils Absolute: 0 10*3/uL (ref 0.0–0.1)
Eosinophils Absolute: 0.1 10*3/uL (ref 0.0–0.7)
Eosinophils Relative: 2 % (ref 0–5)
HCT: 43.3 % (ref 39.0–52.0)
Hemoglobin: 14.7 g/dL (ref 13.0–17.0)
Lymphocytes Relative: 23 % (ref 12–46)
Lymphs Abs: 1.7 10*3/uL (ref 0.7–4.0)
MCH: 31.1 pg (ref 26.0–34.0)
MCHC: 33.9 g/dL (ref 30.0–36.0)
MCV: 91.7 fL (ref 78.0–100.0)
MONO ABS: 0.7 10*3/uL (ref 0.1–1.0)
Monocytes Relative: 9 % (ref 3–12)
NEUTROS PCT: 65 % (ref 43–77)
Neutro Abs: 4.9 10*3/uL (ref 1.7–7.7)
Platelets: 330 10*3/uL (ref 150–400)
RBC: 4.72 MIL/uL (ref 4.22–5.81)
RDW: 13.3 % (ref 11.5–15.5)
WBC: 7.4 10*3/uL (ref 4.0–10.5)

## 2015-02-27 LAB — COMPREHENSIVE METABOLIC PANEL
ALK PHOS: 65 U/L (ref 38–126)
ALT: 11 U/L — AB (ref 17–63)
ANION GAP: 8 (ref 5–15)
AST: 23 U/L (ref 15–41)
Albumin: 3.4 g/dL — ABNORMAL LOW (ref 3.5–5.0)
BILIRUBIN TOTAL: 0.6 mg/dL (ref 0.3–1.2)
BUN: 12 mg/dL (ref 6–20)
CHLORIDE: 98 mmol/L — AB (ref 101–111)
CO2: 26 mmol/L (ref 22–32)
Calcium: 8.6 mg/dL — ABNORMAL LOW (ref 8.9–10.3)
Creatinine, Ser: 1.19 mg/dL (ref 0.61–1.24)
GFR, EST NON AFRICAN AMERICAN: 58 mL/min — AB (ref >60)
GLUCOSE: 132 mg/dL — AB (ref 70–99)
POTASSIUM: 3.2 mmol/L — AB (ref 3.5–5.1)
SODIUM: 132 mmol/L — AB (ref 135–145)
Total Protein: 7.2 g/dL (ref 6.5–8.1)

## 2015-02-27 LAB — LIPASE, BLOOD: LIPASE: 26 U/L (ref 22–51)

## 2015-02-27 LAB — POC OCCULT BLOOD, ED: Fecal Occult Bld: NEGATIVE

## 2015-02-27 MED ORDER — IOHEXOL 300 MG/ML  SOLN
80.0000 mL | Freq: Once | INTRAMUSCULAR | Status: AC | PRN
  Administered 2015-02-27: 100 mL via INTRAVENOUS

## 2015-02-27 MED ORDER — IOHEXOL 300 MG/ML  SOLN
25.0000 mL | INTRAMUSCULAR | Status: AC
  Administered 2015-02-27: 25 mL via ORAL

## 2015-02-27 NOTE — ED Notes (Signed)
Pt. reports low abdominal pain onset last night , denies emesis, dysuria  or diarrhea. No fever or chills.

## 2015-02-27 NOTE — ED Notes (Addendum)
Patient unable to void. Bladder scan 42 mL

## 2015-02-27 NOTE — ED Provider Notes (Addendum)
CSN: [id number]     Arrival date & time 02/27/15  1949 History   First MD Initiated Contact with Patient 02/27/15 2039     Chief Complaint  Patient presents with  . Abdominal Pain     (Consider location/radiation/quality/duration/timing/severity/associated sxs/prior Treatment) HPI Comments: Patient presents with a dominant pain. He states he's had a 2 to three-day history of some pain across his lower abdomen. It was worse this morning and throughout today. He denies any nausea or vomiting. He denies any fevers. He denies any urinary symptoms. 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  He was seen here 3 days ago for constipation and is been using marijuana. He states he is having better bowel movements. He feels like he did see some blood in his stool but denies any melena. He denies any history of similar pain in the past.  Patient is a 74 y.o. male presenting with abdominal pain.  Abdominal Pain Associated symptoms: constipation   Associated symptoms: no chest pain, no chills, no cough, no diarrhea, no fatigue, no fever, no hematuria, no nausea, no shortness of breath and no vomiting     Past Medical History  Diagnosis Date  . Hyperlipidemia   . Hypertension   . Epididymitis     right  . Atony of bladder     followed at [facility]; CIC 3x/day, has been treated for culture proven serratia marcesens 05/2011, 06/2011, 07/2011, 09/2011  . [name] (benign prostatic hyperplasia)     hyposensitive bladder, nodular prostate with increased PSA (10.23 in 03/2006), s/p prostate biopsy 2006 that was normal (Dr Jeffie Pollock) he recommended TURP.  . Sinus bradycardia   . Chronic knee pain     left  . Gingivitis   . Right shoulder pain     sx in 06/2005  . Frozen shoulder     left frozen shoulder  . Shingles    Past Surgical History  Procedure Laterality Date  . Vasectomy    . Hydrocelectomy  11/14/2010    left hydrocelectomy  . Right shoulder  arthroscopic surgery  06/2005  . Transurethral resection of prostate      status post TURP 2/2 [name] in 2008  . Knee surgery      L knee   No family history on file. History  Substance Use Topics  . Smoking status: Former Smoker    Types: Cigarettes    Quit date: 02/07/1988  . Smokeless tobacco: Not on file  . Alcohol Use: No    Review of Systems  Constitutional: Negative for fever, chills, diaphoresis and fatigue.  HENT: Negative for congestion, rhinorrhea and sneezing.   Eyes: Negative.   Respiratory: Negative for cough, chest tightness and shortness of breath.   Cardiovascular: Negative for chest pain and leg swelling.  Gastrointestinal: Positive for abdominal pain and constipation. Negative for nausea, vomiting, diarrhea and blood in stool.  Genitourinary: Negative for frequency, hematuria, flank pain and difficulty urinating.  Musculoskeletal: Negative for back pain and arthralgias.  Skin: Negative for rash.  Neurological: Negative for dizziness, speech difficulty, weakness, numbness and headaches.      Allergies  Ciprofloxacin hcl and Ibuprofen  Home Medications   Prior to Admission medications   Medication Sig Start Date End Date Taking? Authorizing Provider  cephALEXin (KEFLEX) 500 MG capsule Take 1 capsule (500 mg total) by mouth 4 (four) times daily. Patient not taking: Reported on 02/24/2015 11/04/14   Montine Circle, PA-C  cetirizine (ZYRTEC) 10  MG tablet Take 1 tablet (10 mg total) by mouth daily. 09/30/14   Ejiroghene Arlyce Dice, MD  fexofenadine (ALLEGRA) 180 MG tablet Take 1 tablet (180 mg total) by mouth daily. Patient not taking: Reported on 02/24/2015 12/15/14   Blain Pais, MD  hydrochlorothiazide (HYDRODIURIL) 25 MG tablet Take 1 tablet (25 mg total) by mouth daily. 09/30/14   Ejiroghene Arlyce Dice, MD  phenylephrine-shark liver oil-mineral oil-petrolatum (PREPARATION H) 0.25-3-14-71.9 % rectal ointment Place 1 application rectally 2 (two) times daily  as needed for hemorrhoids. Patient not taking: Reported on 02/24/2015 06/07/14   Juluis Mire, MD  polyethylene glycol powder (GLYCOLAX/MIRALAX) powder Take 255 g by mouth once. Patient taking differently: Take 1 Container by mouth daily.  12/15/14   Blain Pais, MD  pravastatin (PRAVACHOL) 20 MG tablet Take 1 tablet (20 mg total) by mouth daily. 01/28/15   Norman Herrlich, MD  sulfamethoxazole-trimethoprim (BACTRIM DS,SEPTRA DS) 800-160 MG per tablet Take 1 tablet by mouth 2 (two) times daily. Hasn't started yet. 02/05/15   Historical Provider, MD   BP 130/72 mmHg  Pulse 63  Temp(Src) 99 F (37.2 C) (Oral)  Resp 14  Ht 5\' 11"  (1.803 m)  Wt 182 lb (82.555 kg)  BMI 25.40 kg/m2  SpO2 96% Physical Exam  Constitutional: He is oriented to person, place, and time. He appears well-developed and well-nourished.  HENT:  Head: Normocephalic and atraumatic.  Eyes: Pupils are equal, round, and reactive to light.  Neck: Normal range of motion. Neck supple.  Cardiovascular: Normal rate, regular rhythm and normal heart sounds.   Pulmonary/Chest: Effort normal and breath sounds normal. No respiratory distress. He has no wheezes. He has no rales. He exhibits no tenderness.  Abdominal: Soft. Bowel sounds are normal. There is tenderness (moderate TTP across lower abdomen). There is no rebound and no guarding.  Musculoskeletal: Normal range of motion. He exhibits no edema.  Lymphadenopathy:    He has no cervical adenopathy.  Neurological: He is alert and oriented to person, place, and time.  Skin: Skin is warm and dry. No rash noted.  Psychiatric: He has a normal mood and affect.    ED Course  Procedures (including critical care time) Labs Review Results for orders placed or performed during the hospital encounter of 02/27/15  CBC with Differential  Result Value Ref Range   WBC 7.4 4.0 - 10.5 K/uL   RBC 4.72 4.22 - 5.81 MIL/uL   Hemoglobin 14.7 13.0 - 17.0 g/dL   HCT 43.3 39.0 - 52.0 %   MCV  91.7 78.0 - 100.0 fL   MCH 31.1 26.0 - 34.0 pg   MCHC 33.9 30.0 - 36.0 g/dL   RDW 13.3 11.5 - 15.5 %   Platelets 330 150 - 400 K/uL   Neutrophils Relative % 65 43 - 77 %   Neutro Abs 4.9 1.7 - 7.7 K/uL   Lymphocytes Relative 23 12 - 46 %   Lymphs Abs 1.7 0.7 - 4.0 K/uL   Monocytes Relative 9 3 - 12 %   Monocytes Absolute 0.7 0.1 - 1.0 K/uL   Eosinophils Relative 2 0 - 5 %   Eosinophils Absolute 0.1 0.0 - 0.7 K/uL   Basophils Relative 1 0 - 1 %   Basophils Absolute 0.0 0.0 - 0.1 K/uL  Comprehensive metabolic panel  Result Value Ref Range   Sodium 132 (L) 135 - 145 mmol/L   Potassium 3.2 (L) 3.5 - 5.1 mmol/L   Chloride 98 (L) 101 -  111 mmol/L   CO2 26 22 - 32 mmol/L   Glucose, Bld 132 (H) 70 - 99 mg/dL   BUN 12 6 - 20 mg/dL   Creatinine, Ser 1.19 0.61 - 1.24 mg/dL   Calcium 8.6 (L) 8.9 - 10.3 mg/dL   Total Protein 7.2 6.5 - 8.1 g/dL   Albumin 3.4 (L) 3.5 - 5.0 g/dL   AST 23 15 - 41 U/L   ALT 11 (L) 17 - 63 U/L   Alkaline Phosphatase 65 38 - 126 U/L   Total Bilirubin 0.6 0.3 - 1.2 mg/dL   GFR calc non Af Amer 58 (L) >60 mL/min   GFR calc Af Amer >60 >60 mL/min   Anion gap 8 5 - 15  Lipase, blood  Result Value Ref Range   Lipase 26 22 - 51 U/L  POC occult blood, ED RN will collect  Result Value Ref Range   Fecal Occult Bld NEGATIVE NEGATIVE   Dg Abd Acute W/chest  02/27/2015   CLINICAL DATA:  Hypogastric abdominal pain for 3 days with constipation.  EXAM: DG ABDOMEN ACUTE W/ 1V CHEST  COMPARISON:  CT abdomen and pelvis 07/16/2014  FINDINGS: Shallow inspiration. Mild cardiac enlargement without vascular congestion. Infiltration or atelectasis in both lung bases. No blunting of costophrenic angles. No pneumothorax. Calcified and tortuous aorta. Degenerative changes in the shoulders.  Moderate gaseous distention of the colon and some small bowel loops. This could indicate either colonic obstruction or ileus. A few air-fluid levels are demonstrated in the colon. No free  intraperitoneal air. Degenerative changes in the spine and hips.  IMPRESSION: Nonspecific moderate gaseous distention of colon and some small bowel. Findings suggest colonic obstruction or ileus. Infiltration or atelectasis noted in both lung bases with mild cardiac enlargement.   Electronically Signed   By: Lucienne Capers M.D.   On: 02/27/2015 21:52      Imaging Review Dg Abd Acute W/chest  02/27/2015   CLINICAL DATA:  Hypogastric abdominal pain for 3 days with constipation.  EXAM: DG ABDOMEN ACUTE W/ 1V CHEST  COMPARISON:  CT abdomen and pelvis 07/16/2014  FINDINGS: Shallow inspiration. Mild cardiac enlargement without vascular congestion. Infiltration or atelectasis in both lung bases. No blunting of costophrenic angles. No pneumothorax. Calcified and tortuous aorta. Degenerative changes in the shoulders.  Moderate gaseous distention of the colon and some small bowel loops. This could indicate either colonic obstruction or ileus. A few air-fluid levels are demonstrated in the colon. No free intraperitoneal air. Degenerative changes in the spine and hips.  IMPRESSION: Nonspecific moderate gaseous distention of colon and some small bowel. Findings suggest colonic obstruction or ileus. Infiltration or atelectasis noted in both lung bases with mild cardiac enlargement.   Electronically Signed   By: Lucienne Capers M.D.   On: 02/27/2015 21:52     EKG Interpretation None      MDM   Final diagnoses:  Lower abdominal pain    Patient presents with pain across his lower abdomen. His x-ray shows a possible ileus versus obstruction. CT scan does not show evidence of obstruction. There is no evidence of ileus. There is an annular lesion in his colon that was present on prior CTs but looks a little more prominent. I discussed these findings with the patient and he says he did get a colonoscopy following his last CT scan but that it was indeterminate given that his colon wasn't cleaned out well enough. He  was posterior be schedule a colonoscopy but he did  not do that. I discussed with him the importance of scheduling a colonoscopy with Dr. Paulita Fujita and he understands this. He currently denies any abdominal pain. I advised him to continue the Mira lax at home. Currently awaiting his urinalysis. If his urine is unremarkable until he can be discharged home. Dr. Claudine Mouton to follow-up on the urine result and discharged following that.      Malvin Johns, MD 02/27/15 [id number]  Malvin Johns, MD 02/28/15 0354

## 2015-02-28 DIAGNOSIS — K5900 Constipation, unspecified: Secondary | ICD-10-CM | POA: Diagnosis not present

## 2015-02-28 LAB — URINE MICROSCOPIC-ADD ON

## 2015-02-28 LAB — URINALYSIS, ROUTINE W REFLEX MICROSCOPIC
BILIRUBIN URINE: NEGATIVE
GLUCOSE, UA: NEGATIVE mg/dL
Ketones, ur: NEGATIVE mg/dL
NITRITE: NEGATIVE
PH: 6 (ref 5.0–8.0)
Protein, ur: NEGATIVE mg/dL
SPECIFIC GRAVITY, URINE: 1.01 (ref 1.005–1.030)
Urobilinogen, UA: 0.2 mg/dL (ref 0.0–1.0)

## 2015-02-28 MED ORDER — SODIUM CHLORIDE 0.9 % IV BOLUS (SEPSIS)
1000.0000 mL | Freq: Once | INTRAVENOUS | Status: AC
  Administered 2015-02-28: 1000 mL via INTRAVENOUS

## 2015-02-28 NOTE — Discharge Instructions (Signed)

## 2015-03-09 DIAGNOSIS — K625 Hemorrhage of anus and rectum: Secondary | ICD-10-CM | POA: Diagnosis not present

## 2015-03-09 DIAGNOSIS — R1030 Lower abdominal pain, unspecified: Secondary | ICD-10-CM | POA: Diagnosis not present

## 2015-03-09 DIAGNOSIS — R933 Abnormal findings on diagnostic imaging of other parts of digestive tract: Secondary | ICD-10-CM | POA: Diagnosis not present

## 2015-03-09 DIAGNOSIS — K5900 Constipation, unspecified: Secondary | ICD-10-CM | POA: Diagnosis not present

## 2015-03-11 ENCOUNTER — Emergency Department (HOSPITAL_COMMUNITY): Payer: Medicare Other

## 2015-03-11 ENCOUNTER — Encounter (HOSPITAL_COMMUNITY): Payer: Self-pay | Admitting: Neurology

## 2015-03-11 ENCOUNTER — Inpatient Hospital Stay (HOSPITAL_COMMUNITY)
Admission: EM | Admit: 2015-03-11 | Discharge: 2015-03-19 | DRG: 330 | Disposition: A | Discharge status: Home Health | Payer: Medicare Other | Attending: Internal Medicine | Admitting: Internal Medicine

## 2015-03-11 DIAGNOSIS — K5660 Unspecified intestinal obstruction: Secondary | ICD-10-CM | POA: Diagnosis not present

## 2015-03-11 DIAGNOSIS — C187 Malignant neoplasm of sigmoid colon: Principal | ICD-10-CM | POA: Diagnosis present

## 2015-03-11 DIAGNOSIS — K593 Megacolon, not elsewhere classified: Secondary | ICD-10-CM | POA: Diagnosis present

## 2015-03-11 DIAGNOSIS — E876 Hypokalemia: Secondary | ICD-10-CM | POA: Diagnosis not present

## 2015-03-11 DIAGNOSIS — Z87891 Personal history of nicotine dependence: Secondary | ICD-10-CM

## 2015-03-11 DIAGNOSIS — R1032 Left lower quadrant pain: Secondary | ICD-10-CM | POA: Diagnosis not present

## 2015-03-11 DIAGNOSIS — N312 Flaccid neuropathic bladder, not elsewhere classified: Secondary | ICD-10-CM | POA: Diagnosis present

## 2015-03-11 DIAGNOSIS — M7989 Other specified soft tissue disorders: Secondary | ICD-10-CM | POA: Diagnosis not present

## 2015-03-11 DIAGNOSIS — R1030 Lower abdominal pain, unspecified: Secondary | ICD-10-CM

## 2015-03-11 DIAGNOSIS — K5909 Other constipation: Secondary | ICD-10-CM | POA: Diagnosis present

## 2015-03-11 DIAGNOSIS — K56609 Unspecified intestinal obstruction, unspecified as to partial versus complete obstruction: Secondary | ICD-10-CM

## 2015-03-11 DIAGNOSIS — K598 Other specified functional intestinal disorders: Secondary | ICD-10-CM | POA: Diagnosis not present

## 2015-03-11 DIAGNOSIS — K921 Melena: Secondary | ICD-10-CM | POA: Diagnosis present

## 2015-03-11 DIAGNOSIS — K5900 Constipation, unspecified: Secondary | ICD-10-CM | POA: Diagnosis not present

## 2015-03-11 DIAGNOSIS — Z85038 Personal history of other malignant neoplasm of large intestine: Secondary | ICD-10-CM

## 2015-03-11 DIAGNOSIS — K6389 Other specified diseases of intestine: Secondary | ICD-10-CM | POA: Diagnosis present

## 2015-03-11 DIAGNOSIS — R1012 Left upper quadrant pain: Secondary | ICD-10-CM | POA: Diagnosis not present

## 2015-03-11 DIAGNOSIS — I10 Essential (primary) hypertension: Secondary | ICD-10-CM | POA: Diagnosis not present

## 2015-03-11 DIAGNOSIS — E785 Hyperlipidemia, unspecified: Secondary | ICD-10-CM | POA: Diagnosis present

## 2015-03-11 LAB — CBC WITH DIFFERENTIAL/PLATELET
Basophils Absolute: 0 10*3/uL (ref 0.0–0.1)
Basophils Relative: 0 % (ref 0–1)
Eosinophils Absolute: 0 10*3/uL (ref 0.0–0.7)
Eosinophils Relative: 0 % (ref 0–5)
HEMATOCRIT: 45.8 % (ref 39.0–52.0)
HEMOGLOBIN: 15.2 g/dL (ref 13.0–17.0)
LYMPHS PCT: 7 % — AB (ref 12–46)
Lymphs Abs: 0.7 10*3/uL (ref 0.7–4.0)
MCH: 30.9 pg (ref 26.0–34.0)
MCHC: 33.2 g/dL (ref 30.0–36.0)
MCV: 93.1 fL (ref 78.0–100.0)
MONO ABS: 0.7 10*3/uL (ref 0.1–1.0)
Monocytes Relative: 8 % (ref 3–12)
NEUTROS ABS: 8.4 10*3/uL — AB (ref 1.7–7.7)
Neutrophils Relative %: 85 % — ABNORMAL HIGH (ref 43–77)
Platelets: 335 10*3/uL (ref 150–400)
RBC: 4.92 MIL/uL (ref 4.22–5.81)
RDW: 13.6 % (ref 11.5–15.5)
WBC: 9.8 10*3/uL (ref 4.0–10.5)

## 2015-03-11 LAB — BASIC METABOLIC PANEL
Anion gap: 18 — ABNORMAL HIGH (ref 5–15)
BUN: 15 mg/dL (ref 6–20)
CALCIUM: 8.8 mg/dL — AB (ref 8.9–10.3)
CO2: 22 mmol/L (ref 22–32)
CREATININE: 1.06 mg/dL (ref 0.61–1.24)
Chloride: 95 mmol/L — ABNORMAL LOW (ref 101–111)
GFR calc Af Amer: >60 mL/min (ref >60)
GLUCOSE: 78 mg/dL (ref 65–99)
Potassium: 3 mmol/L — ABNORMAL LOW (ref 3.5–5.1)
Sodium: 135 mmol/L (ref 135–145)

## 2015-03-11 LAB — I-STAT CHEM 8, ED
BUN: 17 mg/dL (ref 6–20)
CHLORIDE: 95 mmol/L — AB (ref 101–111)
Calcium, Ion: 1.06 mmol/L — ABNORMAL LOW (ref 1.13–1.30)
Creatinine, Ser: 1 mg/dL (ref 0.61–1.24)
Glucose, Bld: 117 mg/dL — ABNORMAL HIGH (ref 65–99)
HEMATOCRIT: 52 % (ref 39.0–52.0)
Hemoglobin: 17.7 g/dL — ABNORMAL HIGH (ref 13.0–17.0)
Potassium: 2.8 mmol/L — ABNORMAL LOW (ref 3.5–5.1)
SODIUM: 136 mmol/L (ref 135–145)
TCO2: 23 mmol/L (ref 0–100)

## 2015-03-11 MED ORDER — IOHEXOL 300 MG/ML  SOLN
100.0000 mL | Freq: Once | INTRAMUSCULAR | Status: AC | PRN
  Administered 2015-03-11: 100 mL via INTRAVENOUS

## 2015-03-11 MED ORDER — IOHEXOL 300 MG/ML  SOLN
25.0000 mL | Freq: Once | INTRAMUSCULAR | Status: AC | PRN
  Administered 2015-03-11: 25 mL via ORAL

## 2015-03-11 MED ORDER — POTASSIUM CHLORIDE 10 MEQ/100ML IV SOLN
10.0000 meq | Freq: Once | INTRAVENOUS | Status: AC
  Administered 2015-03-11: 10 meq via INTRAVENOUS
  Filled 2015-03-11: qty 100

## 2015-03-11 NOTE — ED Provider Notes (Signed)
Complains of abdominal pain, diffuse onset 4 days ago intermittent last 5 seconds at a time. No pain at present. He is presently hungry. He states he vomited a slight amount of clear phlegm this morning. He is presently hungry. His last bowel movement was yesterday. States he feels somewhat constipated. He is treated himself with MiraLAX "not enough came out" on exam alert no distress abdomen soft normal active bowel sounds nondistended nontender genitalia normal male  Greg Dakin, MD 03/11/15 [phone]

## 2015-03-11 NOTE — ED Notes (Signed)
Patient transported to XR. 

## 2015-03-11 NOTE — ED Notes (Signed)
Returned from CT.

## 2015-03-11 NOTE — ED Notes (Signed)
Pt reports constipation, last BM several days ago but had small one last night.

## 2015-03-11 NOTE — H&P (Signed)
Date: 03/12/2015               Patient Name:  Greg [name] MRN: [id number]  DOB: 17-Sep-1941 Age / Sex: 74 y.o., male   PCP: Norman Herrlich, MD         Medical Service: Internal Medicine Teaching Service         Attending Physician: Dr. Orlie Dakin, MD    First Contact: Reynaldo Minium Pager:   Second Contact: Dr. Natasha Bence Pager: [phone]       After Hours (After 5p/  First Contact Pager: [phone]  weekends / holidays): Second Contact Pager: [phone]   Chief Complaint: obstipation and constipation.  History of Present Illness: Mr Purdy is a 74 year old man with HTN, atrophic bladder, chronic sinus bradycardia who presents with constipation and obstipation. He was seen in the ED 02/27/15 for abdominal pain with x-ray showing possible obstruction but CT scan negative for obstruction or ileus but it did indicate an annular constricting lesion in the sigmoid. He was told to follow-up with GI (Dr Paulita Fujita of [facility] [place]) which he has not yet done. He has since had abdominal pain. He describes it as going across the bottom of his abdomen but not radiating. It is intermittent. He thinks eating makes it worse. He has had decreased po intake over this time but does not note any weight loss. He says he has been constipated for a few months on and off but he had a small BM last night. He notes he last passed gas yesterday. He says he had some hematachezia a few days ago and has also had episodes of melena. He said yesterday he had an episode of emesis but it sounds more like spitting up. He went to get colonoscopy several years ago but the prep was bad so he has never had a colonoscopy. He is unaware of any family history of cancer.  In the ED, he had abdominal x-ray with dilated small bowel and proximal and mid colon with air-fluid levels compatible with distal colonic obstruction. He then had CT abd/pelvis with annular constricting lesion within the sigmoid colon with markedly dilated colon proximal to  this lesion with air-fluid levels, compatible with distal colonic obstructing lesion concerning for malignancy as well as tiny hypodensities scattered throughout the liver, nonspecific but favor small cysts. General surgery was called and deferred first to GI who says to plan for likely sigmoidoscopy tomorrow.  Meds: Current Facility-Administered Medications  Medication Dose Route Frequency Provider Last Rate Last Dose  . potassium chloride 10 mEq in 100 mL IVPB  10 mEq Intravenous Once Waynetta Pean, PA-C 100 mL/hr at 03/11/15 2358 10 mEq at 03/11/15 2358   Current Outpatient Prescriptions  Medication Sig Dispense Refill  . hydrochlorothiazide (HYDRODIURIL) 25 MG tablet Take 1 tablet (25 mg total) by mouth daily. 90 tablet 1  . pravastatin (PRAVACHOL) 20 MG tablet Take 1 tablet (20 mg total) by mouth daily. (Patient taking differently: Take 20 mg by mouth daily at 6 PM. ) 90 tablet 4  . sulfamethoxazole-trimethoprim (BACTRIM DS,SEPTRA DS) 800-160 MG per tablet Take 1 tablet by mouth 2 (two) times daily.  5  . cephALEXin (KEFLEX) 500 MG capsule Take 1 capsule (500 mg total) by mouth 4 (four) times daily. (Patient not taking: Reported on 02/24/2015) 40 capsule 0  . cetirizine (ZYRTEC) 10 MG tablet Take 1 tablet (10 mg total) by mouth daily. (Patient not taking: Reported on 02/28/2015) 90 tablet 2  . fexofenadine (ALLEGRA) 180  MG tablet Take 1 tablet (180 mg total) by mouth daily. (Patient not taking: Reported on 02/24/2015) 30 tablet 0  . phenylephrine-shark liver oil-mineral oil-petrolatum (PREPARATION H) 0.25-3-14-71.9 % rectal ointment Place 1 application rectally 2 (two) times daily as needed for hemorrhoids. (Patient not taking: Reported on 02/24/2015) 30 g 0  . polyethylene glycol powder (GLYCOLAX/MIRALAX) powder Take 255 g by mouth once. (Patient not taking: Reported on 02/28/2015) 255 g 0    Allergies: Allergies as of 03/11/2015 - Review Complete 03/11/2015  Allergen Reaction Noted  .  Ciprofloxacin hcl Other (See Comments) 08/23/2011  . Ibuprofen Other (See Comments)    Past Medical History  Diagnosis Date  . Hyperlipidemia   . Hypertension   . Epididymitis     right  . Atony of bladder     followed at [facility]; CIC 3x/day, has been treated for culture proven serratia marcesens 05/2011, 06/2011, 07/2011, 09/2011  . [name] (benign prostatic hyperplasia)     hyposensitive bladder, nodular prostate with increased PSA (10.23 in 03/2006), s/p prostate biopsy 2006 that was normal (Dr Jeffie Pollock) he recommended TURP.  . Sinus bradycardia   . Chronic knee pain     left  . Gingivitis   . Right shoulder pain     sx in 06/2005  . Frozen shoulder     left frozen shoulder  . Shingles    Past Surgical History  Procedure Laterality Date  . Vasectomy    . Hydrocelectomy  11/14/2010    left hydrocelectomy  . Right shoulder arthroscopic surgery  06/2005  . Transurethral resection of prostate      status post TURP 2/2 [name] in 2008  . Knee surgery      L knee   No family history on file. History   Social History  . Marital Status: Single    Spouse Name: N/A  . Number of Children: N/A  . Years of Education: 12   Occupational History  .      retired   Social History Main Topics  . Smoking status: Former Smoker    Types: Cigarettes    Quit date: 02/07/1988  . Smokeless tobacco: Not on file  . Alcohol Use: No  . Drug Use: No  . Sexual Activity: Not on file   Other Topics Concern  . Not on file   Social History Narrative    Review of Systems: Review of systems negative except as noted above per HPI  Physical Exam: Blood pressure 127/87, pulse 69, temperature 98.4 F (36.9 C), temperature source Oral, resp. rate 20, height 5\' 11"  (1.803 m), weight 195 lb (88.451 kg), SpO2 93 %.  Gen: No acute distress, well developed, well nourished HEENT: Atraumatic, PERRL, EOMI, sclerae anicteric, moist mucous membranes Heart: Regular rate and rhythm, normal S1 S2, no  murmurs, rubs, or gallops Lungs: Clear to auscultation bilaterally, respirations unlabored Abd: Distended with mild softness, diffuse tenderness, hypoactive bowel sounds, no hepatosplenomegaly Ext: No edema or cyanosis  Lab results: Basic Metabolic Panel:  Recent Labs  03/11/15 1800 03/11/15 1810  NA 135 136  K 3.0* 2.8*  CL 95* 95*  CO2 22  --   GLUCOSE 78 117*  BUN 15 17  CREATININE 1.06 1.00  CALCIUM 8.8*  --    CBC:  Recent Labs  03/11/15 1800 03/11/15 1810  WBC 9.8  --   NEUTROABS 8.4*  --   HGB 15.2 17.7*  HCT 45.8 52.0  MCV 93.1  --   PLT 335  --  85% neutrophil, 7% lymphocyte, 8% monocyte  Imaging results:  Ct Abdomen Pelvis W Contrast  03/11/2015   CLINICAL DATA:  Lower abdominal pain for 1 week.  Constipation.  EXAM: CT ABDOMEN AND PELVIS WITH CONTRAST  TECHNIQUE: Multidetector CT imaging of the abdomen and pelvis was performed using the standard protocol following bolus administration of intravenous contrast.  CONTRAST:  199mL OMNIPAQUE IOHEXOL 300 MG/ML  SOLN  COMPARISON:  Plain films today.  CT 02/27/2015  FINDINGS: Lower chest: Linear subsegmental atelectasis in the lung bases. No effusions. Heart is upper limits normal in size.  Hepatobiliary: Scattered small nonspecific hypodensities throughout the liver, stable. Gallbladder unremarkable. No biliary ductal dilatation.  Pancreas: No focal abnormality or ductal dilatation.  Spleen: No focal abnormality.  Normal size.  Adrenals/Urinary Tract: No focal renal or adrenal abnormality. No hydronephrosis. Urinary bladder is unremarkable.  Stomach/Bowel: The colon is dilated and fluid-filled with air-fluid levels. Stomach and small bowel are decompressed. The colon is dilated to an annular constricting lesion within the sigmoid colon.  Vascular/Lymphatic: No retroperitoneal or mesenteric adenopathy. Aorta normal caliber.  Reproductive: No mass or other significant abnormality.  Other: No free fluid or free air.   Musculoskeletal: Degenerative changes in the lumbar spine. No acute findings.  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 Findings compatible with distal colonic obstructing lesion concerning for malignancy.  Tiny hypodensities scattered throughout the liver, nonspecific, favor small cysts.   Electronically Signed   By: Rolm Baptise M.D.   On: 03/11/2015 21:17   Dg Abd Acute W/chest  03/11/2015   CLINICAL DATA:  Constipation for 5 days.  Lower abdominal pain.  EXAM: DG ABDOMEN ACUTE W/ 1V CHEST  COMPARISON:  02/27/2015  FINDINGS: Dilated small bowel loops and colon with air-fluid levels. Findings concerning for distal colonic obstruction. No free air. No organomegaly or suspicious calcification.  Bibasilar atelectasis. Heart is upper limits normal in size. No effusions or acute bony abnormality.  IMPRESSION: Dilated small bowel and proximal and mid colon with air-fluid levels compatible with distal colonic obstruction.   Electronically Signed   By: Rolm Baptise M.D.   On: 03/11/2015 17:51    Other results: EKG: none in ED  Assessment & Plan by Problem: Active Problems:   * No active hospital problems. 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 He is a patient of Dr Paulita Fujita of [facility]. He reports he had a colonoscopy about four years ago but GI clinic note from 07/06/14 was for evaluation prior to possible colonoscopy and notes he has never had one. GI recommends making NPO tonight and feels NG tube not needed as small bowel is not involved, only colon.  -appreciate GI -likely sigmoidoscopy tomorrow -NPO -morphine 1 mg q4hprn -zofran 4 mg iv q4hprn -repeat portable AXR in am -lactic acid  #Hypokalemia: K of 3.0. Likely due to decreased po intake. -KCl 40  mEq iv   #HTN: BP 120s-140s/At home he is on HCTZ 25 mg daily, pravastatin 20 mg daily. 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 He is on chronic bactrim ds 1 tab bid. -bactrim iv per pharmacy -care order for assist with self catheterization as needed  #Diet: NPO  #DVT PPx: SCDs   #Code: Full  Dispo: Disposition is  deferred at this time, awaiting improvement of current medical problems. Anticipated discharge in approximately 1-2 day(s).   The patient does have a current PCP Norman Herrlich, MD) and does need an [facility] hospital follow-up appointment after discharge.  The patient does not know have transportation limitations that hinder transportation to clinic appointments.  Signed: Lottie Mussel, MD Internal Medicine, PGY-1 Pager [phone] 03/12/2015, 12:49 AM

## 2015-03-11 NOTE — ED Provider Notes (Signed)
CSN: [id number]     Arrival date & time 03/11/15  1305 History   First MD Initiated Contact with Patient 03/11/15 1520     Chief Complaint  Patient presents with  . Constipation   Greg [name] is a 74 y.o. male with a history of atrophic bladder, [name], and hypertension who presents to the ED complaining of constipation and bilateral lower abdominal pain ongoing for about 2 weeks. The patient reports his lower abdominal pain returned approximately 2 days ago and he is been having bilateral lower abdominal pain intermittently today. He also complains of return of constipation. He reports having a small bowel movement last night and prior to this his last bowel movement was 2-3 days ago. He denies hematochezia or melena. He reports he last passed gas yesterday. He reports taking MiraLAX from his previous visit every day. The patient was seen in the emergency department 02/27/2015 and had an x-ray which showed a possible obstruction and then a CT scan with contrast which was negative for obstruction or ileus 12 days ago. The CT scan did indicate an annular constricting lesion in his sigmoid colon. The patient was directed to follow-up with gastroenterology which he not been able to do. Upon my evaluation the patient reports his abdominal pain resolved just before I walked in the room. He reported his pain was 5 out of 10 and described as an ache but upon my examination his pain has resolved. He reports his pain has been intermittent. The patient does report he vomited one time today and had some nausea during this episode but this has all resolved today. He reports feeling hungry but has not wanted to eat anything due to his abdominal pain. He last ate last night. The patient self caths due to an atrophic bladder and [name]. He reports he has been able to easily self cath and does urinate some on his own. The patient denies fevers, chills, hematemesis, hematochezia, melena, urinary symptoms, hematuria, chest pain,  shortness of breath, cough, or rashes.   (Consider location/radiation/quality/duration/timing/severity/associated sxs/prior Treatment) HPI  Past Medical History  Diagnosis Date  . Hyperlipidemia   . Hypertension   . Epididymitis     right  . Atony of bladder     followed at [facility]; CIC 3x/day, has been treated for culture proven serratia marcesens 05/2011, 06/2011, 07/2011, 09/2011  . [name] (benign prostatic hyperplasia)     hyposensitive bladder, nodular prostate with increased PSA (10.23 in 03/2006), s/p prostate biopsy 2006 that was normal (Dr Jeffie Pollock) he recommended TURP.  . Sinus bradycardia   . Chronic knee pain     left  . Gingivitis   . Right shoulder pain     sx in 06/2005  . Frozen shoulder     left frozen shoulder  . Shingles    Past Surgical History  Procedure Laterality Date  . Vasectomy    . Hydrocelectomy  11/14/2010    left hydrocelectomy  . Right shoulder arthroscopic surgery  06/2005  . Transurethral resection of prostate      status post TURP 2/2 [name] in 2008  . Knee surgery      L knee   No family history on file. History  Substance Use Topics  . Smoking status: Former Smoker    Types: Cigarettes    Quit date: 02/07/1988  . Smokeless tobacco: Not on file  . Alcohol Use: No    Review of Systems  Constitutional: Negative for fever and chills.  HENT: Negative for  congestion and sore throat.   Eyes: Negative for visual disturbance.  Respiratory: Negative for cough, shortness of breath and wheezing.   Cardiovascular: Negative for chest pain and palpitations.  Gastrointestinal: Positive for nausea, vomiting, abdominal pain and constipation. Negative for diarrhea and blood in stool.  Genitourinary: Negative for dysuria and hematuria.  Musculoskeletal: Negative for back pain and neck pain.  Skin: Negative for rash.  Neurological: Negative for headaches.      Allergies  Ciprofloxacin hcl and Ibuprofen  Home Medications   Prior to  Admission medications   Medication Sig Start Date End Date Taking? Authorizing Provider  hydrochlorothiazide (HYDRODIURIL) 25 MG tablet Take 1 tablet (25 mg total) by mouth daily. 09/30/14  Yes Ejiroghene E Emokpae, MD  pravastatin (PRAVACHOL) 20 MG tablet Take 1 tablet (20 mg total) by mouth daily. Patient taking differently: Take 20 mg by mouth daily at 6 PM.  01/28/15  Yes Norman Herrlich, MD  sulfamethoxazole-trimethoprim (BACTRIM DS,SEPTRA DS) 800-160 MG per tablet Take 1 tablet by mouth 2 (two) times daily. 03/01/15  Yes Historical Provider, MD  cephALEXin (KEFLEX) 500 MG capsule Take 1 capsule (500 mg total) by mouth 4 (four) times daily. Patient not taking: Reported on 02/24/2015 11/04/14   Montine Circle, PA-C  cetirizine (ZYRTEC) 10 MG tablet Take 1 tablet (10 mg total) by mouth daily. Patient not taking: Reported on 02/28/2015 09/30/14   Ejiroghene E Denton Brick, MD  fexofenadine (ALLEGRA) 180 MG tablet Take 1 tablet (180 mg total) by mouth daily. Patient not taking: Reported on 02/24/2015 12/15/14   Blain Pais, MD  phenylephrine-shark liver oil-mineral oil-petrolatum (PREPARATION H) 0.25-3-14-71.9 % rectal ointment Place 1 application rectally 2 (two) times daily as needed for hemorrhoids. Patient not taking: Reported on 02/24/2015 06/07/14   Juluis Mire, MD  polyethylene glycol powder (GLYCOLAX/MIRALAX) powder Take 255 g by mouth once. Patient not taking: Reported on 02/28/2015 12/15/14   Blain Pais, MD   BP 127/87 mmHg  Pulse 69  Temp(Src) 98.4 F (36.9 C) (Oral)  Resp 20  Ht 5\' 11"  (1.803 m)  Wt 195 lb (88.451 kg)  BMI 27.21 kg/m2  SpO2 93% Physical Exam  Constitutional: He appears well-developed and well-nourished. No distress.  Nontoxic appearing.  HENT:  Head: Normocephalic and atraumatic.  Mouth/Throat: Oropharynx is clear and moist. No oropharyngeal exudate.  Eyes: Conjunctivae are normal. Pupils are equal, round, and reactive to light. Right eye exhibits no  discharge. Left eye exhibits no discharge.  Neck: Neck supple. No JVD present.  Cardiovascular: Normal rate, regular rhythm, normal heart sounds and intact distal pulses.  Exam reveals no gallop and no friction rub.   No murmur heard. Pulmonary/Chest: Effort normal and breath sounds normal. No respiratory distress. He has no wheezes. He has no rales.  Abdominal: Soft. Bowel sounds are normal. He exhibits no mass. There is tenderness. There is no rebound and no guarding.  Abdomen is soft. Bowel sounds are present. Mild bilateral lower abdominal tenderness to palpation.  No rebound tenderness. Negative psoas sign.   Genitourinary: Rectum normal.  Digital rectal exam performed by me with male RN as  chaperone. Patient has soft brown stool on rectal exam. No gross bloody stool. No evidence of impaction.  Musculoskeletal: He exhibits no edema.  Lymphadenopathy:    He has no cervical adenopathy.  Neurological: He is alert. Coordination normal.  Skin: Skin is warm and dry. No rash noted. He is not diaphoretic. No erythema. No pallor.  Psychiatric: He has a normal  mood and affect. His behavior is normal.  Nursing note and vitals reviewed.   ED Course  Procedures (including critical care time) Labs Review Labs Reviewed  BASIC METABOLIC PANEL - Abnormal; Notable for the following:    Potassium 3.0 (*)    Chloride 95 (*)    Calcium 8.8 (*)    Anion gap 18 (*)    All other components within normal limits  CBC WITH DIFFERENTIAL/PLATELET - Abnormal; Notable for the following:    Neutrophils Relative % 85 (*)    Neutro Abs 8.4 (*)    Lymphocytes Relative 7 (*)    All other components within normal limits  I-STAT CHEM 8, ED - Abnormal; Notable for the following:    Potassium 2.8 (*)    Chloride 95 (*)    Glucose, Bld 117 (*)    Calcium, Ion 1.06 (*)    Hemoglobin 17.7 (*)    All other components within normal limits    Imaging Review Ct Abdomen Pelvis W Contrast  03/11/2015   CLINICAL  DATA:  Lower abdominal pain for 1 week.  Constipation.  EXAM: CT ABDOMEN AND PELVIS WITH CONTRAST  TECHNIQUE: Multidetector CT imaging of the abdomen and pelvis was performed using the standard protocol following bolus administration of intravenous contrast.  CONTRAST:  177mL OMNIPAQUE IOHEXOL 300 MG/ML  SOLN  COMPARISON:  Plain films today.  CT 02/27/2015  FINDINGS: Lower chest: Linear subsegmental atelectasis in the lung bases. No effusions. Heart is upper limits normal in size.  Hepatobiliary: Scattered small nonspecific hypodensities throughout the liver, stable. Gallbladder unremarkable. No biliary ductal dilatation.  Pancreas: No focal abnormality or ductal dilatation.  Spleen: No focal abnormality.  Normal size.  Adrenals/Urinary Tract: No focal renal or adrenal abnormality. No hydronephrosis. Urinary bladder is unremarkable.  Stomach/Bowel: The colon is dilated and fluid-filled with air-fluid levels. Stomach and small bowel are decompressed. The colon is dilated to an annular constricting lesion within the sigmoid colon.  Vascular/Lymphatic: No retroperitoneal or mesenteric adenopathy. Aorta normal caliber.  Reproductive: No mass or other significant abnormality.  Other: No free fluid or free air.  Musculoskeletal: Degenerative changes in the lumbar spine. No acute findings.  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 Findings compatible with distal colonic obstructing lesion concerning for malignancy.  Tiny hypodensities scattered throughout the liver, nonspecific, favor small cysts.   Electronically Signed   By: Rolm Baptise M.D.   On: 03/11/2015 21:17   Dg Abd Acute W/chest  03/11/2015   CLINICAL DATA:  Constipation for 5 days.  Lower abdominal pain.  EXAM: DG ABDOMEN ACUTE W/ 1V CHEST  COMPARISON:  02/27/2015  FINDINGS: Dilated small bowel loops and colon with air-fluid levels. Findings concerning for  distal colonic obstruction. No free air. No organomegaly or suspicious calcification.  Bibasilar atelectasis. Heart is upper limits normal in size. No effusions or acute bony abnormality.  IMPRESSION: Dilated small bowel and proximal and mid colon with air-fluid levels compatible with distal colonic obstruction.   Electronically Signed   By: Rolm Baptise M.D.   On: 03/11/2015 17:51     EKG Interpretation None      Filed Vitals:   03/11/15 2230 03/11/15 2245 03/11/15 2300 03/11/15 2318  BP:   127/87 127/87  Pulse: 79 70 74 69  Temp:      TempSrc:      Resp:    20  Height:  Weight:      SpO2: 91% 92% 95% 93%     MDM   Meds given in ED:  Medications  potassium chloride 10 mEq in 100 mL IVPB (10 mEq Intravenous New Bag/Given 03/11/15 2358)  iohexol (OMNIPAQUE) 300 MG/ML solution 25 mL (25 mLs Oral Contrast Given 03/11/15 1850)  iohexol (OMNIPAQUE) 300 MG/ML solution 100 mL (100 mLs Intravenous Contrast Given 03/11/15 2037)    New Prescriptions   No medications on file    Final diagnoses:  Colonic obstruction  Hypokalemia  Lower abdominal pain    This is a 74 y.o. male with a history of atrophic bladder, [name], and hypertension who presents to the ED complaining of constipation and bilateral lower abdominal pain ongoing for about 2 weeks. The patient reports his lower abdominal pain returned approximately 2 days ago and he is been having bilateral lower abdominal pain intermittently today. He reports vomiting one time today and on my evaluation the patient denies abdominal pain or nausea. On exam the patient is afebrile and nontoxic appearing. He has some mild bilateral lower abdominal tenderness. Bowel sounds are present and abdomen is soft. Digital rectal exam revealed no impaction and soft brown stool. No gross bloody stool noted.  Plain film of his abdomen and chest indicated dilated small bowel and proximal and mid colon with air-fluid levels compatible with a distal colonic  obstruction. CT abdomen pelvis with contrast was obtained which showed an annular constricting lesion within the sigmoid colon with markedly dilated colon proximal to this lesion with air-fluid levels. Reports the findings are consistent with a distal colonic obstructing lesion that is concerning for malignancy. The patient had a similar CT scan 12 days ago but showed no signs of obstruction. This obstruction is new as of his CT today. At revaluation the patient has no current complaints. He denies current abdominal pain, but reports he had some a few minutes ago. He denies current nausea.  I consulted with surgeon Dr. Rosendo Gros who does not think he needs emergent surgery at this time, but would benefit from GI consult for colonoscopy. He reports he would see the patient if needed, but recommended medical admission at this time.  Consult placed for internal medicine teaching service for admission. Dr. Daryll Drown accepted the patient for admission and plans to consult GI. The patient is in agreement with admission. Dr. Daryll Drown later reported that she is working with [facility] hospital[facility] tonight and was accidentally consulted. She will speak with internal medicine teaching service tonight for admission.    This patient was discussed with and evaluated by Dr. Winfred Leeds who agrees with assessment and plan.     Waynetta Pean, PA-C 03/12/15 0022  Orlie Dakin, MD 03/12/15 0120

## 2015-03-11 NOTE — ED Notes (Signed)
Patient transported to CT 

## 2015-03-12 ENCOUNTER — Inpatient Hospital Stay (HOSPITAL_COMMUNITY): Payer: Medicare Other | Admitting: Anesthesiology

## 2015-03-12 ENCOUNTER — Encounter (HOSPITAL_COMMUNITY): Payer: Self-pay | Admitting: *Deleted

## 2015-03-12 ENCOUNTER — Encounter (HOSPITAL_COMMUNITY)
Admission: EM | Disposition: A | Discharge status: Home Health | Payer: Self-pay | Source: Home / Self Care | Attending: Internal Medicine

## 2015-03-12 DIAGNOSIS — R1030 Lower abdominal pain, unspecified: Secondary | ICD-10-CM | POA: Insufficient documentation

## 2015-03-12 DIAGNOSIS — C189 Malignant neoplasm of colon, unspecified: Secondary | ICD-10-CM | POA: Diagnosis not present

## 2015-03-12 DIAGNOSIS — E876 Hypokalemia: Secondary | ICD-10-CM | POA: Insufficient documentation

## 2015-03-12 DIAGNOSIS — Z87891 Personal history of nicotine dependence: Secondary | ICD-10-CM | POA: Diagnosis not present

## 2015-03-12 DIAGNOSIS — I10 Essential (primary) hypertension: Secondary | ICD-10-CM | POA: Diagnosis not present

## 2015-03-12 DIAGNOSIS — K56609 Unspecified intestinal obstruction, unspecified as to partial versus complete obstruction: Secondary | ICD-10-CM | POA: Diagnosis present

## 2015-03-12 DIAGNOSIS — K5660 Unspecified intestinal obstruction: Secondary | ICD-10-CM | POA: Diagnosis not present

## 2015-03-12 DIAGNOSIS — K5900 Constipation, unspecified: Secondary | ICD-10-CM | POA: Diagnosis not present

## 2015-03-12 DIAGNOSIS — M79609 Pain in unspecified limb: Secondary | ICD-10-CM | POA: Diagnosis not present

## 2015-03-12 DIAGNOSIS — N400 Enlarged prostate without lower urinary tract symptoms: Secondary | ICD-10-CM | POA: Diagnosis not present

## 2015-03-12 DIAGNOSIS — N3289 Other specified disorders of bladder: Secondary | ICD-10-CM | POA: Diagnosis not present

## 2015-03-12 DIAGNOSIS — K593 Megacolon, not elsewhere classified: Secondary | ICD-10-CM | POA: Diagnosis not present

## 2015-03-12 DIAGNOSIS — K5669 Other intestinal obstruction: Secondary | ICD-10-CM | POA: Diagnosis not present

## 2015-03-12 DIAGNOSIS — R1032 Left lower quadrant pain: Secondary | ICD-10-CM | POA: Diagnosis not present

## 2015-03-12 DIAGNOSIS — Z933 Colostomy status: Secondary | ICD-10-CM | POA: Diagnosis not present

## 2015-03-12 DIAGNOSIS — K6389 Other specified diseases of intestine: Secondary | ICD-10-CM | POA: Diagnosis present

## 2015-03-12 DIAGNOSIS — K922 Gastrointestinal hemorrhage, unspecified: Secondary | ICD-10-CM | POA: Diagnosis not present

## 2015-03-12 DIAGNOSIS — M79602 Pain in left arm: Secondary | ICD-10-CM | POA: Diagnosis not present

## 2015-03-12 DIAGNOSIS — Z9049 Acquired absence of other specified parts of digestive tract: Secondary | ICD-10-CM | POA: Diagnosis not present

## 2015-03-12 DIAGNOSIS — D374 Neoplasm of uncertain behavior of colon: Secondary | ICD-10-CM | POA: Diagnosis not present

## 2015-03-12 DIAGNOSIS — E785 Hyperlipidemia, unspecified: Secondary | ICD-10-CM | POA: Diagnosis present

## 2015-03-12 DIAGNOSIS — R933 Abnormal findings on diagnostic imaging of other parts of digestive tract: Secondary | ICD-10-CM | POA: Diagnosis not present

## 2015-03-12 DIAGNOSIS — C187 Malignant neoplasm of sigmoid colon: Secondary | ICD-10-CM | POA: Diagnosis not present

## 2015-03-12 DIAGNOSIS — K5909 Other constipation: Secondary | ICD-10-CM | POA: Diagnosis not present

## 2015-03-12 DIAGNOSIS — N312 Flaccid neuropathic bladder, not elsewhere classified: Secondary | ICD-10-CM | POA: Diagnosis present

## 2015-03-12 DIAGNOSIS — M7989 Other specified soft tissue disorders: Secondary | ICD-10-CM | POA: Diagnosis not present

## 2015-03-12 HISTORY — PX: COLOSTOMY REVISION: SHX5232

## 2015-03-12 HISTORY — PX: LAPAROTOMY: SHX154

## 2015-03-12 HISTORY — PX: COLOSTOMY: SHX63

## 2015-03-12 LAB — LACTIC ACID, PLASMA
LACTIC ACID, VENOUS: 1.2 mmol/L (ref 0.5–2.0)
LACTIC ACID, VENOUS: 1.4 mmol/L (ref 0.5–2.0)

## 2015-03-12 LAB — CBC
HCT: 43.7 % (ref 39.0–52.0)
Hemoglobin: 14.6 g/dL (ref 13.0–17.0)
MCH: 30.7 pg (ref 26.0–34.0)
MCHC: 33.4 g/dL (ref 30.0–36.0)
MCV: 91.8 fL (ref 78.0–100.0)
Platelets: 376 10*3/uL (ref 150–400)
RBC: 4.76 MIL/uL (ref 4.22–5.81)
RDW: 13.3 % (ref 11.5–15.5)
WBC: 10.1 10*3/uL (ref 4.0–10.5)

## 2015-03-12 LAB — COMPREHENSIVE METABOLIC PANEL
ALK PHOS: 54 U/L (ref 38–126)
ALT: 10 U/L — ABNORMAL LOW (ref 17–63)
AST: 14 U/L — ABNORMAL LOW (ref 15–41)
Albumin: 3 g/dL — ABNORMAL LOW (ref 3.5–5.0)
Anion gap: 12 (ref 5–15)
BUN: 15 mg/dL (ref 6–20)
CO2: 26 mmol/L (ref 22–32)
CREATININE: 1.1 mg/dL (ref 0.61–1.24)
Calcium: 8.5 mg/dL — ABNORMAL LOW (ref 8.9–10.3)
Chloride: 96 mmol/L — ABNORMAL LOW (ref 101–111)
GFR calc Af Amer: >60 mL/min (ref >60)
GLUCOSE: 146 mg/dL — AB (ref 65–99)
Potassium: 3.1 mmol/L — ABNORMAL LOW (ref 3.5–5.1)
Sodium: 134 mmol/L — ABNORMAL LOW (ref 135–145)
Total Bilirubin: 1 mg/dL (ref 0.3–1.2)
Total Protein: 6.5 g/dL (ref 6.5–8.1)

## 2015-03-12 LAB — SURGICAL PCR SCREEN
MRSA, PCR: NEGATIVE
STAPHYLOCOCCUS AUREUS: NEGATIVE

## 2015-03-12 LAB — MAGNESIUM: Magnesium: 2.3 mg/dL (ref 1.7–2.4)

## 2015-03-12 LAB — ABO/RH: ABO/RH(D): B POS

## 2015-03-12 LAB — TYPE AND SCREEN
ABO/RH(D): B POS
ANTIBODY SCREEN: NEGATIVE

## 2015-03-12 LAB — GLUCOSE, CAPILLARY: Glucose-Capillary: 106 mg/dL — ABNORMAL HIGH (ref 65–99)

## 2015-03-12 SURGERY — LAPAROTOMY, EXPLORATORY
Anesthesia: General | Site: Abdomen

## 2015-03-12 SURGERY — CANCELLED PROCEDURE

## 2015-03-12 MED ORDER — POVIDONE-IODINE 10 % EX OINT
TOPICAL_OINTMENT | CUTANEOUS | Status: AC
  Filled 2015-03-12: qty 28.35

## 2015-03-12 MED ORDER — POTASSIUM CHLORIDE 10 MEQ/100ML IV SOLN
10.0000 meq | INTRAVENOUS | Status: AC
  Administered 2015-03-12 (×4): 10 meq via INTRAVENOUS
  Filled 2015-03-12 (×4): qty 100

## 2015-03-12 MED ORDER — NEOSTIGMINE METHYLSULFATE 10 MG/10ML IV SOLN
INTRAVENOUS | Status: DC | PRN
  Administered 2015-03-12: 2 mg via INTRAVENOUS
  Administered 2015-03-12: 1 mg via INTRAVENOUS
  Administered 2015-03-12: 2 mg via INTRAVENOUS

## 2015-03-12 MED ORDER — ENOXAPARIN SODIUM 40 MG/0.4ML ~~LOC~~ SOLN
40.0000 mg | SUBCUTANEOUS | Status: DC
  Administered 2015-03-13 – 2015-03-19 (×7): 40 mg via SUBCUTANEOUS
  Filled 2015-03-12 (×8): qty 0.4

## 2015-03-12 MED ORDER — KCL IN DEXTROSE-NACL 10-5-0.45 MEQ/L-%-% IV SOLN
INTRAVENOUS | Status: DC
  Administered 2015-03-12 – 2015-03-16 (×10): via INTRAVENOUS
  Filled 2015-03-12 (×16): qty 1000

## 2015-03-12 MED ORDER — LIDOCAINE HCL (CARDIAC) 20 MG/ML IV SOLN
INTRAVENOUS | Status: AC
  Filled 2015-03-12: qty 5

## 2015-03-12 MED ORDER — DIPHENHYDRAMINE HCL 50 MG/ML IJ SOLN: 12.5000 mg | Freq: Four times a day (QID) | INTRAMUSCULAR | Status: DC | PRN

## 2015-03-12 MED ORDER — SULFAMETHOXAZOLE-TRIMETHOPRIM 400-80 MG/5ML IV SOLN
160.0000 mg | Freq: Two times a day (BID) | INTRAVENOUS | Status: DC
  Administered 2015-03-12: 160 mg via INTRAVENOUS
  Filled 2015-03-12 (×3): qty 10

## 2015-03-12 MED ORDER — ROCURONIUM BROMIDE 50 MG/5ML IV SOLN
INTRAVENOUS | Status: AC
  Filled 2015-03-12: qty 1

## 2015-03-12 MED ORDER — LACTATED RINGERS IV SOLN
INTRAVENOUS | Status: DC
  Administered 2015-03-12: 13:00:00 via INTRAVENOUS

## 2015-03-12 MED ORDER — FENTANYL CITRATE (PF) 100 MCG/2ML IJ SOLN: 25.0000 ug | INTRAMUSCULAR | Status: DC | PRN

## 2015-03-12 MED ORDER — MORPHINE SULFATE 2 MG/ML IJ SOLN: 1.0000 mg | INTRAMUSCULAR | Status: DC | PRN

## 2015-03-12 MED ORDER — 0.9 % SODIUM CHLORIDE (POUR BTL) OPTIME
TOPICAL | Status: DC | PRN
  Administered 2015-03-12 (×2): 1000 mL

## 2015-03-12 MED ORDER — MIDAZOLAM HCL 5 MG/ML IJ SOLN
INTRAMUSCULAR | Status: AC
  Filled 2015-03-12: qty 3

## 2015-03-12 MED ORDER — ONDANSETRON HCL 4 MG/2ML IJ SOLN
INTRAMUSCULAR | Status: AC
  Filled 2015-03-12: qty 2

## 2015-03-12 MED ORDER — NALOXONE HCL 0.4 MG/ML IJ SOLN: 0.4000 mg | INTRAMUSCULAR | Status: DC | PRN

## 2015-03-12 MED ORDER — DEXTROSE 5 % IV SOLN
2.0000 g | Freq: Two times a day (BID) | INTRAVENOUS | Status: AC
  Administered 2015-03-13: 2 g via INTRAVENOUS
  Filled 2015-03-12: qty 2

## 2015-03-12 MED ORDER — PROPOFOL 10 MG/ML IV BOLUS
INTRAVENOUS | Status: AC
  Filled 2015-03-12: qty 20

## 2015-03-12 MED ORDER — FENTANYL CITRATE (PF) 100 MCG/2ML IJ SOLN
INTRAMUSCULAR | Status: DC | PRN
  Administered 2015-03-12: 100 ug via INTRAVENOUS
  Administered 2015-03-12 (×3): 50 ug via INTRAVENOUS

## 2015-03-12 MED ORDER — ONDANSETRON HCL 4 MG/2ML IJ SOLN: 4.0000 mg | Freq: Four times a day (QID) | INTRAMUSCULAR | Status: DC | PRN

## 2015-03-12 MED ORDER — SUCCINYLCHOLINE CHLORIDE 20 MG/ML IJ SOLN
INTRAMUSCULAR | Status: DC | PRN
  Administered 2015-03-12: 120 mg via INTRAVENOUS

## 2015-03-12 MED ORDER — SODIUM CHLORIDE 0.9 % IJ SOLN: 9.0000 mL | INTRAMUSCULAR | Status: DC | PRN

## 2015-03-12 MED ORDER — FENTANYL CITRATE (PF) 250 MCG/5ML IJ SOLN
INTRAMUSCULAR | Status: AC
  Filled 2015-03-12: qty 5

## 2015-03-12 MED ORDER — LACTATED RINGERS IV SOLN
INTRAVENOUS | Status: DC | PRN
  Administered 2015-03-12 (×3): via INTRAVENOUS

## 2015-03-12 MED ORDER — ONDANSETRON HCL 4 MG PO TABS
4.0000 mg | ORAL_TABLET | Freq: Four times a day (QID) | ORAL | Status: DC | PRN
  Filled 2015-03-12: qty 1

## 2015-03-12 MED ORDER — SODIUM CHLORIDE 0.9 % IV SOLN
INTRAVENOUS | Status: DC
  Administered 2015-03-12: 02:00:00 via INTRAVENOUS

## 2015-03-12 MED ORDER — DEXTROSE 5 % IV SOLN
2.0000 g | INTRAVENOUS | Status: AC
  Administered 2015-03-12: 2 g via INTRAVENOUS
  Filled 2015-03-12 (×2): qty 2

## 2015-03-12 MED ORDER — DIPHENHYDRAMINE HCL 12.5 MG/5ML PO ELIX: 12.5000 mg | ORAL_SOLUTION | Freq: Four times a day (QID) | ORAL | Status: DC | PRN

## 2015-03-12 MED ORDER — SODIUM CHLORIDE 0.9 % IV SOLN: Freq: Once | INTRAVENOUS | Status: DC

## 2015-03-12 MED ORDER — ONDANSETRON HCL 4 MG/2ML IJ SOLN
INTRAMUSCULAR | Status: DC | PRN
  Administered 2015-03-12: 4 mg via INTRAVENOUS

## 2015-03-12 MED ORDER — FENTANYL CITRATE (PF) 100 MCG/2ML IJ SOLN
INTRAMUSCULAR | Status: AC
  Filled 2015-03-12: qty 2

## 2015-03-12 MED ORDER — MIDAZOLAM HCL 2 MG/2ML IJ SOLN
INTRAMUSCULAR | Status: AC
  Filled 2015-03-12: qty 2

## 2015-03-12 MED ORDER — GLYCOPYRROLATE 0.2 MG/ML IJ SOLN
INTRAMUSCULAR | Status: DC | PRN
  Administered 2015-03-12: 0.2 mg via INTRAVENOUS
  Administered 2015-03-12 (×2): 0.4 mg via INTRAVENOUS

## 2015-03-12 MED ORDER — LIDOCAINE HCL (CARDIAC) 20 MG/ML IV SOLN
INTRAVENOUS | Status: DC | PRN
  Administered 2015-03-12: 70 mg via INTRAVENOUS

## 2015-03-12 MED ORDER — MIDAZOLAM HCL 5 MG/5ML IJ SOLN
INTRAMUSCULAR | Status: DC | PRN
  Administered 2015-03-12 (×2): 1 mg via INTRAVENOUS

## 2015-03-12 MED ORDER — SODIUM CHLORIDE 0.9 % IV SOLN: INTRAVENOUS | Status: DC

## 2015-03-12 MED ORDER — PROPOFOL 10 MG/ML IV BOLUS
INTRAVENOUS | Status: DC | PRN
  Administered 2015-03-12: 150 mg via INTRAVENOUS

## 2015-03-12 MED ORDER — HYDROMORPHONE 0.3 MG/ML IV SOLN
INTRAVENOUS | Status: DC
  Administered 2015-03-12: 17:00:00 via INTRAVENOUS
  Administered 2015-03-12 – 2015-03-13 (×2): 0.3 mg via INTRAVENOUS
  Administered 2015-03-13: 0 mg via INTRAVENOUS
  Administered 2015-03-13: 0.6 mg via INTRAVENOUS

## 2015-03-12 MED ORDER — DIPHENHYDRAMINE HCL 50 MG/ML IJ SOLN
INTRAMUSCULAR | Status: AC
  Filled 2015-03-12: qty 1

## 2015-03-12 MED ORDER — ROCURONIUM BROMIDE 100 MG/10ML IV SOLN
INTRAVENOUS | Status: DC | PRN
  Administered 2015-03-12: 50 mg via INTRAVENOUS

## 2015-03-12 MED ORDER — HYDROMORPHONE 0.3 MG/ML IV SOLN
INTRAVENOUS | Status: AC
  Filled 2015-03-12: qty 25

## 2015-03-12 MED ORDER — SPOT INK MARKER SYRINGE KIT
PACK | SUBMUCOSAL | Status: AC
  Filled 2015-03-12: qty 5

## 2015-03-12 SURGICAL SUPPLY — 55 items
BLADE SURG ROTATE 9660 (MISCELLANEOUS) ×4 IMPLANT
BRR ADH 5X3 SEPRAFILM 6 SHT (MISCELLANEOUS)
CANISTER SUCTION 2500CC (MISCELLANEOUS) ×4 IMPLANT
CHLORAPREP W/TINT 26ML (MISCELLANEOUS) ×4 IMPLANT
COVER MAYO STAND STRL (DRAPES) IMPLANT
COVER SURGICAL LIGHT HANDLE (MISCELLANEOUS) ×4 IMPLANT
DRAPE LAPAROSCOPIC ABDOMINAL (DRAPES) ×4 IMPLANT
DRAPE PROXIMA HALF (DRAPES) IMPLANT
DRAPE UTILITY XL STRL (DRAPES) ×8 IMPLANT
DRAPE WARM FLUID 44X44 (DRAPE) ×4 IMPLANT
DRSG OPSITE POSTOP 4X10 (GAUZE/BANDAGES/DRESSINGS) IMPLANT
DRSG OPSITE POSTOP 4X8 (GAUZE/BANDAGES/DRESSINGS) IMPLANT
ELECT BLADE 6.5 EXT (BLADE) ×4 IMPLANT
ELECT CAUTERY BLADE 6.4 (BLADE) ×4 IMPLANT
ELECT REM PT RETURN 9FT ADLT (ELECTROSURGICAL) ×4
ELECTRODE REM PT RTRN 9FT ADLT (ELECTROSURGICAL) ×2 IMPLANT
GLOVE BIOGEL PI IND STRL 6.5 (GLOVE) ×2 IMPLANT
GLOVE BIOGEL PI IND STRL 7.0 (GLOVE) ×2 IMPLANT
GLOVE BIOGEL PI IND STRL 8 (GLOVE) ×4 IMPLANT
GLOVE BIOGEL PI INDICATOR 6.5 (GLOVE) ×2
GLOVE BIOGEL PI INDICATOR 7.0 (GLOVE) ×2
GLOVE BIOGEL PI INDICATOR 8 (GLOVE) ×4
GLOVE ECLIPSE 7.5 STRL STRAW (GLOVE) ×8 IMPLANT
GOWN STRL REUS W/ TWL LRG LVL3 (GOWN DISPOSABLE) ×4 IMPLANT
GOWN STRL REUS W/TWL LRG LVL3 (GOWN DISPOSABLE) ×8
KIT BASIN OR (CUSTOM PROCEDURE TRAY) ×4 IMPLANT
KIT OSTOMY DRAINABLE 2.75 STR (WOUND CARE) ×4 IMPLANT
KIT ROOM TURNOVER OR (KITS) ×4 IMPLANT
LIGASURE IMPACT 36 18CM CVD LR (INSTRUMENTS) ×4 IMPLANT
NS IRRIG 1000ML POUR BTL (IV SOLUTION) ×8 IMPLANT
PACK GENERAL/GYN (CUSTOM PROCEDURE TRAY) ×4 IMPLANT
PAD ARMBOARD 7.5X6 YLW CONV (MISCELLANEOUS) ×4 IMPLANT
PENCIL BUTTON HOLSTER BLD 10FT (ELECTRODE) IMPLANT
RELOAD PROXIMATE 75MM BLUE (ENDOMECHANICALS) ×4 IMPLANT
SEPRAFILM PROCEDURAL PACK 3X5 (MISCELLANEOUS) IMPLANT
SPECIMEN JAR LARGE (MISCELLANEOUS) IMPLANT
SPONGE GAUZE 4X4 12PLY STER LF (GAUZE/BANDAGES/DRESSINGS) ×4 IMPLANT
SPONGE LAP 18X18 X RAY DECT (DISPOSABLE) ×4 IMPLANT
STAPLER PROXIMATE 75MM BLUE (STAPLE) ×4 IMPLANT
STAPLER VISISTAT 35W (STAPLE) ×4 IMPLANT
SUCTION POOLE TIP (SUCTIONS) ×4 IMPLANT
SUT NOVA 1 T20/GS 25DT (SUTURE) IMPLANT
SUT PDS AB 1 TP1 96 (SUTURE) ×8 IMPLANT
SUT PROLENE 2 0 CT2 30 (SUTURE) ×4 IMPLANT
SUT SILK 2 0 SH CR/8 (SUTURE) ×8 IMPLANT
SUT SILK 2 0 TIES 10X30 (SUTURE) ×4 IMPLANT
SUT SILK 3 0 SH CR/8 (SUTURE) ×4 IMPLANT
SUT SILK 3 0 TIES 10X30 (SUTURE) ×4 IMPLANT
SUT VIC AB 3-0 SH 8-18 (SUTURE) ×4 IMPLANT
TAPE CLOTH SURG 4X10 WHT LF (GAUZE/BANDAGES/DRESSINGS) ×4 IMPLANT
TOWEL OR 17X26 10 PK STRL BLUE (TOWEL DISPOSABLE) ×4 IMPLANT
TRAY FOLEY CATH 16FRSI W/METER (SET/KITS/TRAYS/PACK) ×4 IMPLANT
TUBE CONNECTING 12'X1/4 (SUCTIONS)
TUBE CONNECTING 12X1/4 (SUCTIONS) IMPLANT
YANKAUER SUCT BULB TIP NO VENT (SUCTIONS) IMPLANT

## 2015-03-12 NOTE — Progress Notes (Signed)
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 Ciprofloxacin Hcl Other (See Comments)    Diarrhea   . Ibuprofen Other (See Comments)    REACTION: "not sure"    Patient Measurements: Height: 5\' 11"  (180.3 cm) Weight: 195 lb (88.451 kg) IBW/kg (Calculated) : 75.3   Vital Signs: BP: 148/94 mmHg (05/21 0051) Pulse Rate: 71 (05/21 0051) Intake/Output from previous day:   Intake/Output from this shift:    Labs:  Recent Labs  03/11/15 1800 03/11/15 1810  WBC 9.8  --   HGB 15.2 17.7*  PLT 335  --   CREATININE 1.06 1.00   Estimated Creatinine Clearance: 69 mL/min (by C-G formula based on Cr of 1). No results for input(s): VANCOTROUGH, VANCOPEAK, VANCORANDOM, GENTTROUGH, GENTPEAK, GENTRANDOM, TOBRATROUGH, TOBRAPEAK, TOBRARND, AMIKACINPEAK, AMIKACINTROU, AMIKACIN in the last 72 hours.   Microbiology: No results found for this or any previous visit (from the past 720 hour(s)).  Medical History: Past Medical History  Diagnosis Date  . Hyperlipidemia   . Hypertension   . Epididymitis     right  . Atony of bladder     followed at [facility]; CIC 3x/day, has been treated for culture proven serratia marcesens 05/2011, 06/2011, 07/2011, 09/2011  . [name] (benign prostatic hyperplasia)     hyposensitive bladder, nodular prostate with increased PSA (10.23 in 03/2006), s/p prostate biopsy 2006 that was normal (Dr Jeffie Pollock) he recommended TURP.  . Sinus bradycardia   . Chronic knee pain     left  . Gingivitis   . Right shoulder pain     sx in 06/2005  . Frozen shoulder     left frozen shoulder  . Shingles     Medications:  Prescriptions prior to admission  Medication Sig Dispense Refill Last Dose  . hydrochlorothiazide (HYDRODIURIL) 25 MG tablet Take 1 tablet (25 mg total) by mouth daily. 90 tablet 1 03/11/2015 at Unknown time  . pravastatin (PRAVACHOL) 20 MG tablet  Take 1 tablet (20 mg total) by mouth daily. (Patient taking differently: Take 20 mg by mouth daily at 6 PM. ) 90 tablet 4 03/10/2015 at Unknown time  . sulfamethoxazole-trimethoprim (BACTRIM DS,SEPTRA DS) 800-160 MG per tablet Take 1 tablet by mouth 2 (two) times daily.  5 03/10/2015 at Unknown time  . cephALEXin (KEFLEX) 500 MG capsule Take 1 capsule (500 mg total) by mouth 4 (four) times daily. (Patient not taking: Reported on 02/24/2015) 40 capsule 0 Not Taking at Unknown time  . cetirizine (ZYRTEC) 10 MG tablet Take 1 tablet (10 mg total) by mouth daily. (Patient not taking: Reported on 02/28/2015) 90 tablet 2 Not Taking at Unknown time  . fexofenadine (ALLEGRA) 180 MG tablet Take 1 tablet (180 mg total) by mouth daily. (Patient not taking: Reported on 02/24/2015) 30 tablet 0 Not Taking at Unknown time  . phenylephrine-shark liver oil-mineral oil-petrolatum (PREPARATION H) 0.25-3-14-71.9 % rectal ointment Place 1 application rectally 2 (two) times daily as needed for hemorrhoids. (Patient not taking: Reported on 02/24/2015) 30 g 0 Not Taking at Unknown time  . polyethylene glycol powder (GLYCOLAX/MIRALAX) powder Take 255 g by mouth once. (Patient not taking: Reported on 02/28/2015) 255 g 0 Not Taking at Unknown time   Scheduled:  . sulfamethoxazole-trimethoprim  160 mg Intravenous Q12H   Assessment: 74 y.o male with Atrophic bladder who was on chronic bactrim DS 1 tab bid prior to admission as prophylaxis for  UTI. Pharmacy asked to convert his oral bactrim dose to IV as he is unable to take po's now due to colon mass, NPO, likely sigmoidoscopy today.   Goal of Therapy:  Equivalent IV septra dose for oral Septra DS 1 tablet BID  Plan:  Bactrim/Septra (160mg  based on trimethoprim component) IV q12 hours.  Nicole Cella, RPh Clinical Pharmacist Pager: [phone] 03/12/2015,2:27 AM

## 2015-03-12 NOTE — Anesthesia Procedure Notes (Signed)
Procedure Name: Intubation Date/Time: 03/12/2015 2:05 PM Performed by: Rebekah Chesterfield L Pre-anesthesia Checklist: Patient identified, Emergency Drugs available, Suction available, Patient being monitored and Timeout performed Patient Re-evaluated:Patient Re-evaluated prior to inductionOxygen Delivery Method: Circle system utilized Preoxygenation: Pre-oxygenation with 100% oxygen Intubation Type: IV induction, Cricoid Pressure applied and Rapid sequence Laryngoscope Size: Mac and 4 Grade View: Grade I Tube type: Oral Tube size: 7.5 mm Number of attempts: 1 Airway Equipment and Method: Stylet Placement Confirmation: ETT inserted through vocal cords under direct vision,  breath sounds checked- equal and bilateral and positive ETCO2 Secured at: 21 cm Tube secured with: Tape Dental Injury: Teeth and Oropharynx as per pre-operative assessment

## 2015-03-12 NOTE — Progress Notes (Signed)
Inpatient Progress Note - Internal Medicine  Subjective: This morning the patient was resting in bed. He was not visibly in pain or anxious. He was reluctant to voice concerns about his pain control; use of PRN pain meds when needed was encouraged. He reports abdominal discomfort. He has not had a BM or passed flatus O/N. He denies nausea or vomiting O/N. He is amenable with plan for colonoscopy and likely surgery.  Objective: Vital signs in last 24 hours: Filed Vitals:   03/12/15 0051 03/12/15 0254 03/12/15 0526 03/12/15 1047  BP: 148/94 129/82 130/84 142/80  Pulse: 71 73 67 66  Temp:  98.2 F (36.8 C) 99.2 F (37.3 C) 98 F (36.7 C)  TempSrc:  Oral Oral   Resp: 20 18 16 16   Height:      Weight:  87.7 kg (193 lb 5.5 oz)    SpO2: 96% 98% 99% 95%   Weight change:   Intake/Output Summary (Last 24 hours) at 03/12/15 1052 Last data filed at 03/12/15 1829  Gross per 24 hour  Intake    415 ml  Output   1300 ml  Net   -885 ml   General: Calm, conversant elderly male lying in bed. HEENT:  EOMI, sclerae/conjunctiva clear, mmm, no oropharyngeal erythema Cardiac: RRR, no m/r/g Pulmonary:  CTAB, no increased WOB Abdominal: Distended, diffusely TTP, tympanic. Normoactive bowel sounds. No palpable masses. Fluid audible without stethoscope on pt shifting position. Extremities: No pedal edema. 2+ bilateral dorsal pedal pulses Neurologic:  Alert, conversant, content of speech appropriate.  Lab Results: Basic Metabolic Panel:  Recent Labs Lab 03/11/15 1800 03/11/15 1810 03/12/15 0027 03/12/15 0326  NA 135 136  --  134*  K 3.0* 2.8*  --  3.1*  CL 95* 95*  --  96*  CO2 22  --   --  26  GLUCOSE 78 117*  --  146*  BUN 15 17  --  15  CREATININE 1.06 1.00  --  1.10  CALCIUM 8.8*  --   --  8.5*  MG  --   --  2.3  --    Liver Function Tests:  Recent Labs Lab 03/12/15 0326  AST 14*  ALT 10*  ALKPHOS 54  BILITOT 1.0  PROT 6.5  ALBUMIN 3.0*   CBC:  Recent Labs Lab  03/11/15 1800 03/11/15 1810 03/12/15 0326  WBC 9.8  --  10.1  NEUTROABS 8.4*  --   --   HGB 15.2 17.7* 14.6  HCT 45.8 52.0 43.7  MCV 93.1  --  91.8  PLT 335  --  376   CBG:  Recent Labs Lab 03/12/15 0735  GLUCAP 106*   Micro Results: No results found for this or any previous visit (from the past 240 hour(s)). Studies/Results: Ct Abdomen Pelvis W Contrast  03/11/2015   CLINICAL DATA:  Lower abdominal pain for 1 week.  Constipation.  EXAM: CT ABDOMEN AND PELVIS WITH CONTRAST  TECHNIQUE: Multidetector CT imaging of the abdomen and pelvis was performed using the standard protocol following bolus administration of intravenous contrast.  CONTRAST:  136mL OMNIPAQUE IOHEXOL 300 MG/ML  SOLN  COMPARISON:  Plain films today.  CT 02/27/2015  FINDINGS: Lower chest: Linear subsegmental atelectasis in the lung bases. No effusions. Heart is upper limits normal in size.  Hepatobiliary: Scattered small nonspecific hypodensities throughout the liver, stable. Gallbladder unremarkable. No biliary ductal dilatation.  Pancreas: No focal abnormality or ductal dilatation.  Spleen: No focal abnormality.  Normal size.  Adrenals/Urinary Tract:  No focal renal or adrenal abnormality. No hydronephrosis. Urinary bladder is unremarkable.  Stomach/Bowel: The colon is dilated and fluid-filled with air-fluid levels. Stomach and small bowel are decompressed. The colon is dilated to an annular constricting lesion within the sigmoid colon.  Vascular/Lymphatic: No retroperitoneal or mesenteric adenopathy. Aorta normal caliber.  Reproductive: No mass or other significant abnormality.  Other: No free fluid or free air.  Musculoskeletal: Degenerative changes in the lumbar spine. No acute findings.  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 Findings compatible with distal colonic obstructing lesion concerning for malignancy.  Tiny  hypodensities scattered throughout the liver, nonspecific, favor small cysts.   Electronically Signed   By: Rolm Baptise M.D.   On: 03/11/2015 21:17   Dg Abd Acute W/chest  03/11/2015   CLINICAL DATA:  Constipation for 5 days.  Lower abdominal pain.  EXAM: DG ABDOMEN ACUTE W/ 1V CHEST  COMPARISON:  02/27/2015  FINDINGS: Dilated small bowel loops and colon with air-fluid levels. Findings concerning for distal colonic obstruction. No free air. No organomegaly or suspicious calcification.  Bibasilar atelectasis. Heart is upper limits normal in size. No effusions or acute bony abnormality.  IMPRESSION: Dilated small bowel and proximal and mid colon with air-fluid levels compatible with distal colonic obstruction.   Electronically Signed   By: Rolm Baptise M.D.   On: 03/11/2015 17:51   Medications: I have reviewed the patient's current medications. Scheduled Meds: . sulfamethoxazole-trimethoprim  160 mg Intravenous Q12H   Continuous Infusions: . sodium chloride 75 mL/hr at 03/12/15 0222  . sodium chloride     PRN Meds:.morphine injection, ondansetron **OR** ondansetron (ZOFRAN) IV Assessment/Plan: Principal Problem:   Colon obstruction Active Problems:   Essential hypertension   HEMOCCULT POSITIVE STOOL   Colonic mass Greg [name] is a 74 y.o. M p/w abdominal pain and obstipation with known annular constricting sigmoid mass c/f malignancy and CT with air-fluid levels and markedly distended colon proximal to mass.  Large Bowel Obstruction: Seen in ED 02/27/2015 with CT showing restricting annular sigmoid mass. Pt did not f/u subsequently with GI (Dr. Paulita Fujita of [facility]). Now presents with abdominal pain worse with eating, decreased PO, constipation, obstipation. Abd XR and CT show multiple air-fluid levels, massive colonic dilation proximal to annular constricting sigmoid mass concerning for malignancy. No night sweats, weight loss, fevers. Lactic acid 1.4. - GI has seen pt: no plan for colonoscopy  as exam is concerning for near-complete obstruction and urgent need for surgery. Surgery will evaluate. Appreciate further recs from GI and surgery. - Morphine 1 mg q3hr PRN - Zofran 4 mg q6hr PRN  Atrophic Bladder: Chronic, follows with Dr. Jeffie Pollock of [facility], self-caths at home. On chronic bactrim for UTI PPX. - Continue I&O cath - pt may continue to self-cath - Bactrim 160 mg IV BID per pharmacy  Hypokalemia: K 3.0 on presentation. Received IV KCl 10 mEq x 5 total. Likely 2/2 decreased PO. - Recheck in AM.  HTN: Pt normotensive since admission other than one SBP of 142. Home meds = HCTZ 25 mg daily. - Hold home HCTZ for NPO, likely abdominal surgery  F: NS @ 75 mL/hr E: Hypokalemia as above. CTM N: NPO for colonoscopy, likely abdominal surgery  PPX: SCDs  Dispo: Disposition is deferred at this time, awaiting improvement of current medical problems.  Anticipated discharge in approximately 2-3 day(s).   The patient does have a current PCP Norman Herrlich, MD)  and does need an [facility] hospital follow-up appointment after discharge.  The patient does not know have transportation limitations that hinder transportation to clinic appointments.  .Services Needed at time of discharge: Y = Yes, Blank = No PT:   OT:   RN:   Equipment:   Other:     LOS: 0 days   Susa Day, Med Student 03/12/2015, 10:52 AM

## 2015-03-12 NOTE — Progress Notes (Signed)
Subjective: Patient seen and examined this AM, appeared in no distress, stated he was having abdominal pain, quite distended on exam.   Objective: Vital signs in last 24 hours: Filed Vitals:   03/12/15 1105 03/12/15 1110 03/12/15 1115 03/12/15 1239  BP: 133/86 138/88 138/88 134/83  Pulse: 64 64 66 67  Temp:    98.3 F (36.8 C)  TempSrc:    Oral  Resp: 17 16 18 18   Height:      Weight:      SpO2: 99% 98% 99% 98%   Weight change:   Intake/Output Summary (Last 24 hours) at 03/12/15 1436 Last data filed at 03/12/15 1415  Gross per 24 hour  Intake   1415 ml  Output   1300 ml  Net    115 ml   Physical Exam: General: AA male, alert, cooperative, NAD. HEENT: PERRL, EOMI. Moist mucus membranes Neck: Full range of motion without pain, supple, no lymphadenopathy or carotid bruits Lungs: Clear to ascultation bilaterally, normal work of respiration, no wheezes, rales, rhonchi Heart: RRR, no murmurs, gallops, or rubs Abdomen: Soft, diffusely tender, quite distended, hypoactive bowel sounds.  Extremities: No cyanosis, clubbing, or edema Neurologic: Alert & oriented x3, cranial nerves II-XII intact, strength grossly intact, sensation intact to light touch   Lab Results: Basic Metabolic Panel:  Recent Labs Lab 03/11/15 1800 03/11/15 1810 03/12/15 0027 03/12/15 0326  NA 135 136  --  134*  K 3.0* 2.8*  --  3.1*  CL 95* 95*  --  96*  CO2 22  --   --  26  GLUCOSE 78 117*  --  146*  BUN 15 17  --  15  CREATININE 1.06 1.00  --  1.10  CALCIUM 8.8*  --   --  8.5*  MG  --   --  2.3  --    Liver Function Tests:  Recent Labs Lab 03/12/15 0326  AST 14*  ALT 10*  ALKPHOS 54  BILITOT 1.0  PROT 6.5  ALBUMIN 3.0*   CBC:  Recent Labs Lab 03/11/15 1800 03/11/15 1810 03/12/15 0326  WBC 9.8  --  10.1  NEUTROABS 8.4*  --   --   HGB 15.2 17.7* 14.6  HCT 45.8 52.0 43.7  MCV 93.1  --  91.8  PLT 335  --  376   CBG:  Recent Labs Lab 03/12/15 0735  GLUCAP 106*    Studies/Results: Ct Abdomen Pelvis W Contrast  03/11/2015   CLINICAL DATA:  Lower abdominal pain for 1 week.  Constipation.  EXAM: CT ABDOMEN AND PELVIS WITH CONTRAST  TECHNIQUE: Multidetector CT imaging of the abdomen and pelvis was performed using the standard protocol following bolus administration of intravenous contrast.  CONTRAST:  173mL OMNIPAQUE IOHEXOL 300 MG/ML  SOLN  COMPARISON:  Plain films today.  CT 02/27/2015  FINDINGS: Lower chest: Linear subsegmental atelectasis in the lung bases. No effusions. Heart is upper limits normal in size.  Hepatobiliary: Scattered small nonspecific hypodensities throughout the liver, stable. Gallbladder unremarkable. No biliary ductal dilatation.  Pancreas: No focal abnormality or ductal dilatation.  Spleen: No focal abnormality.  Normal size.  Adrenals/Urinary Tract: No focal renal or adrenal abnormality. No hydronephrosis. Urinary bladder is unremarkable.  Stomach/Bowel: The colon is dilated and fluid-filled with air-fluid levels. Stomach and small bowel are decompressed. The colon is dilated to an annular constricting lesion within the sigmoid colon.  Vascular/Lymphatic: No retroperitoneal or mesenteric adenopathy. Aorta normal caliber.  Reproductive: No mass or other significant  abnormality.  Other: No free fluid or free air.  Musculoskeletal: Degenerative changes in the lumbar spine. No acute findings.  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 Findings compatible with distal colonic obstructing lesion concerning for malignancy.  Tiny hypodensities scattered throughout the liver, nonspecific, favor small cysts.   Electronically Signed   By: Rolm Baptise M.D.   On: 03/11/2015 21:17   Dg Abd Acute W/chest  03/11/2015   CLINICAL DATA:  Constipation for 5 days.  Lower abdominal pain.  EXAM: DG ABDOMEN ACUTE W/ 1V CHEST  COMPARISON:  02/27/2015  FINDINGS: Dilated  small bowel loops and colon with air-fluid levels. Findings concerning for distal colonic obstruction. No free air. No organomegaly or suspicious calcification.  Bibasilar atelectasis. Heart is upper limits normal in size. No effusions or acute bony abnormality.  IMPRESSION: Dilated small bowel and proximal and mid colon with air-fluid levels compatible with distal colonic obstruction.   Electronically Signed   By: Rolm Baptise M.D.   On: 03/11/2015 17:51   Medications: I have reviewed the patient's current medications. Scheduled Meds: . sodium chloride   Intravenous Once  . cefoTEtan (CEFOTAN) IV  2 g Intravenous On Call to OR  . [MAR Hold] sulfamethoxazole-trimethoprim  160 mg Intravenous Q12H   Continuous Infusions: . sodium chloride 75 mL/hr at 03/12/15 0222  . sodium chloride    . lactated ringers 10 mL/hr at 03/12/15 1327   PRN Meds:.[MAR Hold]  morphine injection, [MAR Hold] ondansetron **OR** [MAR Hold] ondansetron (ZOFRAN) IV   Assessment/Plan: 74 y/o M w/ PMHx of HTN, HLD, [name] w/ atrophic bladder, admitted for obstructive colonic mass.   Colon Mass w/ Obstruction: Previously noted to have sigmoid/colonic mass on CT in ED on 02/27/15, discharged at that time and told to follow up w/ Dr. Paulita Fujita. Presented overnight w/ obstipation, abdominal pain, and vomiting. Repeat CT on admission shows an annular constricting lesion within the sigmoid colon with markedly dilated colon proximal to this lesion with air-fluid levels. Findings compatible with distal colonic obstructing lesion concerning for malignancy. Seen by Dr. Michail Sermon today, felt that abdominal exam warranted emergent surgery. Taken to OR by Dr. Hulen Skains for colectomy + colostomy. -Management per surgery -Will follow up s/p surgery  Hypokalemia: K 3.1 most recently, supplemented w/ another 10 mEq x4 of K prior to going to the OR.  -Repeat BMP in AM  HTN: BP med on hold for now  Atrophic Bladder: Self-cath per patient. Follows w/  [facility]. Takes bactrim chronically at home.  -Continue Bactrim IV per pharmacy  DVT/PE PPx: SCD's  Dispo: Disposition is deferred at this time, awaiting improvement of current medical problems.  Anticipated discharge in approximately 3-4 day(s).   The patient does have a current PCP Norman Herrlich, MD) and does need an [facility] hospital follow-up appointment after discharge.  The patient does not have transportation limitations that hinder transportation to clinic appointments.  .Services Needed at time of discharge: Y = Yes, Blank = No PT:   OT:   RN:   Equipment:   Other:     LOS: 0 days   Corky Sox, MD 03/12/2015, 2:36 PM

## 2015-03-12 NOTE — Op Note (Signed)
OPERATIVE REPORT  DATE OF OPERATION:  03/12/2015  PATIENT:  Greg [name]  74 y.o. male  PRE-OPERATIVE DIAGNOSIS:  Large bowel obstruction from sigmoid colon mass  POST-OPERATIVE DIAGNOSIS:  Large bowel obstruction from sigmoid colon mass  PROCEDURE:  Procedure(s): EXPLORATORY LAPAROTOMY  RESECTION SIGMOID COLON COLOSTOMY  SURGEON:  Surgeon(s): Judeth Horn, MD Excell Seltzer, MD  ASSISTANT: Excell Seltzer, M.D.  ANESTHESIA:   general  EBL: 75 ml  BLOOD ADMINISTERED: none  DRAINS: Nasogastric Tube, Urinary Catheter (Foley) and descending colon end colostomy   SPECIMEN:  Source of Specimen:  Sigmoid colon  COUNTS CORRECT:  YES  PROCEDURE DETAILS: The patient was taken to the operating room and placed on the table in supine position. After an adequate general endotracheal anesthetic was administered he was prepped and draped in the usual sterile manner exposing his entire abdomen.  The patient was being brought to the operating room because of the large bowel obstruction from a sigmoid colon mass. A proper timeout was performed identifying the patient and the procedure to be performed.  A midline incision was made from just above the umbilicus down to below the umbilicus down to just above the pubis. It was taken down to and through the midline fascia using electrocautery. Once we entered the peritoneal cavity and extended the incision the markedly dilated sigmoid colon and descending colon was noted in the midportion of the abdomen. There were omental adhesions to the anterior abdominal wall in the left upper quadrant which could not be accounted for since the patient had never had a previous abdominal operation. We took down the left colon from the line of Toldt and we then could palpate the obstructing mid sigmoid lesion. A 10 cm proximal margin was taken using a GIA-75 stapler, across the markedly dilated descending colon. A 5-8 cm distal margin was taken also with a GIA-75  stapler. The mesentery was controlled using a LigaSure device along with a Kelly clamp and a 2-0 silk suture ligature on the largest sigmoid colon mesenteric vessel.  Once the colon was resected we inspected for other evidence of disease. We were able to palpate the colon throughout its course and visualize it on the right side and at the splenic flexure where it was noted to be apparently normal without ischemia not perforated and with no other evident lesions. We were able to palpate the liver and no metastatic lesions are noted. The NG tube could be palpated in the distal esophagus and was subsequently passed into the body of the stomach where it remained at the end of the case. The distal Hartman's pouch was marked with a 2-0 Prolene suture.  The end colostomy was brought out the left midportion of the abdomen just inferior to the umbilicus on the left side. It was matured after the abdomen fascia and skin were closed using interrupted 3-0 Vicryl pop-off sutures.  Prior to closure we irrigated with saline solution but there was no contamination it was considered a class II wound case.  The midline fascia was reapproximated using running looped #1 PDS suture. The skin was closed using stainless steel stable and protected as the colostomy was matured. All needle counts, sponge counts, and instrument counts were correct.  PATIENT DISPOSITION:  PACU - hemodynamically stable.   Greg [name] 5/21/20163:31 PM

## 2015-03-12 NOTE — Progress Notes (Signed)
Patient ID: Greg [name], male   DOB: 12-Sep-1941, 74 y.o.   MRN: [id number]  Colonoscopy (unprepped) cancelled prior to initation of sedation or insertion of colonoscope due to worsening of abdominal exam in the setting of a distal colonic obstruction (mass). I feel he is nearly completely obstructed and needs surgical intervention. D/W Dr. Hulen Skains who will evaluate.

## 2015-03-12 NOTE — Interval H&P Note (Signed)
History and Physical Interval Note:  03/12/2015 11:06 AM  Greg [name]  has presented today for surgery, with the diagnosis of colon cancer  The various methods of treatment have been discussed with the patient and family. After consideration of risks, benefits and other options for treatment, the patient has consented to  Procedure(s): FLEXIBLE SIGMOIDOSCOPY (N/A) as a surgical intervention .  The patient's history has been reviewed, patient examined, no change in status, stable for surgery.  I have reviewed the patient's chart and labs.  Questions were answered to the patient's satisfaction.     Hernando C.

## 2015-03-12 NOTE — Consult Note (Signed)
Reason for Consult:Large bowel obstruction from colon mass Referring Physician: [name] is an 74 y.o. male.  Greg [name]: Patient has been having issues with blood in stools and constipation since late 2015, but over the last two days has had now bowel movement and no gas.  Complete obstipation.  Has had blood in stools.  Past Medical History  Diagnosis Date  . Hyperlipidemia   . Hypertension   . Epididymitis     right  . Atony of bladder     followed at [facility]; CIC 3x/day, has been treated for culture proven serratia marcesens 05/2011, 06/2011, 07/2011, 09/2011  . [name] (benign prostatic hyperplasia)     hyposensitive bladder, nodular prostate with increased PSA (10.23 in 03/2006), s/p prostate biopsy 2006 that was normal (Dr Jeffie Pollock) he recommended TURP.  . Sinus bradycardia   . Chronic knee pain     left  . Gingivitis   . Right shoulder pain     sx in 06/2005  . Frozen shoulder     left frozen shoulder  . Shingles     Past Surgical History  Procedure Laterality Date  . Vasectomy    . Hydrocelectomy  11/14/2010    left hydrocelectomy  . Right shoulder arthroscopic surgery  06/2005  . Transurethral resection of prostate      status post TURP 2/2 [name] in 2008  . Knee surgery      L knee    History reviewed. No pertinent family history.  Social History:  reports that he quit smoking about 27 years ago. His smoking use included Cigarettes. He does not have any smokeless tobacco history on file. He reports that he does not drink alcohol or use illicit drugs.  Allergies:  Allergies  Allergen Reactions  . Ciprofloxacin Hcl Other (See Comments)    Diarrhea   . Ibuprofen Other (See Comments)    REACTION: "not sure"    Medications: I have reviewed the patient's current medications.  Results for orders placed or performed during the hospital encounter of 03/11/15 (from the past 48 hour(s))  Basic metabolic panel     Status: Abnormal   Collection Time:  03/11/15  6:00 PM  Result Value Ref Range   Sodium 135 135 - 145 mmol/L   Potassium 3.0 (L) 3.5 - 5.1 mmol/L   Chloride 95 (L) 101 - 111 mmol/L   CO2 22 22 - 32 mmol/L   Glucose, Bld 78 65 - 99 mg/dL   BUN 15 6 - 20 mg/dL   Creatinine, Ser 1.06 0.61 - 1.24 mg/dL   Calcium 8.8 (L) 8.9 - 10.3 mg/dL   GFR calc non Af Amer >60 >60 mL/min   GFR calc Af Amer >60 >60 mL/min    Comment: (NOTE) The eGFR has been calculated using the CKD EPI equation. This calculation has not been validated in all clinical situations. eGFR's persistently <60 mL/min signify possible Chronic Kidney Disease.    Anion gap 18 (H) 5 - 15  CBC with Differential     Status: Abnormal   Collection Time: 03/11/15  6:00 PM  Result Value Ref Range   WBC 9.8 4.0 - 10.5 K/uL   RBC 4.92 4.22 - 5.81 MIL/uL   Hemoglobin 15.2 13.0 - 17.0 g/dL   HCT 45.8 39.0 - 52.0 %   MCV 93.1 78.0 - 100.0 fL   MCH 30.9 26.0 - 34.0 pg   MCHC 33.2 30.0 - 36.0 g/dL   RDW 13.6 11.5 - 15.5 %  Platelets 335 150 - 400 K/uL   Neutrophils Relative % 85 (H) 43 - 77 %   Neutro Abs 8.4 (H) 1.7 - 7.7 K/uL   Lymphocytes Relative 7 (L) 12 - 46 %   Lymphs Abs 0.7 0.7 - 4.0 K/uL   Monocytes Relative 8 3 - 12 %   Monocytes Absolute 0.7 0.1 - 1.0 K/uL   Eosinophils Relative 0 0 - 5 %   Eosinophils Absolute 0.0 0.0 - 0.7 K/uL   Basophils Relative 0 0 - 1 %   Basophils Absolute 0.0 0.0 - 0.1 K/uL  I-Stat Chem 8, ED     Status: Abnormal   Collection Time: 03/11/15  6:10 PM  Result Value Ref Range   Sodium 136 135 - 145 mmol/L   Potassium 2.8 (L) 3.5 - 5.1 mmol/L   Chloride 95 (L) 101 - 111 mmol/L   BUN 17 6 - 20 mg/dL   Creatinine, Ser 1.00 0.61 - 1.24 mg/dL   Glucose, Bld 117 (H) 65 - 99 mg/dL   Calcium, Ion 1.06 (L) 1.13 - 1.30 mmol/L   TCO2 23 0 - 100 mmol/L   Hemoglobin 17.7 (H) 13.0 - 17.0 g/dL   HCT 52.0 39.0 - 52.0 %  Magnesium     Status: None   Collection Time: 03/12/15 12:27 AM  Result Value Ref Range   Magnesium 2.3 1.7 - 2.4  mg/dL  Lactic acid, plasma     Status: None   Collection Time: 03/12/15 12:27 AM  Result Value Ref Range   Lactic Acid, Venous 1.2 0.5 - 2.0 mmol/L  Lactic acid, plasma     Status: None   Collection Time: 03/12/15  3:26 AM  Result Value Ref Range   Lactic Acid, Venous 1.4 0.5 - 2.0 mmol/L  Comprehensive metabolic panel     Status: Abnormal   Collection Time: 03/12/15  3:26 AM  Result Value Ref Range   Sodium 134 (L) 135 - 145 mmol/L   Potassium 3.1 (L) 3.5 - 5.1 mmol/L   Chloride 96 (L) 101 - 111 mmol/L   CO2 26 22 - 32 mmol/L   Glucose, Bld 146 (H) 65 - 99 mg/dL   BUN 15 6 - 20 mg/dL   Creatinine, Ser 1.10 0.61 - 1.24 mg/dL   Calcium 8.5 (L) 8.9 - 10.3 mg/dL   Total Protein 6.5 6.5 - 8.1 g/dL   Albumin 3.0 (L) 3.5 - 5.0 g/dL   AST 14 (L) 15 - 41 U/L   ALT 10 (L) 17 - 63 U/L   Alkaline Phosphatase 54 38 - 126 U/L   Total Bilirubin 1.0 0.3 - 1.2 mg/dL   GFR calc non Af Amer >60 >60 mL/min   GFR calc Af Amer >60 >60 mL/min    Comment: (NOTE) The eGFR has been calculated using the CKD EPI equation. This calculation has not been validated in all clinical situations. eGFR's persistently <60 mL/min signify possible Chronic Kidney Disease.    Anion gap 12 5 - 15  CBC     Status: None   Collection Time: 03/12/15  3:26 AM  Result Value Ref Range   WBC 10.1 4.0 - 10.5 K/uL   RBC 4.76 4.22 - 5.81 MIL/uL   Hemoglobin 14.6 13.0 - 17.0 g/dL   HCT 43.7 39.0 - 52.0 %   MCV 91.8 78.0 - 100.0 fL   MCH 30.7 26.0 - 34.0 pg   MCHC 33.4 30.0 - 36.0 g/dL   RDW 13.3 11.5 - 15.5 %  Platelets 376 150 - 400 K/uL  Glucose, capillary     Status: Abnormal   Collection Time: 03/12/15  7:35 AM  Result Value Ref Range   Glucose-Capillary 106 (H) 65 - 99 mg/dL    Ct Abdomen Pelvis W Contrast  03/11/2015   CLINICAL DATA:  Lower abdominal pain for 1 week.  Constipation.  EXAM: CT ABDOMEN AND PELVIS WITH CONTRAST  TECHNIQUE: Multidetector CT imaging of the abdomen and pelvis was performed using the  standard protocol following bolus administration of intravenous contrast.  CONTRAST:  162m OMNIPAQUE IOHEXOL 300 MG/ML  SOLN  COMPARISON:  Plain films today.  CT 02/27/2015  FINDINGS: Lower chest: Linear subsegmental atelectasis in the lung bases. No effusions. Heart is upper limits normal in size.  Hepatobiliary: Scattered small nonspecific hypodensities throughout the liver, stable. Gallbladder unremarkable. No biliary ductal dilatation.  Pancreas: No focal abnormality or ductal dilatation.  Spleen: No focal abnormality.  Normal size.  Adrenals/Urinary Tract: No focal renal or adrenal abnormality. No hydronephrosis. Urinary bladder is unremarkable.  Stomach/Bowel: The colon is dilated and fluid-filled with air-fluid levels. Stomach and small bowel are decompressed. The colon is dilated to an annular constricting lesion within the sigmoid colon.  Vascular/Lymphatic: No retroperitoneal or mesenteric adenopathy. Aorta normal caliber.  Reproductive: No mass or other significant abnormality.  Other: No free fluid or free air.  Musculoskeletal: Degenerative changes in the lumbar spine. No acute findings.  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 Findings compatible with distal colonic obstructing lesion concerning for malignancy.  Tiny hypodensities scattered throughout the liver, nonspecific, favor small cysts.   Electronically Signed   By: KRolm BaptiseM.D.   On: 03/11/2015 21:17   Dg Abd Acute W/chest  03/11/2015   CLINICAL DATA:  Constipation for 5 days.  Lower abdominal pain.  EXAM: DG ABDOMEN ACUTE W/ 1V CHEST  COMPARISON:  02/27/2015  FINDINGS: Dilated small bowel loops and colon with air-fluid levels. Findings concerning for distal colonic obstruction. No free air. No organomegaly or suspicious calcification.  Bibasilar atelectasis. Heart is upper limits normal in size. No effusions or acute bony abnormality.   IMPRESSION: Dilated small bowel and proximal and mid colon with air-fluid levels compatible with distal colonic obstruction.   Electronically Signed   By: KRolm BaptiseM.D.   On: 03/11/2015 17:51    Review of Systems  Constitutional: Negative.   HENT: Negative.   Eyes: Negative.   Respiratory: Negative.   Cardiovascular: Negative.   Gastrointestinal: Positive for nausea, vomiting, abdominal pain, constipation and blood in stool.  Genitourinary: Negative.   Musculoskeletal: Negative.   Skin: Negative.   Neurological: Negative.   Endo/Heme/Allergies: Negative.   Psychiatric/Behavioral: Negative.    Blood pressure 138/88, pulse 66, temperature 98 F (36.7 C), temperature source Oral, resp. rate 18, height _0  (1.803 m), weight 87.7 kg (193 lb 5.5 oz), SpO2 99 %. Physical Exam  Constitutional: He is oriented to person, place, and time. He appears well-developed and well-nourished.  HENT:  Head: Normocephalic and atraumatic.  Eyes: Conjunctivae and EOM are normal. Pupils are equal, round, and reactive to light.  Neck: Normal range of motion. Neck supple.  Cardiovascular: Normal rate, regular rhythm and normal heart sounds.   Respiratory: Effort normal and breath sounds normal.  GI: He exhibits distension. Bowel sounds are decreased. There is tenderness (only mild diffuse tenderness). There is no rigidity, no rebound and no guarding.  Genitourinary: Penis  normal.  Musculoskeletal: Normal range of motion.  Neurological: He is alert and oriented to person, place, and time. He has normal reflexes.  Psychiatric: He has a normal mood and affect. His behavior is normal. Judgment and thought content normal.    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  Will need surgical intervention with colectomy and colostomy.  I discussed this with the patient and offered to tlak with family members which he declined.  Will go to the  oeprating room soon to avoid potential perforation from the high grade obstruction.  Odilia Damico 03/12/2015, 12:25 PM

## 2015-03-12 NOTE — Progress Notes (Signed)
S: went to evaluate pt post-op. Pt had no acute events and tolerated procedure well. States he is in no pain and having no nausea/vomiting. Pt having no other compliants.   O: VSS General: resting in bed with NG tube secured in place Cardiac: RRR, no rubs, murmurs or gallops Pulm: clear to auscultation bilaterally, moving normal volumes of air Abd: soft, nontender, nondistended, BS quite, colostomy red and moist in LLQ draining some stool, large midabdominal incision nttp dressing with some bloody and serosangionous fluid draining (demarcated by nurse with marker) Ext: warm and well perfused, no pedal edema   A/P: Will continue current management and monitor wound drainage. Nurse updated at bedside.   Clinton Gallant, MD IM PGY-3 Pgr: [phone]

## 2015-03-12 NOTE — Progress Notes (Signed)
Called report to [place], chg rn in holding area.

## 2015-03-12 NOTE — Transfer of Care (Signed)
Immediate Anesthesia Transfer of Care Note  Patient: Greg [name]  Procedure(s) Performed: Procedure(s): EXPLORATORY LAPAROTOMY (N/A)  RESECTION SIGMOID COLON (N/A) COLOSTOMY (N/A)  Patient Location: PACU  Anesthesia Type:General  Level of Consciousness: awake  Airway & Oxygen Therapy: Patient Spontanous Breathing and Patient connected to nasal cannula oxygen  Post-op Assessment: Report given to RN and Post -op Vital signs reviewed and stable  Post vital signs: Reviewed and stable  Last Vitals:  Filed Vitals:   03/12/15 1239  BP: 134/83  Pulse: 67  Temp: 36.8 C  Resp: 18    Complications: No apparent anesthesia complications

## 2015-03-12 NOTE — Anesthesia Postprocedure Evaluation (Signed)
  Anesthesia Post-op Note  Patient: Tour manager  Procedure(s) Performed: Procedure(s): EXPLORATORY LAPAROTOMY (N/A)  RESECTION SIGMOID COLON (N/A) COLOSTOMY (N/A)  Patient Location: PACU  Anesthesia Type:General  Level of Consciousness: awake  Airway and Oxygen Therapy: Patient Spontanous Breathing  Post-op Pain: mild  Post-op Assessment: Post-op Vital signs reviewed  Post-op Vital Signs: Reviewed  Last Vitals:  Filed Vitals:   03/12/15 1601  BP: 143/90  Pulse: 62  Temp:   Resp: 15    Complications: No apparent anesthesia complications

## 2015-03-12 NOTE — Consult Note (Signed)
Referring Provider: Dr. Beryle Beams Primary Care Physician:  Julious Oka, MD Primary Gastroenterologist:  Dr. Paulita Fujita  Reason for Consultation:  Colon mass  HPI: Greg [name] is a 74 y.o. male with 2 weeks of abdominal pain (LLQ), recent onset of constipation and rectal bleeding with a CT showing sigmoid mass with obstruction. Red blood per rectum 2 weeks ago and he is not sure if he has had bleeding before then. N/V yesterday but otherwise no N/V. Denies weight loss, melena, chest pain, shortness of breath, dizziness, or lightheadedness. Seen in the ER 2 weeks ago for abdominal pain and sigmoid lesion seen on CT scan again and patient sent home for outpt workup. Poor prep on attempted colonoscopy in October 2015 and procedure was stopped at the sigmoid colon due to the poor prep (without a lesion seen likely due to the poor prep).  Past Medical History  Diagnosis Date  . Hyperlipidemia   . Hypertension   . Epididymitis     right  . Atony of bladder     followed at [facility]; CIC 3x/day, has been treated for culture proven serratia marcesens 05/2011, 06/2011, 07/2011, 09/2011  . [name] (benign prostatic hyperplasia)     hyposensitive bladder, nodular prostate with increased PSA (10.23 in 03/2006), s/p prostate biopsy 2006 that was normal (Dr Jeffie Pollock) he recommended TURP.  . Sinus bradycardia   . Chronic knee pain     left  . Gingivitis   . Right shoulder pain     sx in 06/2005  . Frozen shoulder     left frozen shoulder  . Shingles     Past Surgical History  Procedure Laterality Date  . Vasectomy    . Hydrocelectomy  11/14/2010    left hydrocelectomy  . Right shoulder arthroscopic surgery  06/2005  . Transurethral resection of prostate      status post TURP 2/2 [name] in 2008  . Knee surgery      L knee    Prior to Admission medications   Medication Sig Start Date End Date Taking? Authorizing Provider  hydrochlorothiazide (HYDRODIURIL) 25 MG tablet Take 1 tablet (25 mg  total) by mouth daily. 09/30/14  Yes Ejiroghene E Emokpae, MD  pravastatin (PRAVACHOL) 20 MG tablet Take 1 tablet (20 mg total) by mouth daily. Patient taking differently: Take 20 mg by mouth daily at 6 PM.  01/28/15  Yes Norman Herrlich, MD  sulfamethoxazole-trimethoprim (BACTRIM DS,SEPTRA DS) 800-160 MG per tablet Take 1 tablet by mouth 2 (two) times daily. 03/01/15  Yes Historical Provider, MD  cephALEXin (KEFLEX) 500 MG capsule Take 1 capsule (500 mg total) by mouth 4 (four) times daily. Patient not taking: Reported on 02/24/2015 11/04/14   Montine Circle, PA-C  cetirizine (ZYRTEC) 10 MG tablet Take 1 tablet (10 mg total) by mouth daily. Patient not taking: Reported on 02/28/2015 09/30/14   Ejiroghene E Denton Brick, MD  fexofenadine (ALLEGRA) 180 MG tablet Take 1 tablet (180 mg total) by mouth daily. Patient not taking: Reported on 02/24/2015 12/15/14   Blain Pais, MD  phenylephrine-shark liver oil-mineral oil-petrolatum (PREPARATION H) 0.25-3-14-71.9 % rectal ointment Place 1 application rectally 2 (two) times daily as needed for hemorrhoids. Patient not taking: Reported on 02/24/2015 06/07/14   Juluis Mire, MD  polyethylene glycol powder (GLYCOLAX/MIRALAX) powder Take 255 g by mouth once. Patient not taking: Reported on 02/28/2015 12/15/14   Blain Pais, MD    Scheduled Meds: . potassium chloride  10 mEq Intravenous Q1 Hr x 4  .  sulfamethoxazole-trimethoprim  160 mg Intravenous Q12H   Continuous Infusions: . sodium chloride 75 mL/hr at 03/12/15 0222  . sodium chloride     PRN Meds:.morphine injection, ondansetron **OR** ondansetron (ZOFRAN) IV  Allergies as of 03/11/2015 - Review Complete 03/11/2015  Allergen Reaction Noted  . Ciprofloxacin hcl Other (See Comments) 08/23/2011  . Ibuprofen Other (See Comments)     No family history on file.  History   Social History  . Marital Status: Single    Spouse Name: N/A  . Number of Children: N/A  . Years of Education: 12    Occupational History  .      retired   Social History Main Topics  . Smoking status: Former Smoker    Types: Cigarettes    Quit date: 02/07/1988  . Smokeless tobacco: Not on file  . Alcohol Use: No  . Drug Use: No  . Sexual Activity: Not on file   Other Topics Concern  . Not on file   Social History Narrative    Review of Systems: All negative except as stated above in HPI.  Physical Exam: Vital signs: Filed Vitals:   03/12/15 0526  BP: 130/84  Pulse: 67  Temp: 99.2 F (37.3 C)  Resp: 16   Last BM Date: 03/10/15 General:  Lethargic, elderly, no acute distress Head: atraumatic Eyes: anicteric sclera, pupils equal and reactive Neck: supple, nontender Throat: oropharynx clear Lungs:  Clear throughout to auscultation.   No wheezes, crackles, or rhonchi. No acute distress. Heart:  Regular rate and rhythm; no murmurs, clicks, rubs,  or gallops. Abdomen: LLQ tenderness with guarding, minimal LUQ tenderness, mild distention, decreased bowel sounds, tympanic percussion  Rectal:  Deferred Ext: no edema Neuro: oriented Skin: no rash  GI:  Lab Results:  Recent Labs  03/11/15 1800 03/11/15 1810 03/12/15 0326  WBC 9.8  --  10.1  HGB 15.2 17.7* 14.6  HCT 45.8 52.0 43.7  PLT 335  --  376   BMET  Recent Labs  03/11/15 1800 03/11/15 1810 03/12/15 0326  NA 135 136 134*  K 3.0* 2.8* 3.1*  CL 95* 95* 96*  CO2 22  --  26  GLUCOSE 78 117* 146*  BUN 15 17 15   CREATININE 1.06 1.00 1.10  CALCIUM 8.8*  --  8.5*   LFT  Recent Labs  03/12/15 0326  PROT 6.5  ALBUMIN 3.0*  AST 14*  ALT 10*  ALKPHOS 54  BILITOT 1.0   PT/INR No results for input(s): LABPROT, INR in the last 72 hours.   Studies/Results: Ct Abdomen Pelvis W Contrast  03/11/2015   CLINICAL DATA:  Lower abdominal pain for 1 week.  Constipation.  EXAM: CT ABDOMEN AND PELVIS WITH CONTRAST  TECHNIQUE: Multidetector CT imaging of the abdomen and pelvis was performed using the standard protocol  following bolus administration of intravenous contrast.  CONTRAST:  158mL OMNIPAQUE IOHEXOL 300 MG/ML  SOLN  COMPARISON:  Plain films today.  CT 02/27/2015  FINDINGS: Lower chest: Linear subsegmental atelectasis in the lung bases. No effusions. Heart is upper limits normal in size.  Hepatobiliary: Scattered small nonspecific hypodensities throughout the liver, stable. Gallbladder unremarkable. No biliary ductal dilatation.  Pancreas: No focal abnormality or ductal dilatation.  Spleen: No focal abnormality.  Normal size.  Adrenals/Urinary Tract: No focal renal or adrenal abnormality. No hydronephrosis. Urinary bladder is unremarkable.  Stomach/Bowel: The colon is dilated and fluid-filled with air-fluid levels. Stomach and small bowel are decompressed. The colon is dilated to an annular constricting  lesion within the sigmoid colon.  Vascular/Lymphatic: No retroperitoneal or mesenteric adenopathy. Aorta normal caliber.  Reproductive: No mass or other significant abnormality.  Other: No free fluid or free air.  Musculoskeletal: Degenerative changes in the lumbar spine. No acute findings.  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 Findings compatible with distal colonic obstructing lesion concerning for malignancy.  Tiny hypodensities scattered throughout the liver, nonspecific, favor small cysts.   Electronically Signed   By: Rolm Baptise M.D.   On: 03/11/2015 21:17   Dg Abd Acute W/chest  03/11/2015   CLINICAL DATA:  Constipation for 5 days.  Lower abdominal pain.  EXAM: DG ABDOMEN ACUTE W/ 1V CHEST  COMPARISON:  02/27/2015  FINDINGS: Dilated small bowel loops and colon with air-fluid levels. Findings concerning for distal colonic obstruction. No free air. No organomegaly or suspicious calcification.  Bibasilar atelectasis. Heart is upper limits normal in size. No effusions or acute bony abnormality.  IMPRESSION:  Dilated small bowel and proximal and mid colon with air-fluid levels compatible with distal colonic obstruction.   Electronically Signed   By: Rolm Baptise M.D.   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 If unable to pass colonoscope proximal to the mass then will need urgent surgery. Unprepped colonoscopy to be done this morning. Risks/benefits discussed with patient and he agrees to proceed.    LOS: 0 days   Jewell C.  03/12/2015, 9:36 AM  Pager [phone]  If no answer or after 5 PM call [phone]

## 2015-03-12 NOTE — H&P (View-Only) (Signed)
Referring Provider: Dr. Beryle Beams Primary Care Physician:  Julious Oka, MD Primary Gastroenterologist:  Dr. Paulita Fujita  Reason for Consultation:  Colon mass  HPI: Greg [name] is a 74 y.o. male with 2 weeks of abdominal pain (LLQ), recent onset of constipation and rectal bleeding with a CT showing sigmoid mass with obstruction. Red blood per rectum 2 weeks ago and he is not sure if he has had bleeding before then. N/V yesterday but otherwise no N/V. Denies weight loss, melena, chest pain, shortness of breath, dizziness, or lightheadedness. Seen in the ER 2 weeks ago for abdominal pain and sigmoid lesion seen on CT scan again and patient sent home for outpt workup. Poor prep on attempted colonoscopy in October 2015 and procedure was stopped at the sigmoid colon due to the poor prep (without a lesion seen likely due to the poor prep).  Past Medical History  Diagnosis Date  . Hyperlipidemia   . Hypertension   . Epididymitis     right  . Atony of bladder     followed at [facility]; CIC 3x/day, has been treated for culture proven serratia marcesens 05/2011, 06/2011, 07/2011, 09/2011  . [name] (benign prostatic hyperplasia)     hyposensitive bladder, nodular prostate with increased PSA (10.23 in 03/2006), s/p prostate biopsy 2006 that was normal (Dr Jeffie Pollock) he recommended TURP.  . Sinus bradycardia   . Chronic knee pain     left  . Gingivitis   . Right shoulder pain     sx in 06/2005  . Frozen shoulder     left frozen shoulder  . Shingles     Past Surgical History  Procedure Laterality Date  . Vasectomy    . Hydrocelectomy  11/14/2010    left hydrocelectomy  . Right shoulder arthroscopic surgery  06/2005  . Transurethral resection of prostate      status post TURP 2/2 [name] in 2008  . Knee surgery      L knee    Prior to Admission medications   Medication Sig Start Date End Date Taking? Authorizing Provider  hydrochlorothiazide (HYDRODIURIL) 25 MG tablet Take 1 tablet (25 mg  total) by mouth daily. 09/30/14  Yes Ejiroghene E Emokpae, MD  pravastatin (PRAVACHOL) 20 MG tablet Take 1 tablet (20 mg total) by mouth daily. Patient taking differently: Take 20 mg by mouth daily at 6 PM.  01/28/15  Yes Norman Herrlich, MD  sulfamethoxazole-trimethoprim (BACTRIM DS,SEPTRA DS) 800-160 MG per tablet Take 1 tablet by mouth 2 (two) times daily. 03/01/15  Yes Historical Provider, MD  cephALEXin (KEFLEX) 500 MG capsule Take 1 capsule (500 mg total) by mouth 4 (four) times daily. Patient not taking: Reported on 02/24/2015 11/04/14   Montine Circle, PA-C  cetirizine (ZYRTEC) 10 MG tablet Take 1 tablet (10 mg total) by mouth daily. Patient not taking: Reported on 02/28/2015 09/30/14   Ejiroghene E Denton Brick, MD  fexofenadine (ALLEGRA) 180 MG tablet Take 1 tablet (180 mg total) by mouth daily. Patient not taking: Reported on 02/24/2015 12/15/14   Blain Pais, MD  phenylephrine-shark liver oil-mineral oil-petrolatum (PREPARATION H) 0.25-3-14-71.9 % rectal ointment Place 1 application rectally 2 (two) times daily as needed for hemorrhoids. Patient not taking: Reported on 02/24/2015 06/07/14   Juluis Mire, MD  polyethylene glycol powder (GLYCOLAX/MIRALAX) powder Take 255 g by mouth once. Patient not taking: Reported on 02/28/2015 12/15/14   Blain Pais, MD    Scheduled Meds: . potassium chloride  10 mEq Intravenous Q1 Hr x 4  .  sulfamethoxazole-trimethoprim  160 mg Intravenous Q12H   Continuous Infusions: . sodium chloride 75 mL/hr at 03/12/15 0222  . sodium chloride     PRN Meds:.morphine injection, ondansetron **OR** ondansetron (ZOFRAN) IV  Allergies as of 03/11/2015 - Review Complete 03/11/2015  Allergen Reaction Noted  . Ciprofloxacin hcl Other (See Comments) 08/23/2011  . Ibuprofen Other (See Comments)     No family history on file.  History   Social History  . Marital Status: Single    Spouse Name: N/A  . Number of Children: N/A  . Years of Education: 12    Occupational History  .      retired   Social History Main Topics  . Smoking status: Former Smoker    Types: Cigarettes    Quit date: 02/07/1988  . Smokeless tobacco: Not on file  . Alcohol Use: No  . Drug Use: No  . Sexual Activity: Not on file   Other Topics Concern  . Not on file   Social History Narrative    Review of Systems: All negative except as stated above in HPI.  Physical Exam: Vital signs: Filed Vitals:   03/12/15 0526  BP: 130/84  Pulse: 67  Temp: 99.2 F (37.3 C)  Resp: 16   Last BM Date: 03/10/15 General:  Lethargic, elderly, no acute distress Head: atraumatic Eyes: anicteric sclera, pupils equal and reactive Neck: supple, nontender Throat: oropharynx clear Lungs:  Clear throughout to auscultation.   No wheezes, crackles, or rhonchi. No acute distress. Heart:  Regular rate and rhythm; no murmurs, clicks, rubs,  or gallops. Abdomen: LLQ tenderness with guarding, minimal LUQ tenderness, mild distention, decreased bowel sounds, tympanic percussion  Rectal:  Deferred Ext: no edema Neuro: oriented Skin: no rash  GI:  Lab Results:  Recent Labs  03/11/15 1800 03/11/15 1810 03/12/15 0326  WBC 9.8  --  10.1  HGB 15.2 17.7* 14.6  HCT 45.8 52.0 43.7  PLT 335  --  376   BMET  Recent Labs  03/11/15 1800 03/11/15 1810 03/12/15 0326  NA 135 136 134*  K 3.0* 2.8* 3.1*  CL 95* 95* 96*  CO2 22  --  26  GLUCOSE 78 117* 146*  BUN 15 17 15   CREATININE 1.06 1.00 1.10  CALCIUM 8.8*  --  8.5*   LFT  Recent Labs  03/12/15 0326  PROT 6.5  ALBUMIN 3.0*  AST 14*  ALT 10*  ALKPHOS 54  BILITOT 1.0   PT/INR No results for input(s): LABPROT, INR in the last 72 hours.   Studies/Results: Ct Abdomen Pelvis W Contrast  03/11/2015   CLINICAL DATA:  Lower abdominal pain for 1 week.  Constipation.  EXAM: CT ABDOMEN AND PELVIS WITH CONTRAST  TECHNIQUE: Multidetector CT imaging of the abdomen and pelvis was performed using the standard protocol  following bolus administration of intravenous contrast.  CONTRAST:  168mL OMNIPAQUE IOHEXOL 300 MG/ML  SOLN  COMPARISON:  Plain films today.  CT 02/27/2015  FINDINGS: Lower chest: Linear subsegmental atelectasis in the lung bases. No effusions. Heart is upper limits normal in size.  Hepatobiliary: Scattered small nonspecific hypodensities throughout the liver, stable. Gallbladder unremarkable. No biliary ductal dilatation.  Pancreas: No focal abnormality or ductal dilatation.  Spleen: No focal abnormality.  Normal size.  Adrenals/Urinary Tract: No focal renal or adrenal abnormality. No hydronephrosis. Urinary bladder is unremarkable.  Stomach/Bowel: The colon is dilated and fluid-filled with air-fluid levels. Stomach and small bowel are decompressed. The colon is dilated to an annular constricting  lesion within the sigmoid colon.  Vascular/Lymphatic: No retroperitoneal or mesenteric adenopathy. Aorta normal caliber.  Reproductive: No mass or other significant abnormality.  Other: No free fluid or free air.  Musculoskeletal: Degenerative changes in the lumbar spine. No acute findings.  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 Findings compatible with distal colonic obstructing lesion concerning for malignancy.  Tiny hypodensities scattered throughout the liver, nonspecific, favor small cysts.   Electronically Signed   By: Rolm Baptise M.D.   On: 03/11/2015 21:17   Dg Abd Acute W/chest  03/11/2015   CLINICAL DATA:  Constipation for 5 days.  Lower abdominal pain.  EXAM: DG ABDOMEN ACUTE W/ 1V CHEST  COMPARISON:  02/27/2015  FINDINGS: Dilated small bowel loops and colon with air-fluid levels. Findings concerning for distal colonic obstruction. No free air. No organomegaly or suspicious calcification.  Bibasilar atelectasis. Heart is upper limits normal in size. No effusions or acute bony abnormality.  IMPRESSION:  Dilated small bowel and proximal and mid colon with air-fluid levels compatible with distal colonic obstruction.   Electronically Signed   By: Rolm Baptise M.D.   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 If unable to pass colonoscope proximal to the mass then will need urgent surgery. Unprepped colonoscopy to be done this morning. Risks/benefits discussed with patient and he agrees to proceed.    LOS: 0 days   Foster C.  03/12/2015, 9:36 AM  Pager [phone]  If no answer or after 5 PM call [phone]

## 2015-03-12 NOTE — Anesthesia Preprocedure Evaluation (Addendum)
Anesthesia Evaluation  Patient identified by MRN, date of birth, ID band Patient awake    Reviewed: Allergy & Precautions, NPO status , Patient's Chart, lab work & pertinent test results  Airway Mallampati: II  TM Distance: >3 FB Neck ROM: Full    Dental   Pulmonary former smoker,  breath sounds clear to auscultation        Cardiovascular hypertension, Rhythm:Regular Rate:Normal     Neuro/Psych    GI/Hepatic Neg liver ROS, GI history noted. CE   Endo/Other  negative endocrine ROS  Renal/GU negative Renal ROS     Musculoskeletal  (+) Arthritis -,   Abdominal   Peds  Hematology   Anesthesia Other Findings   Reproductive/Obstetrics                            Anesthesia Physical Anesthesia Plan  ASA: III  Anesthesia Plan: General   Post-op Pain Management:    Induction: Intravenous  Airway Management Planned: Oral ETT  Additional Equipment:   Intra-op Plan:   Post-operative Plan: Possible Post-op intubation/ventilation  Informed Consent: I have reviewed the patients History and Physical, chart, labs and discussed the procedure including the risks, benefits and alternatives for the proposed anesthesia with the patient or authorized representative who has indicated his/her understanding and acceptance.   Dental advisory given  Plan Discussed with: CRNA and Anesthesiologist  Anesthesia Plan Comments:         Anesthesia Quick Evaluation

## 2015-03-13 LAB — CBC
HEMATOCRIT: 41.4 % (ref 39.0–52.0)
Hemoglobin: 14 g/dL (ref 13.0–17.0)
MCH: 30.6 pg (ref 26.0–34.0)
MCHC: 33.8 g/dL (ref 30.0–36.0)
MCV: 90.6 fL (ref 78.0–100.0)
Platelets: 305 10*3/uL (ref 150–400)
RBC: 4.57 MIL/uL (ref 4.22–5.81)
RDW: 13.3 % (ref 11.5–15.5)
WBC: 11.4 10*3/uL — AB (ref 4.0–10.5)

## 2015-03-13 LAB — BASIC METABOLIC PANEL
ANION GAP: 8 (ref 5–15)
Anion gap: 5 (ref 5–15)
BUN: 13 mg/dL (ref 6–20)
BUN: 10 mg/dL (ref 6–20)
CHLORIDE: 98 mmol/L — AB (ref 101–111)
CHLORIDE: 97 mmol/L — AB (ref 101–111)
CO2: 27 mmol/L (ref 22–32)
CO2: 32 mmol/L (ref 22–32)
CREATININE: 1.02 mg/dL (ref 0.61–1.24)
Calcium: 7.9 mg/dL — ABNORMAL LOW (ref 8.9–10.3)
Calcium: 7.8 mg/dL — ABNORMAL LOW (ref 8.9–10.3)
Creatinine, Ser: 0.95 mg/dL (ref 0.61–1.24)
GFR calc Af Amer: >60 mL/min (ref >60)
GFR calc non Af Amer: >60 mL/min (ref >60)
GFR calc non Af Amer: >60 mL/min (ref >60)
GLUCOSE: 164 mg/dL — AB (ref 65–99)
GLUCOSE: 139 mg/dL — AB (ref 65–99)
POTASSIUM: 2.6 mmol/L — AB (ref 3.5–5.1)
POTASSIUM: 3.6 mmol/L (ref 3.5–5.1)
SODIUM: 134 mmol/L — AB (ref 135–145)
Sodium: 133 mmol/L — ABNORMAL LOW (ref 135–145)

## 2015-03-13 LAB — GLUCOSE, CAPILLARY: Glucose-Capillary: 129 mg/dL — ABNORMAL HIGH (ref 65–99)

## 2015-03-13 LAB — CEA: CEA: 2.5 ng/mL (ref 0.0–4.7)

## 2015-03-13 MED ORDER — CETYLPYRIDINIUM CHLORIDE 0.05 % MT LIQD
7.0000 mL | Freq: Two times a day (BID) | OROMUCOSAL | Status: DC
  Administered 2015-03-13 – 2015-03-18 (×11): 7 mL via OROMUCOSAL

## 2015-03-13 MED ORDER — ACETAMINOPHEN 10 MG/ML IV SOLN: 1000.0000 mg | Freq: Four times a day (QID) | INTRAVENOUS | Status: AC | PRN

## 2015-03-13 MED ORDER — MORPHINE SULFATE 2 MG/ML IJ SOLN
2.0000 mg | INTRAMUSCULAR | Status: DC | PRN
Start: 2015-03-13 — End: 2015-03-18
  Administered 2015-03-13 – 2015-03-16 (×4): 2 mg via INTRAVENOUS
  Filled 2015-03-13 (×4): qty 1

## 2015-03-13 MED ORDER — POTASSIUM CHLORIDE 10 MEQ/100ML IV SOLN
10.0000 meq | INTRAVENOUS | Status: AC
  Administered 2015-03-13 (×4): 10 meq via INTRAVENOUS
  Filled 2015-03-13 (×4): qty 100

## 2015-03-13 NOTE — Progress Notes (Signed)
PT Cancellation Note  Patient Details Name: Greg [name] MRN: [id number] DOB: 1941/04/12   Cancelled Treatment:    Reason Eval/Treat Not Completed: Other (comment)   Pt politely declined PT as he had been up all morning;  Will follow up later today as time allows;  Otherwise, will follow up for PT tomorrow;   Thank you,  Roney Marion, [place] Pager [phone] Office [phone]     Roney Marion [facility] 03/13/2015, 11:17 AM

## 2015-03-13 NOTE — Progress Notes (Signed)
Patient ID: Greg [name], male   DOB: [birth year], 74 y.o.   MRN: [id number] Medicine Attending: We greatly appreciate prompt surgical intervention. Sigmoid lesion resected; no gross metastases Stable P.O. Day 1. T max 99.5 on Cefotetan Lungs clear, cor reg, abd soft, midline incision, LLQ colostomy; no calf tenderness. K 2.6; IV replacement in progress Await path. If stage III, outpatient referral to Oncology when he recovers from surgery.

## 2015-03-13 NOTE — Progress Notes (Signed)
Patient ID: Greg [name], male   DOB: Dec 28, 1940, 74 y.o.   MRN: [id number]  Pt. s/p bowel resection and colostomy for sigmoid colon mass. Patient doing ok on IV pain meds. Appreciate surgery's help and management. Will sign off. Please call us back if needed.

## 2015-03-13 NOTE — Progress Notes (Signed)
Subjective: Patient s/p colectomy/colostomy yesterday (03/12/15). Pain controlled, states he feels fine. NGT in place. Colostomy leaked this AM, in the process of having it cleaned up. Afebrile overnight.   Objective: Vital signs in last 24 hours: Filed Vitals:   03/13/15 0133 03/13/15 0336 03/13/15 0630 03/13/15 0803  BP: 134/91  132/85   Pulse: 66  63   Temp: 99.5 F (37.5 C)  99.2 F (37.3 C)   TempSrc: Oral  Oral   Resp: 18 19 16 14   Height:      Weight:   200 lb 6.4 oz (90.9 kg)   SpO2: 99% 98% 98% 98%   Weight change: 5 lb 6.4 oz (2.449 kg)  Intake/Output Summary (Last 24 hours) at 03/13/15 0809 Last data filed at 03/13/15 0600  Gross per 24 hour  Intake   3000 ml  Output   2400 ml  Net    600 ml   Physical Exam: General: AA male, alert, cooperative, NAD. NGT, O2 via College in place.  HEENT: PERRL, EOMI. Moist mucus membranes Neck: Full range of motion without pain, supple, no lymphadenopathy or carotid bruits Lungs: Clear to ascultation bilaterally, normal work of respiration, no wheezes, rales, rhonchi Heart: RRR, no murmurs, gallops, or rubs Abdomen: Soft, tender, only mildly distended, midline dressing, colostomy in place.  Extremities: No cyanosis, clubbing, or edema Neurologic: Alert & oriented x3, cranial nerves II-XII intact, strength grossly intact, sensation intact to light touch   Lab Results: Basic Metabolic Panel:  Recent Labs Lab 03/12/15 0027 03/12/15 0326 03/13/15 0505  NA  --  134* 133*  K  --  3.1* 2.6*  CL  --  96* 98*  CO2  --  26 27  GLUCOSE  --  146* 164*  BUN  --  15 13  CREATININE  --  1.10 0.95  CALCIUM  --  8.5* 7.9*  MG 2.3  --   --    Liver Function Tests:  Recent Labs Lab 03/12/15 0326  AST 14*  ALT 10*  ALKPHOS 54  BILITOT 1.0  PROT 6.5  ALBUMIN 3.0*   CBC:  Recent Labs Lab 03/11/15 1800  03/12/15 0326 03/13/15 0505  WBC 9.8  --  10.1 11.4*  NEUTROABS 8.4*  --   --   --   HGB 15.2  < > 14.6 14.0  HCT 45.8   < > 43.7 41.4  MCV 93.1  --  91.8 90.6  PLT 335  --  376 305  < > = values in this interval not displayed. CBG:  Recent Labs Lab 03/12/15 0735 03/13/15 0756  GLUCAP 106* 129*   Studies/Results: Ct Abdomen Pelvis W Contrast  03/11/2015   CLINICAL DATA:  Lower abdominal pain for 1 week.  Constipation.  EXAM: CT ABDOMEN AND PELVIS WITH CONTRAST  TECHNIQUE: Multidetector CT imaging of the abdomen and pelvis was performed using the standard protocol following bolus administration of intravenous contrast.  CONTRAST:  117mL OMNIPAQUE IOHEXOL 300 MG/ML  SOLN  COMPARISON:  Plain films today.  CT 02/27/2015  FINDINGS: Lower chest: Linear subsegmental atelectasis in the lung bases. No effusions. Heart is upper limits normal in size.  Hepatobiliary: Scattered small nonspecific hypodensities throughout the liver, stable. Gallbladder unremarkable. No biliary ductal dilatation.  Pancreas: No focal abnormality or ductal dilatation.  Spleen: No focal abnormality.  Normal size.  Adrenals/Urinary Tract: No focal renal or adrenal abnormality. No hydronephrosis. Urinary bladder is unremarkable.  Stomach/Bowel: The colon is dilated and fluid-filled with air-fluid  levels. Stomach and small bowel are decompressed. The colon is dilated to an annular constricting lesion within the sigmoid colon.  Vascular/Lymphatic: No retroperitoneal or mesenteric adenopathy. Aorta normal caliber.  Reproductive: No mass or other significant abnormality.  Other: No free fluid or free air.  Musculoskeletal: Degenerative changes in the lumbar spine. No acute findings.  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 Findings compatible with distal colonic obstructing lesion concerning for malignancy.  Tiny hypodensities scattered throughout the liver, nonspecific, favor small cysts.   Electronically Signed   By: Rolm Baptise M.D.   On: 03/11/2015 21:17    Dg Abd Acute W/chest  03/11/2015   CLINICAL DATA:  Constipation for 5 days.  Lower abdominal pain.  EXAM: DG ABDOMEN ACUTE W/ 1V CHEST  COMPARISON:  02/27/2015  FINDINGS: Dilated small bowel loops and colon with air-fluid levels. Findings concerning for distal colonic obstruction. No free air. No organomegaly or suspicious calcification.  Bibasilar atelectasis. Heart is upper limits normal in size. No effusions or acute bony abnormality.  IMPRESSION: Dilated small bowel and proximal and mid colon with air-fluid levels compatible with distal colonic obstruction.   Electronically Signed   By: Rolm Baptise M.D.   On: 03/11/2015 17:51   Medications: I have reviewed the patient's current medications. Scheduled Meds: . enoxaparin (LOVENOX) injection  40 mg Subcutaneous Q24H  . HYDROmorphone PCA 0.3 mg/mL   Intravenous 6 times per day  . potassium chloride  10 mEq Intravenous Q1 Hr x 4   Continuous Infusions: . dextrose 5 % and 0.45 % NaCl with KCl 10 mEq/L 125 mL/hr at 03/13/15 0144   PRN Meds:.diphenhydrAMINE **OR** diphenhydrAMINE, naloxone **AND** sodium chloride, ondansetron (ZOFRAN) IV, ondansetron **OR** [DISCONTINUED] ondansetron (ZOFRAN) IV   Assessment/Plan: 74 y/o M w/ PMHx of HTN, HLD, [name] w/ atrophic bladder, admitted for obstructive colonic mass.   Colon Mass w/ Obstruction: Presented overnight on 03/11/15 w/ obstipation, abdominal pain, and vomiting. CT on admission shows an annular constricting lesion within the sigmoid colon with markedly dilated colon proximal to this lesion with air-fluid levels. Findings compatible with distal colonic obstructing lesion concerning for malignancy. Taken to OR yesterday (03/13/15) by Dr. Hulen Skains for colectomy + colostomy, no apparent complications. Patient states his pain is well controlled today, although he seems to not complain about much in general. On Dilaudid PCA pump but has only used 0.3 x2 since yesterday. Colostomy leaked this AM soiled gown, RN  was in the process of cleaning up during patient visit.  -Continued management per surgery -Defer pain control to surgery team, although patient would probably tolerate Dilaudid 0.5 mg q2-q4h prn  Hypokalemia: K 2.6 this AM.  -Supplement w/ IV K -Repeat BMP in PM  HTN: BP stable.  -BP meds on hold for now  Atrophic Bladder: Self-cath per patient. Follows w/ [facility]. Takes bactrim chronically at home, discontinued yesterday prior to surgery.  -RN to assist w/ self-cath -Restart Bactrim when able to take po  DVT/PE PPx: SCD's  Dispo: Disposition is deferred at this time, awaiting improvement of current medical problems.  Anticipated discharge in approximately 3-4 day(s).   The patient does have a current PCP Norman Herrlich, MD) and does need an [facility] hospital follow-up appointment after discharge.  The patient does not have transportation limitations that hinder transportation to clinic appointments.  .Services Needed at time of discharge: Y = Yes, Blank = No PT:   OT:  RN:   Equipment:   Other:     LOS: 1 day   Corky Sox, MD 03/13/2015, 8:09 AM

## 2015-03-13 NOTE — Progress Notes (Signed)
1 Day Post-Op  Subjective: Doing well, pca beeping, bag fell off last night and soiled incision  Objective: Vital signs in last 24 hours: Temp:  [97.4 F (36.3 C)-99.5 F (37.5 C)] 99.2 F (37.3 C) (05/22 0630) Pulse Rate:  [51-70] 63 (05/22 0630) Resp:  [14-20] 14 (05/22 0803) BP: (111-151)/(60-91) 132/85 mmHg (05/22 0630) SpO2:  [93 %-100 %] 98 % (05/22 0803) FiO2 (%):  [36 %] 36 % (05/21 2000) Weight:  [90.9 kg (200 lb 6.4 oz)] 90.9 kg (200 lb 6.4 oz) (05/22 0630) Last BM Date: 03/12/15 (ostomy has begun putting out)  Intake/Output from previous day: 05/21 0701 - 05/22 0700 In: 3000 [I.V.:3000] Out: 2400 [Urine:2000; Emesis/NG output:100; Stool:200; Blood:100] Intake/Output this shift:    General appearance: no distress Resp: clear to auscultation bilaterally Cardio: regular rate and rhythm GI: approp tender stoma pink no bs  Lab Results:   Recent Labs  03/12/15 0326 03/13/15 0505  WBC 10.1 11.4*  HGB 14.6 14.0  HCT 43.7 41.4  PLT 376 305   BMET  Recent Labs  03/12/15 0326 03/13/15 0505  NA 134* 133*  K 3.1* 2.6*  CL 96* 98*  CO2 26 27  GLUCOSE 146* 164*  BUN 15 13  CREATININE 1.10 0.95  CALCIUM 8.5* 7.9*   PT/INR No results for input(s): LABPROT, INR in the last 72 hours. ABG No results for input(s): PHART, HCO3 in the last 72 hours.  Invalid input(s): PCO2, PO2  Studies/Results: Ct Abdomen Pelvis W Contrast  03/11/2015   CLINICAL DATA:  Lower abdominal pain for 1 week.  Constipation.  EXAM: CT ABDOMEN AND PELVIS WITH CONTRAST  TECHNIQUE: Multidetector CT imaging of the abdomen and pelvis was performed using the standard protocol following bolus administration of intravenous contrast.  CONTRAST:  11mL OMNIPAQUE IOHEXOL 300 MG/ML  SOLN  COMPARISON:  Plain films today.  CT 02/27/2015  FINDINGS: Lower chest: Linear subsegmental atelectasis in the lung bases. No effusions. Heart is upper limits normal in size.  Hepatobiliary: Scattered small  nonspecific hypodensities throughout the liver, stable. Gallbladder unremarkable. No biliary ductal dilatation.  Pancreas: No focal abnormality or ductal dilatation.  Spleen: No focal abnormality.  Normal size.  Adrenals/Urinary Tract: No focal renal or adrenal abnormality. No hydronephrosis. Urinary bladder is unremarkable.  Stomach/Bowel: The colon is dilated and fluid-filled with air-fluid levels. Stomach and small bowel are decompressed. The colon is dilated to an annular constricting lesion within the sigmoid colon.  Vascular/Lymphatic: No retroperitoneal or mesenteric adenopathy. Aorta normal caliber.  Reproductive: No mass or other significant abnormality.  Other: No free fluid or free air.  Musculoskeletal: Degenerative changes in the lumbar spine. No acute findings.  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 Findings compatible with distal colonic obstructing lesion concerning for malignancy.  Tiny hypodensities scattered throughout the liver, nonspecific, favor small cysts.   Electronically Signed   By: Rolm Baptise M.D.   On: 03/11/2015 21:17   Dg Abd Acute W/chest  03/11/2015   CLINICAL DATA:  Constipation for 5 days.  Lower abdominal pain.  EXAM: DG ABDOMEN ACUTE W/ 1V CHEST  COMPARISON:  02/27/2015  FINDINGS: Dilated small bowel loops and colon with air-fluid levels. Findings concerning for distal colonic obstruction. No free air. No organomegaly or suspicious calcification.  Bibasilar atelectasis. Heart is upper limits normal in size. No effusions or acute bony abnormality.  IMPRESSION: Dilated small bowel and proximal and mid colon with air-fluid  levels compatible with distal colonic obstruction.   Electronically Signed   By: Rolm Baptise M.D.   On: 03/11/2015 17:51    Anti-infectives: Anti-infectives    Start     Dose/Rate Route Frequency Ordered Stop   03/13/15 0100  cefoTEtan (CEFOTAN) 2 g in  dextrose 5 % 50 mL IVPB     2 g 100 mL/hr over 30 Minutes Intravenous Every 12 hours 03/12/15 1632 03/13/15 0122   03/12/15 1230  cefoTEtan (CEFOTAN) 2 g in dextrose 5 % 50 mL IVPB     2 g 100 mL/hr over 30 Minutes Intravenous On call to O.R. 03/12/15 1221 03/12/15 1450   03/12/15 0330  [MAR Hold]  sulfamethoxazole-trimethoprim (BACTRIM) 160 mg in dextrose 5 % 250 mL IVPB  Status:  Discontinued     (MAR Hold since 03/12/15 1259)   160 mg 260 mL/hr over 60 Minutes Intravenous Every 12 hours 03/12/15 0227 03/12/15 1632      Assessment/Plan: Pod 1 sigmoid colectomy/colostomy  1. Dc pca he is not using much and is beeping, will give iv push morphine and prn ofirmev 2. pulm toilet, oob 3. Clamp ng and if tolerates remove later, nothing coming out, leave npo 4. Replace k 5. lovenox scds 6 await path  [facility] 03/13/2015

## 2015-03-13 NOTE — Progress Notes (Signed)
CRITICAL VALUE ALERT  Critical value received:   K 2.6   Date of notification:  5/22  Time of notification:  0620  Critical value read back: yes  Nurse who received alert:  Anderson Malta  MD notified (1st page):  [phone]  Time of first page:  0625  MD notified (2nd page):   Time of second page:  Responding MD:  Dr. Sherrine Maples   Time MD responded:  [phone] Responded with orders to start iv potassium

## 2015-03-13 NOTE — Consult Note (Signed)
WOC ostomy consult note Stoma type/location: LLQ, end colostomy Stomal assessment/size: aprox 1 3/4" stoma, appears budded with some stomal slough at the proximal stomal edges, visualized through the pouch  Peristomal assessment: did not change pouch today, changed per bedside nurse this am, overfilled and ruptured Treatment options for stomal/peristomal skin: NA Output liquid brown stool  Ostomy pouching: 1pc in place  Education provided: explained rationale for ostomy creation, lives at home alone.  May need short term HHRN to assist with continue support for ostomy care.  Will begin with ostomy pouch change in the am.  Pt agreeable.  Enrolled patient in [place] program: No Ordered 2 Pc pouches and barrier rings to the bedside, nurse reports due to overfilling but also noted slight dip in the skin at 11 oclock. May need to add barrier ring at that site.   WOC will follow along for continue ostomy support and teaching. Kayleanna Lorman [place] RN,CWOCN [phone]

## 2015-03-14 DIAGNOSIS — K5669 Other intestinal obstruction: Secondary | ICD-10-CM

## 2015-03-14 DIAGNOSIS — Z933 Colostomy status: Secondary | ICD-10-CM

## 2015-03-14 DIAGNOSIS — N3289 Other specified disorders of bladder: Secondary | ICD-10-CM

## 2015-03-14 DIAGNOSIS — Z9049 Acquired absence of other specified parts of digestive tract: Secondary | ICD-10-CM

## 2015-03-14 DIAGNOSIS — E876 Hypokalemia: Secondary | ICD-10-CM

## 2015-03-14 DIAGNOSIS — I10 Essential (primary) hypertension: Secondary | ICD-10-CM

## 2015-03-14 LAB — CBC
HCT: 38.3 % — ABNORMAL LOW (ref 39.0–52.0)
Hemoglobin: 12.9 g/dL — ABNORMAL LOW (ref 13.0–17.0)
MCH: 30.5 pg (ref 26.0–34.0)
MCHC: 33.7 g/dL (ref 30.0–36.0)
MCV: 90.5 fL (ref 78.0–100.0)
PLATELETS: 304 10*3/uL (ref 150–400)
RBC: 4.23 MIL/uL (ref 4.22–5.81)
RDW: 13.2 % (ref 11.5–15.5)
WBC: 10.1 10*3/uL (ref 4.0–10.5)

## 2015-03-14 LAB — BASIC METABOLIC PANEL
Anion gap: 6 (ref 5–15)
Anion gap: 5 (ref 5–15)
BUN: 7 mg/dL (ref 6–20)
BUN: 6 mg/dL (ref 6–20)
CALCIUM: 8 mg/dL — AB (ref 8.9–10.3)
CHLORIDE: 96 mmol/L — AB (ref 101–111)
CO2: 32 mmol/L (ref 22–32)
CO2: 36 mmol/L — AB (ref 22–32)
Calcium: 7.6 mg/dL — ABNORMAL LOW (ref 8.9–10.3)
Chloride: 93 mmol/L — ABNORMAL LOW (ref 101–111)
Creatinine, Ser: 0.79 mg/dL (ref 0.61–1.24)
Creatinine, Ser: 0.79 mg/dL (ref 0.61–1.24)
GFR calc Af Amer: >60 mL/min (ref >60)
GFR calc non Af Amer: >60 mL/min (ref >60)
GFR calc non Af Amer: >60 mL/min (ref >60)
Glucose, Bld: 129 mg/dL — ABNORMAL HIGH (ref 65–99)
Glucose, Bld: 119 mg/dL — ABNORMAL HIGH (ref 65–99)
POTASSIUM: 2.5 mmol/L — AB (ref 3.5–5.1)
POTASSIUM: 3.5 mmol/L (ref 3.5–5.1)
SODIUM: 134 mmol/L — AB (ref 135–145)
SODIUM: 134 mmol/L — AB (ref 135–145)

## 2015-03-14 LAB — GLUCOSE, CAPILLARY: Glucose-Capillary: 102 mg/dL — ABNORMAL HIGH (ref 65–99)

## 2015-03-14 LAB — MAGNESIUM: Magnesium: 1.8 mg/dL (ref 1.7–2.4)

## 2015-03-14 MED ORDER — POTASSIUM CHLORIDE 10 MEQ/100ML IV SOLN
10.0000 meq | INTRAVENOUS | Status: AC
  Administered 2015-03-14 (×3): 10 meq via INTRAVENOUS
  Filled 2015-03-14 (×2): qty 100

## 2015-03-14 MED ORDER — POTASSIUM CHLORIDE 10 MEQ/100ML IV SOLN
10.0000 meq | INTRAVENOUS | Status: AC
  Administered 2015-03-14 (×5): 10 meq via INTRAVENOUS
  Filled 2015-03-14 (×7): qty 100

## 2015-03-14 MED ORDER — MAGNESIUM SULFATE IN D5W 10-5 MG/ML-% IV SOLN
1.0000 g | Freq: Once | INTRAVENOUS | Status: AC
  Administered 2015-03-14: 1 g via INTRAVENOUS
  Filled 2015-03-14: qty 100

## 2015-03-14 NOTE — Progress Notes (Signed)
Subjective: Doing better today, NGT removed yesterday, states he is somewhat hungry today. Pain is controlled.   Objective: Vital signs in last 24 hours: Filed Vitals:   03/13/15 0803 03/13/15 1255 03/13/15 2249 03/14/15 0618  BP:  138/82 136/84 130/81  Pulse:  89 69 64  Temp:  98.8 F (37.1 C) 99.1 F (37.3 C) 98.7 F (37.1 C)  TempSrc:  Oral Oral Oral  Resp: 14 16 16 15   Height:      Weight:    196 lb 6.9 oz (89.1 kg)  SpO2: 98% 96% 96% 97%   Weight change: -3 lb 15.5 oz (-1.8 kg)  Intake/Output Summary (Last 24 hours) at 03/14/15 1204 Last data filed at 03/14/15 1150  Gross per 24 hour  Intake   2847 ml  Output   3150 ml  Net   -303 ml   Physical Exam: General: AA male, alert, cooperative, NAD.  HEENT: PERRL, EOMI. 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  Extremities: No cyanosis, clubbing, or edema Neurologic: Alert & oriented x3, cranial nerves II-XII intact, strength grossly intact, sensation intact to light touch   Lab Results: Basic Metabolic Panel:  Recent Labs Lab 03/12/15 0027  03/13/15 1502 03/14/15 0327  NA  --   < > 134* 134*  K  --   < > 3.6 2.5*  CL  --   < > 97* 96*  CO2  --   < > 32 32  GLUCOSE  --   < > 139* 129*  BUN  --   < > 10 7  CREATININE  --   < > 1.02 0.79  CALCIUM  --   < > 7.8* 7.6*  MG 2.3  --   --  1.8  < > = values in this interval not displayed. Liver Function Tests:  Recent Labs Lab 03/12/15 0326  AST 14*  ALT 10*  ALKPHOS 54  BILITOT 1.0  PROT 6.5  ALBUMIN 3.0*   CBC:  Recent Labs Lab 03/11/15 1800  03/13/15 0505 03/14/15 0327  WBC 9.8  < > 11.4* 10.1  NEUTROABS 8.4*  --   --   --   HGB 15.2  < > 14.0  12.9*  HCT 45.8  < > 41.4 38.3*  MCV 93.1  < > 90.6 90.5  PLT 335  < > 305 304  < > = values in this interval not displayed. CBG:  Recent Labs Lab 03/12/15 0735 03/13/15 0756  GLUCAP 106* 129*   Medications: I have reviewed the patient's current medications. Scheduled Meds: . antiseptic oral rinse  7 mL Mouth Rinse BID  . enoxaparin (LOVENOX) injection  40 mg Subcutaneous Q24H  . magnesium sulfate 1 - 4 g bolus IVPB  1 g Intravenous Once  . potassium chloride  10 mEq Intravenous Q1 Hr x 6   Continuous Infusions: . dextrose 5 % and 0.45 % NaCl with KCl 10 mEq/L 125 mL/hr at 03/14/15 0757   PRN Meds:.morphine injection, ondansetron **OR** [DISCONTINUED] ondansetron (ZOFRAN) IV   Assessment/Plan: 74 y/o M w/ PMHx of HTN, HLD, [name] w/ atrophic bladder, admitted for obstructive colonic mass.   Colon Mass w/ Obstruction: S/p Colectomy + ostomy on 03/12/15. Dong much better today. Pain controlled, NGT removed.  -  Clear liquids today -Continued management per surgery -Morphine prn for pain -PT eval  Hypokalemia: K 2.5 this AM. Mag 1.8. -Supplement w/ IV K -Repeat BMP in PM  HTN: BP stable.  -BP meds on hold for now  Atrophic Bladder: Self-cath per patient. Follows w/ [facility]. Takes bactrim chronically at home, discontinued prior to surgery.  -RN to assist w/ self-cath -Restart Bactrim tomorrow  DVT/PE PPx: SCD's  Dispo: Disposition is deferred at this time, awaiting improvement of current medical problems.  Anticipated discharge in approximately 1-2 day(s).   The patient does have a current PCP Norman Herrlich, MD) and does need an [facility] hospital follow-up appointment after discharge.  The patient does not have transportation limitations that hinder transportation to clinic appointments.  .Services Needed at time of discharge: Y = Yes, Blank = No PT:   OT:   RN:   Equipment:   Other:     LOS: 2 days   Corky Sox, MD 03/14/2015, 12:04 PM

## 2015-03-14 NOTE — Consult Note (Signed)
WOC ostomy follow up Stoma type/location:  LLQ, end colostomy Stomal assessment/size: 2" round, budded from the skin, some mucosal sloughing along the proximal edges Peristomal assessment: intact  Treatment options for stomal/peristomal skin: added 2" barrier ring due to liquid stool and stoma low on slight curve in the belly  Output: liquid brown  Ostomy pouching: 2pc. 2 3/4" used today, with 2" barrier ring.  Education provided: demonstrated pouch change. Pt was very hesitant to assist with pouch change, would not practice with pouches prior to application.  He looks at the TV most of the time and sometimes only answers minimally.  I removed the old pouch and cleansed the skin.  Cut new wafer to size and explained measuring guide.  Applied skin barrier ring and new wafer/ attached pouch.  Pt did close and open the lock and roll closure several times but seemed to want to do this when WOC was not looking at the patient perform the task.  Ordered additional supplies for the patients room.   Will definitely need support with ostomy care while at home and may even benefit from short term rehab where he can learn more about the care of his ostomy  Enrolled patient in [facility] Start Discharge program: Yes WOC will follow along with you for continued support with ostomy teaching and care [place] [phone]

## 2015-03-14 NOTE — Evaluation (Signed)
Physical Therapy Evaluation Patient Details Name: Greg [name] MRN: [id number] DOB: 1940-10-28 Today's Date: 03/14/2015   History of Present Illness  74 y/o M w/ PMHx of HTN, HLD, [name] w/ atrophic bladder, admitted for obstructive colonic mass. now s/p resection and colostomy  Clinical Impression  Patient demonstrates deficits in functional mobility as indicated below. Will need continued skilled PT to address deficits and maximize function. Will see as indicated and progress as tolerated. Encouraged continued ambulation with staff.    Follow Up Recommendations Home health PT;Supervision - Intermittent    Equipment Recommendations  Rolling walker with 5" wheels    Recommendations for Other Services       Precautions / Restrictions Precautions Precautions: Fall Restrictions Weight Bearing Restrictions: No      Mobility  Bed Mobility               General bed mobility comments: received in chair  Transfers Overall transfer level: Needs assistance Equipment used: Rolling walker (2 wheeled) Transfers: Sit to/from Stand Sit to Stand: Min guard         General transfer comment: VCs for hand placement and positioning, patient with posterior weight shift in elevation requiring chair as stabilizing surface initially  Ambulation/Gait Ambulation/Gait assistance: Min guard Ambulation Distance (Feet): 210 Feet Assistive device: Rolling walker (2 wheeled) Gait Pattern/deviations: Step-through pattern;Decreased stride length;Drifts right/left;Narrow base of support Gait velocity: decreased Gait velocity interpretation: Below normal speed for age/gender General Gait Details: some instability noted, specifically with turns (patient with posterior tendency) Reliance on RW for stability, increased tightness in abdominal area during ambulation  Stairs            Wheelchair Mobility    Modified Rankin (Stroke Patients Only)       Balance Overall balance  assessment: Needs assistance Sitting-balance support: Feet supported Sitting balance-Leahy Scale: Good     Standing balance support: During functional activity;Bilateral upper extremity supported Standing balance-Leahy Scale: Fair                               Pertinent Vitals/Pain Pain Assessment: Faces Faces Pain Scale: Hurts a little bit Pain Location: abdomen Pain Descriptors / Indicators: Tightness    Home Living Family/patient expects to be discharged to:: Private residence Living Arrangements: Alone Available Help at Discharge: Family;Available PRN/intermittently Type of Home: Apartment Home Access: Stairs to enter Entrance Stairs-Rails: None Entrance Stairs-Number of Steps: 2 Home Layout: One level Home Equipment: None      Prior Function Level of Independence: Independent               Hand Dominance   Dominant Hand: Right    Extremity/Trunk Assessment               Lower Extremity Assessment: Generalized weakness         Communication   Communication: HOH  Cognition Arousal/Alertness: Awake/alert Behavior During Therapy: Flat affect Overall Cognitive Status: No family/caregiver present to determine baseline cognitive functioning                      General Comments      Exercises        Assessment/Plan    PT Assessment Patient needs continued PT services  PT Diagnosis Difficulty walking;Abnormality of gait;Acute pain   PT Problem List Decreased strength;Decreased activity tolerance;Decreased balance;Decreased mobility;Decreased coordination;Decreased safety awareness;Pain  PT Treatment Interventions DME instruction;Gait training;Stair training;Functional mobility training;Therapeutic activities;Therapeutic exercise;Balance training;Patient/family  education   PT Goals (Current goals can be found in the Care Plan section) Acute Rehab PT Goals Patient Stated Goal: to go home PT Goal Formulation: With  patient Time For Goal Achievement: 03/28/15 Potential to Achieve Goals: Good    Frequency Min 3X/week   Barriers to discharge        Co-evaluation               End of Session Equipment Utilized During Treatment: Gait belt Activity Tolerance: Patient tolerated treatment well;No increased pain Patient left: in chair;with call bell/phone within reach;with nursing/sitter in room Nurse Communication: Mobility status         Time: 1210-1227 PT Time Calculation (min) (ACUTE ONLY): 17 min   Charges:   PT Evaluation $Initial PT Evaluation Tier I: 1 Procedure     PT G CodesDuncan Dull 2015/04/12, 1:20 PM Alben Deeds, [place] DPT  [phone]

## 2015-03-14 NOTE — Progress Notes (Signed)
Patient ID: Greg [name], male   DOB: Nov 30, 1940, 74 y.o.   MRN: [id number] 2 Days Post-Op  Subjective: Pt with very little to say.  No nausea.  Lives at home by himself, but does not want to get out of bed.  Objective: Vital signs in last 24 hours: Temp:  [98.7 F (37.1 C)-99.1 F (37.3 C)] 98.7 F (37.1 C) (05/23 0618) Pulse Rate:  [64-89] 64 (05/23 0618) Resp:  [15-16] 15 (05/23 0618) BP: (130-138)/(81-84) 130/81 mmHg (05/23 0618) SpO2:  [96 %-97 %] 97 % (05/23 0618) Weight:  [89.1 kg (196 lb 6.9 oz)] 89.1 kg (196 lb 6.9 oz) (05/23 0618) Last BM Date: 03/14/15  Intake/Output from previous day: 05/22 0701 - 05/23 0700 In: 2847 [P.O.:60; I.V.:2787] Out: 3450 [Urine:1250; Emesis/NG output:150; Stool:2050] Intake/Output this shift:    PE: Abd: soft, mild bloating, but lots of output in colostomy bag, appropriately tender, midline incision is c/d/i with staples  Lab Results:   Recent Labs  03/13/15 0505 03/14/15 0327  WBC 11.4* 10.1  HGB 14.0 12.9*  HCT 41.4 38.3*  PLT 305 304   BMET  Recent Labs  03/13/15 1502 03/14/15 0327  NA 134* 134*  K 3.6 2.5*  CL 97* 96*  CO2 32 32  GLUCOSE 139* 129*  BUN 10 7  CREATININE 1.02 0.79  CALCIUM 7.8* 7.6*   PT/INR No results for input(s): LABPROT, INR in the last 72 hours. CMP     Component Value Date/Time   NA 134* 03/14/2015 0327   K 2.5* 03/14/2015 0327   CL 96* 03/14/2015 0327   CO2 32 03/14/2015 0327   GLUCOSE 129* 03/14/2015 0327   BUN 7 03/14/2015 0327   CREATININE 0.79 03/14/2015 0327   CREATININE 0.97 03/11/2014 1423   CALCIUM 7.6* 03/14/2015 0327   PROT 6.5 03/12/2015 0326   ALBUMIN 3.0* 03/12/2015 0326   AST 14* 03/12/2015 0326   ALT 10* 03/12/2015 0326   ALKPHOS 54 03/12/2015 0326   BILITOT 1.0 03/12/2015 0326   GFRNONAA >60 03/14/2015 0327   GFRNONAA 79 09/03/2013 1117   GFRAA >60 03/14/2015 0327   GFRAA >89 09/03/2013 1117   Lipase     Component Value Date/Time   LIPASE 26 02/27/2015  2002       Studies/Results: No results found.  Anti-infectives: Anti-infectives    Start     Dose/Rate Route Frequency Ordered Stop   03/13/15 0100  cefoTEtan (CEFOTAN) 2 g in dextrose 5 % 50 mL IVPB     2 g 100 mL/hr over 30 Minutes Intravenous Every 12 hours 03/12/15 1632 03/13/15 0122   03/12/15 1230  cefoTEtan (CEFOTAN) 2 g in dextrose 5 % 50 mL IVPB     2 g 100 mL/hr over 30 Minutes Intravenous On call to O.R. 03/12/15 1221 03/12/15 1450   03/12/15 0330  [MAR Hold]  sulfamethoxazole-trimethoprim (BACTRIM) 160 mg in dextrose 5 % 250 mL IVPB  Status:  Discontinued     (MAR Hold since 03/12/15 1259)   160 mg 260 mL/hr over 60 Minutes Intravenous Every 12 hours 03/12/15 0227 03/12/15 1632       Assessment/Plan   Pod 2 sigmoid colectomy/colostomy Dr. Hulen Skains  1. PT has been ordered.  Patient needs to get OOB and mobilize.  Concern if he doesn't and his desire to return home by himself.  Will see how he progresses with PT. 2. pulm toilet, oob 3. Clear liquids 4. HH arranged for routine ostomy care 5. lovenox scds 6 await  path  LOS: 2 days    Toma Erichsen E 03/14/2015, 9:49 AM Pager: [phone]

## 2015-03-14 NOTE — Progress Notes (Signed)
  Date: 03/14/2015  Patient name: Greg [name]  Medical record number: [id number]  Date of birth: [birth year]   This patient's plan of care was discussed with the house staff. Please see Dr. Ronnald Ramp' note for complete details. I concur with his findings.  PT today.  Clears today.  He continues to have hypokalemia, possibly due to high output from ostomy.  Continue to monitor and replace today.  Pathology report pending from surgery.    Sid Falcon, MD 03/14/2015, 2:18 PM

## 2015-03-14 NOTE — Progress Notes (Signed)
CRITICAL VALUE ALERT  Critical value received:  K 2.5  Date of notification: 03/14/15  Time of notification:  0512  Critical value read back:YES  Nurse who received alert:  [facility]  MD notified (1st page):  [phone]  Time of first page:  0515  MD notified (2nd page):  Time of second page:  Responding MD:  Ethelene Hal  Time MD responded: [phone]

## 2015-03-14 NOTE — Progress Notes (Signed)
Inpatient Progress Note - Internal Medicine  Subjective: Today the patient reports his pain is well controlled overall but was "due for another dose" when we spoke this morning. He reports getting out of bed and walking. He denies abdominal distension or further difficulty with his colostomy bag. He denies nausea/vomiting and is hungry this morning.  Objective: Vital signs in last 24 hours: Filed Vitals:   03/13/15 0803 03/13/15 1255 03/13/15 2249 03/14/15 0618  BP:  138/82 136/84 130/81  Pulse:  89 69 64  Temp:  98.8 F (37.1 C) 99.1 F (37.3 C) 98.7 F (37.1 C)  TempSrc:  Oral Oral Oral  Resp: 14 16 16 15   Height:      Weight:    89.1 kg (196 lb 6.9 oz)  SpO2: 98% 96% 96% 97%   Weight change: -1.8 kg (-3 lb 15.5 oz)  Intake/Output Summary (Last 24 hours) at 03/14/15 0844 Last data filed at 03/14/15 4562  Gross per 24 hour  Intake   2847 ml  Output   3350 ml  Net   -503 ml   General: Calm, tired, conversant man lying in bed HEENT:  EOMI, sclerae/conjunctiva clear, mmm no oropharyngeal erythema Cardiac: RRR, no m/r/g Pulmonary:  CTAB, no increased WOB Abdominal: Wound dressing clean, not removed. Colostomy bag in place with good seal, soft brown stool present in bag. Soft, with mild appropriate TTP over abdomen. +BS. Extremities: No pedal edema. Neurologic:  Affect appropriate, EOMI, moving all 4 limbs spontaneously.  Lab Results: Basic Metabolic Panel:  Recent Labs Lab 03/12/15 0027  03/13/15 1502 03/14/15 0327  NA  --   < > 134* 134*  K  --   < > 3.6 2.5*  CL  --   < > 97* 96*  CO2  --   < > 32 32  GLUCOSE  --   < > 139* 129*  BUN  --   < > 10 7  CREATININE  --   < > 1.02 0.79  CALCIUM  --   < > 7.8* 7.6*  MG 2.3  --   --  1.8  < > = values in this interval not displayed.  CBC:  Recent Labs Lab 03/11/15 1800  03/13/15 0505 03/14/15 0327  WBC 9.8  < > 11.4* 10.1  NEUTROABS 8.4*  --   --   --   HGB 15.2  < > 14.0 12.9*  HCT 45.8  < > 41.4 38.3*  MCV  93.1  < > 90.6 90.5  PLT 335  < > 305 304  < > = values in this interval not displayed.  Misc. Labs: CEA: 2.5 ng/mL (03/12/2015)  Micro Results: Recent Results (from the past 240 hour(s))  Surgical pcr screen     Status: None   Collection Time: 03/12/15 12:24 PM  Result Value Ref Range Status   MRSA, PCR NEGATIVE NEGATIVE Final   Staphylococcus aureus NEGATIVE NEGATIVE Final    Comment:        The Xpert SA Assay (FDA approved for NASAL specimens in patients over 76 years of age), is one component of a comprehensive surveillance program.  Test performance has been validated by [facility] for patients greater than or equal to 53 year old. It is not intended to diagnose infection nor to guide or monitor treatment.    Medications: I have reviewed the patient's current medications. Scheduled Meds: . antiseptic oral rinse  7 mL Mouth Rinse BID  . enoxaparin (LOVENOX) injection  40  mg Subcutaneous Q24H  . magnesium sulfate 1 - 4 g bolus IVPB  1 g Intravenous Once  . potassium chloride  10 mEq Intravenous Q1 Hr x 4  . potassium chloride  10 mEq Intravenous Q1 Hr x 6   Continuous Infusions: . dextrose 5 % and 0.45 % NaCl with KCl 10 mEq/L 125 mL/hr at 03/14/15 0757   PRN Meds:.acetaminophen, morphine injection, ondansetron **OR** [DISCONTINUED] ondansetron (ZOFRAN) IV Assessment/Plan: Principal Problem:   Colon obstruction Active Problems:   Essential hypertension   HEMOCCULT POSITIVE STOOL   Colonic mass   Hypokalemia   Lower abdominal pain Greg [name] is a 74 y.o. M p/w abdominal pain and obstipation with known annular constricting sigmoid mass c/f malignancy now s/p .  Large Bowel Obstruction: p/w abdominal pain worse with eating, decreased PO, constipation, obstipation. Abd XR and CT showed multiple air-fluid levels, massive colonic dilation proximal to annular constricting sigmoid mass concerning for malignancy. Now POD2 s/p sigmoid colectomy with colostomy. Path  pending. - Surgery following, appreciate recs. - Start clears today, advance diet as tolerated - PT c/s, OOB - Morphine 2 mg q2hr PRN - Zofran 4 mg q6hr PRN  Atrophic Bladder: Chronic, follows with Dr. Jeffie Pollock of [facility], self-caths at home. On chronic bactrim for UTI PPX. - Continue I&O cath - pt may continue to self-cath - Hold home Bactrim  Hypokalemia: K 3.0 on presentation. 2.5 this AM. - s/p 6 runs 10 mEq KCl IV, 1 g Mg. - PM BMP pending  HTN: Pt normotensive since admission. Home meds = HCTZ 25 mg daily. - Hold home HCTZ  F: D5 1/2NS @ 125 mL/hr E: Hypokalemia as above. CTM N: NPO for colonoscopy, likely abdominal surgery  PPX: SCDs   Dispo: Disposition is deferred at this time, awaiting improvement of current medical problems.  Anticipated discharge in approximately 1-2 day(s).   The patient does have a current PCP Norman Herrlich, MD) and does need an [facility] hospital follow-up appointment after discharge.  The patient does not have transportation limitations that hinder transportation to clinic appointments.  .Services Needed at time of discharge: Y = Yes, Blank = No PT:   OT:   RN:   Equipment:   Other:     LOS: 2 days   Susa Day, Med Student 03/14/2015, 8:44 AM

## 2015-03-14 NOTE — Care Management Note (Signed)
Case Management Note  Patient Details  Name: Greg [name] MRN: [id number] Date of Birth: 1941-01-15  Subjective/Objective:                    Action/Plan: Received home health order for RN . Discussed with patient . Confirmed face sheet information . Provided patient with a list of home health agencies , he will discuss with his sister and than make a decision. Will continue to follow .   Expected Discharge Date:                  Expected Discharge Plan:  [place]  In-House Referral:     Discharge planning Services  CM Consult  Post Acute Care Choice:  Home Health Choice offered to:  Patient  DME Arranged:    DME Agency:     HH Arranged:    [place] Agency:     Status of Service:  In process, will continue to follow  Medicare Important Message Given:    Date Medicare IM Given:    Medicare IM give by:    Date Additional Medicare IM Given:    Additional Medicare Important Message give by:     If discussed at [place] of Stay Meetings, dates discussed:    Additional Comments:  Marilu Favre, RN 03/14/2015, 11:05 AM

## 2015-03-15 ENCOUNTER — Encounter (HOSPITAL_COMMUNITY): Payer: Self-pay | Admitting: General Surgery

## 2015-03-15 LAB — BASIC METABOLIC PANEL
ANION GAP: 6 (ref 5–15)
ANION GAP: 7 (ref 5–15)
BUN: <5 mg/dL — ABNORMAL LOW (ref 6–20)
CALCIUM: 7.6 mg/dL — AB (ref 8.9–10.3)
CHLORIDE: 95 mmol/L — AB (ref 101–111)
CO2: 29 mmol/L (ref 22–32)
CO2: 32 mmol/L (ref 22–32)
CREATININE: 0.77 mg/dL (ref 0.61–1.24)
Calcium: 7.5 mg/dL — ABNORMAL LOW (ref 8.9–10.3)
Chloride: 97 mmol/L — ABNORMAL LOW (ref 101–111)
Creatinine, Ser: 0.75 mg/dL (ref 0.61–1.24)
GFR calc non Af Amer: >60 mL/min (ref >60)
GLUCOSE: 124 mg/dL — AB (ref 65–99)
Glucose, Bld: 123 mg/dL — ABNORMAL HIGH (ref 65–99)
POTASSIUM: 3.9 mmol/L (ref 3.5–5.1)
Potassium: 2.9 mmol/L — ABNORMAL LOW (ref 3.5–5.1)
SODIUM: 132 mmol/L — AB (ref 135–145)
SODIUM: 134 mmol/L — AB (ref 135–145)

## 2015-03-15 LAB — GLUCOSE, CAPILLARY: Glucose-Capillary: 120 mg/dL — ABNORMAL HIGH (ref 65–99)

## 2015-03-15 LAB — MAGNESIUM: Magnesium: 1.9 mg/dL (ref 1.7–2.4)

## 2015-03-15 MED ORDER — SULFAMETHOXAZOLE-TRIMETHOPRIM 800-160 MG PO TABS
1.0000 | ORAL_TABLET | Freq: Two times a day (BID) | ORAL | Status: DC
  Administered 2015-03-15 – 2015-03-19 (×9): 1 via ORAL
  Filled 2015-03-15 (×11): qty 1

## 2015-03-15 MED ORDER — POTASSIUM CHLORIDE CRYS ER 20 MEQ PO TBCR
40.0000 meq | EXTENDED_RELEASE_TABLET | Freq: Once | ORAL | Status: AC
  Administered 2015-03-15: 40 meq via ORAL
  Filled 2015-03-15: qty 2

## 2015-03-15 NOTE — Progress Notes (Signed)
Patient ID: Greg [name], male   DOB: October 05, 1941, 74 y.o.   MRN: [id number] 3 Days Post-Op  Subjective: Pt much more interactive today.  Up in chair. Tolerating clear liquids, no nausea, but feels a little bloated.  Objective: Vital signs in last 24 hours: Temp:  [97.8 F (36.6 C)-98.6 F (37 C)] 98.3 F (36.8 C) (05/24 7408) Pulse Rate:  [73-79] 74 (05/24 0608) Resp:  [16-17] 17 (05/24 0608) BP: (121-142)/(79-99) 138/79 mmHg (05/24 0608) SpO2:  [96 %-97 %] 97 % (05/24 0608) Weight:  [90.3 kg (199 lb 1.2 oz)] 90.3 kg (199 lb 1.2 oz) (05/24 1448) Last BM Date: 03/14/15  Intake/Output from previous day: 05/23 0701 - 05/24 0700 In: 3116.8 [P.O.:360; I.V.:2756.8] Out: 2400 [Urine:1300; Stool:1100] Intake/Output this shift:    PE: Abd: soft, still somewhat bloated, +BS, ostomy is working and bag just changed, midline incision is c/d/i with staples  Lab Results:   Recent Labs  03/13/15 0505 03/14/15 0327  WBC 11.4* 10.1  HGB 14.0 12.9*  HCT 41.4 38.3*  PLT 305 304   BMET  Recent Labs  03/14/15 1620 03/15/15 0435  NA 134* 132*  K 3.5 2.9*  CL 93* 97*  CO2 36* 29  GLUCOSE 119* 123*  BUN 6 <5*  CREATININE 0.79 0.75  CALCIUM 8.0* 7.5*   PT/INR No results for input(s): LABPROT, INR in the last 72 hours. CMP     Component Value Date/Time   NA 132* 03/15/2015 0435   K 2.9* 03/15/2015 0435   CL 97* 03/15/2015 0435   CO2 29 03/15/2015 0435   GLUCOSE 123* 03/15/2015 0435   BUN <5* 03/15/2015 0435   CREATININE 0.75 03/15/2015 0435   CREATININE 0.97 03/11/2014 1423   CALCIUM 7.5* 03/15/2015 0435   PROT 6.5 03/12/2015 0326   ALBUMIN 3.0* 03/12/2015 0326   AST 14* 03/12/2015 0326   ALT 10* 03/12/2015 0326   ALKPHOS 54 03/12/2015 0326   BILITOT 1.0 03/12/2015 0326   GFRNONAA >60 03/15/2015 0435   GFRNONAA 79 09/03/2013 1117   GFRAA >60 03/15/2015 0435   GFRAA >89 09/03/2013 1117   Lipase     Component Value Date/Time   LIPASE 26 02/27/2015 2002        Studies/Results: No results found.  Anti-infectives: Anti-infectives    Start     Dose/Rate Route Frequency Ordered Stop   03/13/15 0100  cefoTEtan (CEFOTAN) 2 g in dextrose 5 % 50 mL IVPB     2 g 100 mL/hr over 30 Minutes Intravenous Every 12 hours 03/12/15 1632 03/13/15 0122   03/12/15 1230  cefoTEtan (CEFOTAN) 2 g in dextrose 5 % 50 mL IVPB     2 g 100 mL/hr over 30 Minutes Intravenous On call to O.R. 03/12/15 1221 03/12/15 1450   03/12/15 0330  [MAR Hold]  sulfamethoxazole-trimethoprim (BACTRIM) 160 mg in dextrose 5 % 250 mL IVPB  Status:  Discontinued     (MAR Hold since 03/12/15 1259)   160 mg 260 mL/hr over 60 Minutes Intravenous Every 12 hours 03/12/15 0227 03/12/15 1632       Assessment/Plan   Pod 3 sigmoid colectomy/colostomy Dr. Hulen Skains  1. Cont mobilization 2. pulm toilet, oob 3. Clear liquids, may leave on this today due to some abdominal bloating, will d/w Dr. Hulen Skains to see if he wants to try full liquids later today 4. HH arranged for routine ostomy care 5. lovenox scds 6 await path  LOS: 3 days    Tyra Michelle E 03/15/2015, 8:49 AM  Pager: [phone]

## 2015-03-15 NOTE — Progress Notes (Signed)
  Date: 03/15/2015  Patient name: Greg [name]  Medical record number: [id number]  Date of birth: 09-Jan-1941   This patient's plan of care was discussed with the house staff. Please see Dr. Ronnald Ramp' note for complete details. I concur with his findings.  Greg [name] is doing well.  Pain controlled.  He is learning to manage his ostomy.  Pathology is pending.  K continues to be low, replaced orally today.  Repeat BMET this evening with CMET in the AM due to mildly low Ca.   Continue to monitor inpatient.  Surgery is also following.     Sid Falcon, MD 03/15/2015, 2:54 PM

## 2015-03-15 NOTE — Progress Notes (Signed)
Subjective: No significant complaints today. Tolerating clears. Says his pain is well controlled.   Objective: Vital signs in last 24 hours: Filed Vitals:   03/14/15 0618 03/14/15 1330 03/14/15 2122 03/15/15 0608  BP: 130/81 121/84 142/99 138/79  Pulse: 64 73 79 74  Temp: 98.7 F (37.1 C) 97.8 F (36.6 C) 98.6 F (37 C) 98.3 F (36.8 C)  TempSrc: Oral Oral Oral Oral  Resp: 15 16 17 17   Height:      Weight: 196 lb 6.9 oz (89.1 kg)   199 lb 1.2 oz (90.3 kg)  SpO2: 97% 97% 96% 97%   Weight change: 2 lb 10.3 oz (1.2 kg)  Intake/Output Summary (Last 24 hours) at 03/15/15 0749 Last data filed at 03/15/15 1027  Gross per 24 hour  Intake 3116.75 ml  Output   2400 ml  Net 716.75 ml   Physical Exam: General: AA male, alert, cooperative, NAD.  HEENT: PERRL, EOMI. 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  Extremities: No cyanosis, clubbing, or edema Neurologic: Alert & oriented x3, cranial nerves II-XII intact, strength grossly intact, sensation intact to light touch   Lab Results: Basic Metabolic Panel:  Recent Labs Lab 03/14/15 0327 03/14/15 1620 03/15/15 0435  NA 134* 134* 132*  K 2.5* 3.5 2.9*  CL 96* 93* 97*  CO2 32 36* 29  GLUCOSE 129* 119* 123*  BUN 7 6 <5*  CREATININE 0.79 0.79 0.75  CALCIUM 7.6* 8.0* 7.5*  MG 1.8  --  1.9   Liver Function Tests:  Recent Labs Lab 03/12/15 0326  AST 14*  ALT 10*  ALKPHOS 54  BILITOT 1.0  PROT 6.5  ALBUMIN 3.0*   CBC:  Recent Labs Lab 03/11/15 1800  03/13/15 0505 03/14/15 0327  WBC 9.8  < > 11.4* 10.1  NEUTROABS 8.4*  --   --   --   HGB 15.2  < > 14.0 12.9*  HCT 45.8  < > 41.4 38.3*  MCV 93.1  < >  90.6 90.5  PLT 335  < > 305 304  < > = values in this interval not displayed. CBG:  Recent Labs Lab 03/12/15 0735 03/13/15 0756 03/14/15 1521  GLUCAP 106* 129* 102*   Medications: I have reviewed the patient's current medications. Scheduled Meds: . antiseptic oral rinse  7 mL Mouth Rinse BID  . enoxaparin (LOVENOX) injection  40 mg Subcutaneous Q24H   Continuous Infusions: . dextrose 5 % and 0.45 % NaCl with KCl 10 mEq/L 125 mL/hr at 03/15/15 0619   PRN Meds:.morphine injection, ondansetron **OR** [DISCONTINUED] ondansetron (ZOFRAN) IV   Assessment/Plan: 74 y/o M w/ PMHx of HTN, HLD, [name] w/ atrophic bladder, admitted for obstructive colonic mass.   Colon Mass w/ Obstruction: S/p Colectomy + ostomy on 03/12/15. Continue to improve. Pain controlled.  -Continue clear liquids today, may advance to full liquids later today -Continued management per surgery -Morphine prn for pain -HHPT  Hypokalemia: K 2.9 this AM. Mag 1.9. -Supplement w/ po K -Repeat BMP in AM  HTN: BP stable.  -BP meds on hold for now  Atrophic Bladder: Self-cath per patient. Follows w/ [facility]. Takes bactrim chronically at home, discontinued prior to surgery.  -RN to assist w/ self-cath -Restart Bactrim today.  DVT/PE PPx: SCD's  Dispo: Disposition is deferred at this time, awaiting improvement of current medical problems.  Anticipated discharge in approximately 1-2 day(s).   The patient does have a current PCP Norman Herrlich, MD) and does need an [facility] hospital follow-up appointment after discharge.  The patient does not have transportation limitations that hinder transportation to clinic appointments.  .Services Needed at time of discharge: Y = Yes, Blank = No PT: HH  OT:   RN: HH (ostomy)  Equipment:   Other:     LOS: 3 days   Corky Sox, MD 03/15/2015, 7:49 AM

## 2015-03-15 NOTE — Care Management Note (Addendum)
Case Management Note  Patient Details  Name: Jovanne Riggenbach MRN: [id number] Date of Birth: 06/22/1941  Subjective/Objective:                    Action/Plan:  UR updated , HHPT order received set up with [facility] Asked Dr Ronnald Ramp for order for HHPT   Expected Discharge Date:       03-17-15            Expected Discharge Plan:  [place]  In-House Referral:     Discharge planning Services  CM Consult  Post Acute Care Choice:  Home Health, Durable Medical Equipment Choice offered to:  Patient  DME Arranged:  Walker rolling DME Agency:  [place] Arranged:  RN, PT [facility] Agency:  [place]  Status of Service:  In process, will continue to follow  Medicare Important Message Given:    Date Medicare IM Given:  03/15/15 Medicare IM give by:  Magdalen Spatz RN BSN  Date Additional Medicare IM Given:    Additional Medicare Important Message give by:     If discussed at [place] of Stay Meetings, dates discussed:    Additional Comments:  Marilu Favre, RN 03/15/2015, 11:13 AM

## 2015-03-15 NOTE — Progress Notes (Signed)
Inpatient Progress Note - Internal Medicine  Subjective: This morning the patient is sitting up in his chair and eating breakfast. He reports adequate pain control. He reports that he has walked around his room and that walking with PT went well. He is learning how to change his ostomy bag. He is voiding via self-cath as he was prior to admission. He has no acute complaints.  Objective: Vital signs in last 24 hours: Filed Vitals:   03/14/15 0618 03/14/15 1330 03/14/15 2122 03/15/15 0608  BP: 130/81 121/84 142/99 138/79  Pulse: 64 73 79 74  Temp: 98.7 F (37.1 C) 97.8 F (36.6 C) 98.6 F (37 C) 98.3 F (36.8 C)  TempSrc: Oral Oral Oral Oral  Resp: 15 16 17 17   Height:      Weight: 89.1 kg (196 lb 6.9 oz)   90.3 kg (199 lb 1.2 oz)  SpO2: 97% 97% 96% 97%   Weight change: 1.2 kg (2 lb 10.3 oz)  Intake/Output Summary (Last 24 hours) at 03/15/15 0825 Last data filed at 03/15/15 8527  Gross per 24 hour  Intake 3116.75 ml  Output   2400 ml  Net 716.75 ml   General: Calm, conversant, sitting up in chair and eating breakfast HEENT:  EOMI, sclerae/conjunctiva clear, mmm no oropharyngeal erythema Cardiac: RRR, no m/r/g Pulmonary:  Mild bibasilar crackles, adequate air movement, no increased WOB Abdominal: Soft,mildly tender to palpation L > R, dressings not removed, colostomy bag present with small amount of thin brown stool, normoactive bowel sounds Extremities: No cyanosis or clubbing. No pedal edema. 2+ bilateral dorsal pedal pulses Neurologic:  Conversant, grossly oriented to conversation, speech content appropriate. CNs II-XII grossly intact, moving all 4 extremities spontaneously.  Lab Results: Basic Metabolic Panel:  Recent Labs Lab 03/14/15 0327 03/14/15 1620 03/15/15 0435  NA 134* 134* 132*  K 2.5* 3.5 2.9*  CL 96* 93* 97*  CO2 32 36* 29  GLUCOSE 129* 119* 123*  BUN 7 6 <5*  CREATININE 0.79 0.79 0.75  CALCIUM 7.6* 8.0* 7.5*  MG 1.8  --  1.9   CBG:  Recent  Labs Lab 03/12/15 0735 03/13/15 0756 03/14/15 1521 03/15/15 0732  GLUCAP 106* 129* 102* 120*   Medications: I have reviewed the patient's current medications. Scheduled Meds: . antiseptic oral rinse  7 mL Mouth Rinse BID  . enoxaparin (LOVENOX) injection  40 mg Subcutaneous Q24H   Continuous Infusions: . dextrose 5 % and 0.45 % NaCl with KCl 10 mEq/L 125 mL/hr at 03/15/15 0619   PRN Meds:.morphine injection, ondansetron **OR** [DISCONTINUED] ondansetron (ZOFRAN) IV Assessment/Plan: Principal Problem:   Colon obstruction Active Problems:   Essential hypertension   HEMOCCULT POSITIVE STOOL   Colonic mass   Hypokalemia   Lower abdominal pain Greg [name] is a 74 y.o. M p/w abdominal pain and obstipation with known annular constricting sigmoid mass c/f malignancy now s/p .  Sigmoid Mass with Large Bowel Obstruction: p/w abdominal pain worse with eating, decreased PO, constipation, obstipation. Abd XR and CT showed multiple air-fluid levels, massive colonic dilation proximal to annular constricting sigmoid mass concerning for malignancy. Now POD3 s/p sigmoid colectomy with colostomy. Path pending. - Surgery following, appreciate recs. - Advance diet as tolerated: per surgery, may continue clear liquids today 2/2 some abd bloating - PT seen yesterday, recommend HH PT and walker. - Morphine 2q2 PRN - Zofran 4 mg q6hr PRN - OOB, IS  Atrophic Bladder: Chronic, follows with Dr. Jeffie Pollock of [facility], self-caths at home. On chronic  bactrim for UTI PPX. - Continue I&O cath - pt may continue to self-cath - Restart home Bactrim  Hypokalemia: K 3.5 5/23 PM. 2.9 this AM. - s/p 40 mEq KCl PO today  HTN: Pt normotensive since admission. Home meds = HCTZ 25 mg daily. - Hold home HCTZ  F: D5 1/2NS @ 125 mL/hr E: Hypokalemia as above. CTM N: Clear liquids  PPX: SCDs  Dispo: Disposition is deferred at this time, awaiting improvement of current medical problems.  Anticipated  discharge in approximately 1-2 day(s).   The patient does have a current PCP Norman Herrlich, MD) and does need an [facility] hospital follow-up appointment after discharge.  The patient does not have transportation limitations that hinder transportation to clinic appointments.  .Services Needed at time of discharge: Y = Yes, Blank = No PT:   OT:   RN:   Equipment:   Other:     LOS: 3 days   Susa Day, Med Student 03/15/2015, 8:25 AM

## 2015-03-16 LAB — GLUCOSE, CAPILLARY
Glucose-Capillary: 109 mg/dL — ABNORMAL HIGH (ref 65–99)
Glucose-Capillary: 117 mg/dL — ABNORMAL HIGH (ref 65–99)

## 2015-03-16 LAB — COMPREHENSIVE METABOLIC PANEL
ALT: 13 U/L — ABNORMAL LOW (ref 17–63)
AST: 16 U/L (ref 15–41)
Albumin: 2.2 g/dL — ABNORMAL LOW (ref 3.5–5.0)
Alkaline Phosphatase: 54 U/L (ref 38–126)
Anion gap: 7 (ref 5–15)
BUN: <5 mg/dL — ABNORMAL LOW (ref 6–20)
CALCIUM: 7.8 mg/dL — AB (ref 8.9–10.3)
CHLORIDE: 97 mmol/L — AB (ref 101–111)
CO2: 29 mmol/L (ref 22–32)
Creatinine, Ser: 0.8 mg/dL (ref 0.61–1.24)
GFR calc Af Amer: >60 mL/min (ref >60)
GFR calc non Af Amer: >60 mL/min (ref >60)
Glucose, Bld: 120 mg/dL — ABNORMAL HIGH (ref 65–99)
POTASSIUM: 3.2 mmol/L — AB (ref 3.5–5.1)
Sodium: 133 mmol/L — ABNORMAL LOW (ref 135–145)
TOTAL PROTEIN: 5.5 g/dL — AB (ref 6.5–8.1)
Total Bilirubin: 0.5 mg/dL (ref 0.3–1.2)

## 2015-03-16 LAB — CBC
HCT: 42.3 % (ref 39.0–52.0)
HEMOGLOBIN: 13.9 g/dL (ref 13.0–17.0)
MCH: 30.1 pg (ref 26.0–34.0)
MCHC: 32.9 g/dL (ref 30.0–36.0)
MCV: 91.6 fL (ref 78.0–100.0)
Platelets: 372 10*3/uL (ref 150–400)
RBC: 4.62 MIL/uL (ref 4.22–5.81)
RDW: 13.5 % (ref 11.5–15.5)
WBC: 9.7 10*3/uL (ref 4.0–10.5)

## 2015-03-16 LAB — MAGNESIUM: MAGNESIUM: 1.8 mg/dL (ref 1.7–2.4)

## 2015-03-16 MED ORDER — TRAMADOL HCL 50 MG PO TABS
50.0000 mg | ORAL_TABLET | Freq: Four times a day (QID) | ORAL | Status: DC | PRN
  Administered 2015-03-17 – 2015-03-18 (×2): 50 mg via ORAL
  Filled 2015-03-16 (×2): qty 1

## 2015-03-16 MED ORDER — POTASSIUM CHLORIDE CRYS ER 20 MEQ PO TBCR
40.0000 meq | EXTENDED_RELEASE_TABLET | ORAL | Status: AC
  Administered 2015-03-16 (×2): 40 meq via ORAL
  Filled 2015-03-16 (×2): qty 2

## 2015-03-16 MED ORDER — ACETAMINOPHEN 325 MG PO TABS: 325.0000 mg | ORAL_TABLET | Freq: Four times a day (QID) | ORAL | Status: DC | PRN

## 2015-03-16 MED ORDER — MAGNESIUM SULFATE IN D5W 10-5 MG/ML-% IV SOLN
1.0000 g | Freq: Once | INTRAVENOUS | Status: AC
  Administered 2015-03-16: 1 g via INTRAVENOUS
  Filled 2015-03-16: qty 100

## 2015-03-16 NOTE — Plan of Care (Signed)
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

## 2015-03-16 NOTE — Progress Notes (Signed)
  Date: 03/16/2015  Patient name: Greg [name]  Medical record number: [id number]  Date of birth: 1941/02/07   This patient's plan of care was discussed with the house staff. Please see Dr. Claris Gladden note for complete details. I concur with her findings.  Greg [name] is doing well, distention improved.  He is tolerating his diet which will be advanced today.     Sid Falcon, MD 03/16/2015, 4:02 PM

## 2015-03-16 NOTE — Progress Notes (Signed)
Patient ID: Greg [name], male   DOB: [birth year], 74 y.o.   MRN: [id number]     [facility]      [address] [place], [place] [address]    Phone: [phone] FAX: [phone]     Subjective: Tolerating clears.  No n/v.  I&Os caths.   Objective:  Vital signs:  Filed Vitals:   03/15/15 0608 03/15/15 1330 03/15/15 2159 03/16/15 0630  BP: 138/79 125/80 148/84 129/84  Pulse: 74 72 72 66  Temp: 98.3 F (36.8 C) 97.8 F (36.6 C) 98 F (36.7 C) 98.4 F (36.9 C)  TempSrc: Oral Axillary Oral Oral  Resp: 17 18 16 18   Height:      Weight: 90.3 kg (199 lb 1.2 oz)   92.1 kg (203 lb 0.7 oz)  SpO2: 97% 100% 100% 100%    Last BM Date: 03/15/15  Intake/Output   Yesterday:  05/24 0701 - 05/25 0700 In: 1900.8 [P.O.:480; I.V.:1420.8] Out: 1975 [Urine:1200; Stool:775] This shift: I/O last 3 completed shifts: In: 3407.6 [P.O.:480; I.V.:2927.6] Out: 3825 [Urine:2100; Stool:1725]    Physical Exam: General: Pt awake/alert/oriented x4 in no acute distress  Abdomen: Soft.  Nondistended.  Ttp.  Midline wound is c/d/i.  llq colostomy is functioning, pink.  No evidence of peritonitis.  No incarcerated hernias.    Problem List:   Principal Problem:   Colon obstruction Active Problems:   Essential hypertension   HEMOCCULT POSITIVE STOOL   Colonic mass   Hypokalemia   Lower abdominal pain    Results:   Labs: Results for orders placed or performed during the hospital encounter of 03/11/15 (from the past 48 hour(s))  Glucose, capillary     Status: Abnormal   Collection Time: 03/14/15  3:21 PM  Result Value Ref Range   Glucose-Capillary 102 (H) 65 - 99 mg/dL  Basic metabolic panel     Status: Abnormal   Collection Time: 03/14/15  4:20 PM  Result Value Ref Range   Sodium 134 (L) 135 - 145 mmol/L   Potassium 3.5 3.5 - 5.1 mmol/L   Chloride 93 (L) 101 - 111 mmol/L   CO2 36 (H) 22 - 32 mmol/L   Glucose, Bld 119 (H) 65 - 99  mg/dL   BUN 6 6 - 20 mg/dL   Creatinine, Ser 0.79 0.61 - 1.24 mg/dL   Calcium 8.0 (L) 8.9 - 10.3 mg/dL   GFR calc non Af Amer >60 >60 mL/min   GFR calc Af Amer >60 >60 mL/min    Comment: (NOTE) The eGFR has been calculated using the CKD EPI equation. This calculation has not been validated in all clinical situations. eGFR's persistently <60 mL/min signify possible Chronic Kidney Disease.    Anion gap 5 5 - 15  Basic metabolic panel Once     Status: Abnormal   Collection Time: 03/15/15  4:35 AM  Result Value Ref Range   Sodium 132 (L) 135 - 145 mmol/L   Potassium 2.9 (L) 3.5 - 5.1 mmol/L    Comment: DELTA CHECK NOTED   Chloride 97 (L) 101 - 111 mmol/L   CO2 29 22 - 32 mmol/L   Glucose, Bld 123 (H) 65 - 99 mg/dL   BUN <5 (L) 6 - 20 mg/dL   Creatinine, Ser 0.75 0.61 - 1.24 mg/dL   Calcium 7.5 (L) 8.9 - 10.3 mg/dL   GFR calc non Af Amer >60 >60 mL/min   GFR calc Af Amer >60 >60 mL/min  Comment: (NOTE) The eGFR has been calculated using the CKD EPI equation. This calculation has not been validated in all clinical situations. eGFR's persistently <60 mL/min signify possible Chronic Kidney Disease.    Anion gap 6 5 - 15  Magnesium     Status: None   Collection Time: 03/15/15  4:35 AM  Result Value Ref Range   Magnesium 1.9 1.7 - 2.4 mg/dL  Glucose, capillary     Status: Abnormal   Collection Time: 03/15/15  7:32 AM  Result Value Ref Range   Glucose-Capillary 120 (H) 65 - 99 mg/dL  Basic metabolic panel     Status: Abnormal   Collection Time: 03/15/15  7:52 PM  Result Value Ref Range   Sodium 134 (L) 135 - 145 mmol/L   Potassium 3.9 3.5 - 5.1 mmol/L    Comment: SLIGHT HEMOLYSIS   Chloride 95 (L) 101 - 111 mmol/L   CO2 32 22 - 32 mmol/L   Glucose, Bld 124 (H) 65 - 99 mg/dL   BUN <5 (L) 6 - 20 mg/dL   Creatinine, Ser 0.77 0.61 - 1.24 mg/dL   Calcium 7.6 (L) 8.9 - 10.3 mg/dL   GFR calc non Af Amer >60 >60 mL/min   GFR calc Af Amer >60 >60 mL/min    Comment: (NOTE) The  eGFR has been calculated using the CKD EPI equation. This calculation has not been validated in all clinical situations. eGFR's persistently <60 mL/min signify possible Chronic Kidney Disease.    Anion gap 7 5 - 15  Comprehensive metabolic panel     Status: Abnormal   Collection Time: 03/16/15  5:20 AM  Result Value Ref Range   Sodium 133 (L) 135 - 145 mmol/L   Potassium 3.2 (L) 3.5 - 5.1 mmol/L    Comment: DELTA CHECK NOTED   Chloride 97 (L) 101 - 111 mmol/L   CO2 29 22 - 32 mmol/L   Glucose, Bld 120 (H) 65 - 99 mg/dL   BUN <5 (L) 6 - 20 mg/dL   Creatinine, Ser 0.80 0.61 - 1.24 mg/dL   Calcium 7.8 (L) 8.9 - 10.3 mg/dL   Total Protein 5.5 (L) 6.5 - 8.1 g/dL   Albumin 2.2 (L) 3.5 - 5.0 g/dL   AST 16 15 - 41 U/L   ALT 13 (L) 17 - 63 U/L   Alkaline Phosphatase 54 38 - 126 U/L   Total Bilirubin 0.5 0.3 - 1.2 mg/dL   GFR calc non Af Amer >60 >60 mL/min   GFR calc Af Amer >60 >60 mL/min    Comment: (NOTE) The eGFR has been calculated using the CKD EPI equation. This calculation has not been validated in all clinical situations. eGFR's persistently <60 mL/min signify possible Chronic Kidney Disease.    Anion gap 7 5 - 15  CBC     Status: None   Collection Time: 03/16/15  5:20 AM  Result Value Ref Range   WBC 9.7 4.0 - 10.5 K/uL   RBC 4.62 4.22 - 5.81 MIL/uL   Hemoglobin 13.9 13.0 - 17.0 g/dL   HCT 42.3 39.0 - 52.0 %   MCV 91.6 78.0 - 100.0 fL   MCH 30.1 26.0 - 34.0 pg   MCHC 32.9 30.0 - 36.0 g/dL   RDW 13.5 11.5 - 15.5 %   Platelets 372 150 - 400 K/uL  Glucose, capillary     Status: Abnormal   Collection Time: 03/16/15  8:01 AM  Result Value Ref Range   Glucose-Capillary 109 (  H) 65 - 99 mg/dL   Comment 1 Repeat Test     Imaging / Studies: No results found.  Medications / Allergies:  Scheduled Meds: . antiseptic oral rinse  7 mL Mouth Rinse BID  . enoxaparin (LOVENOX) injection  40 mg Subcutaneous Q24H  . potassium chloride  40 mEq Oral Q4H  .  sulfamethoxazole-trimethoprim  1 tablet Oral BID   Continuous Infusions:  PRN Meds:.morphine injection, ondansetron **OR** [DISCONTINUED] ondansetron (ZOFRAN) IV  Antibiotics: Anti-infectives    Start     Dose/Rate Route Frequency Ordered Stop   03/15/15 1130  sulfamethoxazole-trimethoprim (BACTRIM DS,SEPTRA DS) 800-160 MG per tablet 1 tablet     1 tablet Oral 2 times daily 03/15/15 1125     03/13/15 0100  cefoTEtan (CEFOTAN) 2 g in dextrose 5 % 50 mL IVPB     2 g 100 mL/hr over 30 Minutes Intravenous Every 12 hours 03/12/15 1632 03/13/15 0122   03/12/15 1230  cefoTEtan (CEFOTAN) 2 g in dextrose 5 % 50 mL IVPB     2 g 100 mL/hr over 30 Minutes Intravenous On call to O.R. 03/12/15 1221 03/12/15 1450   03/12/15 0330  [MAR Hold]  sulfamethoxazole-trimethoprim (BACTRIM) 160 mg in dextrose 5 % 250 mL IVPB  Status:  Discontinued     (MAR Hold since 03/12/15 1259)   160 mg 260 mL/hr over 60 Minutes Intravenous Every 12 hours 03/12/15 0227 03/12/15 1632        Assessment/Plan Sigmoid mass with obstruction POD#4 sigmoid colectomy/colostomy ---Dr. Hulen Skains -stable, opening up -WOC for ostomy teaching -[facility] for routine ostomy care -await pathology FEN-advance to fulls, will leave up to primary team to replete k.  Add tylenol, tramadol for pain. VTE prophylaxis-SCD/lovenox  Erby Pian, [facility] Surgery Pager [phone]) For consults and floor pages call [phone](7A-4:30P)  03/16/2015 8:23 AM

## 2015-03-16 NOTE — Progress Notes (Signed)
Physical Therapy Treatment Patient Details Name: Greg [name] MRN: [id number] DOB: 03/02/1941 Today's Date: 03/16/2015    History of Present Illness 74 y/o M w/ PMHx of HTN, HLD, [name] w/ atrophic bladder, admitted for obstructive colonic mass. now s/p resection and colostomy    PT Comments    Pt progressing well from mobility stand point and denies pain this date. Acute PT to con't to see to progress independence with mobility.  Follow Up Recommendations  Home health PT;Supervision - Intermittent     Equipment Recommendations  Rolling walker with 5" wheels    Recommendations for Other Services       Precautions / Restrictions Precautions Precautions: Fall Restrictions Weight Bearing Restrictions: No    Mobility  Bed Mobility               General bed mobility comments: received in chair  Transfers Overall transfer level: Needs assistance Equipment used: Rolling walker (2 wheeled) Transfers: Sit to/from Stand Sit to Stand: Min guard         General transfer comment: VCs for hand placement and positioning, patient with posterior weight shift in elevation requiring chair as stabilizing surface initially  Ambulation/Gait Ambulation/Gait assistance: Min guard Ambulation Distance (Feet): 200 Feet Assistive device: Rolling walker (2 wheeled) Gait Pattern/deviations: Step-through pattern;Decreased stride length;Trunk flexed Gait velocity: decreased Gait velocity interpretation: Below normal speed for age/gender General Gait Details: pt with no episodes of LOB or instability just slow and guarded   Stairs            Wheelchair Mobility    Modified Rankin (Stroke Patients Only)       Balance                                    Cognition Arousal/Alertness: Awake/alert Behavior During Therapy: Flat affect Overall Cognitive Status: No family/caregiver present to determine baseline cognitive functioning                       Exercises      General Comments        Pertinent Vitals/Pain Pain Assessment: No/denies pain    Home Living                      Prior Function            PT Goals (current goals can now be found in the care plan section) Acute Rehab PT Goals Patient Stated Goal: go home Progress towards PT goals: Progressing toward goals    Frequency  Min 3X/week    PT Plan Current plan remains appropriate    Co-evaluation             End of Session   Activity Tolerance: Patient tolerated treatment well;No increased pain Patient left: in chair;with call bell/phone within reach;with nursing/sitter in room     Time: 1440-1458 PT Time Calculation (min) (ACUTE ONLY): 18 min  Charges:  $Gait Training: 8-22 mins                    G Codes:      Kingsley Callander 03/16/2015, 4:21 PM   Kittie Plater, PT, DPT Pager #: [phone] Office #: [phone]

## 2015-03-16 NOTE — Progress Notes (Signed)
Inpatient Progress Note - Internal Medicine  Subjective: Today the patient reports pain well under control. He endorses mild abdominal distension. He reports good colostomy output and normal UOP with assisted self-cath. He denies difficulty with ambulation with his walker; he has been using his incentive spirometer. He denies N/V.  Objective: Vital signs in last 24 hours: Filed Vitals:   03/15/15 0608 03/15/15 1330 03/15/15 2159 03/16/15 0630  BP: 138/79 125/80 148/84 129/84  Pulse: 74 72 72 66  Temp: 98.3 F (36.8 C) 97.8 F (36.6 C) 98 F (36.7 C) 98.4 F (36.9 C)  TempSrc: Oral Axillary Oral Oral  Resp: 17 18 16 18   Height:      Weight: 90.3 kg (199 lb 1.2 oz)   92.1 kg (203 lb 0.7 oz)  SpO2: 97% 100% 100% 100%   Weight change: 1.8 kg (3 lb 15.5 oz)  Intake/Output Summary (Last 24 hours) at 03/16/15 1152 Last data filed at 03/16/15 0900  Gross per 24 hour  Intake 2140.83 ml  Output   2325 ml  Net -184.17 ml   General: Calm, conversant man lying in bed HEENT:  EOMI, sclerae/conjunctiva clear, mmm no oropharyngeal erythema Cardiac: RRR, no m/r/g Pulmonary:  CTAB, no increased WOB Abdominal: Mildly TTP L>R, normoactive bowel sounds, colostomy bag in place with a small amount of loose brown stool present. No erythema or induration around colostomy site. Dressings clean, not removed. Extremities: No cyanosis or clubbing. No pedal edema. Neurologic:  Affect and speech content appropriate. Grossly oriented to conversation. Moving all 4 extremities spontaneously.  Lab Results: Basic Metabolic Panel:  Recent Labs Lab 03/15/15 0435 03/15/15 1952 03/16/15 0520  NA 132* 134* 133*  K 2.9* 3.9 3.2*  CL 97* 95* 97*  CO2 29 32 29  GLUCOSE 123* 124* 120*  BUN <5* <5* <5*  CREATININE 0.75 0.77 0.80  CALCIUM 7.5* 7.6* 7.8*  MG 1.9  --  1.8   Liver Function Tests:  Recent Labs Lab 03/12/15 0326 03/16/15 0520  AST 14* 16  ALT 10* 13*  ALKPHOS 54 54  BILITOT 1.0 0.5   PROT 6.5 5.5*  ALBUMIN 3.0* 2.2*   CBC:  Recent Labs Lab 03/11/15 1800  03/14/15 0327 03/16/15 0520  WBC 9.8  < > 10.1 9.7  NEUTROABS 8.4*  --   --   --   HGB 15.2  < > 12.9* 13.9  HCT 45.8  < > 38.3* 42.3  MCV 93.1  < > 90.5 91.6  PLT 335  < > 304 372  < > = values in this interval not displayed. CBG:  Recent Labs Lab 03/12/15 0735 03/13/15 0756 03/14/15 1521 03/15/15 0732 03/16/15 0801  GLUCAP 106* 129* 102* 120* 109*   Medications: I have reviewed the patient's current medications. Scheduled Meds: . antiseptic oral rinse  7 mL Mouth Rinse BID  . enoxaparin (LOVENOX) injection  40 mg Subcutaneous Q24H  . magnesium sulfate 1 - 4 g bolus IVPB  1 g Intravenous Once  . potassium chloride  40 mEq Oral Q4H  . sulfamethoxazole-trimethoprim  1 tablet Oral BID   Continuous Infusions:  PRN Meds:.acetaminophen, morphine injection, ondansetron **OR** [DISCONTINUED] ondansetron (ZOFRAN) IV, traMADol Assessment/Plan: Principal Problem:   Colon obstruction Active Problems:   Essential hypertension   HEMOCCULT POSITIVE STOOL   Colonic mass   Hypokalemia   Lower abdominal pain Lori Liew is a 74 y.o. M p/w abdominal pain and obstipation with known annular constricting sigmoid mass c/f malignancy now s/p hemicolectomy with  colostomy.  Sigmoid Mass with Large Bowel Obstruction: p/w abdominal pain worse with eating, decreased PO, constipation, obstipation. Abd XR and CT showed multiple air-fluid levels, massive colonic dilation proximal to annular constricting sigmoid mass concerning for malignancy. Now POD4 s/p sigmoid colectomy with colostomy. Path pending. - Surgery following, appreciate recs. - Advance diet as tolerated: full liquids today - PT seen, recommend HH PT and walker. [facility] ostomy care also ordered. - Morphine 2q2 PRN ORTramadol 50 mg q6hr PRN OR APAP 325-650 mg qhr PRN for pain - Zofran 4 mg q6hr PRN - OOB, IS  Atrophic Bladder: Chronic, follows with Dr.  Jeffie Pollock of [facility], self-caths at home. On chronic bactrim for UTI PPX. - Continue I&O cath - pt may continue to self-cath with assistance - Continue home Bactrim  Hypokalemia: K 395 5/24 PM. 3.2 this AM. Rate of decrease is slowing. - s/p 40 mEq KCl PO x2 today - recheck BMP 5/26 AM  HTN: Pt normotensive since admission. Home meds = HCTZ 25 mg daily. - Hold home HCTZ  F: d/c IVF E: Hypokalemia as above. CTM N: Full liquids  PPX: SCDs, Lovenox  Dispo: Disposition is deferred at this time, awaiting improvement of current medical problems.  Anticipated discharge in approximately 1-2 day(s).   The patient does have a current PCP Norman Herrlich, MD) and does need an [facility] hospital follow-up appointment after discharge.  The patient does not have transportation limitations that hinder transportation to clinic appointments.  .Services Needed at time of discharge: Y = Yes, Blank = No PT:   OT:   RN:   Equipment:   Other:     LOS: 4 days   Susa Day, Med Student 03/16/2015, 11:52 AM

## 2015-03-16 NOTE — Progress Notes (Signed)
Subjective: He is belching.  Pt states ab pain is not worse, still feels some bloating but no worse. Took pain medicine this am.  Denies nausea/vomiting. Ab pain is 5/10. He tolerated clears and diet advance to fulls this am.   Objective: Vital signs in last 24 hours: Filed Vitals:   03/15/15 0608 03/15/15 1330 03/15/15 2159 03/16/15 0630  BP: 138/79 125/80 148/84 129/84  Pulse: 74 72 72 66  Temp: 98.3 F (36.8 C) 97.8 F (36.6 C) 98 F (36.7 C) 98.4 F (36.9 C)  TempSrc: Oral Axillary Oral Oral  Resp: 17 18 16 18   Height:      Weight: 199 lb 1.2 oz (90.3 kg)   203 lb 0.7 oz (92.1 kg)  SpO2: 97% 100% 100% 100%   Weight change: 3 lb 15.5 oz (1.8 kg)  Intake/Output Summary (Last 24 hours) at 03/16/15 1021 Last data filed at 03/16/15 0622  Gross per 24 hour  Intake 1900.83 ml  Output   1475 ml  Net 425.83 ml   Vitals reviewed. General: resting in bed, NAD HEENT: [place]/at, no scleral icterus Cardiac: RRR, no rubs, murmurs or gallops Pulm: clear to auscultation bilaterally, no wheezes, rales, or rhonchi Abd: soft, nontender, slightly distended, BS present, osteomy bag in place Ext: warm and well perfused, no pedal edema Neuro: alert and oriented X3, cranial nerves II-XII grossly intact, moving all 4 extremities  Lab Results: Basic Metabolic Panel:  Recent Labs Lab 03/15/15 0435 03/15/15 1952 03/16/15 0520  NA 132* 134* 133*  K 2.9* 3.9 3.2*  CL 97* 95* 97*  CO2 29 32 29  GLUCOSE 123* 124* 120*  BUN <5* <5* <5*  CREATININE 0.75 0.77 0.80  CALCIUM 7.5* 7.6* 7.8*  MG 1.9  --  1.8   Liver Function Tests:  Recent Labs Lab 03/12/15 0326 03/16/15 0520  AST 14* 16  ALT 10* 13*  ALKPHOS 54 54  BILITOT 1.0 0.5  PROT 6.5 5.5*  ALBUMIN 3.0* 2.2*   CBC:  Recent Labs Lab 03/11/15 1800  03/14/15 0327 03/16/15 0520  WBC 9.8  < > 10.1 9.7  NEUTROABS 8.4*  --   --   --   HGB 15.2  < > 12.9* 13.9  HCT 45.8  < > 38.3* 42.3  MCV 93.1  < > 90.5 91.6  PLT 335  <  > 304 372  < > = values in this interval not displayed. CBG:  Recent Labs Lab 03/12/15 0735 03/13/15 0756 03/14/15 1521 03/15/15 0732 03/16/15 0801  GLUCAP 106* 129* 102* 120* 109*    Misc. Labs: none  Micro Results: Recent Results (from the past 240 hour(s))  Surgical pcr screen     Status: None   Collection Time: 03/12/15 12:24 PM  Result Value Ref Range Status   MRSA, PCR NEGATIVE NEGATIVE Final   Staphylococcus aureus NEGATIVE NEGATIVE Final    Comment:        The Xpert SA Assay (FDA approved for NASAL specimens in patients over 30 years of age), is one component of a comprehensive surveillance program.  Test performance has been validated by [facility] for patients greater than or equal to 21 year old. It is not intended to diagnose infection nor to guide or monitor treatment.    Studies/Results: No results found. Medications:  Scheduled Meds: . antiseptic oral rinse  7 mL Mouth Rinse BID  . enoxaparin (LOVENOX) injection  40 mg Subcutaneous Q24H  . magnesium sulfate 1 - 4 g bolus  IVPB  1 g Intravenous Once  . potassium chloride  40 mEq Oral Q4H  . sulfamethoxazole-trimethoprim  1 tablet Oral BID   Continuous Infusions:  PRN Meds:.acetaminophen, morphine injection, ondansetron **OR** [DISCONTINUED] ondansetron (ZOFRAN) IV, traMADol Assessment/Plan: 74 y.o with colon mass with obstruction s/p colectomy and colostomy with Hartman procedure 5/21.   #Colon obstruction with osteomy  -Surgery following. Advance diet to fulls. Added Tylenol and Tramadol prn for pain, prn Morphine 2 mg  -will follow in am  -H/H set up for osteomy care   #Atony of bladder -cath i/o prn  -Bactrim chronically   #Essential hypertension -BP controlled w/o meds   #F/E/N -d/c IVF today  -Hypokalemia-replaced with 40 meq x 2, check Mag 1.8 replaced with 1 g  -full liq diet today   Dispo: Disposition is deferred at this time, awaiting improvement of current medical problems.   Anticipated discharge in approximately 2-3 day(s).   The patient does have a current PCP Norman Herrlich, MD) and does need an [facility] hospital follow-up appointment after discharge.  The patient does not have transportation limitations that hinder transportation to clinic appointments.  .Services Needed at time of discharge: Y = Yes, Blank = No PT:   OT:   RN:   Equipment:   Other:     LOS: 4 days   Cresenciano Genre, MD 03/16/2015, 10:21 AM

## 2015-03-17 ENCOUNTER — Inpatient Hospital Stay (HOSPITAL_COMMUNITY): Payer: Medicare Other

## 2015-03-17 DIAGNOSIS — M7989 Other specified soft tissue disorders: Secondary | ICD-10-CM

## 2015-03-17 DIAGNOSIS — C189 Malignant neoplasm of colon, unspecified: Secondary | ICD-10-CM

## 2015-03-17 DIAGNOSIS — M79609 Pain in unspecified limb: Secondary | ICD-10-CM

## 2015-03-17 DIAGNOSIS — M79602 Pain in left arm: Secondary | ICD-10-CM

## 2015-03-17 LAB — GLUCOSE, CAPILLARY
GLUCOSE-CAPILLARY: 111 mg/dL — AB (ref 65–99)
GLUCOSE-CAPILLARY: 90 mg/dL (ref 65–99)
Glucose-Capillary: 101 mg/dL — ABNORMAL HIGH (ref 65–99)

## 2015-03-17 LAB — BASIC METABOLIC PANEL
Anion gap: 7 (ref 5–15)
BUN: 6 mg/dL (ref 6–20)
CALCIUM: 7.8 mg/dL — AB (ref 8.9–10.3)
CHLORIDE: 99 mmol/L — AB (ref 101–111)
CO2: 28 mmol/L (ref 22–32)
CREATININE: 0.79 mg/dL (ref 0.61–1.24)
GFR calc non Af Amer: >60 mL/min (ref >60)
Glucose, Bld: 96 mg/dL (ref 65–99)
POTASSIUM: 3.6 mmol/L (ref 3.5–5.1)
Sodium: 134 mmol/L — ABNORMAL LOW (ref 135–145)

## 2015-03-17 LAB — MAGNESIUM: MAGNESIUM: 1.9 mg/dL (ref 1.7–2.4)

## 2015-03-17 MED ORDER — POTASSIUM CHLORIDE CRYS ER 20 MEQ PO TBCR
40.0000 meq | EXTENDED_RELEASE_TABLET | Freq: Once | ORAL | Status: AC
  Administered 2015-03-17: 40 meq via ORAL
  Filled 2015-03-17: qty 2

## 2015-03-17 NOTE — Consult Note (Addendum)
WOC ostomy follow up Stoma type/location: LLQ, end colostomy Stomal assessment/size: 2" round, slightly above skin level, some mucosal sloughing along the proximal edges Peristomal assessment: intact  Treatment options for stomal/peristomal skin: 2" barrier ring due to liquid stool and stoma low on slight curve in the belly  Output: 200cc liquid brown  Ostomy pouching: One piece pouch with 2" barrier ring.  Education provided: Demonstrated pouch change. Pt watched procedure and asked appropriate questions. I removed the old pouch and cleansed the skin. Cut new wafer to size and explained measuring guide. Applied skin barrier ring and new wafer/ attached pouch. Pt was able to open and close with velcro to empty. Ordered additional supplies for the patients room for bedside nurse use and encouraged pt to practice emptying today when OOB to bathroom.  Recommend home health assistance when discharged. Enrolled patient in [place] Start Discharge program: Yes WOC will follow along with you for continued support with ostomy teaching and care [facility] MSN, [place], [place], [place], [place]

## 2015-03-17 NOTE — Progress Notes (Signed)
  Date: 03/17/2015  Patient name: Greg [name]  Medical record number: [id number]  Date of birth: [birth year]   This patient's plan of care was discussed with the house staff. Please see Dr. Ronnald Ramp' note for complete details. I concur with his findings.  Patient advancing to soft diet today, we discussed as a team his diagnosis of adenocarcinoma and answered all of his questions.     Sid Falcon, MD 03/17/2015, 9:16 PM

## 2015-03-17 NOTE — Progress Notes (Signed)
Subjective: No significant complaints today. Tolerating full liquids. Surgical path shows invasive well differentiated adenocarcinoma.   Objective: Vital signs in last 24 hours: Filed Vitals:   03/16/15 0630 03/16/15 1423 03/16/15 2252 03/17/15 0706  BP: 129/84 137/81 138/90 126/90  Pulse: 66 72 83 80  Temp: 98.4 F (36.9 C) 98.5 F (36.9 C) 98.9 F (37.2 C) 98.3 F (36.8 C)  TempSrc: Oral Oral Oral Oral  Resp: 18 18 17 18   Height:      Weight: 203 lb 0.7 oz (92.1 kg)   195 lb 5.2 oz (88.6 kg)  SpO2: 100% 100% 97% 97%   Weight change:   Intake/Output Summary (Last 24 hours) at 03/17/15 2263 Last data filed at 03/17/15 0245  Gross per 24 hour  Intake    840 ml  Output   2925 ml  Net  -2085 ml   Physical Exam: General: AA male, alert, cooperative, NAD.  HEENT: PERRL, EOMI. 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  Extremities: No cyanosis, clubbing, or edema. LUE w/ new mild swelling and mild tenderness. Warm to the touch.  Neurologic: Alert & oriented x3, cranial nerves II-XII intact, strength grossly intact, sensation intact to light touch   Lab Results: Basic Metabolic Panel:  Recent Labs Lab 03/16/15 0520 03/17/15 0403  NA 133* 134*  K 3.2* 3.6  CL 97* 99*  CO2 29 28  GLUCOSE 120* 96  BUN <5* 6  CREATININE 0.80 0.79  CALCIUM 7.8* 7.8*  MG 1.8 1.9   Liver Function Tests:  Recent Labs Lab 03/12/15 0326 03/16/15 0520  AST 14* 16  ALT 10* 13*  ALKPHOS 54 54  BILITOT 1.0 0.5  PROT 6.5 5.5*  ALBUMIN 3.0* 2.2*   CBC:  Recent Labs Lab 03/11/15 1800  03/14/15 0327 03/16/15 0520  WBC 9.8  < > 10.1 9.7  NEUTROABS 8.4*  --   --   --   HGB  15.2  < > 12.9* 13.9  HCT 45.8  < > 38.3* 42.3  MCV 93.1  < > 90.5 91.6  PLT 335  < > 304 372  < > = values in this interval not displayed.   CBG:  Recent Labs Lab 03/13/15 0756 03/14/15 1521 03/15/15 0732 03/16/15 0801 03/16/15 1304 03/17/15 0758  GLUCAP 129* 102* 120* 109* 117* 101*   Medications: I have reviewed the patient's current medications. Scheduled Meds: . antiseptic oral rinse  7 mL Mouth Rinse BID  . enoxaparin (LOVENOX) injection  40 mg Subcutaneous Q24H  . sulfamethoxazole-trimethoprim  1 tablet Oral BID   Continuous Infusions:   PRN Meds:.acetaminophen, morphine injection, ondansetron **OR** [DISCONTINUED] ondansetron (ZOFRAN) IV, traMADol   Assessment/Plan: 74 y/o M w/ PMHx of HTN, HLD, [name] w/ atrophic bladder, admitted for obstructive colonic mass.   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 Tolerating full liquid diet. Surgical pathology shows invasive adenocarcinoma extending into the serosal layer w/ clear margins. 0/14 LN's positive for disease.  -Advance to soft diet -Continued management per surgery -Morphine prn for pain -HHPT -Will need outpatient oncology follow up  LUE Pain and Swelling: Patient w/ new LUE pain, mild swelling and increased warmth. Pulses palpable. Has been on Lovenox for DVT PPx, however, given presence of a  mucin producing tumor, patient is likely hypercoagulable.  -LUE doppler to rule out DVT  Hypokalemia: K 3.6 this AM.  -Give 40 mg po today -Repeat BMP in AM  HTN: BP stable.  -BP meds on hold for now   Atrophic Bladder: Self-cath per patient. Follows w/ [facility]. Takes bactrim chronically at home. -RN to assist w/ self-cath -Continue Bactrim   DVT/PE PPx: SCD's  Dispo: Disposition is deferred at this time, awaiting improvement of current medical problems.  Anticipated discharge in approximately 1-2 day(s).   The patient does have a current PCP Norman Herrlich, MD) and does need an [facility]  hospital follow-up appointment after discharge.  The patient does not have transportation limitations that hinder transportation to clinic appointments.  .Services Needed at time of discharge: Y = Yes, Blank = No PT: HH  OT:   RN: HH (ostomy)  Equipment:   Other:     LOS: 5 days   Corky Sox, MD 03/17/2015, 8:33 AM

## 2015-03-17 NOTE — Progress Notes (Signed)
Inpatient Progress Note - Internal Medicine  Subjective: Today the patient has no complaints. He denies abdominal bloating and endorses adequate pain control. He reports good mobility with walker and is getting OOB and using IS. He reports good UOP with assisted self-cath and good colostomy output. He denies N/V, CP, SOB. He reports new swelling and discomfort/itching in his lower LUE. Discussed pathology report with pt, who acknowledges understanding. Will continue to f/u with pt regarding his diagnosis.  Objective: Vital signs in last 24 hours: Filed Vitals:   03/16/15 0630 03/16/15 1423 03/16/15 2252 03/17/15 0706  BP: 129/84 137/81 138/90 126/90  Pulse: 66 72 83 80  Temp: 98.4 F (36.9 C) 98.5 F (36.9 C) 98.9 F (37.2 C) 98.3 F (36.8 C)  TempSrc: Oral Oral Oral Oral  Resp: 18 18 17 18   Height:      Weight: 92.1 kg (203 lb 0.7 oz)   88.6 kg (195 lb 5.2 oz)  SpO2: 100% 100% 97% 97%   Weight change:   Intake/Output Summary (Last 24 hours) at 03/17/15 0855 Last data filed at 03/17/15 0245  Gross per 24 hour  Intake    840 ml  Output   2925 ml  Net  -2085 ml   General: Calm, conversant, cooperative elderly man lying comfortably in bed. HEENT:  EOMI, sclerae/conjunctiva clear, mmm no oropharyngeal erythema Cardiac: RRR, no m/r/g Pulmonary:  CTAB, no increased WOB Abdominal: soft, nondistended, mildly TTP less than yesterday, normoactive bowel sounds, dressings not removed, colostomy bag in place with a large amount of thin, dark brown stool. Extremities: No cyanosis or clubbing. No pedal edema. 2+ bilateral dorsal pedal pulses Neurologic:  Grossly oriented to conversation. Moving all 4 limbs spontaneously.  Lab Results: Basic Metabolic Panel:  Recent Labs Lab 03/16/15 0520 03/17/15 0403  NA 133* 134*  K 3.2* 3.6  CL 97* 99*  CO2 29 28  GLUCOSE 120* 96  BUN <5* 6  CREATININE 0.80 0.79  CALCIUM 7.8* 7.8*  MG 1.8 1.9   Micro Results: Recent Results (from the  past 240 hour(s))  Surgical pcr screen     Status: None   Collection Time: 03/12/15 12:24 PM  Result Value Ref Range Status   MRSA, PCR NEGATIVE NEGATIVE Final   Staphylococcus aureus NEGATIVE NEGATIVE Final    Comment:        The Xpert SA Assay (FDA approved for NASAL specimens in patients over 3 years of age), is one component of a comprehensive surveillance program.  Test performance has been validated by [facility] for patients greater than or equal to 31 year old. It is not intended to diagnose infection nor to guide or monitor treatment.    Studies/Results: No results found. Medications: I have reviewed the patient's current medications. Scheduled Meds: . antiseptic oral rinse  7 mL Mouth Rinse BID  . enoxaparin (LOVENOX) injection  40 mg Subcutaneous Q24H  . sulfamethoxazole-trimethoprim  1 tablet Oral BID   Continuous Infusions:  PRN Meds:.acetaminophen, morphine injection, ondansetron **OR** [DISCONTINUED] ondansetron (ZOFRAN) IV, traMADol Assessment/Plan: Principal Problem:   Colon obstruction Active Problems:   Essential hypertension   HEMOCCULT POSITIVE STOOL   Colonic mass   Hypokalemia   Lower abdominal pain Greg [name] is a 74 y.o. M with newly diagnosed pT4a pN1c adenocarcinoma of sigmoid colon p/w abdominal pain and obstipation now s/p hemicolectomy with colostomy.  pT4apN1c Sigmoid Adenocarcinoma s/p sigmoid colectomy/colostomy: p/w abdominal pain worse with eating, decreased PO, constipation, obstipation. Abd XR and CT showed multiple  air-fluid levels, massive colonic dilation proximal to annular constricting sigmoid mass. Now POD5 s/p sigmoid colectomy with colostomy. Path pending. - Surgery following, appreciate recs. - Advance diet as tolerated: soft diet today - PT seen, recommend HH PT and walker. [facility] ostomy care also ordered. - Morphine 2q2 PRN ORTramadol 50 mg q6hr PRN OR APAP 325-650 mg qhr PRN for pain - Zofran 4 mg q6hr PRN - OOB, IS -  Pt will f/u with oncology after d/c for chemo discussion - Pathology report:  - INVASIVE WELL-DIFFERENTIATED ADENOCARCINOMA WITH ASSOCIATED ABUNDANT MUCIN, SPANNING 7 CM IN GREATEST DIMENSION  - TUMOR INVADES THROUGH MUSCULARIS PROPRIA THROUGH PERICOLORECTAL SOFT TISSUES TO INVOLVE SEROSAL SURFACE  - MARGINS ARE NEGATIVE  - FOURTEEN BENIGN LYMPH NODES WITH NO TUMOR SEEN (0/14)  - SINGLE SOFT TISSUE SATELLITE TUMOR NODULE / TUMOR DEPOSIT IS PRESENT  LUE Swelling: Today pt endorses LUE swelling and itching/pain. He previously had an IV in LUE, none now. - LUE venous dopplers for ?venous thrombosis  Atrophic Bladder: Chronic, follows with Dr. Jeffie Pollock of [facility], self-caths at home. On chronic bactrim for UTI PPX. - Continue I&O cath - pt may continue to self-cath with assistance - Continue home Bactrim  Hypokalemia: K 395 5/24 PM. 3.6 this AM. Rate of decrease is slowing. - 40 mEq KCl PO x1 today - recheck BMP 5/27 AM  HTN: Pt normotensive since admission. Home meds = HCTZ 25 mg daily. - Hold home HCTZ  F: d/c IVF E: Hypokalemia as above. CTM N: Full liquids  PPX: SCDs, Lovenox  Dispo: Disposition is deferred at this time, awaiting improvement of current medical problems.  Anticipated discharge in approximately 1-2 day(s).   The patient does have a current PCP Norman Herrlich, MD) and does need an [facility] hospital follow-up appointment after discharge.  The patient does not have transportation limitations that hinder transportation to clinic appointments.  .Services Needed at time of discharge: Y = Yes, Blank = No PT:   OT:   RN:   Equipment:   Other:     LOS: 5 days   Susa Day, Med Student 03/17/2015, 8:55 AM

## 2015-03-17 NOTE — Progress Notes (Signed)
Patient ID: Greg [name], male   DOB: [birth year], 74 y.o.   MRN: [id number] 5 Days Post-Op  Subjective: Pt feels well today. Doesn't feel bloated.  Tolerating full liquids.  Objective: Vital signs in last 24 hours: Temp:  [98.3 F (36.8 C)-98.9 F (37.2 C)] 98.3 F (36.8 C) (05/26 0706) Pulse Rate:  [72-83] 80 (05/26 0706) Resp:  [17-18] 18 (05/26 0706) BP: (126-138)/(81-90) 126/90 mmHg (05/26 0706) SpO2:  [97 %-100 %] 97 % (05/26 0706) Weight:  [88.6 kg (195 lb 5.2 oz)] 88.6 kg (195 lb 5.2 oz) (05/26 0706) Last BM Date: 03/17/15  Intake/Output from previous day: 05/25 0701 - 05/26 0700 In: 840 [P.O.:840] Out: 2925 [Urine:2050; Stool:875] Intake/Output this shift:    PE: Abd: soft, appropriately tender, incision is c/d/i with staples present.  Colostomy is full of liquids bilious output and starting to leak on the medial side as it is almost completely full.  Lab Results:   Recent Labs  03/16/15 0520  WBC 9.7  HGB 13.9  HCT 42.3  PLT 372   BMET  Recent Labs  03/16/15 0520 03/17/15 0403  NA 133* 134*  K 3.2* 3.6  CL 97* 99*  CO2 29 28  GLUCOSE 120* 96  BUN <5* 6  CREATININE 0.80 0.79  CALCIUM 7.8* 7.8*   PT/INR No results for input(s): LABPROT, INR in the last 72 hours. CMP     Component Value Date/Time   NA 134* 03/17/2015 0403   K 3.6 03/17/2015 0403   CL 99* 03/17/2015 0403   CO2 28 03/17/2015 0403   GLUCOSE 96 03/17/2015 0403   BUN 6 03/17/2015 0403   CREATININE 0.79 03/17/2015 0403   CREATININE 0.97 03/11/2014 1423   CALCIUM 7.8* 03/17/2015 0403   PROT 5.5* 03/16/2015 0520   ALBUMIN 2.2* 03/16/2015 0520   AST 16 03/16/2015 0520   ALT 13* 03/16/2015 0520   ALKPHOS 54 03/16/2015 0520   BILITOT 0.5 03/16/2015 0520   GFRNONAA >60 03/17/2015 0403   GFRNONAA 79 09/03/2013 1117   GFRAA >60 03/17/2015 0403   GFRAA >89 09/03/2013 1117   Lipase     Component Value Date/Time   LIPASE 26 02/27/2015 2002       Studies/Results: No results  found.  Anti-infectives: Anti-infectives    Start     Dose/Rate Route Frequency Ordered Stop   03/15/15 1130  sulfamethoxazole-trimethoprim (BACTRIM DS,SEPTRA DS) 800-160 MG per tablet 1 tablet     1 tablet Oral 2 times daily 03/15/15 1125     03/13/15 0100  cefoTEtan (CEFOTAN) 2 g in dextrose 5 % 50 mL IVPB     2 g 100 mL/hr over 30 Minutes Intravenous Every 12 hours 03/12/15 1632 03/13/15 0122   03/12/15 1230  cefoTEtan (CEFOTAN) 2 g in dextrose 5 % 50 mL IVPB     2 g 100 mL/hr over 30 Minutes Intravenous On call to O.R. 03/12/15 1221 03/12/15 1450   03/12/15 0330  [MAR Hold]  sulfamethoxazole-trimethoprim (BACTRIM) 160 mg in dextrose 5 % 250 mL IVPB  Status:  Discontinued     (MAR Hold since 03/12/15 1259)   160 mg 260 mL/hr over 60 Minutes Intravenous Every 12 hours 03/12/15 0227 03/12/15 1632       Assessment/Plan   Sigmoid mass with obstruction POD#5 sigmoid colectomy/colostomy ---Dr. Hulen Skains -advance to soft diet today -WOC for ostomy teaching -[facility] for routine ostomy care, already arranged -Pathology:  T4, N1c - INVASIVE WELL-DIFFERENTIATED ADENOCARCINOMA WITH ASSOCIATED ABUNDANT MUCIN, SPANNING 7 CM  IN GREATEST DIMENSION. - TUMOR INVADES THROUGH MUSCULARIS PROPRIA THROUGH PERICOLORECTAL SOFT TISSUES TO INVOLVE SEROSAL SURFACE. - MARGINS ARE NEGATIVE. - FOURTEEN BENIGN LYMPH NODES WITH NO TUMOR SEEN (0/14). - SINGLE SOFT TISSUE SATELLITE TUMOR NODULE / TUMOR DEPOSIT IS PRESENT. -will need oncology follow up as an outpatient VTE prophylaxis-SCD/lovenox  LOS: 5 days    Greg [name] E 03/17/2015, 9:07 AM Pager: [phone]

## 2015-03-17 NOTE — Progress Notes (Signed)
*  PRELIMINARY RESULTS* Vascular Ultrasound Left upper extremity venous duplex has been completed.  Preliminary findings: No evidence of DVT. Superficial thrombosis is noted in the Left cephalic vein, from wrist to just below AC fossa.   Landry Mellow, RDMS, RVT  03/17/2015, 1:43 PM

## 2015-03-18 ENCOUNTER — Telehealth: Payer: Self-pay | Admitting: Hematology

## 2015-03-18 LAB — BASIC METABOLIC PANEL
Anion gap: 8 (ref 5–15)
BUN: 13 mg/dL (ref 6–20)
CALCIUM: 7.9 mg/dL — AB (ref 8.9–10.3)
CO2: 24 mmol/L (ref 22–32)
CREATININE: 0.93 mg/dL (ref 0.61–1.24)
Chloride: 100 mmol/L — ABNORMAL LOW (ref 101–111)
GFR calc Af Amer: >60 mL/min (ref >60)
Glucose, Bld: 97 mg/dL (ref 65–99)
POTASSIUM: 4.2 mmol/L (ref 3.5–5.1)
Sodium: 132 mmol/L — ABNORMAL LOW (ref 135–145)

## 2015-03-18 LAB — GLUCOSE, CAPILLARY: GLUCOSE-CAPILLARY: 95 mg/dL (ref 65–99)

## 2015-03-18 NOTE — Progress Notes (Signed)
Patient ID: Natanel Snavely, male   DOB: December 12, 1940, 74 y.o.   MRN: [id number] 6 Days Post-Op  Subjective: Pt tolerating soft diet.  Not a great appetite, as expected.    Objective: Vital signs in last 24 hours: Temp:  [98.4 F (36.9 C)-98.7 F (37.1 C)] 98.7 F (37.1 C) (05/27 0559) Pulse Rate:  [70-94] 82 (05/27 0559) Resp:  [16-18] 16 (05/27 0559) BP: (127-135)/(73-85) 131/85 mmHg (05/27 0559) SpO2:  [97 %-100 %] 97 % (05/27 0559) Weight:  [82 kg (180 lb 12.4 oz)] 82 kg (180 lb 12.4 oz) (05/27 0559) Last BM Date: 03/17/15  Intake/Output from previous day: 05/26 0701 - 05/27 0700 In: 1440 [P.O.:1440] Out: 2750 [Urine:1475; Stool:1275] Intake/Output this shift:    PE: Abd: soft, less distended, +BS, incision c/d/i with staples, ostomy working well with a pink and viable stoma  Lab Results:   Recent Labs  03/16/15 0520  WBC 9.7  HGB 13.9  HCT 42.3  PLT 372   BMET  Recent Labs  03/17/15 0403 03/18/15 0342  NA 134* 132*  K 3.6 4.2  CL 99* 100*  CO2 28 24  GLUCOSE 96 97  BUN 6 13  CREATININE 0.79 0.93  CALCIUM 7.8* 7.9*   PT/INR No results for input(s): LABPROT, INR in the last 72 hours. CMP     Component Value Date/Time   NA 132* 03/18/2015 0342   K 4.2 03/18/2015 0342   CL 100* 03/18/2015 0342   CO2 24 03/18/2015 0342   GLUCOSE 97 03/18/2015 0342   BUN 13 03/18/2015 0342   CREATININE 0.93 03/18/2015 0342   CREATININE 0.97 03/11/2014 1423   CALCIUM 7.9* 03/18/2015 0342   PROT 5.5* 03/16/2015 0520   ALBUMIN 2.2* 03/16/2015 0520   AST 16 03/16/2015 0520   ALT 13* 03/16/2015 0520   ALKPHOS 54 03/16/2015 0520   BILITOT 0.5 03/16/2015 0520   GFRNONAA >60 03/18/2015 0342   GFRNONAA 79 09/03/2013 1117   GFRAA >60 03/18/2015 0342   GFRAA >89 09/03/2013 1117   Lipase     Component Value Date/Time   LIPASE 26 02/27/2015 2002       Studies/Results: No results found.  Anti-infectives: Anti-infectives    Start     Dose/Rate Route Frequency  Ordered Stop   03/15/15 1130  sulfamethoxazole-trimethoprim (BACTRIM DS,SEPTRA DS) 800-160 MG per tablet 1 tablet     1 tablet Oral 2 times daily 03/15/15 1125     03/13/15 0100  cefoTEtan (CEFOTAN) 2 g in dextrose 5 % 50 mL IVPB     2 g 100 mL/hr over 30 Minutes Intravenous Every 12 hours 03/12/15 1632 03/13/15 0122   03/12/15 1230  cefoTEtan (CEFOTAN) 2 g in dextrose 5 % 50 mL IVPB     2 g 100 mL/hr over 30 Minutes Intravenous On call to O.R. 03/12/15 1221 03/12/15 1450   03/12/15 0330  [MAR Hold]  sulfamethoxazole-trimethoprim (BACTRIM) 160 mg in dextrose 5 % 250 mL IVPB  Status:  Discontinued     (MAR Hold since 03/12/15 1259)   160 mg 260 mL/hr over 60 Minutes Intravenous Every 12 hours 03/12/15 0227 03/12/15 1632       Assessment/Plan    Sigmoid mass with obstruction POD#5 sigmoid colectomy/colostomy ---Dr. 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 - TUMOR INVADES THROUGH MUSCULARIS PROPRIA THROUGH PERICOLORECTAL SOFT TISSUES TO  INVOLVE SEROSAL SURFACE. - MARGINS ARE NEGATIVE. - FOURTEEN BENIGN LYMPH NODES WITH NO TUMOR SEEN (0/14). - SINGLE SOFT TISSUE SATELLITE TUMOR NODULE / TUMOR DEPOSIT IS PRESENT.  -will need oncology follow up as an outpatient, our office is taking care of this VTE prophylaxis-SCD/lovenox Patient would like another day to learn about his ostomy, etc prior to discharge.  I think this is appropriate since he lives by himself.  Anticipate DC likely tomorrow if medically stable.  LOS: 6 days    Arel Tippen E 03/18/2015, 8:21 AM Pager: [phone]

## 2015-03-18 NOTE — Progress Notes (Signed)
Physical Therapy Treatment Patient Details Name: Greg [name] MRN: [id number] DOB: 1941/03/27 Today's Date: Mar 19, 2015    History of Present Illness 74 y/o M w/ PMHx of HTN, HLD, [name] w/ atrophic bladder, admitted for obstructive colonic mass. now s/p resection and colostomy    PT Comments    Patient mobilizing well in good spirits. Patient ambulated increased distance with continued improvements with stability. Performed single step x3 with out difficulty. Patient hopeful to return home tomorrow.  Follow Up Recommendations  Home health PT;Supervision - Intermittent     Equipment Recommendations  Rolling walker with 5" wheels    Recommendations for Other Services       Precautions / Restrictions Precautions Precautions: Fall Restrictions Weight Bearing Restrictions: No    Mobility  Bed Mobility               General bed mobility comments: received in chair  Transfers Overall transfer level: Needs assistance Equipment used: Rolling walker (2 wheeled)   Sit to Stand: Min guard         General transfer comment: VCs for hand placement and positioning, patient with posterior weight shift in elevation requiring chair as stabilizing surface initially  Ambulation/Gait Ambulation/Gait assistance: Supervision Ambulation Distance (Feet): 260 Feet Assistive device: Rolling walker (2 wheeled) Gait Pattern/deviations: Step-through pattern;Decreased stride length;Trunk flexed Gait velocity: decreased Gait velocity interpretation: Below normal speed for age/gender General Gait Details: no significant LOB, able to perform directional changes without any noted LOB    Stairs Stairs: Yes Stairs assistance: Supervision Stair Management: No rails Number of Stairs: 1 General stair comments: step over performed x3  Wheelchair Mobility    Modified Rankin (Stroke Patients Only)       Balance     Sitting balance-Leahy Scale: Good       Standing balance-Leahy  Scale: Fair (with use of RW)                      Cognition Arousal/Alertness: Awake/alert Behavior During Therapy: Flat affect Overall Cognitive Status: No family/caregiver present to determine baseline cognitive functioning                      Exercises      General Comments        Pertinent Vitals/Pain Pain Assessment: No/denies pain    Home Living                      Prior Function            PT Goals (current goals can now be found in the care plan section) Acute Rehab PT Goals Patient Stated Goal: go home PT Goal Formulation: With patient Time For Goal Achievement: 03/28/15 Potential to Achieve Goals: Good Progress towards PT goals: Progressing toward goals    Frequency  Min 3X/week    PT Plan Current plan remains appropriate    Co-evaluation             End of Session Equipment Utilized During Treatment: Gait belt Activity Tolerance: Patient tolerated treatment well;No increased pain Patient left: in chair;with call bell/phone within reach;with nursing/sitter in room     Time: 1520-1536 PT Time Calculation (min) (ACUTE ONLY): 16 min  Charges:  $Gait Training: 8-22 mins                    G CodesDuncan Dull 03/19/2015, 3:50 PM Alben Deeds, PT DPT  [phone]   

## 2015-03-18 NOTE — Care Management Note (Signed)
Case Management Note  Patient Details  Name: Greg [name] MRN: [id number] Date of Birth: [birth year]  Subjective/Objective:                    Action/Plan:   Expected Discharge Date:     03-19-15              Expected Discharge Plan:  [place]  In-House Referral:     Discharge planning Services  CM Consult  Post Acute Care Choice:  Home Health, Durable Medical Equipment Choice offered to:  Patient  DME Arranged:  Walker rolling DME Agency:  [place] Arranged:  RN, PT [facility] Agency:  [place]  Status of Service:  In process, will continue to follow  Medicare Important Message Given:    Date Medicare IM Given:  03/15/15 Medicare IM give by:  Magdalen Spatz RN BSN  Date Additional Medicare IM Given:  03/18/15 Additional Medicare Important Message give by:  Magdalen Spatz RN BSN   If discussed at [place] of Stay Meetings, dates discussed:    Additional Comments:  Marilu Favre, RN 03/18/2015, 9:40 AM

## 2015-03-18 NOTE — Telephone Encounter (Signed)
new patient appt-patient scheduled for 06/14 @ 11 w/Dr. Burr Medico.

## 2015-03-18 NOTE — Progress Notes (Signed)
  Date: 03/18/2015  Patient name: Greg [name]  Medical record number: [id number]  Date of birth: Dec 26, 1940   This patient's plan of care was discussed with the house staff. Please see Dr. Ronnald Ramp' note for complete details. I concur with his findings.  Update to note, K is normal, no need to supplement today.  Patient is nearing discharge.  He needs good coordinated care and teaching about how to take care of his ostomy. F/U with oncology is planned.     Sid Falcon, MD 03/18/2015, 2:29 PM

## 2015-03-18 NOTE — Progress Notes (Signed)
Subjective: Doing quite well. Says he is feeling very comfortable w/ changing his colostomy. No abdominal pain.   Objective: Vital signs in last 24 hours: Filed Vitals:   03/17/15 0706 03/17/15 1418 03/17/15 2102 03/18/15 0559  BP: 126/90 127/73 135/84 131/85  Pulse: 80 70 94 82  Temp: 98.3 F (36.8 C) 98.4 F (36.9 C) 98.4 F (36.9 C) 98.7 F (37.1 C)  TempSrc: Oral Oral Oral Oral  Resp: 18 18 16 16   Height:      Weight: 195 lb 5.2 oz (88.6 kg)   180 lb 12.4 oz (82 kg)  SpO2: 97%  100% 97%   Weight change:   Intake/Output Summary (Last 24 hours) at 03/18/15 1310 Last data filed at 03/18/15 0606  Gross per 24 hour  Intake   1200 ml  Output   1275 ml  Net    -75 ml   Physical Exam: General: AA male, alert, cooperative, NAD.  HEENT: PERRL, EOMI. 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  Extremities: No cyanosis, clubbing, or edema. LUE still w/ mild swelling and mild tenderness.  Neurologic: Alert & oriented x3, cranial nerves II-XII intact, strength grossly intact, sensation intact to light touch   Lab Results: Basic Metabolic Panel:  Recent Labs Lab 03/16/15 0520 03/17/15 0403 03/18/15 0342  NA 133* 134* 132*  K 3.2* 3.6 4.2  CL 97* 99* 100*  CO2 29 28 24   GLUCOSE 120* 96 97  BUN <5* 6 13  CREATININE 0.80 0.79 0.93  CALCIUM 7.8* 7.8* 7.9*  MG 1.8 1.9  --    Liver Function Tests:  Recent Labs Lab 03/12/15 0326 03/16/15 0520  AST 14* 16  ALT 10* 13*  ALKPHOS 54 54  BILITOT 1.0 0.5  PROT 6.5 5.5*  ALBUMIN 3.0* 2.2*   CBC:  Recent Labs Lab 03/11/15 1800  03/14/15 0327 03/16/15 0520  WBC 9.8  < > 10.1 9.7  NEUTROABS 8.4*  --   --   --    HGB 15.2  < > 12.9* 13.9  HCT 45.8  < > 38.3* 42.3  MCV 93.1  < > 90.5 91.6  PLT 335  < > 304 372  < > = values in this interval not displayed.   CBG:  Recent Labs Lab 03/16/15 0801 03/16/15 1304 03/17/15 0758 03/17/15 1209 03/17/15 1701 03/18/15 0746  GLUCAP 109* 117* 101* 111* 90 95   Medications: I have reviewed the patient's current medications. Scheduled Meds: . antiseptic oral rinse  7 mL Mouth Rinse BID  . enoxaparin (LOVENOX) injection  40 mg Subcutaneous Q24H  . sulfamethoxazole-trimethoprim  1 tablet Oral BID   Continuous Infusions:   PRN Meds:.acetaminophen, morphine injection, ondansetron **OR** [DISCONTINUED] ondansetron (ZOFRAN) IV, traMADol   Assessment/Plan: 74 y/o M w/ PMHx of HTN, HLD, [name] w/ atrophic bladder, admitted for obstructive colonic mass.   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 Tolerating full liquid diet. Surgical pathology shows invasive adenocarcinoma extending into the serosal layer w/ clear margins. 0/14 LN's positive for disease.  -Continue soft diet -Tramadol prn for pain -HHPT, Aide, RN -Will need outpatient oncology follow up; 04/05/15 @ 11:30 AM w/ Dr. Burr Medico  LUE Pain and Swelling: UE doppler shows superficial thrombosis.  -Continue to  monitor for thrombophlebitis -Warm compresses  Hypokalemia: K 4.2. Stable. -Give 40 mg po today -Repeat BMP in AM  HTN: BP stable.  -BP meds on hold for now   Atrophic Bladder: Self-cath per patient. Follows w/ [facility]. Takes bactrim chronically at home. -RN to assist w/ self-cath -Continue Bactrim   DVT/PE PPx: SCD's  Dispo: Anticipated discharge tomorrow.   The patient does have a current PCP Norman Herrlich, MD) and does need an [facility] hospital follow-up appointment after discharge.  The patient does not have transportation limitations that hinder transportation to clinic appointments.  .Services Needed at time of discharge: Y = Yes, Blank = No PT: HH  OT:     RN: HH (ostomy)  Equipment:   Other:     LOS: 6 days   Corky Sox, MD 03/18/2015, 1:10 PM

## 2015-03-18 NOTE — Progress Notes (Signed)
Inpatient Progress Note - Internal Medicine  Subjective: This morning the patient feels well and has no complaints as usual. He denies abdominal or chest pain. He is getting comfortable with changing his ostomy bag and feels ready to do so at home. He reports no difficulty ambulating with walker. He is not ready to go home today; he reports he can get a ride home tomorrow. I spoke with the patient at length about the changes to his lifestyle after he returns home; he reports feeling comfortable with caring for himself at home. He has no further questions about his diagnosis. He is in good spirits today.  Objective: Vital signs in last 24 hours: Filed Vitals:   03/17/15 0706 03/17/15 1418 03/17/15 2102 03/18/15 0559  BP: 126/90 127/73 135/84 131/85  Pulse: 80 70 94 82  Temp: 98.3 F (36.8 C) 98.4 F (36.9 C) 98.4 F (36.9 C) 98.7 F (37.1 C)  TempSrc: Oral Oral Oral Oral  Resp: 18 18 16 16   Height:      Weight: 88.6 kg (195 lb 5.2 oz)   82 kg (180 lb 12.4 oz)  SpO2: 97%  100% 97%   Weight change:   Intake/Output Summary (Last 24 hours) at 03/18/15 7408 Last data filed at 03/18/15 0606  Gross per 24 hour  Intake   1440 ml  Output   2750 ml  Net  -1310 ml   General: Calm, conversant elderly man sitting up in a chair comfortably. HEENT:  EOMI, sclerae/conjunctiva clear, mmm no oropharyngeal erythema Cardiac: RRR, no m/r/g Pulmonary:  CTAB, no increased WOB Abdominal: soft, nt/nd, ostomy bag in place with a moderate amount of dark brown liquid stool, no dressings on incision, incision c/d/i with staples in place, normoactive bowel sounds Extremities: No cyanosis or clubbing. No pedal edema. Neurologic:  Appropriate affect and speech content. Grossly oriented per conversation. Moving all 4 limbs spontaneously.  Lab Results: Basic Metabolic Panel:  Recent Labs Lab 03/16/15 0520 03/17/15 0403 03/18/15 0342  NA 133* 134* 132*  K 3.2* 3.6 4.2  CL 97* 99* 100*  CO2 29 28 24    GLUCOSE 120* 96 97  BUN <5* 6 13  CREATININE 0.80 0.79 0.93  CALCIUM 7.8* 7.8* 7.9*  MG 1.8 1.9  --    CBG:  Recent Labs Lab 03/16/15 0801 03/16/15 1304 03/17/15 0758 03/17/15 1209 03/17/15 1701 03/18/15 0746  GLUCAP 109* 117* 101* 111* 90 95   Micro Results: Recent Results (from the past 240 hour(s))  Surgical pcr screen     Status: None   Collection Time: 03/12/15 12:24 PM  Result Value Ref Range Status   MRSA, PCR NEGATIVE NEGATIVE Final   Staphylococcus aureus NEGATIVE NEGATIVE Final    Comment:        The Xpert SA Assay (FDA approved for NASAL specimens in patients over 71 years of age), is one component of a comprehensive surveillance program.  Test performance has been validated by [facility] for patients greater than or equal to 69 year old. It is not intended to diagnose infection nor to guide or monitor treatment.    Studies/Results: No results found. Medications: I have reviewed the patient's current medications. Scheduled Meds: . antiseptic oral rinse  7 mL Mouth Rinse BID  . enoxaparin (LOVENOX) injection  40 mg Subcutaneous Q24H  . sulfamethoxazole-trimethoprim  1 tablet Oral BID   Continuous Infusions:  PRN Meds:.acetaminophen, morphine injection, ondansetron **OR** [DISCONTINUED] ondansetron (ZOFRAN) IV, traMADol Assessment/Plan: Principal Problem:   Colon obstruction Active  Problems:   Essential hypertension   HEMOCCULT POSITIVE STOOL   Colonic mass   Hypokalemia   Lower abdominal pain Ruth Tully is a 74 y.o. M with newly diagnosed pT4a pN1c adenocarcinoma of sigmoid colon p/w abdominal pain and obstipation now s/p hemicolectomy with colostomy.  pT4apN1c Sigmoid Adenocarcinoma s/p sigmoid colectomy/colostomy: p/w abdominal pain worse with eating, decreased PO, constipation, obstipation. Abd XR and CT showed multiple air-fluid levels, massive colonic dilation proximal to annular constricting sigmoid mass. Now POD6 s/p sigmoid  colectomy with colostomy. Path pending. - Surgery following, appreciate recs. - Advance diet as tolerated: continue soft diet today - PT seen, recommend HH PT and walker. [facility] ostomy care also ordered. 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 Venous dopplers performed:  No DVT, superficial thrombosis in L cephalic vein from [facility] fossa to wrist. - Warm compresses  Atrophic Bladder: Chronic, follows with Dr. Jeffie Pollock of [facility], self-caths at home. On chronic bactrim for UTI PPX. - Continue I&O self-cath with assistance - Continue home Bactrim  Hypokalemia: K 4.2 this AM from 3.6. - K WNL. Re-check tomorrow AM.  HTN: Pt normotensive since admission. Home meds = HCTZ 25 mg daily. - Hold home HCTZ  F: None E: Hypokalemia as above. CTM N: Soft  PPX: SCDs, Lovenox  Dispo: Disposition is deferred at this time, awaiting improvement of current medical problems.  Anticipated discharge in approximately 1 day(s).   The patient does have a current PCP Norman Herrlich, MD) and does need an [facility] hospital follow-up appointment after discharge.  The patient does not have transportation limitations that hinder transportation to clinic appointments.  .Services Needed at time of discharge: Y = Yes, Blank = No PT:   OT:   RN:   Equipment:   Other:     LOS: 6 days    Susa Day, Med Student 03/18/2015, 8:28 AM

## 2015-03-19 DIAGNOSIS — Z85038 Personal history of other malignant neoplasm of large intestine: Secondary | ICD-10-CM

## 2015-03-19 DIAGNOSIS — C187 Malignant neoplasm of sigmoid colon: Secondary | ICD-10-CM

## 2015-03-19 LAB — GLUCOSE, CAPILLARY: GLUCOSE-CAPILLARY: 93 mg/dL (ref 65–99)

## 2015-03-19 MED ORDER — TRAMADOL HCL 50 MG PO TABS: 50.0000 mg | ORAL_TABLET | Freq: Four times a day (QID) | ORAL | Status: DC | PRN

## 2015-03-19 NOTE — Progress Notes (Signed)
Pt taken out in w/c to ride home with [employer] service.  Pt has supplies for ostomy, knows how to open and close ostomy and has ost. Bag for changing if needed.  Rx for ultram given and explained.  DC instructions and FU appts all reviewed and given copy of.

## 2015-03-19 NOTE — Progress Notes (Signed)
7 Days Post-Op  Subjective: Patient wants to go home. Internal medicine teaching service feels that he is stable to go home from their standpoint. Tolerating regular diet. Ostomy working Good pain control Ambulates with walker He states that arrangements are made with [facility] for daily home health nursing and, wound ostomy care. Follow-up arrangements have been made with our office for staple removal 03/22/2015,   Dr. Hulen Skains on 04/05/2015,  his PCP Dr. Luanne Bras on 04/01/2015, and Dr. Burr Medico in the medical oncology group on 04/05/2015  Objective: Vital signs in last 24 hours: Temp:  [97.4 F (36.3 C)-98.9 F (37.2 C)] 98.9 F (37.2 C) (05/28 0608) Pulse Rate:  [82-89] 82 (05/28 0608) Resp:  [18-19] 19 (05/28 0608) BP: (120-140)/(84-90) 120/84 mmHg (05/28 0608) SpO2:  [98 %-99 %] 99 % (05/28 0608) Last BM Date: 03/17/15  Intake/Output from previous day: 05/27 0701 - 05/28 0700 In: 1200 [P.O.:1200] Out: 1700 [Urine:1700] Intake/Output this shift:    General appearance: Alert. Cooperative. Stable. No distress. GI: abdomen is soft. Nondistended. Nontender. Midline wounds clean, no drainage or infection, staples in place. Ostomy on left side healthy with stool in bag.  Lab Results:  Results for orders placed or performed during the hospital encounter of 03/11/15 (from the past 24 hour(s))  Glucose, capillary     Status: None   Collection Time: 03/19/15  7:43 AM  Result Value Ref Range   Glucose-Capillary 93 65 - 99 mg/dL     Studies/Results: No results found.  Marland Kitchen antiseptic oral rinse  7 mL Mouth Rinse BID  . enoxaparin (LOVENOX) injection  40 mg Subcutaneous Q24H  . sulfamethoxazole-trimethoprim  1 tablet Oral BID     Assessment/Plan: s/p Procedure(s): EXPLORATORY LAPAROTOMY  RESECTION SIGMOID COLON COLOSTOMY    POD#6 sigmoid colectomy/colostomy ---Dr. Hulen Skains Home [facility]  for routine ostomy care, already arranged -Pathology: T4, N1c - INVASIVE  WELL-DIFFERENTIATED ADENOCARCINOMA WITH ASSOCIATED ABUNDANT MUCIN, SPANNING 7 CM IN GREATEST DIMENSION. - TUMOR INVADES THROUGH MUSCULARIS PROPRIA THROUGH PERICOLORECTAL SOFT TISSUES TO INVOLVE SEROSAL SURFACE. - MARGINS ARE NEGATIVE. - FOURTEEN BENIGN LYMPH NODES WITH NO TUMOR SEEN (0/14). - SINGLE SOFT TISSUE SATELLITE TUMOR NODULE / TUMOR DEPOSIT IS PRESENT.   VTE prophylaxis-SCD/lovenox  As detailed above, follow-up appointments have been arranged in our office for staple removal next week, Dr. Hulen Skains in 2 weeks, Dr. Ronnald Ramp in 2 weeks, Dr. Burr Medico at the cancer center in 2 weeks..  @PROBHOSP @  LOS: 7 days    Chrislynn Mosely M 03/19/2015  . .prob

## 2015-03-19 NOTE — Progress Notes (Signed)
Pt ready for discharge.  DC instructions given and reviewed.  rx given and reviewed.  RW in room for pt to take home.  [facility] set up for RN/PT services.  Pt is able to empty his colostomy bag.  No c/o pain.  Awaiting ride home.

## 2015-03-19 NOTE — Progress Notes (Signed)
Subjective: No complaints today. Says he feels well. Okay to go home.   Objective: Vital signs in last 24 hours: Filed Vitals:   03/17/15 2102 03/18/15 0559 03/18/15 2132 03/19/15 0608  BP: 135/84 131/85 140/90 120/84  Pulse: 94 82 89 82  Temp: 98.4 F (36.9 C) 98.7 F (37.1 C) 97.4 F (36.3 C) 98.9 F (37.2 C)  TempSrc: Oral Oral Oral Oral  Resp: 16 16 18 19   Height:      Weight:  180 lb 12.4 oz (82 kg)    SpO2: 100% 97% 98% 99%   Weight change:   Intake/Output Summary (Last 24 hours) at 03/19/15 0745 Last data filed at 03/19/15 0500  Gross per 24 hour  Intake   1200 ml  Output   1700 ml  Net   -500 ml   Physical Exam: General: AA male, alert, cooperative, NAD.  HEENT: PERRL, EOMI. 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  Extremities: No cyanosis, clubbing, or edema. LUE w/ mild swelling and mild tenderness.  Neurologic: Alert & oriented x3, cranial nerves II-XII intact, strength grossly intact, sensation intact to light touch   Lab Results: Basic Metabolic Panel:  Recent Labs Lab 03/16/15 0520 03/17/15 0403 03/18/15 0342  NA 133* 134* 132*  K 3.2* 3.6 4.2  CL 97* 99* 100*  CO2 29 28 24   GLUCOSE 120* 96 97  BUN <5* 6 13  CREATININE 0.80 0.79 0.93  CALCIUM 7.8* 7.8* 7.9*  MG 1.8 1.9  --    Liver Function Tests:  Recent Labs Lab 03/16/15 0520  AST 16  ALT 13*  ALKPHOS 54  BILITOT 0.5  PROT 5.5*  ALBUMIN 2.2*   CBC:  Recent Labs Lab 03/14/15 0327 03/16/15 0520  WBC 10.1 9.7  HGB 12.9* 13.9  HCT 38.3* 42.3  MCV 90.5 91.6  PLT 304 372     CBG:  Recent Labs Lab 03/16/15 0801 03/16/15 1304 03/17/15 0758 03/17/15 1209  03/17/15 1701 03/18/15 0746  GLUCAP 109* 117* 101* 111* 90 95   Medications: I have reviewed the patient's current medications. Scheduled Meds: . antiseptic oral rinse  7 mL Mouth Rinse BID  . enoxaparin (LOVENOX) injection  40 mg Subcutaneous Q24H  . sulfamethoxazole-trimethoprim  1 tablet Oral BID   Continuous Infusions:   PRN Meds:.acetaminophen, ondansetron **OR** [DISCONTINUED] ondansetron (ZOFRAN) IV, traMADol   Assessment/Plan: 74 y/o M w/ PMHx of HTN, HLD, [name] w/ atrophic bladder, admitted for obstructive colonic mass.   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 Tolerating soft diet. Surgical pathology shows invasive adenocarcinoma extending into the serosal layer w/ clear margins. 0/14 LN's positive for disease.  -Tramadol prn for pain -HHPT, Aide, RN -Outpatient oncology follow up; 04/05/15 @ 11:30 AM w/ Dr. Burr Medico  LUE Pain and Swelling: UE doppler shows superficial thrombosis.  -Warm compresses  Hypokalemia: No labs today.  -Recheck BMP in clinic  HTN: BP stable.  -BP meds on hold for now. Will hold HCTZ until seen in the clinic.   Atrophic Bladder: Self-cath per patient. Follows w/ [facility]. Takes bactrim chronically at home. -RN to assist w/ self-cath -Continue Bactrim   DVT/PE PPx: SCD's  Dispo: Anticipated discharge today.   The patient does have a current PCP (  Norman Herrlich, MD) and does need an [facility] hospital follow-up appointment after discharge.  The patient does not have transportation limitations that hinder transportation to clinic appointments.  .Services Needed at time of discharge: Y = Yes, Blank = No PT: HH  OT:   RN: HH (ostomy)  Equipment:   Other:     LOS: 7 days   Corky Sox, MD 03/19/2015, 7:45 AM

## 2015-03-19 NOTE — Discharge Instructions (Signed)
1. You have a follow up appointment as follows:  Surgery, [place]  On 03/22/2015 10:00am, arrive no later than 9:30am for paperwork  [address] [place] [address] [phone]  Judeth Horn  On 04/05/2015 11:40am, arrive by 11:15am  [place], [place] [address] [phone]  Corky Sox  On 04/01/2015 10:45 AM  [place] [address] [phone]  Truitt Merle, MD  On 04/05/2015 11:00 AM  [address] [place] [address] [phone]  2. Please take all medications as previously prescribed with the following changes:  HOLD HCTZ for now until seen in the clinic.  Take Tramadol as needed for pain.  3. If you have worsening of your symptoms or new symptoms arise, please call the clinic ([phone]), or go to the ER immediately if symptoms are severe.     [facility], [place] [phone]  OPEN ABDOMINAL SURGERY: POST OP INSTRUCTIONS  Always review your discharge instruction sheet given to you by the facility where your surgery was performed.  IF YOU HAVE DISABILITY OR FAMILY LEAVE FORMS, YOU MUST BRING THEM TO THE OFFICE FOR PROCESSING.  PLEASE DO NOT GIVE THEM TO YOUR DOCTOR.  1. A prescription for pain medication may be given to you upon discharge.  Take your pain medication as prescribed, if needed.  If narcotic pain medicine is not needed, then you may take acetaminophen (Tylenol) or ibuprofen (Advil) as needed. 2. Take your usually prescribed medications unless otherwise directed. 3. If you need a refill on your pain medication, please contact your pharmacy. They will contact our office to request authorization.  Prescriptions will not be filled after 5pm or on week-ends. 4. You should follow a light diet the first few days after arrival home, such as soup and crackers, pudding, etc.unless your doctor has advised otherwise. A high-fiber, low fat diet can be resumed as tolerated.   Be sure to include  lots of fluids daily. Most patients will experience some swelling and bruising on the chest and neck area.  Ice packs will help.  Swelling and bruising can take several days to resolve 5. Most patients will experience some swelling and bruising in the area of the incision. Ice pack will help. Swelling and bruising can take several days to resolve..  6. It is common to experience some constipation if taking pain medication after surgery.  Increasing fluid intake and taking a stool softener will usually help or prevent this problem from occurring.  A mild laxative (Milk of Magnesia or Miralax) should be taken according to package directions if there are no bowel movements after 48 hours. 7.  You may have steri-strips (small skin tapes) in place directly over the incision.  These strips should be left on the skin for 7-10 days.  If your surgeon used skin glue on the incision, you may shower in 24 hours.  The glue will flake off over the next 2-3 weeks.  Any sutures or staples will be removed at the office during your follow-up visit. You may find that a light gauze bandage over your incision may keep your staples from being rubbed or pulled. You may shower and replace the bandage daily. 8. ACTIVITIES:  You may resume regular (light) daily activities beginning the next day--such as daily self-care, walking, climbing stairs--gradually increasing activities as tolerated.  You may have sexual intercourse when it is comfortable.  Refrain from any heavy lifting  or straining until approved by your doctor. a. You may drive when you no longer are taking prescription pain medication, you can comfortably wear a seatbelt, and you can safely maneuver your car and apply brakes b. Return to Work: ___________________________________ 24. You should see your doctor in the office for a follow-up appointment approximately two weeks after your surgery.  Make sure that you call for this appointment within a day or two after you arrive  home to insure a convenient appointment time. OTHER INSTRUCTIONS:  _____________________________________________________________ _____________________________________________________________  WHEN TO CALL YOUR DOCTOR: 1. Fever over 101.0 2. Inability to urinate 3. Nausea and/or vomiting 4. Extreme swelling or bruising 5. Continued bleeding from incision. 6. Increased pain, redness, or drainage from the incision. 7. Difficulty swallowing or breathing 8. Muscle cramping or spasms. 9. Numbness or tingling in hands or feet or around lips.  The clinic staff is available to answer your questions during regular business hours.  Please dont hesitate to call and ask to speak to one of the nurses if you have concerns.  For further questions, please visit [email or web address]

## 2015-03-19 NOTE — Progress Notes (Signed)
  Date: 03/19/2015  Patient name: Greg [name]  Medical record number: [id number]  Date of birth: 03-16-1941   This patient's plan of care was discussed with the house staff. Please see Dr. Ronnald Ramp' note for complete details. I concur with his findings.  [name] is doing well, tolerating diet, has output from ostomy.  Pain controlled.    Can go home today with good outpatient follow up and services for ostomy care.    Sid Falcon, MD 03/19/2015, 9:32 AM

## 2015-03-19 NOTE — Care Management Note (Addendum)
Case Management Note  Patient Details  Name: Greg [name] MRN: [id number] Date of Birth: 1940-11-05  Subjective/Objective:                  Colostomy  (diagnosis of colon cancer) Action/Plan: D/c planning.   Expected Discharge Date:  03/19/15               Expected Discharge Plan:  [facility]  In-House Referral:     Discharge planning Services  CM Consult  Post Acute Care Choice:  Home Health, Durable Medical Equipment Choice offered to:  Patient  DME Arranged:  [facility] Agency:  [facility] Arranged:  RN, PT [facility] Agency:  [facility]  Status of Service:  Completed, signed off  Medicare Important Message Given:    Date Medicare IM Given:  03/15/15 Medicare IM give by:  Magdalen Spatz RN BSN  Date Additional Medicare IM Given:  03/18/15 Additional Medicare Important Message give by:  Magdalen Spatz RN BSN   If discussed at [place] of Stay Meetings, dates discussed:    Additional Comments: Spoke with patient verified for d/c today and no CM needs at this time.  HH and DME has arranged & confirmed.  AVS updated.   [facility] will deliver RW prior to d/c.   Serena Colonel, RN 03/19/2015, 1:16 PM

## 2015-03-20 NOTE — Discharge Summary (Signed)
Name: Greg [name] MRN: [id number] DOB: 04/22/1941 74 y.o. PCP: Norman Herrlich, MD  Date of Admission: 03/11/2015  2:43 PM Date of Discharge: 03/20/2015 Attending Physician: No att. providers found  Discharge Diagnosis: Principal Problem:   Colon obstruction Active Problems:   Essential hypertension   HEMOCCULT POSITIVE STOOL   Colonic mass   Hypokalemia   Lower abdominal pain   Colon adenocarcinoma  Discharge Medications:   Medication List    STOP taking these medications        cephALEXin 500 MG capsule  Commonly known as:  KEFLEX     fexofenadine 180 MG tablet  Commonly known as:  ALLEGRA     hydrochlorothiazide 25 MG tablet  Commonly known as:  HYDRODIURIL     phenylephrine-shark liver oil-mineral oil-petrolatum 0.25-3-14-71.9 % rectal ointment  Commonly known as:  PREPARATION H     polyethylene glycol powder powder  Commonly known as:  GLYCOLAX/MIRALAX      TAKE these medications        cetirizine 10 MG tablet  Commonly known as:  ZYRTEC  Take 1 tablet (10 mg total) by mouth daily.     pravastatin 20 MG tablet  Commonly known as:  PRAVACHOL  Take 1 tablet (20 mg total) by mouth daily.     sulfamethoxazole-trimethoprim 800-160 MG per tablet  Commonly known as:  BACTRIM DS,SEPTRA DS  Take 1 tablet by mouth 2 (two) times daily.     traMADol 50 MG tablet  Commonly known as:  ULTRAM  Take 1 tablet (50 mg total) by mouth every 6 (six) hours as needed for moderate pain or severe pain.        Disposition and follow-up:   Greg [name] was discharged from [facility] in Good condition.  At the hospital follow up visit please address:  1.  Colostomy; is patient able to change on his own?   Abdominal incision, clean, dry, intact?   Heme/Onc; patient will likely need further treatment, ie; chemo  2.  Labs / imaging needed at time of follow-up: BMP (K)  3.  Pending labs/ test needing follow-up: None  Follow-up  Appointments:     Follow-up Information    Follow up with [facility] On 03/22/2015.   Specialty:  General Surgery   Why:  10:00am, arrive no later than 9:30am for paperwork   Contact information:   [place] [address] [phone]       Follow up with Judeth Horn, MD On 04/05/2015.   Specialty:  General Surgery   Why:  11:40am, arrive by 11:15am   Contact information:   [place] [address] [phone]       Follow up with Luanne Bras, MD On 04/01/2015.   Specialty:  Internal Medicine   Why:  10:45 AM   Contact information:   [place] [address] [phone]       Follow up with Truitt Merle, MD On 04/05/2015.   Specialties:  Hematology, Oncology   Why:  11:00 AM   Contact information:   [place] [address] [phone]       Follow up with [facility].   Why:  Home Health: Registered Nurse and Physical Therapist    Contact information:   [address] [place] [address] [phone]       Follow up with [facility].  Why:  Rolling walker   Contact information:   [address] [place] [address] [phone]       Discharge Instructions:   Consultations: Treatment Team:  Wilford Corner, MD Md Ccs, MD  Procedures Performed:  Ct Abdomen Pelvis W Contrast  03/11/2015   CLINICAL DATA:  Lower abdominal pain for 1 week.  Constipation.  EXAM: CT ABDOMEN AND PELVIS WITH CONTRAST  TECHNIQUE: Multidetector CT imaging of the abdomen and pelvis was performed using the standard protocol following bolus administration of intravenous contrast.  CONTRAST:  174mL OMNIPAQUE IOHEXOL 300 MG/ML  SOLN  COMPARISON:  Plain films today.  CT 02/27/2015  FINDINGS: Lower chest: Linear subsegmental atelectasis in the lung bases. No effusions. Heart is upper limits normal in size.  Hepatobiliary: Scattered small nonspecific  hypodensities throughout the liver, stable. Gallbladder unremarkable. No biliary ductal dilatation.  Pancreas: No focal abnormality or ductal dilatation.  Spleen: No focal abnormality.  Normal size.  Adrenals/Urinary Tract: No focal renal or adrenal abnormality. No hydronephrosis. Urinary bladder is unremarkable.  Stomach/Bowel: The colon is dilated and fluid-filled with air-fluid levels. Stomach and small bowel are decompressed. The colon is dilated to an annular constricting lesion within the sigmoid colon.  Vascular/Lymphatic: No retroperitoneal or mesenteric adenopathy. Aorta normal caliber.  Reproductive: No mass or other significant abnormality.  Other: No free fluid or free air.  Musculoskeletal: Degenerative changes in the lumbar spine. No acute findings.  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 Findings compatible with distal colonic obstructing lesion concerning for malignancy.  Tiny hypodensities scattered throughout the liver, nonspecific, favor small cysts.   Electronically Signed   By: Rolm Baptise M.D.   On: 03/11/2015 21:17   Ct Abdomen Pelvis W Contrast  02/28/2015   CLINICAL DATA:  Abdomen pain. Pain across the lower abdomen for 2-3 days. No nausea or vomiting. No fevers. Patient was here 3 days ago for constipation. Possible blood.  EXAM: CT ABDOMEN AND PELVIS WITH CONTRAST  TECHNIQUE: Multidetector CT imaging of the abdomen and pelvis was performed using the standard protocol following bolus administration of intravenous contrast.  CONTRAST:  19mL OMNIPAQUE IOHEXOL 300 MG/ML  SOLN  COMPARISON:  07/16/2014  FINDINGS: Sub cm low-attenuation lesion in the dome of the liver centrally probably represents a cyst but is too small to characterize. Gallbladder, spleen, pancreas, adrenal glands, kidneys, abdominal aorta, inferior vena cava, and retroperitoneal lymph nodes are unremarkable. Stomach and  small bowel are decompressed. No free air or free fluid in the abdomen. Abdominal wall musculature appears intact.  Pelvis: Again demonstrated in the sigmoid colon, there is an area of circumferential colonic narrowing with irregular margins and infiltration surrounding fat. This likely represents an annular constricting lesion in colon carcinoma should be excluded. There may be progression since previous study. The colon proximally is diffusely stool-filled but without significant distention. No evidence of complete obstruction. Prostate gland is enlarged. Bladder wall is not thickened. No free or loculated pelvic fluid collections. No pelvic lymphadenopathy. Appendix is normal. Degenerative changes in the spine and hips. Schmorl's nodes are present. No destructive bone lesions appreciated.  IMPRESSION: Annular constricting lesion demonstrated in the sigmoid colon. Remainder the colon is diffusely stool-filled but without obvious evidence of obstruction. Indeterminate sub cm lesion in the liver is likely a cyst but too small to characterize. Prostate enlargement.   Electronically Signed   By: Lucienne Capers M.D.   On: 02/28/2015 00:05  Dg Abd Acute W/chest  03/11/2015   CLINICAL DATA:  Constipation for 5 days.  Lower abdominal pain.  EXAM: DG ABDOMEN ACUTE W/ 1V CHEST  COMPARISON:  02/27/2015  FINDINGS: Dilated small bowel loops and colon with air-fluid levels. Findings concerning for distal colonic obstruction. No free air. No organomegaly or suspicious calcification.  Bibasilar atelectasis. Heart is upper limits normal in size. No effusions or acute bony abnormality.  IMPRESSION: Dilated small bowel and proximal and mid colon with air-fluid levels compatible with distal colonic obstruction.   Electronically Signed   By: Rolm Baptise M.D.   On: 03/11/2015 17:51   Dg Abd Acute W/chest  02/27/2015   CLINICAL DATA:  Hypogastric abdominal pain for 3 days with constipation.  EXAM: DG ABDOMEN ACUTE W/ 1V CHEST   COMPARISON:  CT abdomen and pelvis 07/16/2014  FINDINGS: Shallow inspiration. Mild cardiac enlargement without vascular congestion. Infiltration or atelectasis in both lung bases. No blunting of costophrenic angles. No pneumothorax. Calcified and tortuous aorta. Degenerative changes in the shoulders.  Moderate gaseous distention of the colon and some small bowel loops. This could indicate either colonic obstruction or ileus. A few air-fluid levels are demonstrated in the colon. No free intraperitoneal air. Degenerative changes in the spine and hips.  IMPRESSION: Nonspecific moderate gaseous distention of colon and some small bowel. Findings suggest colonic obstruction or ileus. Infiltration or atelectasis noted in both lung bases with mild cardiac enlargement.   Electronically Signed   By: Lucienne Capers M.D.   On: 02/27/2015 21:52    Admission HPI: Greg [name] is a 74 year old man with HTN, atrophic bladder, and chronic sinus bradycardia who presented to [facility] ED on 03/11/2015 with constipation and obstipation. He was seen in the ED 02/27/15 for abdominal pain with x-ray showing possible obstruction but CT scan negative for obstruction or ileus but it did indicate an annular constricting lesion in the sigmoid. He was told to follow-up with GI (Dr Paulita Fujita of [facility]) which he had not yet done at time of presentation. He reported abdominal pain which he describes as going across the bottom of his abdomen but not radiating. The pain was intermittent and worse with eating. He had decreased po intake over this time but did not note any weight loss. He says he had been constipated for a few months on and off but he had a small BM last night. He noted he last passed gas the day prior to presentation. He reported some hematachezia a few days prior to presentation and had also had episodes of melena. He reported one questionable episode of emesis the day prior to admission. He went to get colonoscopy several years ago but  the prep was bad so he never had a colonoscopy. He was unaware of any family history of cancer. In the ED, he had abdominal x-ray with dilated small bowel and proximal and mid colon with air-fluid levels compatible with distal colonic obstruction. He then had CT abd/pelvis with annular constricting lesion within the sigmoid colon with markedly dilated colon proximal to this lesion with air-fluid levels, compatible with distal colonic obstructing lesion concerning for malignancy as well as tiny hypodensities scattered throughout the liver, nonspecific but favor small cysts. General surgery was called and performed sigmoid colectomy with colostomy on 03/12/2015. The patient's post-operative course was complicated only by hypokalemia which was repeatedly replenished with IV and oral KCl. He received IV hydration and pain management and his diet was slowly advanced as tolerated. Physical and occupational  therapy saw the patient and made recommendations. He was discharged to home with home health PT and ostomy care on 03/19/2015. On the day of discharge, the patient was tolerating a full diet, ambulating well with a walker, voiding with self-catheterization as he was prior to hospitalization, and changing his ostomy bag without assistance. He denied nausea, vomiting, abdominal pain, dizziness, or shortness of breath. He will follow up with medical oncology as an outpatient for follow up of new colon adenocarcinoma.  GI and general surgery were consulted during this admission; their recommendations are greatly appreciated.  Hospital Course by problem list: Principal Problem:   Colon obstruction Active Problems:   Essential hypertension   HEMOCCULT POSITIVE STOOL   Colonic mass   Hypokalemia   Lower abdominal pain   Colon adenocarcinoma   1. Colon Adenocarcinoma: Presented with obstruction/obstipation from known annular sigmoid mass. Now s/p sigmoid colectomy/colostomy on 03/12/2015. Pathology report  demonstrates pT4a pN1c colorectal adenocarcinoma, well-differentiated, 0/14 nodes, single soft tissue satellite tumor nodule, negative margins (smallest 3.5 cm). The patient's postoperative course was complicated only by hypokalemia, detailed below. He received morphine 2 mg q2hrs for pain which he used sparingly about once daily, and was sent home with Tramadol for pain control. He received maintenance IVF and his diet was slowly advanced as tolerated. He was tolerating a full diet on the day of discharge. His incision was clean, dry, and intact on day of discharge. He received colostomy teaching and was able to maintain his colostomy on his own by the day of discharge. PT/OT saw the patient and recommended home health PT; he was also discharged with a walker for ambulation.  2. 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 He follows with Dr. Jeffie Pollock of [facility]. He had a Foley catheter for surgery which was quickly transitioned back to self-catheterization with assistance. The patient's urine output was monitored and adequate throughout this hospitalization. The patient is on chronic Bactrim UTI prophylaxis, which was initially held, then restarted at home dose during this admission.  3. Hypokalemia: The patient required IV then oral KCl for potassium supplementation. His electrolytes were checked frequently and maintained at normal levels. The patient's hypokalemia resolved and he did not require any further potassium supplementation for the last two days of his admission.  4. Hypertension: The patient has chronic hypertension and was previously on HCTZ 25 mg daily. He was normotensive during this admission and given his abdominal surgery and hypokalemia, his home HCTZ was held. His need for this medication will be reassessed at hospital follow-up appointment.  5. Arm Swelling: The patient developed new left lower arm swelling on  03/19/2015 with mild discomfort. Venous Doppler U/S demonstrated no DVT and a superficial thrombosis in his L cephalic vein from [facility] to wrist. He used warm compresses and may continue to do so after discharge; on the day of discharge the patient did not complain of arm discomfort or swelling.   Discharge Vitals:   BP 100/87 mmHg  Pulse 83  Temp(Src) 98.6 F (37 C) (Oral)  Resp 17  Ht 5\' 11"  (1.803 m)  Wt 180 lb 12.4 oz (82 kg)  BMI 25.22 kg/m2  SpO2 93%  Discharge Labs:  No results found for this or any previous visit (from the past 24 hour(s)).  Signed: Corky Sox, MD 03/20/2015, 2:05 PM    Services Ordered on Discharge: [facility] PT, RN Equipment Ordered on Discharge: Vassie Moselle

## 2015-03-21 DIAGNOSIS — E785 Hyperlipidemia, unspecified: Secondary | ICD-10-CM | POA: Diagnosis not present

## 2015-03-21 DIAGNOSIS — C187 Malignant neoplasm of sigmoid colon: Secondary | ICD-10-CM | POA: Diagnosis not present

## 2015-03-21 DIAGNOSIS — Z433 Encounter for attention to colostomy: Secondary | ICD-10-CM | POA: Diagnosis not present

## 2015-03-21 DIAGNOSIS — I10 Essential (primary) hypertension: Secondary | ICD-10-CM | POA: Diagnosis not present

## 2015-03-21 DIAGNOSIS — Z483 Aftercare following surgery for neoplasm: Secondary | ICD-10-CM | POA: Diagnosis not present

## 2015-03-21 DIAGNOSIS — Z87891 Personal history of nicotine dependence: Secondary | ICD-10-CM | POA: Diagnosis not present

## 2015-03-21 DIAGNOSIS — R001 Bradycardia, unspecified: Secondary | ICD-10-CM | POA: Diagnosis not present

## 2015-03-22 DIAGNOSIS — E785 Hyperlipidemia, unspecified: Secondary | ICD-10-CM | POA: Diagnosis not present

## 2015-03-22 DIAGNOSIS — R001 Bradycardia, unspecified: Secondary | ICD-10-CM | POA: Diagnosis not present

## 2015-03-22 DIAGNOSIS — C187 Malignant neoplasm of sigmoid colon: Secondary | ICD-10-CM | POA: Diagnosis not present

## 2015-03-22 DIAGNOSIS — Z483 Aftercare following surgery for neoplasm: Secondary | ICD-10-CM | POA: Diagnosis not present

## 2015-03-22 DIAGNOSIS — I10 Essential (primary) hypertension: Secondary | ICD-10-CM | POA: Diagnosis not present

## 2015-03-22 DIAGNOSIS — Z433 Encounter for attention to colostomy: Secondary | ICD-10-CM | POA: Diagnosis not present

## 2015-03-23 ENCOUNTER — Encounter (HOSPITAL_COMMUNITY): Payer: Self-pay

## 2015-03-23 DIAGNOSIS — R001 Bradycardia, unspecified: Secondary | ICD-10-CM | POA: Diagnosis not present

## 2015-03-23 DIAGNOSIS — Z483 Aftercare following surgery for neoplasm: Secondary | ICD-10-CM | POA: Diagnosis not present

## 2015-03-23 DIAGNOSIS — Z433 Encounter for attention to colostomy: Secondary | ICD-10-CM | POA: Diagnosis not present

## 2015-03-23 DIAGNOSIS — E785 Hyperlipidemia, unspecified: Secondary | ICD-10-CM | POA: Diagnosis not present

## 2015-03-23 DIAGNOSIS — C187 Malignant neoplasm of sigmoid colon: Secondary | ICD-10-CM | POA: Diagnosis not present

## 2015-03-23 DIAGNOSIS — I10 Essential (primary) hypertension: Secondary | ICD-10-CM | POA: Diagnosis not present

## 2015-03-24 ENCOUNTER — Telehealth: Payer: Self-pay | Admitting: Oncology

## 2015-03-24 ENCOUNTER — Telehealth: Payer: Self-pay | Admitting: Hematology

## 2015-03-24 NOTE — Telephone Encounter (Signed)
NEW PATIENT APPT-S/W PATIENT AND GAVE NP APPT FOR 06/14 @ 11 W/DR. FENG.

## 2015-03-24 NOTE — Telephone Encounter (Signed)
left message for patient to return call

## 2015-03-25 DIAGNOSIS — E785 Hyperlipidemia, unspecified: Secondary | ICD-10-CM | POA: Diagnosis not present

## 2015-03-25 DIAGNOSIS — I10 Essential (primary) hypertension: Secondary | ICD-10-CM | POA: Diagnosis not present

## 2015-03-25 DIAGNOSIS — R001 Bradycardia, unspecified: Secondary | ICD-10-CM | POA: Diagnosis not present

## 2015-03-25 DIAGNOSIS — Z483 Aftercare following surgery for neoplasm: Secondary | ICD-10-CM | POA: Diagnosis not present

## 2015-03-25 DIAGNOSIS — C187 Malignant neoplasm of sigmoid colon: Secondary | ICD-10-CM | POA: Diagnosis not present

## 2015-03-25 DIAGNOSIS — Z433 Encounter for attention to colostomy: Secondary | ICD-10-CM | POA: Diagnosis not present

## 2015-03-28 DIAGNOSIS — C187 Malignant neoplasm of sigmoid colon: Secondary | ICD-10-CM | POA: Diagnosis not present

## 2015-03-28 DIAGNOSIS — E785 Hyperlipidemia, unspecified: Secondary | ICD-10-CM | POA: Diagnosis not present

## 2015-03-28 DIAGNOSIS — Z433 Encounter for attention to colostomy: Secondary | ICD-10-CM | POA: Diagnosis not present

## 2015-03-28 DIAGNOSIS — Z483 Aftercare following surgery for neoplasm: Secondary | ICD-10-CM | POA: Diagnosis not present

## 2015-03-28 DIAGNOSIS — R001 Bradycardia, unspecified: Secondary | ICD-10-CM | POA: Diagnosis not present

## 2015-03-28 DIAGNOSIS — I10 Essential (primary) hypertension: Secondary | ICD-10-CM | POA: Diagnosis not present

## 2015-03-30 DIAGNOSIS — E785 Hyperlipidemia, unspecified: Secondary | ICD-10-CM | POA: Diagnosis not present

## 2015-03-30 DIAGNOSIS — R001 Bradycardia, unspecified: Secondary | ICD-10-CM | POA: Diagnosis not present

## 2015-03-30 DIAGNOSIS — Z433 Encounter for attention to colostomy: Secondary | ICD-10-CM | POA: Diagnosis not present

## 2015-03-30 DIAGNOSIS — Z483 Aftercare following surgery for neoplasm: Secondary | ICD-10-CM | POA: Diagnosis not present

## 2015-03-30 DIAGNOSIS — C187 Malignant neoplasm of sigmoid colon: Secondary | ICD-10-CM | POA: Diagnosis not present

## 2015-03-30 DIAGNOSIS — I10 Essential (primary) hypertension: Secondary | ICD-10-CM | POA: Diagnosis not present

## 2015-03-31 DIAGNOSIS — R001 Bradycardia, unspecified: Secondary | ICD-10-CM | POA: Diagnosis not present

## 2015-03-31 DIAGNOSIS — Z483 Aftercare following surgery for neoplasm: Secondary | ICD-10-CM | POA: Diagnosis not present

## 2015-03-31 DIAGNOSIS — I10 Essential (primary) hypertension: Secondary | ICD-10-CM | POA: Diagnosis not present

## 2015-03-31 DIAGNOSIS — E785 Hyperlipidemia, unspecified: Secondary | ICD-10-CM | POA: Diagnosis not present

## 2015-03-31 DIAGNOSIS — Z433 Encounter for attention to colostomy: Secondary | ICD-10-CM | POA: Diagnosis not present

## 2015-03-31 DIAGNOSIS — C187 Malignant neoplasm of sigmoid colon: Secondary | ICD-10-CM | POA: Diagnosis not present

## 2015-04-01 ENCOUNTER — Telehealth: Payer: Self-pay | Admitting: *Deleted

## 2015-04-01 ENCOUNTER — Ambulatory Visit (INDEPENDENT_AMBULATORY_CARE_PROVIDER_SITE_OTHER): Payer: Medicare Other | Admitting: Internal Medicine

## 2015-04-01 ENCOUNTER — Encounter: Payer: Self-pay | Admitting: Internal Medicine

## 2015-04-01 VITALS — BP 133/74 | HR 52 | Temp 98.1°F | Ht 71.0 in | Wt 173.9 lb

## 2015-04-01 DIAGNOSIS — I10 Essential (primary) hypertension: Secondary | ICD-10-CM

## 2015-04-01 DIAGNOSIS — C189 Malignant neoplasm of colon, unspecified: Secondary | ICD-10-CM | POA: Diagnosis not present

## 2015-04-01 DIAGNOSIS — Z9049 Acquired absence of other specified parts of digestive tract: Secondary | ICD-10-CM

## 2015-04-01 DIAGNOSIS — Z933 Colostomy status: Secondary | ICD-10-CM

## 2015-04-01 DIAGNOSIS — E876 Hypokalemia: Secondary | ICD-10-CM

## 2015-04-01 LAB — BASIC METABOLIC PANEL WITH GFR
BUN: 12 mg/dL (ref 6–23)
CALCIUM: 9 mg/dL (ref 8.4–10.5)
CO2: 26 mEq/L (ref 19–32)
CREATININE: 1.05 mg/dL (ref 0.50–1.35)
Chloride: 104 mEq/L (ref 96–112)
GFR, Est African American: 80 mL/min
GFR, Est Non African American: 69 mL/min
Glucose, Bld: 78 mg/dL (ref 70–99)
POTASSIUM: 4.3 meq/L (ref 3.5–5.3)
Sodium: 140 mEq/L (ref 135–145)

## 2015-04-01 NOTE — Progress Notes (Signed)
Subjective:   Patient ID: Greg [name] male   DOB: 1941-07-05 74 y.o.   MRN: [id number]  HPI: [name] is a 74 y.o. 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 Patient is doing quite well today, states he is having minimal pain at this time. He was recently seen Dr. Hulen Skains and had his staples removed and has had no issues w/ his wound since that time. He seems to be doing well with his colostomy and changing the bags, says he can do it by himself at this point. He does admit that this has been a difficult transition for him. He also states that his output has not been overwhelming as it was initially in the hospital. He denies nausea, vomiting, or blood in his colostomy. He also had issues w/ hypokalemia during his hospital admission, likely related to vomiting prior to his surgery and high ostomy output after his surgery. Today, he denies any weakness, fatigue, muscle cramps or body aches.   Past Medical History  Diagnosis Date  . Hyperlipidemia   . Hypertension   . Epididymitis     right  . Atony of bladder     followed at [facility]; CIC 3x/day, has been treated for culture proven serratia marcesens 05/2011, 06/2011, 07/2011, 09/2011  . [name] (benign prostatic hyperplasia)     hyposensitive bladder, nodular prostate with increased PSA (10.23 in 03/2006), s/p prostate biopsy 2006 that was normal (Dr Jeffie Pollock) he recommended TURP.  . Sinus bradycardia   . Chronic knee pain     left  . Gingivitis   . Right shoulder pain     sx in 06/2005  . Frozen shoulder     left frozen shoulder  . Shingles    Current Outpatient Prescriptions  Medication Sig Dispense Refill  . cetirizine (ZYRTEC) 10 MG tablet Take 1 tablet (10 mg total) by mouth daily. (Patient not taking: Reported on 02/28/2015) 90 tablet 2  . pravastatin (PRAVACHOL) 20 MG tablet  Take 1 tablet (20 mg total) by mouth daily. (Patient taking differently: Take 20 mg by mouth daily at 6 PM. ) 90 tablet 4  . sulfamethoxazole-trimethoprim (BACTRIM DS,SEPTRA DS) 800-160 MG per tablet Take 1 tablet by mouth 2 (two) times daily.  5  . traMADol (ULTRAM) 50 MG tablet Take 1 tablet (50 mg total) by mouth every 6 (six) hours as needed for moderate pain or severe pain. 30 tablet 0   No current facility-administered medications for this visit.   Review of Systems  General: Denies fever, diaphoresis, appetite change, and fatigue.  Respiratory: Denies SOB, cough, and wheezing.   Cardiovascular: Denies chest pain and palpitations.  Gastrointestinal: Denies nausea, vomiting, abdominal pain, and diarrhea Musculoskeletal: Denies myalgias, arthralgias, back pain, and gait problem.  Neurological: Denies dizziness, syncope, weakness, lightheadedness, and headaches.  Psychiatric/Behavioral: Denies mood changes, sleep disturbance, and agitation.   Objective:   Physical Exam: Filed Vitals:   04/01/15 1051  BP: 133/74  Pulse: 52  Temp: 98.1 F (36.7 C)  TempSrc: Oral  Height: 5\' 11"  (1.803 m)  Weight: 173 lb 14.4 oz (78.881 kg)  SpO2: 100%    General: AA male, alert, cooperative, NAD. HEENT: PERRL, EOMI. Moist mucus membranes Neck: Full range of motion without pain, supple, no lymphadenopathy or carotid bruits Lungs: Clear to ascultation bilaterally, normal work of respiration, no wheezes, rales, rhonchi  Heart: RRR, no murmurs, gallops, or rubs Abdomen: Soft, non-tender, non-distended, BS +. Very well healed central abdominal incision. Left-sided colostomy w/ healthy appearing stoma.  Extremities: No cyanosis, clubbing, or edema Neurologic: Alert & oriented x3, cranial nerves II-XII intact, strength grossly intact, sensation intact to light touch   Assessment & Plan:   Please see problem based assessment and plan.

## 2015-04-01 NOTE — Telephone Encounter (Signed)
Call made to [facility]. Pt here for for HFU and wanted information on how to obtain ostomy supplies.  Pt received one box of pouches and barriers on 06/02, and only has 1-2 left.  Spoke with Barnett Applebaum, who informed me that pt is currently receiving Home Health from [facility] through July 28th.  [facility] will continue to order supplies for patient until that time, but will make arrangements for patient to get supplies before he is released. Home Health Nurse will continue assess ostomy and order supplies based on how many pt is using.  [facility] placed another order for supplies during this call.  Pt informed, phone call complete.Despina Hidden Cassady6/10/201612:06 PM

## 2015-04-01 NOTE — Assessment & Plan Note (Signed)
Hypokalemia during recent admission, most likely related to vomiting in the setting of bowel obstruction, then high output s/p colostomy. No recent vomiting or high ostomy output.  -Check BMP today

## 2015-04-01 NOTE — Assessment & Plan Note (Signed)
BP Readings from Last 3 Encounters:  04/01/15 133/74  03/19/15 100/87  02/28/15 123/80    Lab Results  Component Value Date   NA 132* 03/18/2015   K 4.2 03/18/2015   CREATININE 0.93 03/18/2015    Assessment: Blood pressure control:  Controlled.  Comments: Patient taken off of HCTZ during recent hospital admission in the setting of electrolyte abnormalities and normotension. This was not restarted.   Plan: Medications:  Continue to hold HCTZ for now given normal BP. Can consider starting another medication if increased at next clinic visit.  Educational resources provided: brochure (denies) Self management tools provided:   Other plans: Repeat BMP pending

## 2015-04-01 NOTE — Assessment & Plan Note (Signed)
Now s/p colectomy and colostomy. Doing quite well s/p surgery. Abdominal incision healing quite nicely. Scheduled for follow up w/ Dr. Burr Medico on 04/05/15, will likely need chemotherapy.  -Patient to follow up in [facility] clinic in 4 weeks.

## 2015-04-01 NOTE — Patient Instructions (Signed)
1. Please schedule a follow up appointment for 4 weeks. You have a follow up appointment as follows:  Dr. Burr Medico (oncology): 04/05/15, 11:00 AM  Dr. Hulen Skains (surgery): 04/18/15, 11:50 AM  2. Please take all medications as previously prescribed.   3. If you have worsening of your symptoms or new symptoms arise, please call the clinic ([phone]), or go to the ER immediately if symptoms are severe.  You have done a great job in taking all your medications. Please continue to do this.

## 2015-04-04 DIAGNOSIS — R001 Bradycardia, unspecified: Secondary | ICD-10-CM | POA: Diagnosis not present

## 2015-04-04 DIAGNOSIS — Z433 Encounter for attention to colostomy: Secondary | ICD-10-CM | POA: Diagnosis not present

## 2015-04-04 DIAGNOSIS — Z483 Aftercare following surgery for neoplasm: Secondary | ICD-10-CM | POA: Diagnosis not present

## 2015-04-04 DIAGNOSIS — I10 Essential (primary) hypertension: Secondary | ICD-10-CM | POA: Diagnosis not present

## 2015-04-04 DIAGNOSIS — E785 Hyperlipidemia, unspecified: Secondary | ICD-10-CM | POA: Diagnosis not present

## 2015-04-04 DIAGNOSIS — C187 Malignant neoplasm of sigmoid colon: Secondary | ICD-10-CM | POA: Diagnosis not present

## 2015-04-04 NOTE — Progress Notes (Signed)
Internal Medicine Clinic Attending  Case discussed with Dr. Jones at the time of the visit.  We reviewed the resident's history and exam and pertinent patient test results.  I agree with the assessment, diagnosis, and plan of care documented in the resident's note.  

## 2015-04-05 ENCOUNTER — Encounter: Payer: Self-pay | Admitting: *Deleted

## 2015-04-05 ENCOUNTER — Ambulatory Visit: Payer: Medicare Other

## 2015-04-05 ENCOUNTER — Telehealth: Payer: Self-pay | Admitting: Hematology

## 2015-04-05 ENCOUNTER — Ambulatory Visit (HOSPITAL_BASED_OUTPATIENT_CLINIC_OR_DEPARTMENT_OTHER): Payer: Medicare Other | Admitting: Hematology

## 2015-04-05 ENCOUNTER — Encounter: Payer: Self-pay | Admitting: Hematology

## 2015-04-05 VITALS — BP 129/79 | HR 51 | Temp 98.3°F | Resp 17 | Ht 71.0 in | Wt 173.8 lb

## 2015-04-05 DIAGNOSIS — I10 Essential (primary) hypertension: Secondary | ICD-10-CM | POA: Diagnosis not present

## 2015-04-05 DIAGNOSIS — C187 Malignant neoplasm of sigmoid colon: Secondary | ICD-10-CM

## 2015-04-05 DIAGNOSIS — C189 Malignant neoplasm of colon, unspecified: Secondary | ICD-10-CM

## 2015-04-05 DIAGNOSIS — N312 Flaccid neuropathic bladder, not elsewhere classified: Secondary | ICD-10-CM

## 2015-04-05 DIAGNOSIS — Z809 Family history of malignant neoplasm, unspecified: Secondary | ICD-10-CM | POA: Diagnosis not present

## 2015-04-05 DIAGNOSIS — M1990 Unspecified osteoarthritis, unspecified site: Secondary | ICD-10-CM | POA: Diagnosis not present

## 2015-04-05 MED ORDER — LIDOCAINE-PRILOCAINE 2.5-2.5 % EX CREA: TOPICAL_CREAM | CUTANEOUS | Status: DC

## 2015-04-05 MED ORDER — ONDANSETRON HCL 8 MG PO TABS: 8.0000 mg | ORAL_TABLET | Freq: Two times a day (BID) | ORAL | Status: DC

## 2015-04-05 NOTE — Progress Notes (Signed)
[place]  Telephone:(336) [phone] Fax:([phone]) [place] Note   Patient Care Team: Norman Herrlich, MD as PCP - General Judeth Horn, MD as Consulting Physician (General Surgery) Truitt Merle, MD as Consulting Physician (Hematology) 04/05/2015  CHIEF COMPLAINTS/PURPOSE OF CONSULTATION:  Stage III colon cancer   HISTORY OF PRESENTING ILLNESS:  Greg [name] 74 y.o. male is here because of stage III colon cancer. He was referred after his recent hospitalization.   He has been having abdominal discomfort and nausea for the past few months, along with some blood in his stool. No significant weight loss or change of his appetite. He presented to the ED 02/27/15 for abdominal pain with x-ray showing possible obstruction but CT scan negative for obstruction or ileus but it did indicate an annular constricting lesion in the sigmoid. He was seen by GI Dr Paulita Fujita per colonoscopy failed due to the poor prep.  He presented to ED again on 03/12/2015. CT abd/pelvis showed annular constricting lesion within the sigmoid colon with markedly dilated colon proximal to this lesion with air-fluid levels, compatible with distal colonic obstructing lesion concerning for malignancy as well as tiny hypodensities scattered throughout the liver, nonspecific but favor small cysts. General surgery was called and performed sigmoid colectomy with colostomy on 03/12/2015 by Dr. Hulen Skains. The patient's post-operative course was complicated only by hypokalemia which was repeatedly replenished with IV and oral KCl. He received IV hydration and pain management and his diet was slowly advanced as tolerated. Physical and occupational therapy saw the patient and made recommendations. He was discharged to home with home health PT and ostomy care on 03/19/2015.   He has been recovering well from the surgery. He has no significant pain at the surgical site, no nausea, normal stool output in the colostomy bag. He  has a mild to moderate fatigue, able to take care of himself and tolerating daily activities. He lives alone, uses walker as needed. He feels his energy level has been recovered well. He has a brother who lives for 5 blocks away, and helps him out when he needs.   MEDICAL HISTORY:  Past Medical History  Diagnosis Date  . Hyperlipidemia   . Hypertension   . Epididymitis     right  . Atony of bladder     followed at [facility]; CIC 3x/day, has been treated for culture proven serratia marcesens 05/2011, 06/2011, 07/2011, 09/2011  . [name] (benign prostatic hyperplasia)     hyposensitive bladder, nodular prostate with increased PSA (10.23 in 03/2006), s/p prostate biopsy 2006 that was normal (Dr Jeffie Pollock) he recommended TURP.  . Sinus bradycardia   . Chronic knee pain     left  . Gingivitis   . Right shoulder pain     sx in 06/2005  . Frozen shoulder     left frozen shoulder  . Shingles     SURGICAL HISTORY: Past Surgical History  Procedure Laterality Date  . Vasectomy    . Hydrocelectomy  11/14/2010    left hydrocelectomy  . Right shoulder arthroscopic surgery  06/2005  . Transurethral resection of prostate      status post TURP 2/2 [name] in 2008  . Knee surgery      L knee  . Laparotomy N/A 03/12/2015    Procedure: EXPLORATORY LAPAROTOMY;  Surgeon: Judeth Horn, MD;  Location: Uncertain;  Service: General;  Laterality: N/A;  . Colostomy revision N/A 03/12/2015    Procedure:  RESECTION SIGMOID COLON;  Surgeon:  Judeth Horn, MD;  Location: [place];  Service: General;  Laterality: N/A;  . Colostomy N/A 03/12/2015    Procedure: COLOSTOMY;  Surgeon: Judeth Horn, MD;  Location: [place];  Service: General;  Laterality: N/A;    SOCIAL HISTORY: History   Social History  . Marital Status: Single    Spouse Name: N/A  . Number of Children: N/A  . Years of Education: 12   Occupational History  .      retired   Social History Main Topics  . Smoking status: Former Smoker -- 0.50 packs/day for  25 years    Types: Cigarettes    Quit date: 02/07/1988  . Smokeless tobacco: Not on file  . Alcohol Use: 0.0 oz/week    0 Standard drinks or equivalent per week     Comment: occasional   . Drug Use: No  . Sexual Activity: Not on file   Other Topics Concern  . Not on file   Social History Narrative   Single, lives alone in apartment   No children   Several local siblings in area-will take him to grocery store, etc   Rides bus (75cents/trip) and bus is right outside his house   Prior employment [employer]   Reports strong faith helps him cope with life   Has had to self-cath twice daily since 2008 -Dr. Jeffie Pollock   [facility] for ostomy care and supplies   Ambulates w/walker-independent in ADL's-cooks pot pies and frozen dinners    FAMILY HISTORY: Family History  Problem Relation Age of Onset  . Cancer Father     unknown type cancer     ALLERGIES:  is allergic to ciprofloxacin hcl and ibuprofen.  MEDICATIONS:  Current Outpatient Prescriptions  Medication Sig Dispense Refill  . pravastatin (PRAVACHOL) 20 MG tablet Take 1 tablet (20 mg total) by mouth daily. (Patient taking differently: Take 20 mg by mouth daily at 6 PM. ) 90 tablet 4  . sulfamethoxazole-trimethoprim (BACTRIM DS,SEPTRA DS) 800-160 MG per tablet Take 1 tablet by mouth 2 (two) times daily.  5  . traMADol (ULTRAM) 50 MG tablet Take 1 tablet (50 mg total) by mouth every 6 (six) hours as needed for moderate pain or severe pain. 30 tablet 0   No current facility-administered medications for this visit.    REVIEW OF SYSTEMS:   Constitutional: Denies fevers, chills or abnormal night sweats Eyes: Denies blurriness of vision, double vision or watery eyes Ears, nose, mouth, throat, and face: Denies mucositis or sore throat Respiratory: Denies cough, dyspnea or wheezes Cardiovascular: Denies palpitation, chest discomfort or lower extremity swelling Gastrointestinal:  Denies nausea, heartburn or change in bowel  habits Skin: Denies abnormal skin rashes Lymphatics: Denies new lymphadenopathy or easy bruising Neurological:Denies numbness, tingling or new weaknesses Behavioral/Psych: Mood is stable, no new changes  All other systems were reviewed with the patient and are negative.  PHYSICAL EXAMINATION: ECOG PERFORMANCE STATUS: 1 - Symptomatic but completely ambulatory  Filed Vitals:   04/05/15 1119  BP: 129/79  Pulse: 51  Temp: 98.3 F (36.8 C)  Resp: 17   Filed Weights   04/05/15 1119  Weight: 173 lb 12.8 oz (78.835 kg)    GENERAL:alert, no distress and comfortable SKIN: skin color, texture, turgor are normal, no rashes or significant lesions EYES: normal, conjunctiva are pink and non-injected, sclera clear OROPHARYNX:no exudate, no erythema and lips, buccal mucosa, and tongue normal  NECK: supple, thyroid normal size, non-tender, without nodularity LYMPH:  no palpable lymphadenopathy in  the cervical, axillary or inguinal LUNGS: clear to auscultation and percussion with normal breathing effort HEART: regular rate & rhythm and no murmurs and no lower extremity edema ABDOMEN:abdomen soft, non-tender and normal bowel sounds, surgical incision is well healed, (+) colostomy bag in the left abdomen. Musculoskeletal:no cyanosis of digits and no clubbing  PSYCH: alert & oriented x 3 with fluent speech NEURO: no focal motor/sensory deficits  LABORATORY DATA:  I have reviewed the data as listed Lab Results  Component Value Date   WBC 9.7 03/16/2015   HGB 13.9 03/16/2015   HCT 42.3 03/16/2015   MCV 91.6 03/16/2015   PLT 372 03/16/2015    Recent Labs  02/27/15 2002  03/12/15 0326  03/16/15 0520 03/17/15 0403 03/18/15 0342 04/01/15 1136  NA 132*  < > 134*  < > 133* 134* 132* 140  K 3.2*  < > 3.1*  < > 3.2* 3.6 4.2 4.3  CL 98*  < > 96*  < > 97* 99* 100* 104  CO2 26  < > 26  < > _0 GLUCOSE 132*  < > 146*  < > 120* 96 97 78  BUN 12  < > 15  < > <5* _1 CREATININE  1.19  < > 1.10  < > 0.80 0.79 0.93 1.05  CALCIUM 8.6*  < > 8.5*  < > 7.8* 7.8* 7.9* 9.0  GFRNONAA 58*  < > >60  < > >60 >60 >60 69  GFRAA >60  < > >60  < > >60 >60 >60 80  PROT 7.2  --  6.5  --  5.5*  --   --   --   ALBUMIN 3.4*  --  3.0*  --  2.2*  --   --   --   AST 23  --  14*  --  16  --   --   --   ALT 11*  --  10*  --  13*  --   --   --   ALKPHOS 65  --  54  --  54  --   --   --   BILITOT 0.6  --  1.0  --  0.5  --   --   --   < > = values in this interval not displayed.  CEA  Status: Finalresult Visible to patient:  Not Released Nextappt: Today at 11:00 AM in Oncology ([place])          Ref Range 3wk ago    CEA 0.0 - 4.7 ng/mL 2.5         PATHOLOGY REPORT  Diagnosis 03/12/2015 Colon, segmental resection for tumor, sigmoid colon - INVASIVE WELL-DIFFERENTIATED ADENOCARCINOMA WITH ASSOCIATED ABUNDANT MUCIN, SPANNING 7 CM IN GREATEST DIMENSION. 1 of 4 - TUMOR INVADES THROUGH MUSCULARIS PROPRIA THROUGH PERICOLORECTAL SOFT TISSUES TO INVOLVE SEROSAL SURFACE. - MARGINS ARE NEGATIVE. - FOURTEEN BENIGN LYMPH NODES WITH NO TUMOR SEEN (0/14). - SINGLE SOFT TISSUE SATELLITE  Microscopic Comment COLON AND RECTUM: Specimen: Sigmoid colon. Procedure: Resection of sigmoid colon. Tumor site: Sigmoid colon, distal aspect of specimen. Specimen integrity: Intact.  Macroscopic tumor perforation: Not identified. Invasive tumor: Maximum size: 7 cm. Histologic type(s): Invasive adenocarcinoma with abundant extracellular mucin. Histologic grade and differentiation: G1: well differentiated/low grade Type of polyp in which invasive carcinoma arose: Precursor polyp is not identified. Microscopic extension of invasive tumor: Tumor invades through muscularis propria through pericolorectal soft tissues to involve serosal surface. Lymph-Vascular  invasion: Definitive lymph/vascular invasion is not identified; however, a tumor deposit is present,  see below. Peri-neural invasion: Not identified. Tumor deposit(s) (discontinuous extramural extension): Yes, tumor deposit present. Resection margins: Proximal margin: Negative. Distal margin: Negative. Mesenteric margin (sigmoid and transverse): Negative. Distance closest margin (if all above margins negative): 3.5 cm (mesenteric margin). Treatment effect (neo-adjuvant therapy): Not applicable. Additional polyp(s): No additional polyps identified. Non-neoplastic findings: No significant non-neoplastic findings. Lymph nodes: number examined 14; number positive: 0. Pathologic Staging: pT4a, pN1c. Ancillary studies: Per colorectal cancer protocol, MMR by IHC and MSI by PCR will be performed on the current tumor. (RH:ecj 03/16/2015)  ADDITIONAL INFORMATION: Mismatch Repair (MMR) Protein Immunohistochemistry (IHC) IHC Expression Result: MLH1: Preserved nuclear expression (greater 50% tumor expression) MSH2: Preserved nuclear expression (greater 50% tumor expression) MSH6: Preserved nuclear expression (greater 50% tumor expression) PMS2: Preserved nuclear expression (greater 50% tumor expression) * Internal control demonstrates intact nuclear expression Interpretation: NORMAL    RADIOGRAPHIC STUDIES: I have personally reviewed the radiological images as listed and agreed with the findings in the report.  Ct Abdomen Pelvis W Contrast 03/11/2015    FINDINGS: Lower chest: Linear subsegmental atelectasis in the lung bases. No effusions. Heart is upper limits normal in size.  Hepatobiliary: Scattered small nonspecific hypodensities throughout the liver, stable. Gallbladder unremarkable. No biliary ductal dilatation.  Pancreas: No focal abnormality or ductal dilatation.  Spleen: No focal abnormality.  Normal size.  Adrenals/Urinary Tract: No focal renal or adrenal abnormality. No hydronephrosis. Urinary bladder is unremarkable.  Stomach/Bowel: The colon is dilated and fluid-filled with air-fluid  levels. Stomach and small bowel are decompressed. The colon is dilated to an annular constricting lesion within the sigmoid colon.  Vascular/Lymphatic: No retroperitoneal or mesenteric adenopathy. Aorta normal caliber.  Reproductive: No mass or other significant abnormality.  Other: No free fluid or free air.  Musculoskeletal: Degenerative changes in the lumbar spine. No acute findings.   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 Findings compatible with distal colonic obstructing lesion concerning for malignancy.  Tiny hypodensities scattered throughout the liver, nonspecific, favor small cysts.   Electronically Signed   By: Rolm Baptise M.D.   On: 03/11/2015 21:17    ASSESSMENT & PLAN: 74 year old African-American male  1. Sigmoid colon cancer, pT4N1cM0, stage IIIB, grade 1, MMR normal -I reviewed his CT abdomen and pelvis findings, and his surgical pathology results in great details with the patient. -I'll obtain a CT chest without contrast to complete staging. -Giving the advanced stage IIIB disease, he doesn't have moderate to high risk of cancer recurrence after surgery. -We discussed the role of adjuvant chemotherapy to reduce the risk of cancer recurrence, the standard is 5-FU or capecitabine along with oxaliplatin. Although he is 67, his general health and performance status is relatively good, I think he would be a candidate for FOLFOX. The benefits and risks of chemotherapy were discussed with patient, he agrees to proceed. Alternatively we can consider 5-FU or capecitabine alone, if he has difficulty tolerating FOLFOX. --Chemotherapy consent: Side effects including but does not not limited to, fatigue, nausea, vomiting, diarrhea, hair loss, neuropathy, fluid retention, renal and kidney dysfunction, neutropenic fever, needed for blood transfusion, bleeding, cardiovascular event especially heart  attack, were discussed with patient in great detail. She agrees to proceed. -I'll give him 2 more weeks to recover from the surgery. -He needs a port placement, we'll arrange chemotherapy class also.  2. Atony of bladder -He  does straight cath twice a day, and on chronic Bactrim to prevent UTI. -He'll continue follow-up with his urologist. -We discussed the increased risk of UTI and other infection with chemotherapy  3. Hypertension, arthritis -He will continue follow-up with his primary care [place] placement by Dr. Hulen Skains -CT chest without contrast -Chemotherapy class -Return to clinic in 2 weeks to start FOLFOX   All questions were answered. The patient knows to call the clinic with any problems, questions or concerns. I spent 55 minutes counseling the patient face to face. The total time spent in the appointment was 60 minutes and more than 50% was on counseling.     Truitt Merle, MD 04/05/2015 4:25 PM

## 2015-04-05 NOTE — Progress Notes (Signed)
Checked in new pt with no financial concerns prior to seeing the dr.  Pt has 2 insurances so financial assistance may not be needed.  ° °

## 2015-04-05 NOTE — [facility] Oncology Navigator Note (Signed)
Oncology Nurse Navigator Documentation  Oncology Nurse Navigator Flowsheets 04/05/2015  Referral date to RadOnc/MedOnc 03/18/2015  Navigator Encounter Type Initial MedOnc  Patient Visit Type Medonc-New Patient  Treatment Phase Treatment Planning  Barriers/Navigation Needs Education;Transportation;Family concerns  Support Groups/Services GI  Time Spent with Patient 60  Met with patient  during new patient visit. Explained the role of the GI Nurse Navigator and provided New Patient Packet with information on: 1.  Colorectal cancer 2. Support groups 3. Advanced Directives 4. Fall Safety Plan Answered questions, reviewed current treatment plan using TEACH back and provided emotional support. Provided copy of current treatment plan. Patient was very overwhelmed by the meeting today and chose to leave clinic before his appointments were made. This RN will call him tomorrow to recap everything and work on his appointments. Escorted him through hospital to the bus stop. His main transportation is the city bus-stop is in front of his apartment complex. He does have a brother who will drive him on occasion and take him to grocery store. Lives alone, never been married and no children. Ambulates well with walker. [facility] still following for ostomy care-is able to change bag/wafer independently and supplies are ordered. Also has to I & O cath himself twice daily for last several years-sees Dr. Jeffie Pollock. Will place referral for CSW and Dietician to see him. Will request Dr. Hulen Skains see him soon to arrange for his PAC. Showed patient pictures of what PAC looks like on computer.  Merceda Elks, RN, BSN GI Oncology [place]

## 2015-04-05 NOTE — Telephone Encounter (Signed)
per pof to sch pt appt-sent MW email to sch pt trmt-will call after reply

## 2015-04-06 ENCOUNTER — Telehealth: Payer: Self-pay | Admitting: Oncology

## 2015-04-06 ENCOUNTER — Telehealth: Payer: Self-pay | Admitting: Hematology

## 2015-04-06 ENCOUNTER — Telehealth: Payer: Self-pay | Admitting: *Deleted

## 2015-04-06 NOTE — Telephone Encounter (Signed)
Oncology Nurse Navigator Documentation  Oncology Nurse Navigator Flowsheets 04/06/2015  Referral date to RadOnc/MedOnc -  Navigator Encounter Type Telephone  Patient Visit Type -  Treatment Phase Treatment planning  Barriers/Navigation Needs Concerns-expresses concern over IV chemo. Says "I don't want that thing in my chest or the IV medicine". Concerned it will lessen his quality of life. Made him aware that his cancer has a strong chance to come back or spread. Asking if anything else could be done? Informed that a chemo pill (Xeloda) is another possibility. He says he would rather take pills at home than IV chemo. Agrees to come to chemo class on 6/21 and see dietician and he will get his brother to drive him.  Support Groups/Services -  Time Spent with Patient 15  Informed him I'll discuss his request and concerns with Dr. Burr Medico. Should be simple to get Xeloda for him since he has Medicare and Medicaid.

## 2015-04-06 NOTE — Telephone Encounter (Signed)
Spoke with patient about his 6/21 appointments per pof and he states that he does not wish to do chemo.  Patient states that he call earlier today to speak with Cristy Friedlander and he also received another call but he did not reach the phone in time.  I have called and left a message and advised Manuela Schwartz that the patient wishes not to do chemo and that there are no two week appointments available to corres. With chemo as the patient rides the bus.

## 2015-04-06 NOTE — Telephone Encounter (Signed)
per pof to sch pt appt-reply from MW-cld pt and adv of appt times & dates-adv to get updated copy of sch on 6/21 appt

## 2015-04-07 ENCOUNTER — Encounter: Payer: Self-pay | Admitting: *Deleted

## 2015-04-07 DIAGNOSIS — C187 Malignant neoplasm of sigmoid colon: Secondary | ICD-10-CM | POA: Diagnosis not present

## 2015-04-07 DIAGNOSIS — I10 Essential (primary) hypertension: Secondary | ICD-10-CM | POA: Diagnosis not present

## 2015-04-07 DIAGNOSIS — E785 Hyperlipidemia, unspecified: Secondary | ICD-10-CM | POA: Diagnosis not present

## 2015-04-07 DIAGNOSIS — Z483 Aftercare following surgery for neoplasm: Secondary | ICD-10-CM | POA: Diagnosis not present

## 2015-04-07 DIAGNOSIS — R001 Bradycardia, unspecified: Secondary | ICD-10-CM | POA: Diagnosis not present

## 2015-04-07 DIAGNOSIS — Z433 Encounter for attention to colostomy: Secondary | ICD-10-CM | POA: Diagnosis not present

## 2015-04-07 NOTE — Progress Notes (Signed)
[place] Work  Clinical Social Work was referred by [place] manager for assessment of psychosocial needs.  Clinical Social Worker attempted to contact patient at home to offer support and assess for needs. CSW got vm and left message for pt that CSW team was available for support and how to contact as needed. CSW team to follow up at future appointments as well.    Loren Racer, Grand Worker [place]  [place] Phone: [phone] Fax: [phone]

## 2015-04-08 ENCOUNTER — Other Ambulatory Visit: Payer: Self-pay | Admitting: Hematology

## 2015-04-08 ENCOUNTER — Telehealth: Payer: Self-pay | Admitting: *Deleted

## 2015-04-08 ENCOUNTER — Telehealth: Payer: Self-pay | Admitting: Hematology

## 2015-04-08 DIAGNOSIS — C189 Malignant neoplasm of colon, unspecified: Secondary | ICD-10-CM

## 2015-04-08 MED ORDER — CAPECITABINE 500 MG PO TABS: 1000.0000 mg/m2 | ORAL_TABLET | Freq: Two times a day (BID) | ORAL | Status: DC

## 2015-04-08 NOTE — Telephone Encounter (Signed)
Left VM for patient to confirm his nutrition/chemo class for 6/21. MD agrees to order the oral chemotherapy for him-Xeloda. Requested he call Monday to confirm. Message to MD to get prescription sent. Will follow up on CT scan appointment on Monday.

## 2015-04-08 NOTE — Telephone Encounter (Signed)
I spoke with patient again regarding his adjuvant chemotherapy. He wishes to proceed with capecitabine alone, no 5-FU or oxaliplatin. Giving his advanced age, I think it's reasonable. I will [facility] a [facility] today, and start her him on 04/26/2015.  Truitt Merle 04/08/2015

## 2015-04-08 NOTE — Telephone Encounter (Signed)
Faxed script for Xeloda to [place] today.

## 2015-04-11 ENCOUNTER — Telehealth: Payer: Self-pay

## 2015-04-11 ENCOUNTER — Telehealth: Payer: Self-pay | Admitting: *Deleted

## 2015-04-11 ENCOUNTER — Telehealth: Payer: Self-pay | Admitting: Hematology

## 2015-04-11 NOTE — Telephone Encounter (Signed)
lvm for pt regarding to added appts...for JULY.Marland Kitchen

## 2015-04-11 NOTE — Telephone Encounter (Signed)
Prior auth request for zofran forwarded to [place]

## 2015-04-11 NOTE — Telephone Encounter (Signed)
PT. WAS GIVEN ALL OF HIS FUTURE APPOINTMENTS AT [place].

## 2015-04-12 ENCOUNTER — Other Ambulatory Visit: Payer: Medicare Other

## 2015-04-12 ENCOUNTER — Encounter: Payer: Self-pay | Admitting: *Deleted

## 2015-04-12 ENCOUNTER — Ambulatory Visit: Payer: Medicare Other | Admitting: Nutrition

## 2015-04-12 ENCOUNTER — Encounter: Payer: Self-pay | Admitting: Hematology

## 2015-04-12 NOTE — Progress Notes (Signed)
I placed prior auth for ondansetron on desk of nurse for dr. Burr Medico

## 2015-04-12 NOTE — [facility] Oncology Navigator Note (Signed)
Scheduled his CT scan and provided this appointment to him at chemotherapy class. Printed his calendar for June/July and provided this. Asked education nurse to let him know his Xeloda is ready for pick up with no co pay. Do not start until 04/28/15.

## 2015-04-12 NOTE — Progress Notes (Signed)
74 year old male diagnosed with stage III colon cancer.  He is a patient of Dr. Burr Medico.  Past medical history includes hyperlipidemia, hypertension, tobacco, shingles, and colostomy.  Medications include Zofran and Xeloda.  Labs were reviewed.  Height: 5 feet 11 inches. Weight: 173.8 pounds June 14. Usual body weight: 187 pounds January 2016. BMI: 24.25.  Patient presents to nutrition consult using his walker. Patient reports he feels well at this time. He primarily eats poultry and fish for protein but is willing to try yogurt. He drinks 80 ounces of water every day. Endorses recent weight loss.  Nutrition diagnosis: Unintended weight loss related to recent diagnosis of colon cancer as evidenced by 7% weight loss in 6 months.  Intervention: Patient educated to consume small frequent meals and snacks with protein and adequate calories to maintain present weight. Reviewed high-calorie high-protein diet. Recommended patient try oral nutrition supplements one to 2 daily between meals. Provided samples. Educated patient on strategies for eating with nausea, vomiting and diarrhea. Fact sheets were provided.  Questions were answered.  Teach back method was used and contact information was given.  Monitoring, evaluation, goals: Patient will tolerate adequate calories and protein to minimize weight loss.  Next visit: Patient will contact me by phone as needed for nutrition impact symptoms.  **Disclaimer: This note was dictated with voice recognition software. Similar sounding words can inadvertently be transcribed and this note may contain transcription errors which may not have been corrected upon publication of note.**

## 2015-04-12 NOTE — Progress Notes (Signed)
I faxed form for ondasetron to [phone]

## 2015-04-13 DIAGNOSIS — R001 Bradycardia, unspecified: Secondary | ICD-10-CM | POA: Diagnosis not present

## 2015-04-13 DIAGNOSIS — C187 Malignant neoplasm of sigmoid colon: Secondary | ICD-10-CM | POA: Diagnosis not present

## 2015-04-13 DIAGNOSIS — E785 Hyperlipidemia, unspecified: Secondary | ICD-10-CM | POA: Diagnosis not present

## 2015-04-13 DIAGNOSIS — I10 Essential (primary) hypertension: Secondary | ICD-10-CM | POA: Diagnosis not present

## 2015-04-13 DIAGNOSIS — Z433 Encounter for attention to colostomy: Secondary | ICD-10-CM | POA: Diagnosis not present

## 2015-04-13 DIAGNOSIS — Z483 Aftercare following surgery for neoplasm: Secondary | ICD-10-CM | POA: Diagnosis not present

## 2015-04-16 DIAGNOSIS — R001 Bradycardia, unspecified: Secondary | ICD-10-CM | POA: Diagnosis not present

## 2015-04-16 DIAGNOSIS — Z483 Aftercare following surgery for neoplasm: Secondary | ICD-10-CM | POA: Diagnosis not present

## 2015-04-16 DIAGNOSIS — I10 Essential (primary) hypertension: Secondary | ICD-10-CM | POA: Diagnosis not present

## 2015-04-16 DIAGNOSIS — Z433 Encounter for attention to colostomy: Secondary | ICD-10-CM | POA: Diagnosis not present

## 2015-04-16 DIAGNOSIS — E785 Hyperlipidemia, unspecified: Secondary | ICD-10-CM | POA: Diagnosis not present

## 2015-04-16 DIAGNOSIS — C187 Malignant neoplasm of sigmoid colon: Secondary | ICD-10-CM | POA: Diagnosis not present

## 2015-04-19 ENCOUNTER — Other Ambulatory Visit: Payer: Medicare Other

## 2015-04-19 ENCOUNTER — Inpatient Hospital Stay: Payer: Medicare Other

## 2015-04-20 ENCOUNTER — Ambulatory Visit (HOSPITAL_COMMUNITY): Payer: Medicare Other

## 2015-04-20 DIAGNOSIS — R972 Elevated prostate specific antigen [PSA]: Secondary | ICD-10-CM | POA: Diagnosis not present

## 2015-04-21 ENCOUNTER — Ambulatory Visit (HOSPITAL_COMMUNITY)
Admission: RE | Admit: 2015-04-21 | Discharge: 2015-04-21 | Disposition: A | Discharge status: Home / Self Care | Payer: Medicare Other | Source: Ambulatory Visit | Attending: Hematology | Admitting: Hematology

## 2015-04-21 DIAGNOSIS — E785 Hyperlipidemia, unspecified: Secondary | ICD-10-CM | POA: Diagnosis not present

## 2015-04-21 DIAGNOSIS — Z433 Encounter for attention to colostomy: Secondary | ICD-10-CM | POA: Diagnosis not present

## 2015-04-21 DIAGNOSIS — C187 Malignant neoplasm of sigmoid colon: Secondary | ICD-10-CM | POA: Diagnosis not present

## 2015-04-21 DIAGNOSIS — R001 Bradycardia, unspecified: Secondary | ICD-10-CM | POA: Diagnosis not present

## 2015-04-21 DIAGNOSIS — C189 Malignant neoplasm of colon, unspecified: Secondary | ICD-10-CM | POA: Insufficient documentation

## 2015-04-21 DIAGNOSIS — C7989 Secondary malignant neoplasm of other specified sites: Secondary | ICD-10-CM | POA: Diagnosis not present

## 2015-04-21 DIAGNOSIS — Z483 Aftercare following surgery for neoplasm: Secondary | ICD-10-CM | POA: Diagnosis not present

## 2015-04-21 DIAGNOSIS — I10 Essential (primary) hypertension: Secondary | ICD-10-CM | POA: Diagnosis not present

## 2015-04-26 ENCOUNTER — Telehealth: Payer: Self-pay | Admitting: *Deleted

## 2015-04-26 NOTE — Telephone Encounter (Signed)
Oncology Nurse Navigator Documentation  Oncology Nurse Navigator Flowsheets 04/26/2015  Referral date to RadOnc/MedOnc -  Navigator Encounter Type Telephone  Patient Visit Type -  Treatment Phase First Chemo Tx-Xeloda start  Barriers/Navigation Needs No barriers at this time  Education Symptom Management-diarrhea management, mouth sores, sun exposure  Education Method Teach-back  Support Groups/Services -  Time Spent with Patient 10  Started his Xeloda this morning. Instructed him to get some Imodium to have at home if needed. Take at first sign of diarrhea. Stop Xeloda and call office if he develops diarrhea. He confirmed he has nausea med.

## 2015-04-27 DIAGNOSIS — R300 Dysuria: Secondary | ICD-10-CM | POA: Diagnosis not present

## 2015-04-27 DIAGNOSIS — N312 Flaccid neuropathic bladder, not elsewhere classified: Secondary | ICD-10-CM | POA: Diagnosis not present

## 2015-04-27 DIAGNOSIS — N138 Other obstructive and reflux uropathy: Secondary | ICD-10-CM | POA: Diagnosis not present

## 2015-04-27 DIAGNOSIS — N3020 Other chronic cystitis without hematuria: Secondary | ICD-10-CM | POA: Diagnosis not present

## 2015-04-27 DIAGNOSIS — N401 Enlarged prostate with lower urinary tract symptoms: Secondary | ICD-10-CM | POA: Diagnosis not present

## 2015-04-28 ENCOUNTER — Other Ambulatory Visit: Payer: Self-pay | Admitting: Internal Medicine

## 2015-04-29 DIAGNOSIS — Z433 Encounter for attention to colostomy: Secondary | ICD-10-CM | POA: Diagnosis not present

## 2015-04-29 DIAGNOSIS — Z483 Aftercare following surgery for neoplasm: Secondary | ICD-10-CM | POA: Diagnosis not present

## 2015-04-29 DIAGNOSIS — I10 Essential (primary) hypertension: Secondary | ICD-10-CM | POA: Diagnosis not present

## 2015-04-29 DIAGNOSIS — C187 Malignant neoplasm of sigmoid colon: Secondary | ICD-10-CM | POA: Diagnosis not present

## 2015-04-29 DIAGNOSIS — E785 Hyperlipidemia, unspecified: Secondary | ICD-10-CM | POA: Diagnosis not present

## 2015-04-29 DIAGNOSIS — R001 Bradycardia, unspecified: Secondary | ICD-10-CM | POA: Diagnosis not present

## 2015-05-03 ENCOUNTER — Other Ambulatory Visit: Payer: Self-pay | Admitting: Internal Medicine

## 2015-05-03 ENCOUNTER — Telehealth: Payer: Self-pay | Admitting: *Deleted

## 2015-05-03 ENCOUNTER — Other Ambulatory Visit: Payer: Medicare Other

## 2015-05-03 ENCOUNTER — Inpatient Hospital Stay: Payer: Medicare Other

## 2015-05-03 NOTE — Telephone Encounter (Signed)
  Oncology Nurse Navigator Documentation    Navigator Encounter Type: Telephone (05/03/15 1726)     Barriers/Navigation Needs: No barriers at this time (05/03/15 1726)  Call to F/U on his tolerance of Xeloda he started on 75/5/16. Reports doing well.  No N/V or diarrhea. Confirmed his follow up appointment on 05/06/15.

## 2015-05-06 ENCOUNTER — Encounter: Payer: Self-pay | Admitting: Hematology

## 2015-05-06 ENCOUNTER — Ambulatory Visit (HOSPITAL_BASED_OUTPATIENT_CLINIC_OR_DEPARTMENT_OTHER): Payer: Medicare Other | Admitting: Hematology

## 2015-05-06 ENCOUNTER — Telehealth: Payer: Self-pay | Admitting: Hematology

## 2015-05-06 ENCOUNTER — Other Ambulatory Visit (HOSPITAL_BASED_OUTPATIENT_CLINIC_OR_DEPARTMENT_OTHER): Payer: Medicare Other

## 2015-05-06 VITALS — BP 147/95 | HR 48 | Temp 97.9°F | Resp 16 | Ht 71.0 in | Wt 180.7 lb

## 2015-05-06 DIAGNOSIS — M1990 Unspecified osteoarthritis, unspecified site: Secondary | ICD-10-CM | POA: Diagnosis not present

## 2015-05-06 DIAGNOSIS — C187 Malignant neoplasm of sigmoid colon: Secondary | ICD-10-CM | POA: Diagnosis not present

## 2015-05-06 DIAGNOSIS — N312 Flaccid neuropathic bladder, not elsewhere classified: Secondary | ICD-10-CM

## 2015-05-06 DIAGNOSIS — C189 Malignant neoplasm of colon, unspecified: Secondary | ICD-10-CM

## 2015-05-06 DIAGNOSIS — I10 Essential (primary) hypertension: Secondary | ICD-10-CM

## 2015-05-06 LAB — CBC WITH DIFFERENTIAL/PLATELET
BASO%: 0.8 % (ref 0.0–2.0)
Basophils Absolute: 0 10*3/uL (ref 0.0–0.1)
EOS%: 1.1 % (ref 0.0–7.0)
Eosinophils Absolute: 0.1 10*3/uL (ref 0.0–0.5)
HCT: 38.4 % (ref 38.4–49.9)
HGB: 12.5 g/dL — ABNORMAL LOW (ref 13.0–17.1)
LYMPH%: 25.2 % (ref 14.0–49.0)
MCH: 31.1 pg (ref 27.2–33.4)
MCHC: 32.6 g/dL (ref 32.0–36.0)
MCV: 95.3 fL (ref 79.3–98.0)
MONO#: 0.4 10*3/uL (ref 0.1–0.9)
MONO%: 8.8 % (ref 0.0–14.0)
NEUT#: 3.1 10*3/uL (ref 1.5–6.5)
NEUT%: 64.1 % (ref 39.0–75.0)
Platelets: 304 10*3/uL (ref 140–400)
RBC: 4.03 10*6/uL — ABNORMAL LOW (ref 4.20–5.82)
RDW: 15.2 % — ABNORMAL HIGH (ref 11.0–14.6)
WBC: 4.9 10*3/uL (ref 4.0–10.3)
lymph#: 1.2 10*3/uL (ref 0.9–3.3)

## 2015-05-06 LAB — COMPREHENSIVE METABOLIC PANEL (CC13)
ALK PHOS: 58 U/L (ref 40–150)
ALT: 7 U/L (ref 0–55)
AST: 12 U/L (ref 5–34)
Albumin: 3.4 g/dL — ABNORMAL LOW (ref 3.5–5.0)
Anion Gap: 5 mEq/L (ref 3–11)
BILIRUBIN TOTAL: 0.52 mg/dL (ref 0.20–1.20)
BUN: 15.6 mg/dL (ref 7.0–26.0)
CALCIUM: 8.6 mg/dL (ref 8.4–10.4)
CO2: 29 meq/L (ref 22–29)
CREATININE: 0.9 mg/dL (ref 0.7–1.3)
Chloride: 106 mEq/L (ref 98–109)
EGFR: >90 mL/min/{1.73_m2} (ref >90)
Glucose: 90 mg/dl (ref 70–140)
Potassium: 3.9 mEq/L (ref 3.5–5.1)
Sodium: 140 mEq/L (ref 136–145)
Total Protein: 6.4 g/dL (ref 6.4–8.3)

## 2015-05-06 NOTE — Telephone Encounter (Signed)
Pt confirmed labs/ov per 07/15 POF, gave pt AVS and Calendar..... KJ °

## 2015-05-06 NOTE — Progress Notes (Signed)
[place]  Telephone:([phone]) [phone] Fax:([phone]) [place] Note   Patient Care Team: Norman Herrlich, MD as PCP - General Judeth Horn, MD as Consulting Physician (General Surgery) Truitt Merle, MD as Consulting Physician (Hematology) 05/06/2015  CHIEF COMPLAINTS:  Stage III colon cancer   Oncology History   Colon adenocarcinoma   Staging form: Colon and Rectum, AJCC 7th Edition     Pathologic stage from 03/12/2015: Stage IIIB (T4a, N1c, cM0) - Signed by Truitt Merle, MD on 04/05/2015        Colon adenocarcinoma   02/27/2015 Imaging CT abdomen and pelvis showed an annular constricting lesion within the sigmoid colon, no significant adenopathy or distant metastasis. CT abdomen and pelvis on 03/11/2015 showed similar findings with obstruction.   03/12/2015 Initial Diagnosis Sigmoid colon adenocarcinoma   03/12/2015 Tumor Marker CEA normal 2.5   03/12/2015 Surgery Sigmoid colon segmental resection by Dr. Excell Seltzer, surgical margins were negative.   03/12/2015 Pathology Results Well differentiated invasive adenocarcinoma with associated abundant mucin, 14 lymph nodes were negative, a tumor deposit was present, surgical margins were negative. pT4N1c   04/26/2015 -  Chemotherapy Capecitabine po 2024m q12h, 2 weeks on, one week off, as adjuvant chemo     HISTORY OF PRESENTING ILLNESS:  Greg [name] 74y.o. male is here because of stage III colon cancer. He was referred after his recent hospitalization.   He has been having abdominal discomfort and nausea for the past few months, along with some blood in his stool. No significant weight loss or change of his appetite. He presented to the ED 02/27/15 for abdominal pain with x-ray showing possible obstruction but CT scan negative for obstruction or ileus but it did indicate an annular constricting lesion in the sigmoid. He was seen by GI Dr OPaulita Fujitaper colonoscopy failed due to the poor prep.  He presented to ED again on  03/12/2015. CT abd/pelvis showed annular constricting lesion within the sigmoid colon with markedly dilated colon proximal to this lesion with air-fluid levels, compatible with distal colonic obstructing lesion concerning for malignancy as well as tiny hypodensities scattered throughout the liver, nonspecific but favor small cysts. General surgery was called and performed sigmoid colectomy with colostomy on 03/12/2015 by Dr. WHulen Skains The patient's post-operative course was complicated only by hypokalemia which was repeatedly replenished with IV and oral KCl. He received IV hydration and pain management and his diet was slowly advanced as tolerated. Physical and occupational therapy saw the patient and made recommendations. He was discharged to home with home health PT and ostomy care on 03/19/2015.   He has been recovering well from the surgery. He has no significant pain at the surgical site, no nausea, normal stool output in the colostomy bag. He has a mild to moderate fatigue, able to take care of himself and tolerating daily activities. He lives alone, uses walker as needed. He feels his energy level has been recovered well. He has a brother who lives for 5 blocks away, and helps him out when he needs.  CURRENT THERAPY: adjuvant Xeloda 2000 mg every 12 hours, 2 weeks on, one-week off, started on 04/26/2015  INTERIM HISTORY: He returns for follow up. He started Xeloda on 7/5, and been tolerating it well. He has mild nausea, no vomiting, he has loose BM 1-3 times a day, no abdominal pain or cramps. He has good appetite and eats well, he drinks Ensure once a day, he has gained 10 pounds in the past months. He  feels well overall.   MEDICAL HISTORY:  Past Medical History  Diagnosis Date  . Hyperlipidemia   . Hypertension   . Epididymitis     right  . Atony of bladder     followed at [facility]; CIC 3x/day, has been treated for culture proven serratia marcesens 05/2011, 06/2011, 07/2011, 09/2011  .  [name] (benign prostatic hyperplasia)     hyposensitive bladder, nodular prostate with increased PSA (10.23 in 03/2006), s/p prostate biopsy 2006 that was normal (Dr Jeffie Pollock) he recommended TURP.  . Sinus bradycardia   . Chronic knee pain     left  . Gingivitis   . Right shoulder pain     sx in 06/2005  . Frozen shoulder     left frozen shoulder  . Shingles     SURGICAL HISTORY: Past Surgical History  Procedure Laterality Date  . Vasectomy    . Hydrocelectomy  11/14/2010    left hydrocelectomy  . Right shoulder arthroscopic surgery  06/2005  . Transurethral resection of prostate      status post TURP 2/2 [name] in 2008  . Knee surgery      L knee  . Laparotomy N/A 03/12/2015    Procedure: EXPLORATORY LAPAROTOMY;  Surgeon: Judeth Horn, MD;  Location: [place];  Service: General;  Laterality: N/A;  . Colostomy revision N/A 03/12/2015    Procedure:  RESECTION SIGMOID COLON;  Surgeon: Judeth Horn, MD;  Location: [place];  Service: General;  Laterality: N/A;  . Colostomy N/A 03/12/2015    Procedure: COLOSTOMY;  Surgeon: Judeth Horn, MD;  Location: [place];  Service: General;  Laterality: N/A;    SOCIAL HISTORY: History   Social History  . Marital Status: Single    Spouse Name: N/A  . Number of Children: N/A  . Years of Education: 12   Occupational History  .      retired   Social History Main Topics  . Smoking status: Former Smoker -- 0.50 packs/day for 25 years    Types: Cigarettes    Quit date: 02/07/1988  . Smokeless tobacco: Not on file  . Alcohol Use: 0.0 oz/week    0 Standard drinks or equivalent per week     Comment: occasional   . Drug Use: No  . Sexual Activity: Not on file   Other Topics Concern  . Not on file   Social History Narrative   Single, lives alone in apartment   No children   Several local siblings in area-will take him to grocery store, etc   Rides bus (75cents/trip) and bus is right outside his house   Prior employment [employer]   Reports strong  faith helps him cope with life   Has had to self-cath twice daily since 2008 -Dr. Jeffie Pollock   [facility] for ostomy care and supplies   Ambulates w/walker-independent in ADL's-cooks pot pies and frozen dinners    FAMILY HISTORY: Family History  Problem Relation Age of Onset  . Cancer Father     unknown type cancer     ALLERGIES:  is allergic to ciprofloxacin hcl and ibuprofen.  MEDICATIONS:  Current Outpatient Prescriptions  Medication Sig Dispense Refill  . capecitabine (XELODA) 500 MG tablet Take 4 tablets (2,000 mg total) by mouth 2 (two) times daily after a meal. 112 tablet 2  . nitrofurantoin, macrocrystal-monohydrate, (MACROBID) 100 MG capsule TK ONE C PO  BID  0  . pravastatin (PRAVACHOL) 20 MG tablet Take 1 tablet (20 mg total) by  mouth daily. (Patient taking differently: Take 20 mg by mouth daily at 6 PM. ) 90 tablet 4  . traMADol (ULTRAM) 50 MG tablet Take 1 tablet (50 mg total) by mouth every 6 (six) hours as needed for moderate pain or severe pain. 30 tablet 0   No current facility-administered medications for this visit.    REVIEW OF SYSTEMS:   Constitutional: Denies fevers, chills or abnormal night sweats Eyes: Denies blurriness of vision, double vision or watery eyes Ears, nose, mouth, throat, and face: Denies mucositis or sore throat Respiratory: Denies cough, dyspnea or wheezes Cardiovascular: Denies palpitation, chest discomfort or lower extremity swelling Gastrointestinal:  Denies nausea, heartburn or change in bowel habits Skin: Denies abnormal skin rashes Lymphatics: Denies new lymphadenopathy or easy bruising Neurological:Denies numbness, tingling or new weaknesses Behavioral/Psych: Mood is stable, no new changes  All other systems were reviewed with the patient and are negative.  PHYSICAL EXAMINATION: ECOG PERFORMANCE STATUS: 1 - Symptomatic but completely ambulatory  Filed Vitals:   05/06/15 0955  BP: 147/95  Pulse: 48  Temp: 97.9 F (36.6 C)   Resp: 16   Filed Weights   05/06/15 0955  Weight: 180 lb 11.2 oz (81.965 kg)    GENERAL:alert, no distress and comfortable SKIN: skin color, texture, turgor are normal, no rashes or significant lesions EYES: normal, conjunctiva are pink and non-injected, sclera clear OROPHARYNX:no exudate, no erythema and lips, buccal mucosa, and tongue normal  NECK: supple, thyroid normal size, non-tender, without nodularity LYMPH:  no palpable lymphadenopathy in the cervical, axillary or inguinal LUNGS: clear to auscultation and percussion with normal breathing effort HEART: regular rate & rhythm and no murmurs and no lower extremity edema ABDOMEN:abdomen soft, non-tender and normal bowel sounds, surgical incision is well healed, (+) colostomy bag in the left abdomen. Musculoskeletal:no cyanosis of digits and no clubbing  PSYCH: alert & oriented x 3 with fluent speech NEURO: no focal motor/sensory deficits  LABORATORY DATA:  I have reviewed the data as listed  CBC Latest Ref Rng 05/06/2015 03/16/2015 03/14/2015  WBC 4.0 - 10.3 10e3/uL 4.9 9.7 10.1  Hemoglobin 13.0 - 17.1 g/dL 12.5(L) 13.9 12.9(L)  Hematocrit 38.4 - 49.9 % 38.4 42.3 38.3(L)  Platelets 140 - 400 10e3/uL 304 372 304    CMP Latest Ref Rng 05/06/2015 04/01/2015 03/18/2015  Glucose 70 - 140 mg/dl 90 78 97  BUN 7.0 - 26.0 mg/dL 15._0 Creatinine 0.7 - 1.3 mg/dL 0.9 1.05 0.93  Sodium 136 - 145 mEq/L 140 140 132(L)  Potassium 3.5 - 5.1 mEq/L 3.9 4.3 4.2  Chloride 96 - 112 mEq/L - 104 100(L)  CO2 22 - 29 mEq/L _1 Calcium 8.4 - 10.4 mg/dL 8.6 9.0 7.9(L)  Total Protein 6.4 - 8.3 g/dL 6.4 - -  Total Bilirubin 0.20 - 1.20 mg/dL 0.52 - -  Alkaline Phos 40 - 150 U/L 58 - -  AST 5 - 34 U/L 12 - -  ALT 0 - 55 U/L 7 - -      CEA  Status: Finalresult Visible to patient:  Not Released Nextappt: Today at 11:00 AM in Oncology ([place])          Ref Range 3wk ago    CEA 0.0 - 4.7 ng/mL 2.5          PATHOLOGY REPORT  Diagnosis 03/12/2015 Colon, segmental resection for tumor, sigmoid colon - INVASIVE WELL-DIFFERENTIATED ADENOCARCINOMA WITH ASSOCIATED ABUNDANT MUCIN, SPANNING 7 CM IN GREATEST DIMENSION. 1 of  4 - TUMOR INVADES THROUGH MUSCULARIS PROPRIA THROUGH PERICOLORECTAL SOFT TISSUES TO INVOLVE SEROSAL SURFACE. - MARGINS ARE NEGATIVE. - FOURTEEN BENIGN LYMPH NODES WITH NO TUMOR SEEN (0/14). - SINGLE SOFT TISSUE SATELLITE  Microscopic Comment COLON AND RECTUM: Specimen: Sigmoid colon. Procedure: Resection of sigmoid colon. Tumor site: Sigmoid colon, distal aspect of specimen. Specimen integrity: Intact.  Macroscopic tumor perforation: Not identified. Invasive tumor: Maximum size: 7 cm. Histologic type(s): Invasive adenocarcinoma with abundant extracellular mucin. Histologic grade and differentiation: G1: well differentiated/low grade Type of polyp in which invasive carcinoma arose: Precursor polyp is not identified. Microscopic extension of invasive tumor: Tumor invades through muscularis propria through pericolorectal soft tissues to involve serosal surface. Lymph-Vascular invasion: Definitive lymph/vascular invasion is not identified; however, a tumor deposit is present, see below. Peri-neural invasion: Not identified. Tumor deposit(s) (discontinuous extramural extension): Yes, tumor deposit present. Resection margins: Proximal margin: Negative. Distal margin: Negative. Mesenteric margin (sigmoid and transverse): Negative. Distance closest margin (if all above margins negative): 3.5 cm (mesenteric margin). Treatment effect (neo-adjuvant therapy): Not applicable. Additional polyp(s): No additional polyps identified. Non-neoplastic findings: No significant non-neoplastic findings. Lymph nodes: number examined 14; number positive: 0. Pathologic Staging: pT4a, pN1c. Ancillary studies: Per colorectal cancer protocol, MMR by IHC and MSI by PCR will be performed on the  current tumor. (RH:ecj 03/16/2015)  ADDITIONAL INFORMATION: Mismatch Repair (MMR) Protein Immunohistochemistry (IHC) IHC Expression Result: MLH1: Preserved nuclear expression (greater 50% tumor expression) MSH2: Preserved nuclear expression (greater 50% tumor expression) MSH6: Preserved nuclear expression (greater 50% tumor expression) PMS2: Preserved nuclear expression (greater 50% tumor expression) * Internal control demonstrates intact nuclear expression Interpretation: NORMAL    RADIOGRAPHIC STUDIES: I have personally reviewed the radiological images as listed and agreed with the findings in the report.  Ct Abdomen Pelvis W Contrast 03/11/2015    FINDINGS: Lower chest: Linear subsegmental atelectasis in the lung bases. No effusions. Heart is upper limits normal in size.  Hepatobiliary: Scattered small nonspecific hypodensities throughout the liver, stable. Gallbladder unremarkable. No biliary ductal dilatation.  Pancreas: No focal abnormality or ductal dilatation.  Spleen: No focal abnormality.  Normal size.  Adrenals/Urinary Tract: No focal renal or adrenal abnormality. No hydronephrosis. Urinary bladder is unremarkable.  Stomach/Bowel: The colon is dilated and fluid-filled with air-fluid levels. Stomach and small bowel are decompressed. The colon is dilated to an annular constricting lesion within the sigmoid colon.  Vascular/Lymphatic: No retroperitoneal or mesenteric adenopathy. Aorta normal caliber.  Reproductive: No mass or other significant abnormality.  Other: No free fluid or free air.  Musculoskeletal: Degenerative changes in the lumbar spine. No acute findings.   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 Findings compatible with distal colonic obstructing lesion concerning for malignancy.  Tiny hypodensities scattered throughout the liver, nonspecific, favor small cysts.   Electronically  Signed   By: Rolm Baptise M.D.   On: 03/11/2015 21:17    ASSESSMENT & PLAN: 74 year old African-American male  1. Sigmoid colon cancer, pT4N1cM0, stage IIIB, grade 1, MMR normal -I reviewed his CT abdomen and pelvis findings, and his surgical pathology results in great details with the patient. -I'll obtain a CT chest without contrast to complete staging. -Giving the advanced stage IIIB disease, he doesn't have moderate to high risk of cancer recurrence after surgery. -We discussed the role of adjuvant chemotherapy to reduce the risk of cancer recurrence, the standard is 5-FU or capecitabine along with oxaliplatin. Although he is 3, his general  health and performance status is relatively good, I think he would be a candidate for FOLFOX. The benefits and risks of chemotherapy were discussed with patient, he agrees to proceed. Alternatively we can consider 5-FU or capecitabine alone, if he has difficulty tolerating FOLFOX. -He lateron changed his mind to single agent Xeloda. He is on cycle 1 now, tolerating well. We'll continue, total of 6 months treatment.  2. Atony of bladder -He does straight cath twice a day, and on chronic Bactrim to prevent UTI. -He'll continue follow-up with his urologist. -We discussed the increased risk of UTI and other infection with chemotherapy  3. Hypertension, arthritis -He will continue follow-up with his primary care physician  Plan -Continue cycle 1 Xeloda, finish on 7/18, then off one week  -Lab and visit again before cycle 2 with no APP, I'll see him before cycle 3 -He knows to call us for nausea pill zofran if needed   All questions were answered. The patient knows to call the clinic with any problems, questions or concerns. I spent 20 minutes counseling the patient face to face. The total time spent in the appointment was 25 minutes and more than 50% was on counseling.     Truitt Merle, MD 05/06/2015 10:32 AM

## 2015-05-11 DIAGNOSIS — I10 Essential (primary) hypertension: Secondary | ICD-10-CM | POA: Diagnosis not present

## 2015-05-11 DIAGNOSIS — C187 Malignant neoplasm of sigmoid colon: Secondary | ICD-10-CM | POA: Diagnosis not present

## 2015-05-11 DIAGNOSIS — Z483 Aftercare following surgery for neoplasm: Secondary | ICD-10-CM | POA: Diagnosis not present

## 2015-05-11 DIAGNOSIS — Z433 Encounter for attention to colostomy: Secondary | ICD-10-CM | POA: Diagnosis not present

## 2015-05-11 DIAGNOSIS — E785 Hyperlipidemia, unspecified: Secondary | ICD-10-CM | POA: Diagnosis not present

## 2015-05-11 DIAGNOSIS — R001 Bradycardia, unspecified: Secondary | ICD-10-CM | POA: Diagnosis not present

## 2015-05-13 ENCOUNTER — Encounter: Payer: Self-pay | Admitting: Physician Assistant

## 2015-05-13 ENCOUNTER — Other Ambulatory Visit (HOSPITAL_BASED_OUTPATIENT_CLINIC_OR_DEPARTMENT_OTHER): Payer: Medicare Other

## 2015-05-13 ENCOUNTER — Ambulatory Visit (HOSPITAL_BASED_OUTPATIENT_CLINIC_OR_DEPARTMENT_OTHER): Payer: Medicare Other | Admitting: Physician Assistant

## 2015-05-13 VITALS — BP 139/84 | HR 55 | Temp 98.0°F | Resp 18 | Ht 71.0 in | Wt 184.1 lb

## 2015-05-13 DIAGNOSIS — C187 Malignant neoplasm of sigmoid colon: Secondary | ICD-10-CM

## 2015-05-13 DIAGNOSIS — I10 Essential (primary) hypertension: Secondary | ICD-10-CM | POA: Diagnosis not present

## 2015-05-13 DIAGNOSIS — C189 Malignant neoplasm of colon, unspecified: Secondary | ICD-10-CM

## 2015-05-13 DIAGNOSIS — M1990 Unspecified osteoarthritis, unspecified site: Secondary | ICD-10-CM | POA: Diagnosis not present

## 2015-05-13 DIAGNOSIS — R110 Nausea: Secondary | ICD-10-CM | POA: Diagnosis not present

## 2015-05-13 DIAGNOSIS — N312 Flaccid neuropathic bladder, not elsewhere classified: Secondary | ICD-10-CM | POA: Diagnosis not present

## 2015-05-13 DIAGNOSIS — R339 Retention of urine, unspecified: Secondary | ICD-10-CM

## 2015-05-13 LAB — CBC WITH DIFFERENTIAL/PLATELET
BASO%: 0.7 % (ref 0.0–2.0)
Basophils Absolute: 0 10*3/uL (ref 0.0–0.1)
EOS ABS: 0.1 10*3/uL (ref 0.0–0.5)
EOS%: 1 % (ref 0.0–7.0)
HCT: 38 % — ABNORMAL LOW (ref 38.4–49.9)
HGB: 12.4 g/dL — ABNORMAL LOW (ref 13.0–17.1)
LYMPH#: 1.5 10*3/uL (ref 0.9–3.3)
LYMPH%: 25.8 % (ref 14.0–49.0)
MCH: 31.2 pg (ref 27.2–33.4)
MCHC: 32.6 g/dL (ref 32.0–36.0)
MCV: 95.7 fL (ref 79.3–98.0)
MONO#: 0.5 10*3/uL (ref 0.1–0.9)
MONO%: 9 % (ref 0.0–14.0)
NEUT#: 3.8 10*3/uL (ref 1.5–6.5)
NEUT%: 63.5 % (ref 39.0–75.0)
Platelets: 287 10*3/uL (ref 140–400)
RBC: 3.97 10*6/uL — AB (ref 4.20–5.82)
RDW: 16 % — AB (ref 11.0–14.6)
WBC: 5.9 10*3/uL (ref 4.0–10.3)

## 2015-05-13 LAB — COMPREHENSIVE METABOLIC PANEL (CC13)
ALT: 6 U/L (ref 0–55)
AST: 14 U/L (ref 5–34)
Albumin: 3.6 g/dL (ref 3.5–5.0)
Alkaline Phosphatase: 60 U/L (ref 40–150)
Anion Gap: 6 mEq/L (ref 3–11)
BUN: 14.9 mg/dL (ref 7.0–26.0)
CHLORIDE: 106 meq/L (ref 98–109)
CO2: 28 meq/L (ref 22–29)
Calcium: 8.9 mg/dL (ref 8.4–10.4)
Creatinine: 0.8 mg/dL (ref 0.7–1.3)
EGFR: >90 mL/min/{1.73_m2} (ref >90)
GLUCOSE: 97 mg/dL (ref 70–140)
Potassium: 4.1 mEq/L (ref 3.5–5.1)
Sodium: 140 mEq/L (ref 136–145)
Total Bilirubin: 0.43 mg/dL (ref 0.20–1.20)
Total Protein: 6.8 g/dL (ref 6.4–8.3)

## 2015-05-13 NOTE — Progress Notes (Signed)
[place]  [phone] [phone]  Clinic  Note   Patient Care Team: Norman Herrlich, MD as PCP - General Judeth Horn, MD as Consulting Physician (General Surgery) Truitt Merle, MD as Consulting Physician (Hematology) 05/13/2015  CHIEF COMPLAINTS:  Stage III colon cancer   Oncology History   Colon adenocarcinoma   Staging form: Colon and Rectum, AJCC 7th Edition     Pathologic stage from 03/12/2015: Stage IIIB (T4a, N1c, cM0) - Signed by Truitt Merle, MD on 04/05/2015        Colon adenocarcinoma   02/27/2015 Imaging CT abdomen and pelvis showed an annular constricting lesion within the sigmoid colon, no significant adenopathy or distant metastasis. CT abdomen and pelvis on 03/11/2015 showed similar findings with obstruction.   03/12/2015 Initial Diagnosis Sigmoid colon adenocarcinoma   03/12/2015 Tumor Marker CEA normal 2.5   03/12/2015 Surgery Sigmoid colon segmental resection by Dr. Excell Seltzer, surgical margins were negative.   03/12/2015 Pathology Results Well differentiated invasive adenocarcinoma with associated abundant mucin, 14 lymph nodes were negative, a tumor deposit was present, surgical margins were negative. pT4N1c   04/26/2015 -  Chemotherapy Capecitabine po 2023m q12h, 2 weeks on, one week off, as adjuvant chemo     HISTORY OF PRESENTING ILLNESS:  Greg [name] 74y.o. male is here because of stage III colon cancer. He was referred after his recent hospitalization.   He has been having abdominal discomfort and nausea for the past few months, along with some blood in his stool. No significant weight loss or change of his appetite. He presented to the ED 02/27/15 for abdominal pain with x-ray showing possible obstruction but CT scan negative for obstruction or ileus but it did indicate an annular constricting lesion in the sigmoid. He was seen by GI Dr OPaulita Fujitaper colonoscopy failed due to the poor prep.  He presented to ED again on 03/12/2015. CT  abd/pelvis showed annular constricting lesion within the sigmoid colon with markedly dilated colon proximal to this lesion with air-fluid levels, compatible with distal colonic obstructing lesion concerning for malignancy as well as tiny hypodensities scattered throughout the liver, nonspecific but favor small cysts. General surgery was called and performed sigmoid colectomy with colostomy on 03/12/2015 by Dr. WHulen Skains The patient's post-operative course was complicated only by hypokalemia which was repeatedly replenished with IV and oral KCl. He received IV hydration and pain management and his diet was slowly advanced as tolerated. Physical and occupational therapy saw the patient and made recommendations. He was discharged to home with home health PT and ostomy care on 03/19/2015.   He has been recovering well from the surgery. He has no significant pain at the surgical site, no nausea, normal stool output in the colostomy bag. He has a mild to moderate fatigue, able to take care of himself and tolerating daily activities. He lives alone, uses walker as needed. He feels his energy level has been recovered well. He has a brother who lives for 5 blocks away, and helps him out when he needs.  CURRENT THERAPY: adjuvant Xeloda 2000 mg every 12 hours, 2 weeks on, one-week off, started on 04/26/2015  INTERIM HISTORY: He returns for follow up. He started Xeloda on 04/26/2015 and completed cycle #1 on 05/09/2015. He tolerated the Xeloda relatively well with the exception of some increased belching. He notes a little irritation around his stoma just a tiny bit of red blood when cleaning around the stoma and changing the colostomy bags. He denies any frank  bleeding or bloody stools. He has mild nausea, no vomiting. He continues to have 1-3 loose bowel movements daily with no abdominal pain or cramping. He has a good appetite and eats well. He drinks Ensure once a day. he has gained an additional 4 pounds recently.He feels  well overall.   MEDICAL HISTORY:  Past Medical History  Diagnosis Date  . Hyperlipidemia   . Hypertension   . Epididymitis     right  . Atony of bladder     followed at [facility]; CIC 3x/day, has been treated for culture proven serratia marcesens 05/2011, 06/2011, 07/2011, 09/2011  . [name] (benign prostatic hyperplasia)     hyposensitive bladder, nodular prostate with increased PSA (10.23 in 03/2006), s/p prostate biopsy 2006 that was normal (Dr Jeffie Pollock) he recommended TURP.  . Sinus bradycardia   . Chronic knee pain     left  . Gingivitis   . Right shoulder pain     sx in 06/2005  . Frozen shoulder     left frozen shoulder  . Shingles     SURGICAL HISTORY: Past Surgical History  Procedure Laterality Date  . Vasectomy    . Hydrocelectomy  11/14/2010    left hydrocelectomy  . Right shoulder arthroscopic surgery  06/2005  . Transurethral resection of prostate      status post TURP 2/2 [name] in 2008  . Knee surgery      L knee  . Laparotomy N/A 03/12/2015    Procedure: EXPLORATORY LAPAROTOMY;  Surgeon: Judeth Horn, MD;  Location: [place];  Service: General;  Laterality: N/A;  . Colostomy revision N/A 03/12/2015    Procedure:  RESECTION SIGMOID COLON;  Surgeon: Judeth Horn, MD;  Location: [place];  Service: General;  Laterality: N/A;  . Colostomy N/A 03/12/2015    Procedure: COLOSTOMY;  Surgeon: Judeth Horn, MD;  Location: [place];  Service: General;  Laterality: N/A;    SOCIAL HISTORY: History   Social History  . Marital Status: Single    Spouse Name: N/A  . Number of Children: N/A  . Years of Education: 12   Occupational History  .      retired   Social History Main Topics  . Smoking status: Former Smoker -- 0.50 packs/day for 25 years    Types: Cigarettes    Quit date: 02/07/1988  . Smokeless tobacco: Not on file  . Alcohol Use: 0.0 oz/week    0 Standard drinks or equivalent per week     Comment: occasional   . Drug Use: No  . Sexual Activity: Not on file    Other Topics Concern  . Not on file   Social History Narrative   Single, lives alone in apartment   No children   Several local siblings in area-will take him to grocery store, etc   Rides bus (75cents/trip) and bus is right outside his house   Prior employment [employer]   Reports strong faith helps him cope with life   Has had to self-cath twice daily since 2008 -Dr. Jeffie Pollock   [facility] for ostomy care and supplies   Ambulates w/walker-independent in ADL's-cooks pot pies and frozen dinners    FAMILY HISTORY: Family History  Problem Relation Age of Onset  . Cancer Father     unknown type cancer     ALLERGIES:  is allergic to ciprofloxacin hcl and ibuprofen.  MEDICATIONS:  Current Outpatient Prescriptions  Medication Sig Dispense Refill  . capecitabine (XELODA) 500 MG tablet Take  4 tablets (2,000 mg total) by mouth 2 (two) times daily after a meal. 112 tablet 2  . nitrofurantoin, macrocrystal-monohydrate, (MACROBID) 100 MG capsule TK ONE C PO  BID  0  . pravastatin (PRAVACHOL) 20 MG tablet Take 1 tablet (20 mg total) by mouth daily. (Patient taking differently: Take 20 mg by mouth daily at 6 PM. ) 90 tablet 4  . traMADol (ULTRAM) 50 MG tablet Take 1 tablet (50 mg total) by mouth every 6 (six) hours as needed for moderate pain or severe pain. 30 tablet 0   No current facility-administered medications for this visit.    REVIEW OF SYSTEMS:   Constitutional: Denies fevers, chills or abnormal night sweats Eyes: Denies blurriness of vision, double vision or watery eyes Ears, nose, mouth, throat, and face: Denies mucositis or sore throat Respiratory: Denies cough, dyspnea or wheezes Cardiovascular: Denies palpitation, chest discomfort or lower extremity swelling Gastrointestinal:  Denies nausea, heartburn or change in bowel habits Skin: Denies abnormal skin rashes Lymphatics: Denies new lymphadenopathy or easy bruising Neurological:Denies numbness, tingling or new  weaknesses Behavioral/Psych: Mood is stable, no new changes  All other systems were reviewed with the patient and are negative.  PHYSICAL EXAMINATION: ECOG PERFORMANCE STATUS: 1 - Symptomatic but completely ambulatory  Filed Vitals:   05/13/15 1450  BP: 139/84  Pulse: 55  Temp: 98 F (36.7 C)  Resp: 18   Filed Weights   05/13/15 1450  Weight: 184 lb 1.6 oz (83.507 kg)    GENERAL:alert, no distress and comfortable SKIN: skin color, texture, turgor are normal, no rashes or significant lesions EYES: normal, conjunctiva are pink and non-injected, sclera clear OROPHARYNX:no exudate, no erythema and lips, buccal mucosa, and tongue normal  NECK: supple, thyroid normal size, non-tender, without nodularity LYMPH:  no palpable lymphadenopathy in the cervical, axillary or inguinal LUNGS: clear to auscultation and percussion with normal breathing effort HEART: regular rate & rhythm and no murmurs and no lower extremity edema ABDOMEN:abdomen soft, non-tender and normal bowel sounds, surgical incision is well healed, (+) colostomy bag in the left abdomen. Stoma red without active bleeding or evidence of infection. Small amount of soft yellow/brown stool in colostomy bag Musculoskeletal:no cyanosis of digits and no clubbing  PSYCH: alert & oriented x 3 with fluent speech NEURO: no focal motor/sensory deficits  LABORATORY DATA:  I have reviewed the data as listed  CBC Latest Ref Rng 05/13/2015 05/06/2015 03/16/2015  WBC 4.0 - 10.3 10e3/uL 5.9 4.9 9.7  Hemoglobin 13.0 - 17.1 g/dL 12.4(L) 12.5(L) 13.9  Hematocrit 38.4 - 49.9 % 38.0(L) 38.4 42.3  Platelets 140 - 400 10e3/uL 287 304 372    CMP Latest Ref Rng 05/13/2015 05/06/2015 04/01/2015  Glucose 70 - 140 mg/dl 97 90 78  BUN 7.0 - 26.0 mg/dL 14.9 15.6 12  Creatinine 0.7 - 1.3 mg/dL 0.8 0.9 1.05  Sodium 136 - 145 mEq/L 140 140 140  Potassium 3.5 - 5.1 mEq/L 4.1 3.9 4.3  Chloride 96 - 112 mEq/L - - 104  CO2 22 - 29 mEq/L 28 29 26   Calcium  8.4 - 10.4 mg/dL 8.9 8.6 9.0  Total Protein 6.4 - 8.3 g/dL 6.8 6.4 -  Total Bilirubin 0.20 - 1.20 mg/dL 0.43 0.52 -  Alkaline Phos 40 - 150 U/L 60 58 -  AST 5 - 34 U/L 14 12 -  ALT 0 - 55 U/L 6 7 -      CEA  Status: Finalresult Visible to patient:  Not Released Nextappt: Today at  11:00 AM in Oncology ([place])          Ref Range 3wk ago    CEA 0.0 - 4.7 ng/mL 2.5         PATHOLOGY REPORT  Diagnosis 03/12/2015 Colon, segmental resection for tumor, sigmoid colon - INVASIVE WELL-DIFFERENTIATED ADENOCARCINOMA WITH ASSOCIATED ABUNDANT MUCIN, SPANNING 7 CM IN GREATEST DIMENSION. 1 of 4 - TUMOR INVADES THROUGH MUSCULARIS PROPRIA THROUGH PERICOLORECTAL SOFT TISSUES TO INVOLVE SEROSAL SURFACE. - MARGINS ARE NEGATIVE. - FOURTEEN BENIGN LYMPH NODES WITH NO TUMOR SEEN (0/14). - SINGLE SOFT TISSUE SATELLITE  Microscopic Comment COLON AND RECTUM: Specimen: Sigmoid colon. Procedure: Resection of sigmoid colon. Tumor site: Sigmoid colon, distal aspect of specimen. Specimen integrity: Intact.  Macroscopic tumor perforation: Not identified. Invasive tumor: Maximum size: 7 cm. Histologic type(s): Invasive adenocarcinoma with abundant extracellular mucin. Histologic grade and differentiation: G1: well differentiated/low grade Type of polyp in which invasive carcinoma arose: Precursor polyp is not identified. Microscopic extension of invasive tumor: Tumor invades through muscularis propria through pericolorectal soft tissues to involve serosal surface. Lymph-Vascular invasion: Definitive lymph/vascular invasion is not identified; however, a tumor deposit is present, see below. Peri-neural invasion: Not identified. Tumor deposit(s) (discontinuous extramural extension): Yes, tumor deposit present. Resection margins: Proximal margin: Negative. Distal margin: Negative. Mesenteric margin (sigmoid and transverse): Negative. Distance closest margin (if all  above margins negative): 3.5 cm (mesenteric margin). Treatment effect (neo-adjuvant therapy): Not applicable. Additional polyp(s): No additional polyps identified. Non-neoplastic findings: No significant non-neoplastic findings. Lymph nodes: number examined 14; number positive: 0. Pathologic Staging: pT4a, pN1c. Ancillary studies: Per colorectal cancer protocol, MMR by IHC and MSI by PCR will be performed on the current tumor. (RH:ecj 03/16/2015)  ADDITIONAL INFORMATION: Mismatch Repair (MMR) Protein Immunohistochemistry (IHC) IHC Expression Result: MLH1: Preserved nuclear expression (greater 50% tumor expression) MSH2: Preserved nuclear expression (greater 50% tumor expression) MSH6: Preserved nuclear expression (greater 50% tumor expression) PMS2: Preserved nuclear expression (greater 50% tumor expression) * Internal control demonstrates intact nuclear expression Interpretation: NORMAL    RADIOGRAPHIC STUDIES: I have personally reviewed the radiological images as listed and agreed with the findings in the report.  Ct Abdomen Pelvis W Contrast 03/11/2015    FINDINGS: Lower chest: Linear subsegmental atelectasis in the lung bases. No effusions. Heart is upper limits normal in size.  Hepatobiliary: Scattered small nonspecific hypodensities throughout the liver, stable. Gallbladder unremarkable. No biliary ductal dilatation.  Pancreas: No focal abnormality or ductal dilatation.  Spleen: No focal abnormality.  Normal size.  Adrenals/Urinary Tract: No focal renal or adrenal abnormality. No hydronephrosis. Urinary bladder is unremarkable.  Stomach/Bowel: The colon is dilated and fluid-filled with air-fluid levels. Stomach and small bowel are decompressed. The colon is dilated to an annular constricting lesion within the sigmoid colon.  Vascular/Lymphatic: No retroperitoneal or mesenteric adenopathy. Aorta normal caliber.  Reproductive: No mass or other significant abnormality.  Other: No free fluid  or free air.  Musculoskeletal: Degenerative changes in the lumbar spine. No acute findings.   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 Findings compatible with distal colonic obstructing lesion concerning for malignancy.  Tiny hypodensities scattered throughout the liver, nonspecific, favor small cysts.   Electronically Signed   By: Rolm Baptise M.D.   On: 03/11/2015 21:17    ASSESSMENT & PLAN: 74 year old African-American male  1. Sigmoid colon cancer, pT4N1cM0, stage IIIB, grade 1, MMR normal -Dr. Burr Medico reviewed his CT abdomen and pelvis findings, and his  surgical pathology results in great details with the patient. -a CT chest without contrast was obtained to complete staging and was negative for evidence of thoracic metastatic disease. -Giving the advanced stage IIIB disease, he doesn't have moderate to high risk of cancer recurrence after surgery. -We discussed the role of adjuvant chemotherapy to reduce the risk of cancer recurrence, the standard is 5-FU or capecitabine along with oxaliplatin. Although he is 64, his general health and performance status is relatively good, I think he would be a candidate for FOLFOX. The benefits and risks of chemotherapy were discussed with patient, he agrees to proceed. Alternatively we can consider 5-FU or capecitabine alone, if he has difficulty tolerating FOLFOX. -He later on changed his mind to single agent Xeloda. He is status post  cycle 1 now, and tolerated it well. We'll continue, total of 6 months treatment.  2. Atony of bladder -He does straight cath twice a day, and on chronic Bactrim to prevent UTI. -He'll continue follow-up with his urologist. -We discussed the increased risk of UTI and other infection with chemotherapy  3. Hypertension, arthritis -He will continue follow-up with his primary care physician  Plan -Status post cycle 1 Xeloda,  finished on 05/09/2015, then off one week. He will start cycle #2 on 05/17/2015. -Lab and visit again before cycle 3 with Dr. Burr Medico as previously scheduled. -He knows to call us for nausea pill zofran if needed   All questions were answered. The patient knows to call the clinic with any problems, questions or concerns. I spent 20 minutes counseling the patient face to face. The total time spent in the appointment was 25 minutes and more than 50% was on counseling.     Carlton Adam, PA-C 05/13/2015 4:16 PM

## 2015-05-13 NOTE — Patient Instructions (Signed)
Start your next cycle of Xeloda on 05/17/2015 Kepp your appointment with Dr. Burr Medico as scheduled on 05/27/2015

## 2015-05-19 DIAGNOSIS — E785 Hyperlipidemia, unspecified: Secondary | ICD-10-CM | POA: Diagnosis not present

## 2015-05-19 DIAGNOSIS — I10 Essential (primary) hypertension: Secondary | ICD-10-CM | POA: Diagnosis not present

## 2015-05-19 DIAGNOSIS — C187 Malignant neoplasm of sigmoid colon: Secondary | ICD-10-CM | POA: Diagnosis not present

## 2015-05-19 DIAGNOSIS — R001 Bradycardia, unspecified: Secondary | ICD-10-CM | POA: Diagnosis not present

## 2015-05-19 DIAGNOSIS — Z433 Encounter for attention to colostomy: Secondary | ICD-10-CM | POA: Diagnosis not present

## 2015-05-19 DIAGNOSIS — Z483 Aftercare following surgery for neoplasm: Secondary | ICD-10-CM | POA: Diagnosis not present

## 2015-05-27 ENCOUNTER — Other Ambulatory Visit (HOSPITAL_BASED_OUTPATIENT_CLINIC_OR_DEPARTMENT_OTHER): Payer: Medicare Other

## 2015-05-27 ENCOUNTER — Encounter: Payer: Self-pay | Admitting: Hematology

## 2015-05-27 ENCOUNTER — Telehealth: Payer: Self-pay | Admitting: Hematology

## 2015-05-27 ENCOUNTER — Ambulatory Visit (HOSPITAL_BASED_OUTPATIENT_CLINIC_OR_DEPARTMENT_OTHER): Payer: Medicare Other | Admitting: Hematology

## 2015-05-27 VITALS — BP 147/90 | HR 59 | Temp 98.2°F | Resp 18 | Ht 71.0 in | Wt 183.3 lb

## 2015-05-27 DIAGNOSIS — I10 Essential (primary) hypertension: Secondary | ICD-10-CM | POA: Diagnosis not present

## 2015-05-27 DIAGNOSIS — C187 Malignant neoplasm of sigmoid colon: Secondary | ICD-10-CM | POA: Diagnosis not present

## 2015-05-27 DIAGNOSIS — C189 Malignant neoplasm of colon, unspecified: Secondary | ICD-10-CM

## 2015-05-27 DIAGNOSIS — N312 Flaccid neuropathic bladder, not elsewhere classified: Secondary | ICD-10-CM

## 2015-05-27 DIAGNOSIS — M1990 Unspecified osteoarthritis, unspecified site: Secondary | ICD-10-CM

## 2015-05-27 LAB — CBC WITH DIFFERENTIAL/PLATELET
BASO%: 1.5 % (ref 0.0–2.0)
Basophils Absolute: 0.1 10*3/uL (ref 0.0–0.1)
EOS%: 1.1 % (ref 0.0–7.0)
Eosinophils Absolute: 0.1 10*3/uL (ref 0.0–0.5)
HCT: 39.9 % (ref 38.4–49.9)
HGB: 13 g/dL (ref 13.0–17.1)
LYMPH#: 1.3 10*3/uL (ref 0.9–3.3)
LYMPH%: 20.8 % (ref 14.0–49.0)
MCH: 31.9 pg (ref 27.2–33.4)
MCHC: 32.6 g/dL (ref 32.0–36.0)
MCV: 97.8 fL (ref 79.3–98.0)
MONO#: 0.5 10*3/uL (ref 0.1–0.9)
MONO%: 7.9 % (ref 0.0–14.0)
NEUT#: 4.4 10*3/uL (ref 1.5–6.5)
NEUT%: 68.7 % (ref 39.0–75.0)
Platelets: 237 10*3/uL (ref 140–400)
RBC: 4.08 10*6/uL — ABNORMAL LOW (ref 4.20–5.82)
RDW: 17.1 % — ABNORMAL HIGH (ref 11.0–14.6)
WBC: 6.4 10*3/uL (ref 4.0–10.3)

## 2015-05-27 LAB — COMPREHENSIVE METABOLIC PANEL (CC13)
ALBUMIN: 3.6 g/dL (ref 3.5–5.0)
ALK PHOS: 65 U/L (ref 40–150)
ALT: 11 U/L (ref 0–55)
AST: 13 U/L (ref 5–34)
Anion Gap: 5 mEq/L (ref 3–11)
BILIRUBIN TOTAL: 0.56 mg/dL (ref 0.20–1.20)
BUN: 15.5 mg/dL (ref 7.0–26.0)
CALCIUM: 8.4 mg/dL (ref 8.4–10.4)
CHLORIDE: 109 meq/L (ref 98–109)
CO2: 26 meq/L (ref 22–29)
Creatinine: 1 mg/dL (ref 0.7–1.3)
EGFR: >90 mL/min/{1.73_m2} (ref >90)
GLUCOSE: 95 mg/dL (ref 70–140)
Potassium: 4 mEq/L (ref 3.5–5.1)
SODIUM: 141 meq/L (ref 136–145)
TOTAL PROTEIN: 6.7 g/dL (ref 6.4–8.3)

## 2015-05-27 NOTE — Telephone Encounter (Signed)
per pof to sch pt appt-gave pt copy of avs °

## 2015-05-27 NOTE — Progress Notes (Signed)
[place]  [phone] Fax:([phone] [place] Note   Patient Care Team: Norman Herrlich, MD as PCP - General Judeth Horn, MD as Consulting Physician (General Surgery) Truitt Merle, MD as Consulting Physician (Hematology) 05/27/2015  CHIEF COMPLAINTS:  Stage III colon cancer   Oncology History   Colon adenocarcinoma   Staging form: Colon and Rectum, AJCC 7th Edition     Pathologic stage from 03/12/2015: Stage IIIB (T4a, N1c, cM0) - Signed by Truitt Merle, MD on 04/05/2015        Colon adenocarcinoma   02/27/2015 Imaging CT abdomen and pelvis showed an annular constricting lesion within the sigmoid colon, no significant adenopathy or distant metastasis. CT abdomen and pelvis on 03/11/2015 showed similar findings with obstruction.   03/12/2015 Initial Diagnosis Sigmoid colon adenocarcinoma   03/12/2015 Tumor Marker CEA normal 2.5   03/12/2015 Surgery Sigmoid colon segmental resection by Dr. Excell Seltzer, surgical margins were negative.   03/12/2015 Pathology Results Well differentiated invasive adenocarcinoma with associated abundant mucin, 14 lymph nodes were negative, a tumor deposit was present, surgical margins were negative. pT4N1c   04/26/2015 -  Chemotherapy Capecitabine po 2037m q12h, 2 weeks on, one week off, as adjuvant chemo     HISTORY OF PRESENTING ILLNESS:  Greg [name] 74y.o. male is here because of stage III colon cancer. He was referred after his recent hospitalization.   He has been having abdominal discomfort and nausea for the past few months, along with some blood in his stool. No significant weight loss or change of his appetite. He presented to the ED 02/27/15 for abdominal pain with x-ray showing possible obstruction but CT scan negative for obstruction or ileus but it did indicate an annular constricting lesion in the sigmoid. He was seen by GI Dr OPaulita Fujitaper colonoscopy failed due to the poor prep.  He presented to ED again on  03/12/2015. CT abd/pelvis showed annular constricting lesion within the sigmoid colon with markedly dilated colon proximal to this lesion with air-fluid levels, compatible with distal colonic obstructing lesion concerning for malignancy as well as tiny hypodensities scattered throughout the liver, nonspecific but favor small cysts. General surgery was called and performed sigmoid colectomy with colostomy on 03/12/2015 by Dr. WHulen Skains The patient's post-operative course was complicated only by hypokalemia which was repeatedly replenished with IV and oral KCl. He received IV hydration and pain management and his diet was slowly advanced as tolerated. Physical and occupational therapy saw the patient and made recommendations. He was discharged to home with home health PT and ostomy care on 03/19/2015.   He has been recovering well from the surgery. He has no significant pain at the surgical site, no nausea, normal stool output in the colostomy bag. He has a mild to moderate fatigue, able to take care of himself and tolerating daily activities. He lives alone, uses walker as needed. He feels his energy level has been recovered well. He has a brother who lives for 5 blocks away, and helps him out when he needs.  CURRENT THERAPY: adjuvant Xeloda 2000 mg every 12 hours, 2 weeks on, one-week off, started on 04/26/2015  INTERIM HISTORY: He returns for follow up. He started second cycle Xeloda on 7/26, and has has been tolerating it well. He notices spots of skin pigmentation on his palms, no pain or tingling on fingers, no nausea, diarrhea, or other issues. He has good appetite and eats well. He has stable mild fatigue, has not changed since he  started chemotherapy. He is able to function well at home. His weight is stable.   MEDICAL HISTORY:  Past Medical History  Diagnosis Date  . Hyperlipidemia   . Hypertension   . Epididymitis     right  . Atony of bladder     followed at [facility]; CIC 3x/day, has  been treated for culture proven serratia marcesens 05/2011, 06/2011, 07/2011, 09/2011  . [name] (benign prostatic hyperplasia)     hyposensitive bladder, nodular prostate with increased PSA (10.23 in 03/2006), s/p prostate biopsy 2006 that was normal (Dr Jeffie Pollock) he recommended TURP.  . Sinus bradycardia   . Chronic knee pain     left  . Gingivitis   . Right shoulder pain     sx in 06/2005  . Frozen shoulder     left frozen shoulder  . Shingles     SURGICAL HISTORY: Past Surgical History  Procedure Laterality Date  . Vasectomy    . Hydrocelectomy  11/14/2010    left hydrocelectomy  . Right shoulder arthroscopic surgery  06/2005  . Transurethral resection of prostate      status post TURP 2/2 [name] in 2008  . Knee surgery      L knee  . Laparotomy N/A 03/12/2015    Procedure: EXPLORATORY LAPAROTOMY;  Surgeon: Judeth Horn, MD;  Location: [facility];  Service: General;  Laterality: N/A;  . Colostomy revision N/A 03/12/2015    Procedure:  RESECTION SIGMOID COLON;  Surgeon: Judeth Horn, MD;  Location: [place];  Service: General;  Laterality: N/A;  . Colostomy N/A 03/12/2015    Procedure: COLOSTOMY;  Surgeon: Judeth Horn, MD;  Location: [facility];  Service: General;  Laterality: N/A;    SOCIAL HISTORY: History   Social History  . Marital Status: Single    Spouse Name: N/A  . Number of Children: N/A  . Years of Education: 12   Occupational History  .      retired   Social History Main Topics  . Smoking status: Former Smoker -- 0.50 packs/day for 25 years    Types: Cigarettes    Quit date: 02/07/1988  . Smokeless tobacco: Not on file  . Alcohol Use: 0.0 oz/week    0 Standard drinks or equivalent per week     Comment: occasional   . Drug Use: No  . Sexual Activity: Not on file   Other Topics Concern  . Not on file   Social History Narrative   Single, lives alone in apartment   No children   Several local siblings in area-will take him to grocery store, etc   Rides bus (75cents/trip)  and bus is right outside his house   Prior employment [employer]   Reports strong faith helps him cope with life   Has had to self-cath twice daily since 2008 -Dr. Jeffie Pollock   [facility] for ostomy care and supplies   Ambulates w/walker-independent in [employer]-cooks pot pies and frozen dinners    FAMILY HISTORY: Family History  Problem Relation Age of Onset  . Cancer Father     unknown type cancer     ALLERGIES:  is allergic to ciprofloxacin hcl and ibuprofen.  MEDICATIONS:  Current Outpatient Prescriptions  Medication Sig Dispense Refill  . capecitabine (XELODA) 500 MG tablet Take 4 tablets (2,000 mg total) by mouth 2 (two) times daily after a meal. 112 tablet 2  . nitrofurantoin, macrocrystal-monohydrate, (MACROBID) 100 MG capsule TK ONE C PO  BID  0  . pravastatin (  PRAVACHOL) 20 MG tablet Take 1 tablet (20 mg total) by mouth daily. (Patient taking differently: Take 20 mg by mouth daily at 6 PM. ) 90 tablet 4  . traMADol (ULTRAM) 50 MG tablet Take 1 tablet (50 mg total) by mouth every 6 (six) hours as needed for moderate pain or severe pain. 30 tablet 0   No current facility-administered medications for this visit.    REVIEW OF SYSTEMS:   Constitutional: Denies fevers, chills or abnormal night sweats Eyes: Denies blurriness of vision, double vision or watery eyes Ears, nose, mouth, throat, and face: Denies mucositis or sore throat Respiratory: Denies cough, dyspnea or wheezes Cardiovascular: Denies palpitation, chest discomfort or lower extremity swelling Gastrointestinal:  Denies nausea, heartburn or change in bowel habits Skin: Denies abnormal skin rashes Lymphatics: Denies new lymphadenopathy or easy bruising Neurological:Denies numbness, tingling or new weaknesses Behavioral/Psych: Mood is stable, no new changes  All other systems were reviewed with the patient and are negative.  PHYSICAL EXAMINATION: ECOG PERFORMANCE STATUS: 1 - Symptomatic but completely  ambulatory  Filed Vitals:   05/27/15 1041  BP: 147/90  Pulse: 59  Temp: 98.2 F (36.8 C)  Resp: 18   Filed Weights   05/27/15 1041  Weight: 183 lb 4.8 oz (83.144 kg)    GENERAL:alert, no distress and comfortable SKIN: skin color, texture, turgor are normal, no rashes or significant lesions EYES: normal, conjunctiva are pink and non-injected, sclera clear OROPHARYNX:no exudate, no erythema and lips, buccal mucosa, and tongue normal  NECK: supple, thyroid normal size, non-tender, without nodularity LYMPH:  no palpable lymphadenopathy in the cervical, axillary or inguinal LUNGS: clear to auscultation and percussion with normal breathing effort HEART: regular rate & rhythm and no murmurs and no lower extremity edema ABDOMEN:abdomen soft, non-tender and normal bowel sounds, surgical incision is well healed, (+) colostomy bag in the left abdomen. Musculoskeletal:no cyanosis of digits and no clubbing  PSYCH: alert & oriented x 3 with fluent speech NEURO: no focal motor/sensory deficits  LABORATORY DATA:  I have reviewed the data as listed  CBC Latest Ref Rng 05/27/2015 05/13/2015 05/06/2015  WBC 4.0 - 10.3 10e3/uL 6.4 5.9 4.9  Hemoglobin 13.0 - 17.1 g/dL 13.0 12.4(L) 12.5(L)  Hematocrit 38.4 - 49.9 % 39.9 38.0(L) 38.4  Platelets 140 - 400 10e3/uL 237 287 304    CMP Latest Ref Rng 05/13/2015 05/06/2015 04/01/2015  Glucose 70 - 140 mg/dl 97 90 78  BUN 7.0 - 26.0 mg/dL 14.9 15.6 12  Creatinine 0.7 - 1.3 mg/dL 0.8 0.9 1.05  Sodium 136 - 145 mEq/L 140 140 140  Potassium 3.5 - 5.1 mEq/L 4.1 3.9 4.3  Chloride 96 - 112 mEq/L - - 104  CO2 22 - 29 mEq/L _0 Calcium 8.4 - 10.4 mg/dL 8.9 8.6 9.0  Total Protein 6.4 - 8.3 g/dL 6.8 6.4 -  Total Bilirubin 0.20 - 1.20 mg/dL 0.43 0.52 -  Alkaline Phos 40 - 150 U/L 60 58 -  AST 5 - 34 U/L 14 12 -  ALT 0 - 55 U/L 6 7 -      CEA  Status: Finalresult Visible to patient:  Not Released Nextappt: Today at 11:00 AM in Oncology ([place])          Ref Range 3wk ago    CEA 0.0 - 4.7 ng/mL 2.5         PATHOLOGY REPORT  Diagnosis 03/12/2015 Colon, segmental resection for tumor, sigmoid colon - INVASIVE WELL-DIFFERENTIATED ADENOCARCINOMA WITH  ASSOCIATED ABUNDANT MUCIN, SPANNING 7 CM IN GREATEST DIMENSION. 1 of 4 - TUMOR INVADES THROUGH MUSCULARIS PROPRIA THROUGH PERICOLORECTAL SOFT TISSUES TO INVOLVE SEROSAL SURFACE. - MARGINS ARE NEGATIVE. - FOURTEEN BENIGN LYMPH NODES WITH NO TUMOR SEEN (0/14). - SINGLE SOFT TISSUE SATELLITE  Microscopic Comment COLON AND RECTUM: Specimen: Sigmoid colon. Procedure: Resection of sigmoid colon. Tumor site: Sigmoid colon, distal aspect of specimen. Specimen integrity: Intact.  Macroscopic tumor perforation: Not identified. Invasive tumor: Maximum size: 7 cm. Histologic type(s): Invasive adenocarcinoma with abundant extracellular mucin. Histologic grade and differentiation: G1: well differentiated/low grade Type of polyp in which invasive carcinoma arose: Precursor polyp is not identified. Microscopic extension of invasive tumor: Tumor invades through muscularis propria through pericolorectal soft tissues to involve serosal surface. Lymph-Vascular invasion: Definitive lymph/vascular invasion is not identified; however, a tumor deposit is present, see below. Peri-neural invasion: Not identified. Tumor deposit(s) (discontinuous extramural extension): Yes, tumor deposit present. Resection margins: Proximal margin: Negative. Distal margin: Negative. Mesenteric margin (sigmoid and transverse): Negative. Distance closest margin (if all above margins negative): 3.5 cm (mesenteric margin). Treatment effect (neo-adjuvant therapy): Not applicable. Additional polyp(s): No additional polyps identified. Non-neoplastic findings: No significant non-neoplastic findings. Lymph nodes: number examined 14; number positive: 0. Pathologic Staging: pT4a, pN1c. Ancillary  studies: Per colorectal cancer protocol, MMR by IHC and MSI by PCR will be performed on the current tumor. (RH:ecj 03/16/2015)  ADDITIONAL INFORMATION: Mismatch Repair (MMR) Protein Immunohistochemistry (IHC) IHC Expression Result: MLH1: Preserved nuclear expression (greater 50% tumor expression) MSH2: Preserved nuclear expression (greater 50% tumor expression) MSH6: Preserved nuclear expression (greater 50% tumor expression) PMS2: Preserved nuclear expression (greater 50% tumor expression) * Internal control demonstrates intact nuclear expression Interpretation: NORMAL    RADIOGRAPHIC STUDIES: I have personally reviewed the radiological images as listed and agreed with the findings in the report.  Ct Abdomen Pelvis W Contrast 03/11/2015    FINDINGS: Lower chest: Linear subsegmental atelectasis in the lung bases. No effusions. Heart is upper limits normal in size.  Hepatobiliary: Scattered small nonspecific hypodensities throughout the liver, stable. Gallbladder unremarkable. No biliary ductal dilatation.  Pancreas: No focal abnormality or ductal dilatation.  Spleen: No focal abnormality.  Normal size.  Adrenals/Urinary Tract: No focal renal or adrenal abnormality. No hydronephrosis. Urinary bladder is unremarkable.  Stomach/Bowel: The colon is dilated and fluid-filled with air-fluid levels. Stomach and small bowel are decompressed. The colon is dilated to an annular constricting lesion within the sigmoid colon.  Vascular/Lymphatic: No retroperitoneal or mesenteric adenopathy. Aorta normal caliber.  Reproductive: No mass or other significant abnormality.  Other: No free fluid or free air.  Musculoskeletal: Degenerative changes in the lumbar spine. No acute findings.   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 Findings compatible with distal colonic obstructing lesion concerning for malignancy.  Tiny  hypodensities scattered throughout the liver, nonspecific, favor small cysts.   Electronically Signed   By: Rolm Baptise M.D.   On: 03/11/2015 21:17    ASSESSMENT & PLAN: 74 year old African-American male  1. Sigmoid colon cancer, pT4N1cM0, stage IIIB, grade 1, MMR normal -I reviewed his CT abdomen and pelvis findings, and his surgical pathology results in great details with the patient. -I'll obtain a CT chest without contrast to complete staging. -Giving the advanced stage IIIB disease, he doesn't have moderate to high risk of cancer recurrence after surgery. -We discussed the role of adjuvant chemotherapy to reduce the risk of cancer recurrence, the standard is 5-FU  or capecitabine along with oxaliplatin. Although he is 55, his general health and performance status is relatively good, I think he would be a candidate for FOLFOX. The benefits and risks of chemotherapy were discussed with patient, he agrees to proceed. Alternatively we can consider 5-FU or capecitabine alone, if he has difficulty tolerating FOLFOX. -He lateron changed his mind to single agent Xeloda. He is on cycle 2 now, tolerating well. We'll continue, total of 6 months treatment. -Lab reviewed, no significant cytopenia. He only has mild skin pigmentation,or significant toxicity, we'll continue cycle 2, he will finish this cycle on August 15 -Repeat CBC before cycle 3  2. Atony of bladder -He does straight cath twice a day, and on chronic Bactrim to prevent UTI. -He'll continue follow-up with his urologist. -We discussed the increased risk of UTI and other infection with chemotherapy  3. Hypertension, arthritis -He will continue follow-up with his primary care physician  Plan -Continue cycle 2 Xeloda, finish on 7/8, then off one week  -Lab CBC on 8/15 before he starts cycle 3 on 8/16 -I'll see him back towards the end of cycle 3 on September 1.   All questions were answered. The patient knows to call the clinic with any  problems, questions or concerns. I spent 20 minutes counseling the patient face to face. The total time spent in the appointment was 25 minutes and more than 50% was on counseling.     Truitt Merle, MD 05/27/2015 10:42 AM

## 2015-05-28 ENCOUNTER — Emergency Department (HOSPITAL_COMMUNITY): Payer: Medicare Other

## 2015-05-28 ENCOUNTER — Other Ambulatory Visit: Payer: Self-pay

## 2015-05-28 ENCOUNTER — Emergency Department (HOSPITAL_COMMUNITY)
Admission: EM | Admit: 2015-05-28 | Discharge: 2015-05-28 | Disposition: A | Discharge status: Home / Self Care | Payer: Medicare Other | Attending: Emergency Medicine | Admitting: Emergency Medicine

## 2015-05-28 ENCOUNTER — Encounter (HOSPITAL_COMMUNITY): Payer: Self-pay | Admitting: *Deleted

## 2015-05-28 DIAGNOSIS — Z8719 Personal history of other diseases of the digestive system: Secondary | ICD-10-CM | POA: Diagnosis not present

## 2015-05-28 DIAGNOSIS — Z87891 Personal history of nicotine dependence: Secondary | ICD-10-CM | POA: Diagnosis not present

## 2015-05-28 DIAGNOSIS — N390 Urinary tract infection, site not specified: Secondary | ICD-10-CM | POA: Insufficient documentation

## 2015-05-28 DIAGNOSIS — E785 Hyperlipidemia, unspecified: Secondary | ICD-10-CM | POA: Insufficient documentation

## 2015-05-28 DIAGNOSIS — Z79899 Other long term (current) drug therapy: Secondary | ICD-10-CM | POA: Diagnosis not present

## 2015-05-28 DIAGNOSIS — R42 Dizziness and giddiness: Secondary | ICD-10-CM | POA: Diagnosis not present

## 2015-05-28 DIAGNOSIS — R404 Transient alteration of awareness: Secondary | ICD-10-CM | POA: Diagnosis not present

## 2015-05-28 DIAGNOSIS — I10 Essential (primary) hypertension: Secondary | ICD-10-CM | POA: Diagnosis not present

## 2015-05-28 DIAGNOSIS — Z87438 Personal history of other diseases of male genital organs: Secondary | ICD-10-CM | POA: Diagnosis not present

## 2015-05-28 DIAGNOSIS — Z8619 Personal history of other infectious and parasitic diseases: Secondary | ICD-10-CM | POA: Diagnosis not present

## 2015-05-28 DIAGNOSIS — G8929 Other chronic pain: Secondary | ICD-10-CM | POA: Diagnosis not present

## 2015-05-28 LAB — URINALYSIS, ROUTINE W REFLEX MICROSCOPIC
Bilirubin Urine: NEGATIVE
Glucose, UA: NEGATIVE mg/dL
KETONES UR: NEGATIVE mg/dL
Nitrite: NEGATIVE
PH: 7 (ref 5.0–8.0)
Protein, ur: NEGATIVE mg/dL
Specific Gravity, Urine: 1.012 (ref 1.005–1.030)
UROBILINOGEN UA: 0.2 mg/dL (ref 0.0–1.0)

## 2015-05-28 LAB — CBC WITH DIFFERENTIAL/PLATELET
BASOS ABS: 0.1 10*3/uL (ref 0.0–0.1)
Basophils Relative: 1 % (ref 0–1)
EOS ABS: 0.1 10*3/uL (ref 0.0–0.7)
EOS PCT: 1 % (ref 0–5)
HCT: 37.1 % — ABNORMAL LOW (ref 39.0–52.0)
Hemoglobin: 12 g/dL — ABNORMAL LOW (ref 13.0–17.0)
LYMPHS ABS: 1.1 10*3/uL (ref 0.7–4.0)
LYMPHS PCT: 23 % (ref 12–46)
MCH: 31.3 pg (ref 26.0–34.0)
MCHC: 32.3 g/dL (ref 30.0–36.0)
MCV: 96.9 fL (ref 78.0–100.0)
MONO ABS: 0.5 10*3/uL (ref 0.1–1.0)
Monocytes Relative: 10 % (ref 3–12)
Neutro Abs: 3.1 10*3/uL (ref 1.7–7.7)
Neutrophils Relative %: 65 % (ref 43–77)
Platelets: 228 10*3/uL (ref 150–400)
RBC: 3.83 MIL/uL — AB (ref 4.22–5.81)
RDW: 16.5 % — ABNORMAL HIGH (ref 11.5–15.5)
WBC: 4.8 10*3/uL (ref 4.0–10.5)

## 2015-05-28 LAB — BASIC METABOLIC PANEL
Anion gap: 5 (ref 5–15)
BUN: 15 mg/dL (ref 6–20)
CO2: 28 mmol/L (ref 22–32)
Calcium: 8.5 mg/dL — ABNORMAL LOW (ref 8.9–10.3)
Chloride: 107 mmol/L (ref 101–111)
Creatinine, Ser: 0.8 mg/dL (ref 0.61–1.24)
GFR calc Af Amer: >60 mL/min (ref >60)
GFR calc non Af Amer: >60 mL/min (ref >60)
Glucose, Bld: 95 mg/dL (ref 65–99)
Potassium: 4.7 mmol/L (ref 3.5–5.1)
SODIUM: 140 mmol/L (ref 135–145)

## 2015-05-28 LAB — URINE MICROSCOPIC-ADD ON

## 2015-05-28 LAB — I-STAT TROPONIN, ED: TROPONIN I, POC: 0 ng/mL (ref 0.00–0.08)

## 2015-05-28 MED ORDER — CEPHALEXIN 250 MG PO CAPS: 250.0000 mg | ORAL_CAPSULE | Freq: Four times a day (QID) | ORAL | Status: DC

## 2015-05-28 NOTE — ED Notes (Signed)
Bed: QK86 Expected date:  Expected time:  Means of arrival:  Comments: EMS-Dizziness

## 2015-05-28 NOTE — Discharge Instructions (Signed)
You do have a bladder infection. Take keflex as prescribed until all gone. Follow up with your urologist and primary care doctor. Return if continue to have dizziness or any new concerning symptom.   Urinary Tract Infection Urinary tract infections (UTIs) can develop anywhere along your urinary tract. Your urinary tract is your body's drainage system for removing wastes and extra water. Your urinary tract includes two kidneys, two ureters, a bladder, and a urethra. Your kidneys are a pair of bean-shaped organs. Each kidney is about the size of your fist. They are located below your ribs, one on each side of your spine. CAUSES Infections are caused by microbes, which are microscopic organisms, including fungi, viruses, and bacteria. These organisms are so small that they can only be seen through a microscope. Bacteria are the microbes that most commonly cause UTIs. SYMPTOMS  Symptoms of UTIs may vary by age and gender of the patient and by the location of the infection. Symptoms in young women typically include a frequent and intense urge to urinate and a painful, burning feeling in the bladder or urethra during urination. Older women and men are more likely to be tired, shaky, and weak and have muscle aches and abdominal pain. A fever may mean the infection is in your kidneys. Other symptoms of a kidney infection include pain in your back or sides below the ribs, nausea, and vomiting. DIAGNOSIS To diagnose a UTI, your caregiver will ask you about your symptoms. Your caregiver also will ask to provide a urine sample. The urine sample will be tested for bacteria and white blood cells. White blood cells are made by your body to help fight infection. TREATMENT  Typically, UTIs can be treated with medication. Because most UTIs are caused by a bacterial infection, they usually can be treated with the use of antibiotics. The choice of antibiotic and length of treatment depend on your symptoms and the type of  bacteria causing your infection. HOME CARE INSTRUCTIONS  If you were prescribed antibiotics, take them exactly as your caregiver instructs you. Finish the medication even if you feel better after you have only taken some of the medication.  Drink enough water and fluids to keep your urine clear or pale yellow.  Avoid caffeine, tea, and carbonated beverages. They tend to irritate your bladder.  Empty your bladder often. Avoid holding urine for long periods of time.  Empty your bladder before and after sexual intercourse.  After a bowel movement, women should cleanse from front to back. Use each tissue only once. SEEK MEDICAL CARE IF:   You have back pain.  You develop a fever.  Your symptoms do not begin to resolve within 3 days. SEEK IMMEDIATE MEDICAL CARE IF:   You have severe back pain or lower abdominal pain.  You develop chills.  You have nausea or vomiting.  You have continued burning or discomfort with urination. MAKE SURE YOU:   Understand these instructions.  Will watch your condition.  Will get help right away if you are not doing well or get worse. Document Released: 07/18/2005 Document Revised: 04/08/2012 Document Reviewed: 11/16/2011 [facility] Patient Information 2015 [place], [place]. This information is not intended to replace advice given to you by your health care provider. Make sure you discuss any questions you have with your health care provider.

## 2015-05-28 NOTE — ED Provider Notes (Signed)
CSN: [id number]     Arrival date & time 05/28/15  1148 History   First MD Initiated Contact with Patient 05/28/15 1156     Chief Complaint  Patient presents with  . Dizziness     (Consider location/radiation/quality/duration/timing/severity/associated sxs/prior Treatment) HPI Greg [name] is a 74 y.o. male with history of hypertension, hyperlipidemia, sinus bradycardia, presents to emergency department complaining of near syncopal episode. Patient states he was washing his face and his ankle and he suddenly became dizzy. States he felt like everything was spinning around him. He states "I felt like I was in the dream." Patient states he had to lean against the wall so he didn't fall. Patient states a few minutes later after sitting down his symptoms improved. He ended up calling 911 because he was worried since this has never happened before. He states he feels normal now. He denies any headache. No visual changes. No ringing or pain in his ears. No congestion. No recent falls or head injury. Denies any chest pain or shortness of breath. Denies any other complaints.  Past Medical History  Diagnosis Date  . Hyperlipidemia   . Hypertension   . Epididymitis     right  . Atony of bladder     followed at [facility]; CIC 3x/day, has been treated for culture proven serratia marcesens 05/2011, 06/2011, 07/2011, 09/2011  . [name] (benign prostatic hyperplasia)     hyposensitive bladder, nodular prostate with increased PSA (10.23 in 03/2006), s/p prostate biopsy 2006 that was normal (Dr Jeffie Pollock) he recommended TURP.  . Sinus bradycardia   . Chronic knee pain     left  . Gingivitis   . Right shoulder pain     sx in 06/2005  . Frozen shoulder     left frozen shoulder  . Shingles    Past Surgical History  Procedure Laterality Date  . Vasectomy    . Hydrocelectomy  11/14/2010    left hydrocelectomy  . Right shoulder arthroscopic surgery  06/2005  . Transurethral resection of prostate     status post TURP 2/2 [name] in 2008  . Knee surgery      L knee  . Laparotomy N/A 03/12/2015    Procedure: EXPLORATORY LAPAROTOMY;  Surgeon: Judeth Horn, MD;  Location: [place];  Service: General;  Laterality: N/A;  . Colostomy revision N/A 03/12/2015    Procedure:  RESECTION SIGMOID COLON;  Surgeon: Judeth Horn, MD;  Location: [place];  Service: General;  Laterality: N/A;  . Colostomy N/A 03/12/2015    Procedure: COLOSTOMY;  Surgeon: Judeth Horn, MD;  Location: [facility] OR;  Service: General;  Laterality: N/A;   Family History  Problem Relation Age of Onset  . Cancer Father     unknown type cancer    History  Substance Use Topics  . Smoking status: Former Smoker -- 0.50 packs/day for 25 years    Types: Cigarettes    Quit date: 02/07/1988  . Smokeless tobacco: Not on file  . Alcohol Use: 0.0 oz/week    0 Standard drinks or equivalent per week     Comment: occasional     Review of Systems  Constitutional: Negative for fever and chills.  Respiratory: Negative for cough, chest tightness and shortness of breath.   Cardiovascular: Negative for chest pain, palpitations and leg swelling.  Gastrointestinal: Negative for nausea, vomiting, abdominal pain, diarrhea and abdominal distention.  Genitourinary: Negative for dysuria, urgency, frequency and hematuria.  Musculoskeletal: Negative for myalgias, arthralgias, neck pain and neck stiffness.  Skin: Negative for rash.  Allergic/Immunologic: Negative for immunocompromised state.  Neurological: Positive for dizziness and light-headedness. Negative for weakness, numbness and headaches.  All other systems reviewed and are negative.     Allergies  Ciprofloxacin hcl and Ibuprofen  Home Medications   Prior to Admission medications   Medication Sig Start Date End Date Taking? Authorizing Provider  capecitabine (XELODA) 500 MG tablet Take 4 tablets (2,000 mg total) by mouth 2 (two) times daily after a meal. 04/08/15  Yes Truitt Merle, MD  nitrofurantoin,  macrocrystal-monohydrate, (MACROBID) 100 MG capsule TK ONE C PO  daily 04/29/15  Yes Historical Provider, MD  pravastatin (PRAVACHOL) 20 MG tablet Take 1 tablet (20 mg total) by mouth daily. Patient taking differently: Take 20 mg by mouth daily at 6 PM.  01/28/15  Yes Norman Herrlich, MD  traMADol (ULTRAM) 50 MG tablet Take 1 tablet (50 mg total) by mouth every 6 (six) hours as needed for moderate pain or severe pain. Patient not taking: Reported on 05/28/2015 03/19/15   Corky Sox, MD   BP 139/76 mmHg  Pulse 91  Temp(Src) 98 F (36.7 C)  Resp 20  SpO2 100% Physical Exam  Constitutional: He is oriented to person, place, and time. He appears well-developed and well-nourished. No distress.  HENT:  Head: Normocephalic and atraumatic.  Eyes: Conjunctivae are normal.  Neck: Neck supple.  Cardiovascular: Normal rate, regular rhythm and normal heart sounds.   Pulmonary/Chest: Effort normal. No respiratory distress. He has no wheezes. He has no rales.  Abdominal: Soft. Bowel sounds are normal. He exhibits no distension. There is no tenderness. There is no rebound.  Musculoskeletal: He exhibits no edema.  Neurological: He is alert and oriented to person, place, and time. No cranial nerve deficit. Coordination normal.  5/5 and equal upper and lower extremity strength bilaterally. Equal grip strength bilaterally. Normal finger to nose and heel to shin. No pronator drift. Patellar reflexes 2+   Skin: Skin is warm and dry.  Nursing note and vitals reviewed.   ED Course  Procedures (including critical care time) Labs Review Labs Reviewed  CBC WITH DIFFERENTIAL/PLATELET - Abnormal; Notable for the following:    RBC 3.83 (*)    Hemoglobin 12.0 (*)    HCT 37.1 (*)    RDW 16.5 (*)    All other components within normal limits  BASIC METABOLIC PANEL - Abnormal; Notable for the following:    Calcium 8.5 (*)    All other components within normal limits  URINALYSIS, ROUTINE W REFLEX MICROSCOPIC (NOT AT  [facility]) - Abnormal; Notable for the following:    APPearance CLOUDY (*)    Hgb urine dipstick TRACE (*)    Leukocytes, UA LARGE (*)    All other components within normal limits  URINE MICROSCOPIC-ADD ON - Abnormal; Notable for the following:    Bacteria, UA FEW (*)    All other components within normal limits  URINE CULTURE  I-STAT TROPOININ, ED    Imaging Review Dg Chest 2 View  05/28/2015   CLINICAL DATA:  74 year old male with a history of dizziness.  EXAM: CHEST - 2 VIEW  COMPARISON:  Chest x-ray 03/11/2015, 02/27/2015, chest CT 04/21/2015  FINDINGS: Cardiomediastinal silhouette unchanged in size and contour.  Lungs are well aerated. No confluent airspace disease. No pneumothorax or pleural effusion.  Coarsened interstitial markings at the bilateral lung bases.  No displaced fracture.  Degenerative changes of the right acromioclavicular joint.  Unremarkable appearance of the upper abdomen.  IMPRESSION: Coarsened interstitial markings at the bilateral lung bases, likely chronic, though superimposed acute bronchitis or atypical infection difficult to exclude. No evidence of lobar pneumonia or pleural effusion.  Degenerative changes of the right acromioclavicular joint again noted.  Signed,  Dulcy Fanny. Earleen Newport, DO  [facility]  [facility]   Electronically Signed   By: Corrie Mckusick D.O.   On: 05/28/2015 13:57     EKG Interpretation   Date/Time:  Saturday May 28 2015 14:15:08 EDT Ventricular Rate:  49 PR Interval:  152 QRS Duration: 84 QT Interval:  485 QTC Calculation: 438 R Axis:   -3 Text Interpretation:  Sinus bradycardia Posterior infarct, old ED  PHYSICIAN INTERPRETATION AVAILABLE IN [place] [place] Confirmed by TEST,  Record ([id number]) on 05/29/2015 8:15:05 AM      MDM   Final diagnoses:  UTI (lower urinary tract infection)  Vertigo    Patient emergency department with episode of near syncope versus brief episode of vertigo.  Patient is fully asymptomatic now. Normal neurological exam. Will check labs, EKG,urinalysis.   Labs all unremarkable, patient continues to be asymptomatic. He was ambulated in the hallway with no difficulties. He is not orthostatic. He is bradycardic, history of the same. He is not hypoxic, no concern for possible PE. Does not sound like an arrhythmia. Patient describes sensation of things spinning around. Given his symptoms improved and unremarkable labs I do not think this isa posterior circulation stroke, however was considered. Patient does have a UTI, will treat. Patient is adamant about going home.Given strict return precautions. Will treat UTI with Keflex.  Filed Vitals:   05/28/15 1157 05/28/15 1205 05/28/15 1445 05/28/15 1624  BP: 139/76   142/80  Pulse: 91  58 50  Temp: 98 F (36.7 C)     Resp: 20  19 14   SpO2: 100% 100% 100% 99%       Jeannett Senior, PA-C 05/29/15 1435  Leo Grosser, MD 05/29/15 2307

## 2015-05-28 NOTE — ED Notes (Signed)
Pt was in bathroom washing face and became dizzy.  He leaned against the wall then walked in the kitchen and sat down.  There was no fall or LOC.  He states that this is the first time this has happened to him.  No c/o pain.    Pt has colostomy - surgery in May 2016.  Stoma is pink and healthy appearing.    Denies fevers, nausea, vomiting or diarrhea.  He states he has been eating and drinking okay.

## 2015-06-06 ENCOUNTER — Telehealth: Payer: Self-pay | Admitting: *Deleted

## 2015-06-06 ENCOUNTER — Other Ambulatory Visit: Payer: Medicare Other

## 2015-06-06 NOTE — Telephone Encounter (Signed)
Pt did not come for lab today 8/15.   Spoke with pt today, and was informed that pt missed the bus and had no transportation.  Pt is on week break from Xeloda.   Gave pt appt for lab at 0930 on 06/07/15.  Pt understood not to restart Xeloda on 06/07/15  until Dr. Burr Medico reviews lab results, and collaborative nurse will contact pt with md's instructions.

## 2015-06-07 ENCOUNTER — Other Ambulatory Visit (HOSPITAL_BASED_OUTPATIENT_CLINIC_OR_DEPARTMENT_OTHER): Payer: Medicare Other

## 2015-06-07 DIAGNOSIS — C187 Malignant neoplasm of sigmoid colon: Secondary | ICD-10-CM | POA: Diagnosis present

## 2015-06-07 DIAGNOSIS — C189 Malignant neoplasm of colon, unspecified: Secondary | ICD-10-CM

## 2015-06-07 LAB — CBC WITH DIFFERENTIAL/PLATELET
BASO%: 1 % (ref 0.0–2.0)
Basophils Absolute: 0.1 10*3/uL (ref 0.0–0.1)
EOS ABS: 0.1 10*3/uL (ref 0.0–0.5)
EOS%: 2.2 % (ref 0.0–7.0)
HCT: 40.1 % (ref 38.4–49.9)
HEMOGLOBIN: 13.1 g/dL (ref 13.0–17.1)
LYMPH#: 1.5 10*3/uL (ref 0.9–3.3)
LYMPH%: 27.1 % (ref 14.0–49.0)
MCH: 32.5 pg (ref 27.2–33.4)
MCHC: 32.6 g/dL (ref 32.0–36.0)
MCV: 99.6 fL — AB (ref 79.3–98.0)
MONO#: 0.6 10*3/uL (ref 0.1–0.9)
MONO%: 11.1 % (ref 0.0–14.0)
NEUT#: 3.3 10*3/uL (ref 1.5–6.5)
NEUT%: 58.6 % (ref 39.0–75.0)
PLATELETS: 231 10*3/uL (ref 140–400)
RBC: 4.02 10*6/uL — AB (ref 4.20–5.82)
RDW: 19.1 % — AB (ref 11.0–14.6)
WBC: 5.7 10*3/uL (ref 4.0–10.3)

## 2015-06-07 LAB — COMPREHENSIVE METABOLIC PANEL (CC13)
ALBUMIN: 3.6 g/dL (ref 3.5–5.0)
ALK PHOS: 65 U/L (ref 40–150)
ALT: 9 U/L (ref 0–55)
ANION GAP: 6 meq/L (ref 3–11)
AST: 12 U/L (ref 5–34)
BUN: 17.9 mg/dL (ref 7.0–26.0)
CO2: 27 meq/L (ref 22–29)
CREATININE: 1 mg/dL (ref 0.7–1.3)
Calcium: 8.7 mg/dL (ref 8.4–10.4)
Chloride: 109 mEq/L (ref 98–109)
EGFR: 84 mL/min/{1.73_m2} — AB (ref >90)
GLUCOSE: 101 mg/dL (ref 70–140)
Potassium: 4.8 mEq/L (ref 3.5–5.1)
Sodium: 142 mEq/L (ref 136–145)
Total Bilirubin: 0.53 mg/dL (ref 0.20–1.20)
Total Protein: 6.6 g/dL (ref 6.4–8.3)

## 2015-06-08 ENCOUNTER — Telehealth: Payer: Self-pay | Admitting: *Deleted

## 2015-06-08 NOTE — Telephone Encounter (Signed)
Called pt & he states that he started his xeloda this am.  Labs were OK from 06/07/15 & OK to start per Dr Burr Medico.  Reminded pt that we need to look at labs & let him know if OK to start before he restarts this med in the future.  He expressed understanding.

## 2015-06-09 ENCOUNTER — Emergency Department (HOSPITAL_COMMUNITY)
Admission: EM | Admit: 2015-06-09 | Discharge: 2015-06-10 | Disposition: A | Discharge status: Home / Self Care | Payer: Medicare Other | Attending: Emergency Medicine | Admitting: Emergency Medicine

## 2015-06-09 ENCOUNTER — Emergency Department (HOSPITAL_COMMUNITY): Payer: Medicare Other

## 2015-06-09 ENCOUNTER — Encounter (HOSPITAL_COMMUNITY): Payer: Self-pay | Admitting: *Deleted

## 2015-06-09 DIAGNOSIS — I10 Essential (primary) hypertension: Secondary | ICD-10-CM | POA: Diagnosis not present

## 2015-06-09 DIAGNOSIS — R109 Unspecified abdominal pain: Secondary | ICD-10-CM | POA: Diagnosis not present

## 2015-06-09 DIAGNOSIS — E785 Hyperlipidemia, unspecified: Secondary | ICD-10-CM | POA: Insufficient documentation

## 2015-06-09 DIAGNOSIS — Z87438 Personal history of other diseases of male genital organs: Secondary | ICD-10-CM | POA: Insufficient documentation

## 2015-06-09 DIAGNOSIS — K5900 Constipation, unspecified: Secondary | ICD-10-CM | POA: Diagnosis not present

## 2015-06-09 DIAGNOSIS — Z87891 Personal history of nicotine dependence: Secondary | ICD-10-CM | POA: Diagnosis not present

## 2015-06-09 DIAGNOSIS — Z87448 Personal history of other diseases of urinary system: Secondary | ICD-10-CM | POA: Insufficient documentation

## 2015-06-09 DIAGNOSIS — Z8619 Personal history of other infectious and parasitic diseases: Secondary | ICD-10-CM | POA: Diagnosis not present

## 2015-06-09 DIAGNOSIS — R1033 Periumbilical pain: Secondary | ICD-10-CM | POA: Diagnosis present

## 2015-06-09 DIAGNOSIS — Z79899 Other long term (current) drug therapy: Secondary | ICD-10-CM | POA: Insufficient documentation

## 2015-06-09 DIAGNOSIS — K6289 Other specified diseases of anus and rectum: Secondary | ICD-10-CM | POA: Diagnosis not present

## 2015-06-09 DIAGNOSIS — Z933 Colostomy status: Secondary | ICD-10-CM | POA: Diagnosis not present

## 2015-06-09 DIAGNOSIS — G8929 Other chronic pain: Secondary | ICD-10-CM | POA: Insufficient documentation

## 2015-06-09 DIAGNOSIS — Z859 Personal history of malignant neoplasm, unspecified: Secondary | ICD-10-CM | POA: Diagnosis not present

## 2015-06-09 HISTORY — DX: Malignant (primary) neoplasm, unspecified: C80.1

## 2015-06-09 LAB — COMPREHENSIVE METABOLIC PANEL
ALBUMIN: 3.9 g/dL (ref 3.5–5.0)
ALT: 10 U/L — ABNORMAL LOW (ref 17–63)
ANION GAP: 6 (ref 5–15)
AST: 16 U/L (ref 15–41)
Alkaline Phosphatase: 67 U/L (ref 38–126)
BUN: 16 mg/dL (ref 6–20)
CHLORIDE: 104 mmol/L (ref 101–111)
CO2: 31 mmol/L (ref 22–32)
Calcium: 9 mg/dL (ref 8.9–10.3)
Creatinine, Ser: 1.02 mg/dL (ref 0.61–1.24)
GFR calc Af Amer: >60 mL/min (ref >60)
GFR calc non Af Amer: >60 mL/min (ref >60)
Glucose, Bld: 93 mg/dL (ref 65–99)
POTASSIUM: 4 mmol/L (ref 3.5–5.1)
SODIUM: 141 mmol/L (ref 135–145)
TOTAL PROTEIN: 7 g/dL (ref 6.5–8.1)
Total Bilirubin: 0.7 mg/dL (ref 0.3–1.2)

## 2015-06-09 LAB — I-STAT CHEM 8, ED
BUN: 21 mg/dL — AB (ref 6–20)
CHLORIDE: 103 mmol/L (ref 101–111)
CREATININE: 1 mg/dL (ref 0.61–1.24)
Calcium, Ion: 1.16 mmol/L (ref 1.13–1.30)
GLUCOSE: 91 mg/dL (ref 65–99)
HEMATOCRIT: 44 % (ref 39.0–52.0)
HEMOGLOBIN: 15 g/dL (ref 13.0–17.0)
POTASSIUM: 3.9 mmol/L (ref 3.5–5.1)
Sodium: 143 mmol/L (ref 135–145)
TCO2: 28 mmol/L (ref 0–100)

## 2015-06-09 LAB — CBC WITH DIFFERENTIAL/PLATELET
BASOS ABS: 0.1 10*3/uL (ref 0.0–0.1)
Basophils Relative: 1 % (ref 0–1)
EOS ABS: 0.1 10*3/uL (ref 0.0–0.7)
Eosinophils Relative: 2 % (ref 0–5)
HCT: 39.9 % (ref 39.0–52.0)
Hemoglobin: 13.6 g/dL (ref 13.0–17.0)
Lymphocytes Relative: 29 % (ref 12–46)
Lymphs Abs: 1.6 10*3/uL (ref 0.7–4.0)
MCH: 33.3 pg (ref 26.0–34.0)
MCHC: 34.1 g/dL (ref 30.0–36.0)
MCV: 97.8 fL (ref 78.0–100.0)
MONO ABS: 0.7 10*3/uL (ref 0.1–1.0)
MONOS PCT: 12 % (ref 3–12)
NEUTROS ABS: 3 10*3/uL (ref 1.7–7.7)
NEUTROS PCT: 56 % (ref 43–77)
PLATELETS: 252 10*3/uL (ref 150–400)
RBC: 4.08 MIL/uL — ABNORMAL LOW (ref 4.22–5.81)
RDW: 17.5 % — AB (ref 11.5–15.5)
WBC: 5.4 10*3/uL (ref 4.0–10.5)

## 2015-06-09 LAB — LIPASE, BLOOD: Lipase: 33 U/L (ref 22–51)

## 2015-06-09 MED ORDER — IOHEXOL 300 MG/ML  SOLN
100.0000 mL | Freq: Once | INTRAMUSCULAR | Status: AC | PRN
  Administered 2015-06-09: 100 mL via INTRAVENOUS

## 2015-06-09 MED ORDER — IOHEXOL 300 MG/ML  SOLN: 25.0000 mL | Freq: Once | INTRAMUSCULAR | Status: DC | PRN

## 2015-06-09 NOTE — ED Provider Notes (Signed)
CSN: [id number]     Arrival date & time 06/09/15  2037 History   First MD Initiated Contact with Patient 06/09/15 2156     Chief Complaint  Patient presents with  . Abdominal Pain     (Consider location/radiation/quality/duration/timing/severity/associated sxs/prior Treatment) Patient is a 74 y.o. male presenting with abdominal pain. The history is provided by the patient.  Abdominal Pain Pain location:  Periumbilical Pain quality: aching   Pain radiates to:  Does not radiate Pain severity:  Mild Onset quality:  Sudden Duration:  2 days Timing:  Intermittent Progression:  Worsening Chronicity:  New Relieved by:  Nothing Worsened by:  Nothing tried Ineffective treatments:  None tried Associated symptoms: constipation   Associated symptoms: no chest pain, no chills, no diarrhea, no fever, no shortness of breath and no vomiting    74 yo M with a chief complaint of abdominal pain this been going on for the past couple days. 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 Since then patient has been doing okay but started having pain around his colostomy site. Patient feels like he is constipated. Feels like he was they have a normal bowel movement but is unable to. Patient denies any fevers or chills denies any vomiting. Denies chest pain.  Past Medical History  Diagnosis Date  . Hyperlipidemia   . Hypertension   . Epididymitis     right  . Atony of bladder     followed at [facility]; CIC 3x/day, has been treated for culture proven serratia marcesens 05/2011, 06/2011, 07/2011, 09/2011  . [name] (benign prostatic hyperplasia)     hyposensitive bladder, nodular prostate with increased PSA (10.23 in 03/2006), s/p prostate biopsy 2006 that was normal (Dr Jeffie Pollock) he recommended TURP.  . Sinus bradycardia   . Chronic knee pain     left  . Gingivitis   . Right shoulder pain     sx in 06/2005  . Frozen shoulder     left frozen shoulder  . Shingles   .  Cancer    Past Surgical History  Procedure Laterality Date  . Vasectomy    . Hydrocelectomy  11/14/2010    left hydrocelectomy  . Right shoulder arthroscopic surgery  06/2005  . Transurethral resection of prostate      status post TURP 2/2 [name] in 2008  . Knee surgery      L knee  . Laparotomy N/A 03/12/2015    Procedure: EXPLORATORY LAPAROTOMY;  Surgeon: Judeth Horn, MD;  Location: [place];  Service: General;  Laterality: N/A;  . Colostomy revision N/A 03/12/2015    Procedure:  RESECTION SIGMOID COLON;  Surgeon: Judeth Horn, MD;  Location: [place];  Service: General;  Laterality: N/A;  . Colostomy N/A 03/12/2015    Procedure: COLOSTOMY;  Surgeon: Judeth Horn, MD;  Location: [facility] OR;  Service: General;  Laterality: N/A;   Family History  Problem Relation Age of Onset  . Cancer Father     unknown type cancer    Social History  Substance Use Topics  . Smoking status: Former Smoker -- 0.50 packs/day for 25 years    Types: Cigarettes    Quit date: 02/07/1988  . Smokeless tobacco: None  . Alcohol Use: 0.0 oz/week    0 Standard drinks or equivalent per week     Comment: occasional     Review of Systems  Constitutional: Negative for fever and chills.  HENT: Negative for congestion and facial swelling.   Eyes: Negative  for discharge and visual disturbance.  Respiratory: Negative for shortness of breath.   Cardiovascular: Negative for chest pain and palpitations.  Gastrointestinal: Positive for abdominal pain and constipation. Negative for vomiting and diarrhea.  Musculoskeletal: Negative for myalgias and arthralgias.  Skin: Negative for color change and rash.  Neurological: Negative for tremors, syncope and headaches.  Psychiatric/Behavioral: Negative for confusion and dysphoric mood.      Allergies  Ciprofloxacin hcl and Ibuprofen  Home Medications   Prior to Admission medications   Medication Sig Start Date End Date Taking? Authorizing Provider  capecitabine (XELODA) 500 MG  tablet Take 4 tablets (2,000 mg total) by mouth 2 (two) times daily after a meal. 04/08/15  Yes Truitt Merle, MD  cephALEXin (KEFLEX) 250 MG capsule Take 1 capsule (250 mg total) by mouth 4 (four) times daily. 05/28/15  Yes Tatyana Kirichenko, PA-C  pravastatin (PRAVACHOL) 20 MG tablet Take 1 tablet (20 mg total) by mouth daily. Patient taking differently: Take 20 mg by mouth daily at 6 PM.  01/28/15  Yes Norman Herrlich, MD  traMADol (ULTRAM) 50 MG tablet Take 1 tablet (50 mg total) by mouth every 6 (six) hours as needed for moderate pain or severe pain. Patient not taking: Reported on 05/28/2015 03/19/15   Corky Sox, MD   BP 139/80 mmHg  Pulse 48  Temp(Src) 98 F (36.7 C) (Oral)  Resp 16  Ht 5\' 11"  (1.803 m)  Wt 184 lb (83.462 kg)  BMI 25.67 kg/m2  SpO2 100% Physical Exam  Constitutional: He is oriented to person, place, and time. He appears well-developed and well-nourished.  HENT:  Head: Normocephalic and atraumatic.  Eyes: EOM are normal. Pupils are equal, round, and reactive to light.  Neck: Normal range of motion. Neck supple. No JVD present.  Cardiovascular: Normal rate and regular rhythm.  Exam reveals no gallop and no friction rub.   No murmur heard. Pulmonary/Chest: No respiratory distress. He has no wheezes.  Abdominal: He exhibits no distension. There is no tenderness. There is no rebound and no guarding.  Musculoskeletal: Normal range of motion.  Neurological: He is alert and oriented to person, place, and time.  Skin: No rash noted. No pallor.  Psychiatric: He has a normal mood and affect. His behavior is normal.    ED Course  Procedures (including critical care time) Labs Review Labs Reviewed  CBC WITH DIFFERENTIAL/PLATELET - Abnormal; Notable for the following:    RBC 4.08 (*)    RDW 17.5 (*)    All other components within normal limits  COMPREHENSIVE METABOLIC PANEL - Abnormal; Notable for the following:    ALT 10 (*)    All other components within normal limits   I-STAT CHEM 8, ED - Abnormal; Notable for the following:    BUN 21 (*)    All other components within normal limits  LIPASE, BLOOD    Imaging Review No results found. I have personally reviewed and evaluated these images and lab results as part of my medical decision-making.   EKG Interpretation None      MDM   Final diagnoses:  Abdominal pain, unspecified abdominal location    74 yo M with a chief complaint of abdominal pain. Unsure what the etiology of these symptoms are. Will obtain a CT scan to evaluate for intra-abdominal pathology. CBC and CMP unremarkable. Lipase normal.  Awaiting CT results. Care transferred to Dr. Jim Desanctis, DO 06/10/15 [phone]

## 2015-06-09 NOTE — ED Notes (Addendum)
Pt reports abdominal pain since May when he had a colostomy placed. Pt states part of his bowel was removed because of a tumor. Pt denies n/v. Pt states today he noticed the urge to have a natural bowel movement. Pt wants to know why he is having this feeling and worried he might be constipated.

## 2015-06-09 NOTE — ED Notes (Signed)
Called CT to inform  Pt is ready for scan

## 2015-06-09 NOTE — Telephone Encounter (Signed)
Called pt yest & pt had already started xeloda.  Labs were OK for treatment.  Reminded to make sure that he waits on lab results before starting next time but was OK to continue xeloda as prescribed.

## 2015-06-09 NOTE — ED Notes (Signed)
Pt to CT

## 2015-06-10 MED ORDER — POLYETHYLENE GLYCOL 3350 17 GM/SCOOP PO POWD: 17.0000 g | Freq: Every day | ORAL | Status: DC

## 2015-06-10 NOTE — ED Provider Notes (Signed)
Dg Chest 2 View  05/28/2015   CLINICAL DATA:  74 year old male with a history of dizziness.  EXAM: CHEST - 2 VIEW  COMPARISON:  Chest x-ray 03/11/2015, 02/27/2015, chest CT 04/21/2015  FINDINGS: Cardiomediastinal silhouette unchanged in size and contour.  Lungs are well aerated. No confluent airspace disease. No pneumothorax or pleural effusion.  Coarsened interstitial markings at the bilateral lung bases.  No displaced fracture.  Degenerative changes of the right acromioclavicular joint.  Unremarkable appearance of the upper abdomen.  IMPRESSION: Coarsened interstitial markings at the bilateral lung bases, likely chronic, though superimposed acute bronchitis or atypical infection difficult to exclude. No evidence of lobar pneumonia or pleural effusion.  Degenerative changes of the right acromioclavicular joint again noted.  Signed,  Dulcy Fanny. Earleen Newport, DO  [facility]  [facility]   Electronically Signed   By: Corrie Mckusick D.O.   On: 05/28/2015 13:57   Ct Abdomen Pelvis W Contrast  06/10/2015   CLINICAL DATA:  Sharp abdominal pain.  History of colostomy.  EXAM: CT ABDOMEN AND PELVIS WITH CONTRAST  TECHNIQUE: Multidetector CT imaging of the abdomen and pelvis was performed using the standard protocol following bolus administration of intravenous contrast.  CONTRAST:  167mL OMNIPAQUE IOHEXOL 300 MG/ML  SOLN  COMPARISON:  03/11/2015  FINDINGS: BODY WALL: No contributory findings.  LOWER CHEST: No contributory findings.  ABDOMEN/PELVIS:  Liver: 3 low densities in the upper liver, up to 4 mm on image 9, are stable and remain too small to characterize.  Biliary: No evidence of biliary obstruction or stone.  Pancreas: Unremarkable.  Spleen: Unremarkable.  Adrenals: Unremarkable.  Kidneys and ureters: No hydronephrosis or stone.  Bladder: Chronically dilated and flaccid appearing bladder in this patient with history of bladder atony.  Reproductive: Enlarged prostate with  previous transurethral resection.  Bowel: Prominent colonic gas and stool throughout the colon above the ostomy. Although the ostomy is decompressed, there is no discrete transition point to suggest stricture. The excluded distal colon contains gas and fluid. No inflammatory bowel wall thickening. No small bowel dilatation. No appendicitis.  Retroperitoneum: No mass or adenopathy.  Peritoneum: There is a 9 mm nodular density in the low pelvic recess, contiguous with the rectal wall. A diverticulum was not seen in this location previously.  Vascular: No acute abnormality.  OSSEOUS: No acute abnormalities. Diffuse degenerative disc disease. No suspicious lytic or blastic lesion noted.  IMPRESSION: 1. Extensive colonic stool and gas, correlate for constipation. The descending colostomy is unremarkable. 2. 9 mm nodular structure the along the ventral rectum which was not seen 03/11/2015. This could reflect a peritoneal nodule and surveillance imaging recommended to exclude early peritoneal tumor. 3. Chronic findings are stable from prior and noted above.   Electronically Signed   By: Monte Fantasia M.D.   On: 06/10/2015 00:28   I personally reviewed the imaging tests through PACS system I reviewed available ER/hospitalization records through the EMR   Will dc home with miralax for constipation. Told to increase oral hydration at home  Jola Schmidt, MD 06/10/15 0041

## 2015-06-23 ENCOUNTER — Telehealth: Payer: Self-pay | Admitting: Hematology

## 2015-06-23 ENCOUNTER — Other Ambulatory Visit (HOSPITAL_BASED_OUTPATIENT_CLINIC_OR_DEPARTMENT_OTHER): Payer: Medicare Other

## 2015-06-23 ENCOUNTER — Ambulatory Visit (HOSPITAL_BASED_OUTPATIENT_CLINIC_OR_DEPARTMENT_OTHER): Payer: Medicare Other | Admitting: Hematology

## 2015-06-23 ENCOUNTER — Encounter: Payer: Self-pay | Admitting: Nutrition

## 2015-06-23 VITALS — BP 128/86 | HR 50 | Temp 98.4°F | Resp 16 | Ht 71.0 in | Wt 183.1 lb

## 2015-06-23 DIAGNOSIS — C189 Malignant neoplasm of colon, unspecified: Secondary | ICD-10-CM

## 2015-06-23 DIAGNOSIS — C187 Malignant neoplasm of sigmoid colon: Secondary | ICD-10-CM

## 2015-06-23 LAB — COMPREHENSIVE METABOLIC PANEL (CC13)
ALBUMIN: 3.8 g/dL (ref 3.5–5.0)
ALK PHOS: 63 U/L (ref 40–150)
ALT: 6 U/L (ref 0–55)
AST: 14 U/L (ref 5–34)
Anion Gap: 5 mEq/L (ref 3–11)
BILIRUBIN TOTAL: 0.82 mg/dL (ref 0.20–1.20)
BUN: 17.8 mg/dL (ref 7.0–26.0)
CALCIUM: 9.1 mg/dL (ref 8.4–10.4)
CO2: 30 mEq/L — ABNORMAL HIGH (ref 22–29)
Chloride: 106 mEq/L (ref 98–109)
Creatinine: 1 mg/dL (ref 0.7–1.3)
EGFR: 88 mL/min/{1.73_m2} — AB (ref >90)
Glucose: 96 mg/dl (ref 70–140)
POTASSIUM: 4.3 meq/L (ref 3.5–5.1)
Sodium: 140 mEq/L (ref 136–145)
Total Protein: 7 g/dL (ref 6.4–8.3)

## 2015-06-23 LAB — CBC WITH DIFFERENTIAL/PLATELET
BASO%: 0.9 % (ref 0.0–2.0)
BASOS ABS: 0.1 10*3/uL (ref 0.0–0.1)
EOS ABS: 0.4 10*3/uL (ref 0.0–0.5)
EOS%: 6.9 % (ref 0.0–7.0)
HEMATOCRIT: 38.8 % (ref 38.4–49.9)
HEMOGLOBIN: 13.2 g/dL (ref 13.0–17.1)
LYMPH#: 1.7 10*3/uL (ref 0.9–3.3)
LYMPH%: 30.6 % (ref 14.0–49.0)
MCH: 33.2 pg (ref 27.2–33.4)
MCHC: 34 g/dL (ref 32.0–36.0)
MCV: 97.7 fL (ref 79.3–98.0)
MONO#: 0.5 10*3/uL (ref 0.1–0.9)
MONO%: 8.3 % (ref 0.0–14.0)
NEUT#: 3 10*3/uL (ref 1.5–6.5)
NEUT%: 53.3 % (ref 39.0–75.0)
Platelets: 220 10*3/uL (ref 140–400)
RBC: 3.97 10*6/uL — ABNORMAL LOW (ref 4.20–5.82)
RDW: 17.2 % — ABNORMAL HIGH (ref 11.0–14.6)
WBC: 5.7 10*3/uL (ref 4.0–10.3)
nRBC: 0 % (ref 0–0)

## 2015-06-23 MED ORDER — CAPECITABINE 500 MG PO TABS: 1000.0000 mg/m2 | ORAL_TABLET | Freq: Two times a day (BID) | ORAL | Status: DC

## 2015-06-23 NOTE — Telephone Encounter (Signed)
per pof to sch pt appt-gave pt copy of avs °

## 2015-06-23 NOTE — Progress Notes (Signed)
Greg [name]  [phone] Fax:([phone]) [place] Note   Patient Care Team: Greg Herrlich, MD as PCP - General Greg Horn, MD as Consulting Physician (General Surgery) Greg Merle, MD as Consulting Physician (Hematology) 06/23/2015  CHIEF COMPLAINTS:  Follow up stage III colon cancer   Oncology History   Colon adenocarcinoma   Staging form: Colon and Rectum, AJCC 7th Edition     Pathologic stage from 03/12/2015: Stage IIIB (T4a, N1c, cM0) - Signed by Greg Merle, MD on 04/05/2015        Colon adenocarcinoma   02/27/2015 Imaging CT abdomen and pelvis showed an annular constricting lesion within the sigmoid colon, no significant adenopathy or distant metastasis. CT abdomen and pelvis on 03/11/2015 showed similar findings with obstruction.   03/12/2015 Initial Diagnosis Sigmoid colon adenocarcinoma   03/12/2015 Tumor Marker CEA normal 2.5   03/12/2015 Surgery Sigmoid colon segmental resection by Greg. Excell [name], surgical margins were negative.   03/12/2015 Pathology Results Well differentiated invasive adenocarcinoma with associated abundant mucin, 14 lymph nodes were negative, a tumor deposit was present, surgical margins were negative. pT4N1c   04/26/2015 -  Chemotherapy Capecitabine po 2065m q12h, 2 weeks on, one week off, as adjuvant chemo     HISTORY OF PRESENTING ILLNESS:  Greg [name] 73y.o. male is here because of stage III colon cancer. He was referred after his recent hospitalization.   He has been having abdominal discomfort and nausea for the past few months, along with some blood in his stool. No significant weight loss or change of his appetite. He presented to the ED 02/27/15 for abdominal pain with x-ray showing possible obstruction but CT scan negative for obstruction or ileus but it did indicate an annular constricting lesion in the sigmoid. He was seen by GI Greg OPaulita Fujitaper colonoscopy failed due to the poor prep.  He presented to ED  again on 03/12/2015. CT abd/pelvis showed annular constricting lesion within the sigmoid colon with markedly dilated colon proximal to this lesion with air-fluid levels, compatible with distal colonic obstructing lesion concerning for malignancy as well as tiny hypodensities scattered throughout the liver, nonspecific but favor small cysts. General surgery was called and performed sigmoid colectomy with colostomy on 03/12/2015 by Greg [name] The patient's post-operative course was complicated only by hypokalemia which was repeatedly replenished with IV and oral KCl. He received IV hydration and pain management and his diet was slowly advanced as tolerated. Physical and occupational therapy saw the patient and made recommendations. He was discharged to home with home health PT and ostomy care on 03/19/2015.   He has been recovering well from the surgery. He has no significant pain at the surgical site, no nausea, normal stool output in the colostomy bag. He has a mild to moderate fatigue, able to take care of himself and tolerating daily activities. He lives alone, uses walker as needed. He feels his energy level has been recovered well. He has a brother who lives for 5 blocks away, and helps him out when he needs.  CURRENT THERAPY: adjuvant Xeloda 2000 mg every 12 hours, 2 weeks on, one-week off, started on 04/26/2015  INTERIM HISTORY: He returns for follow up. He completed cycle 3 Xeloda last week, and is off this week. He is doing well overall. He has mild fatigue, but no other side effects from Xeloda. He has good appetite and eats well, weight is stable. (+) skin and nail pigmentation on hands, but no neuropathy or skin  erythema.     MEDICAL HISTORY:  Past Medical History  Diagnosis Date  . Hyperlipidemia   . Hypertension   . Epididymitis     right  . Atony of bladder     followed at [facility]; CIC 3x/day, has been treated for culture proven serratia marcesens 05/2011, 06/2011, 07/2011,  09/2011  . [name] (benign prostatic hyperplasia)     hyposensitive bladder, nodular prostate with increased PSA (10.23 in 03/2006), s/p prostate biopsy 2006 that was normal (Greg Greg [name]) he recommended TURP.  . Sinus bradycardia   . Chronic knee pain     left  . Gingivitis   . Right shoulder pain     sx in 06/2005  . Frozen shoulder     left frozen shoulder  . Shingles   . Cancer     SURGICAL HISTORY: Past Surgical History  Procedure Laterality Date  . Vasectomy    . Hydrocelectomy  11/14/2010    left hydrocelectomy  . Right shoulder arthroscopic surgery  06/2005  . Transurethral resection of prostate      status post TURP 2/2 [name] in 2008  . Knee surgery      L knee  . Laparotomy N/A 03/12/2015    Procedure: EXPLORATORY LAPAROTOMY;  Surgeon: Greg Horn, MD;  Location: [place];  Service: General;  Laterality: N/A;  . Colostomy revision N/A 03/12/2015    Procedure:  RESECTION SIGMOID COLON;  Surgeon: Greg Horn, MD;  Location: [place];  Service: General;  Laterality: N/A;  . Colostomy N/A 03/12/2015    Procedure: COLOSTOMY;  Surgeon: Greg Horn, MD;  Location: [place];  Service: General;  Laterality: N/A;    SOCIAL HISTORY: Social History   Social History  . Marital Status: Single    Spouse Name: N/A  . Number of Children: N/A  . Years of Education: 12   Occupational History  .      retired   Social History Main Topics  . Smoking status: Former Smoker -- 0.50 packs/day for 25 years    Types: Cigarettes    Quit date: 02/07/1988  . Smokeless tobacco: Not on file  . Alcohol Use: 0.0 oz/week    0 Standard drinks or equivalent per week     Comment: occasional   . Drug Use: No  . Sexual Activity: Not on file   Other Topics Concern  . Not on file   Social History Narrative   Single, lives alone in apartment   No children   Several local siblings in area-will take him to grocery store, etc   Rides bus (75cents/trip) and bus is right outside his house   Prior employment  recycle plant   Reports strong faith helps him cope with life   Has had to self-cath twice daily since 2008 -Greg. Jeffie [name]   [facility] for ostomy care and supplies   Ambulates w/walker-independent in [employer]L's-cooks pot pies and frozen dinners    FAMILY HISTORY: Family History  Problem Relation Age of Onset  . Cancer Father     unknown type cancer     ALLERGIES:  is allergic to ciprofloxacin hcl and ibuprofen.  MEDICATIONS:  Current Outpatient Prescriptions  Medication Sig Dispense Refill  . capecitabine (XELODA) 500 MG tablet Take 4 tablets (2,000 mg total) by mouth 2 (two) times daily after a meal. 112 tablet 2  . pravastatin (PRAVACHOL) 20 MG tablet Take 1 tablet (20 mg total) by mouth daily. (Patient taking differently: Take 20 mg by mouth daily  at 6 PM. ) 90 tablet 4  . traMADol (ULTRAM) 50 MG tablet Take 1 tablet (50 mg total) by mouth every 6 (six) hours as needed for moderate pain or severe pain. 30 tablet 0  . polyethylene glycol powder (MIRALAX) powder Take 17 g by mouth daily. (Patient not taking: Reported on 06/23/2015) 255 g 0   No current facility-administered medications for this visit.    REVIEW OF SYSTEMS:   Constitutional: Denies fevers, chills or abnormal night sweats Eyes: Denies blurriness of vision, double vision or watery eyes Ears, nose, mouth, throat, and face: Denies mucositis or sore throat Respiratory: Denies cough, dyspnea or wheezes Cardiovascular: Denies palpitation, chest discomfort or lower extremity swelling Gastrointestinal:  Denies nausea, heartburn or change in bowel habits Skin: Denies abnormal skin rashes Lymphatics: Denies new lymphadenopathy or easy bruising Neurological:Denies numbness, tingling or new weaknesses Behavioral/Psych: Mood is stable, no new changes  All other systems were reviewed with the patient and are negative.  PHYSICAL EXAMINATION: ECOG PERFORMANCE STATUS: 1 - Symptomatic but completely ambulatory  Filed Vitals:    06/23/15 1253  BP: 128/86  Pulse: 50  Temp: 98.4 F (36.9 C)  Resp: 16   Filed Weights   06/23/15 1253  Weight: 183 lb 1.6 oz (83.054 kg)    GENERAL:alert, no distress and comfortable SKIN: skin color, texture, turgor are normal, no rashes or significant lesions EYES: normal, conjunctiva are pink and non-injected, sclera clear OROPHARYNX:no exudate, no erythema and lips, buccal mucosa, and tongue normal  NECK: supple, thyroid normal size, non-tender, without nodularity LYMPH:  no palpable lymphadenopathy in the cervical, axillary or inguinal LUNGS: clear to auscultation and percussion with normal breathing effort HEART: regular rate & rhythm and no murmurs and no lower extremity edema ABDOMEN:abdomen soft, non-tender and normal bowel sounds, surgical incision is well healed, (+) colostomy bag in the left abdomen. Musculoskeletal:no cyanosis of digits and no clubbing  PSYCH: alert & oriented x 3 with fluent speech NEURO: no focal motor/sensory deficits  LABORATORY DATA:  I have reviewed the data as listed  CBC Latest Ref Rng 06/23/2015 06/09/2015 06/09/2015  WBC 4.0 - 10.3 10e3/uL 5.7 - 5.4  Hemoglobin 13.0 - 17.1 g/dL 13.2 15.0 13.6  Hematocrit 38.4 - 49.9 % 38.8 44.0 39.9  Platelets 140 - 400 10e3/uL 220 - 252    CMP Latest Ref Rng 06/23/2015 06/09/2015 06/09/2015  Glucose 70 - 140 mg/dl 96 91 93  BUN 7.0 - 26.0 mg/dL 17.8 21(H) 16  Creatinine 0.7 - 1.3 mg/dL 1.0 1.00 1.02  Sodium 136 - 145 mEq/L 140 143 141  Potassium 3.5 - 5.1 mEq/L 4.3 3.9 4.0  Chloride 101 - 111 mmol/L - 103 104  CO2 22 - 29 mEq/L 30(H) - 31  Calcium 8.4 - 10.4 mg/dL 9.1 - 9.0  Total Protein 6.4 - 8.3 g/dL 7.0 - 7.0  Total Bilirubin 0.20 - 1.20 mg/dL 0.82 - 0.7  Alkaline Phos 40 - 150 U/L 63 - 67  AST 5 - 34 U/L 14 - 16  ALT 0 - 55 U/L 6 - 10(L)      CEA  Status: Finalresult Visible to patient:  Not Released Nextappt: Today at 11:00 AM in Oncology ([place])           Ref Range 3wk ago    CEA 0.0 - 4.7 ng/mL 2.5         PATHOLOGY REPORT  Diagnosis 03/12/2015 Colon, segmental resection for tumor, sigmoid colon - INVASIVE WELL-DIFFERENTIATED ADENOCARCINOMA WITH  ASSOCIATED ABUNDANT MUCIN, SPANNING 7 CM IN GREATEST DIMENSION. 1 of 4 - TUMOR INVADES THROUGH MUSCULARIS PROPRIA THROUGH PERICOLORECTAL SOFT TISSUES TO INVOLVE SEROSAL SURFACE. - MARGINS ARE NEGATIVE. - FOURTEEN BENIGN LYMPH NODES WITH NO TUMOR SEEN (0/14). - SINGLE SOFT TISSUE SATELLITE  Microscopic Comment COLON AND RECTUM: Specimen: Sigmoid colon. Procedure: Resection of sigmoid colon. Tumor site: Sigmoid colon, distal aspect of specimen. Specimen integrity: Intact.  Macroscopic tumor perforation: Not identified. Invasive tumor: Maximum size: 7 cm. Histologic type(s): Invasive adenocarcinoma with abundant extracellular mucin. Histologic grade and differentiation: G1: well differentiated/low grade Type of polyp in which invasive carcinoma arose: Precursor polyp is not identified. Microscopic extension of invasive tumor: Tumor invades through muscularis propria through pericolorectal soft tissues to involve serosal surface. Lymph-Vascular invasion: Definitive lymph/vascular invasion is not identified; however, a tumor deposit is present, see below. Peri-neural invasion: Not identified. Tumor deposit(s) (discontinuous extramural extension): Yes, tumor deposit present. Resection margins: Proximal margin: Negative. Distal margin: Negative. Mesenteric margin (sigmoid and transverse): Negative. Distance closest margin (if all above margins negative): 3.5 cm (mesenteric margin). Treatment effect (neo-adjuvant therapy): Not applicable. Additional polyp(s): No additional polyps identified. Non-neoplastic findings: No significant non-neoplastic findings. Lymph nodes: number examined 14; number positive: 0. Pathologic Staging: pT4a, pN1c. Ancillary studies: Per colorectal cancer  protocol, MMR by IHC and MSI by PCR will be performed on the current tumor. (RH:ecj 03/16/2015)  ADDITIONAL INFORMATION: Mismatch Repair (MMR) Protein Immunohistochemistry (IHC) IHC Expression Result: MLH1: Preserved nuclear expression (greater 50% tumor expression) MSH2: Preserved nuclear expression (greater 50% tumor expression) MSH6: Preserved nuclear expression (greater 50% tumor expression) PMS2: Preserved nuclear expression (greater 50% tumor expression) * Internal control demonstrates intact nuclear expression Interpretation: NORMAL    RADIOGRAPHIC STUDIES: I have personally reviewed the radiological images as listed and agreed with the findings in the report.  Ct Abdomen Pelvis W Contrast 03/11/2015    FINDINGS: Lower chest: Linear subsegmental atelectasis in the lung bases. No effusions. Heart is upper limits normal in size.  Hepatobiliary: Scattered small nonspecific hypodensities throughout the liver, stable. Gallbladder unremarkable. No biliary ductal dilatation.  Pancreas: No focal abnormality or ductal dilatation.  Spleen: No focal abnormality.  Normal size.  Adrenals/Urinary Tract: No focal renal or adrenal abnormality. No hydronephrosis. Urinary bladder is unremarkable.  Stomach/Bowel: The colon is dilated and fluid-filled with air-fluid levels. Stomach and small bowel are decompressed. The colon is dilated to an annular constricting lesion within the sigmoid colon.  Vascular/Lymphatic: No retroperitoneal or mesenteric adenopathy. Aorta normal caliber.  Reproductive: No mass or other significant abnormality.  Other: No free fluid or free air.  Musculoskeletal: Degenerative changes in the lumbar spine. No acute findings.   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 Findings compatible with distal colonic obstructing lesion concerning for malignancy.  Tiny hypodensities scattered  throughout the liver, nonspecific, favor small cysts.   Electronically Signed   By: Rolm Baptise M.D.   On: 03/11/2015 21:17    ASSESSMENT & PLAN: 74 year old African-American male  1. Sigmoid colon cancer, pT4N1cM0, stage IIIB, grade 1, MMR normal -I reviewed his CT abdomen and pelvis findings, and his surgical pathology results in great details with the patient. -Giving the advanced stage IIIB disease, he doesn't have moderate to high risk of cancer recurrence after surgery. -We discussed the role of adjuvant chemotherapy to reduce the risk of cancer recurrence, the standard is 5-FU or capecitabine along with oxaliplatin. Although he is 6, his  general health and performance status is relatively good, I think he would be a candidate for FOLFOX. The benefits and risks of chemotherapy were discussed with patient, he agrees to proceed. Alternatively we can consider 5-FU or capecitabine alone, if he has difficulty tolerating FOLFOX. -He lateron changed his mind to single agent Xeloda. He is on cycle 3 now, tolerating well. We'll continue, total of 6 months treatment -Lab reviewed, no significant cytopenia. He only has mild skin pigmentation,or significant toxicity. -start cycle 4 next Monday 9/5  2. Atony of bladder -He does straight cath twice a day, and on chronic Bactrim to prevent UTI. -He'll continue follow-up with his urologist. -We discussed the increased risk of UTI and other infection with chemotherapy  3. Hypertension, arthritis -He will continue follow-up with his primary care physician  Plan -start cycle 4 Xeloda next Monday 9/5 -RTC in 3 weeks before cycle 5   All questions were answered. The patient knows to call the clinic with any problems, questions or concerns. I spent 20 minutes counseling the patient face to face. The total time spent in the appointment was 25 minutes and more than 50% was on counseling.     Greg Merle, MD 06/23/2015 1:39 PM

## 2015-06-23 NOTE — Progress Notes (Signed)
Provided oral nutrition supplements to patient at MD visit.

## 2015-06-27 ENCOUNTER — Encounter: Payer: Self-pay | Admitting: Hematology

## 2015-07-07 ENCOUNTER — Encounter: Payer: Self-pay | Admitting: Internal Medicine

## 2015-07-07 ENCOUNTER — Ambulatory Visit (INDEPENDENT_AMBULATORY_CARE_PROVIDER_SITE_OTHER): Payer: Medicare Other | Admitting: Internal Medicine

## 2015-07-07 VITALS — BP 122/70 | HR 52 | Temp 97.5°F | Ht 71.0 in | Wt 184.3 lb

## 2015-07-07 DIAGNOSIS — J309 Allergic rhinitis, unspecified: Secondary | ICD-10-CM | POA: Diagnosis not present

## 2015-07-07 DIAGNOSIS — E785 Hyperlipidemia, unspecified: Secondary | ICD-10-CM | POA: Diagnosis not present

## 2015-07-07 DIAGNOSIS — Z23 Encounter for immunization: Secondary | ICD-10-CM

## 2015-07-07 DIAGNOSIS — I10 Essential (primary) hypertension: Secondary | ICD-10-CM

## 2015-07-07 DIAGNOSIS — Z0000 Encounter for general adult medical examination without abnormal findings: Secondary | ICD-10-CM

## 2015-07-07 MED ORDER — PRAVASTATIN SODIUM 20 MG PO TABS: 20.0000 mg | ORAL_TABLET | Freq: Every day | ORAL | Status: DC

## 2015-07-07 MED ORDER — FLUTICASONE PROPIONATE 50 MCG/ACT NA SUSP: 1.0000 | Freq: Every day | NASAL | Status: DC

## 2015-07-07 NOTE — Patient Instructions (Signed)
You can use flonase 1 spray into each nostril for your runny nose.

## 2015-07-07 NOTE — Assessment & Plan Note (Signed)
BP well controlled, not on any meds for BP since colectomy.

## 2015-07-07 NOTE — Progress Notes (Signed)
Internal Medicine Clinic Attending  Case discussed with Dr. Truong at the time of the visit.  We reviewed the resident's history and exam and pertinent patient test results.  I agree with the assessment, diagnosis, and plan of care documented in the resident's note.  

## 2015-07-07 NOTE — Assessment & Plan Note (Signed)
Refilled pravastatin 20 mg daily. 

## 2015-07-07 NOTE — Assessment & Plan Note (Signed)
Getting prevnar 13 and flu shot today.

## 2015-07-07 NOTE — Progress Notes (Signed)
   Subjective:    Patient ID: Greg [name], male    DOB: October 19, 1941, 74 y.o.   MRN: [id number]  HPI 74 y/o M w/PMHx of HTN, sinus bradycardia, colon adenocarcinoma stage IIIB, and atonic bladder who presents to clinic for med refills and runny nose x 1 week. Problems list reviewed, med rec done, and health maintenance addressed. Please see problem list for further details.      Review of Systems  Constitutional: Negative for fever and chills.  HENT: Positive for rhinorrhea. Negative for congestion, sneezing and sore throat.   Eyes:       Watery eyes on occasion   Respiratory: Negative for shortness of breath.   Cardiovascular: Negative for chest pain.  Gastrointestinal: Negative for abdominal pain.  Genitourinary: Positive for difficulty urinating (self caths ). Negative for dysuria and hematuria.  Musculoskeletal: Negative for myalgias and neck pain.  Neurological: Negative for dizziness and light-headedness.  Psychiatric/Behavioral: Negative for dysphoric mood.       Objective:   Physical Exam  Constitutional: He appears well-developed and well-nourished.  HENT:  Head: Normocephalic.  Mouth/Throat: Oropharynx is clear and moist.  Cardiovascular: Regular rhythm and normal heart sounds.  Bradycardia present.   No murmur heard. Pulmonary/Chest: Effort normal and breath sounds normal. No respiratory distress. He has no wheezes. He has no rales.  Abdominal: Soft. Bowel sounds are normal.  Colostomy bag intact, stoma pink w/o signs of infection   Neurological: He is alert.  Skin: Skin is warm and dry.  Psychiatric: He has a normal mood and affect. His behavior is normal.          Assessment & Plan:  Please see problem based assessment and plan.

## 2015-07-07 NOTE — Addendum Note (Signed)
Addended by: Sander Nephew F on: 07/07/2015 03:30 PM   Modules accepted: Orders

## 2015-07-11 ENCOUNTER — Telehealth: Payer: Self-pay | Admitting: *Deleted

## 2015-07-11 NOTE — Telephone Encounter (Signed)
Pt called and left message wanting to talk to nurse about diarrhea.  Spoke with pt and was informed that pt had approx. 3 - 4 episodes of loose mushy stools yesterday.  Pt had no diarrhea today.   Pt is still taking  Xeloda  2000 mg  Twice  Daily.   Instructed pt re:  If pt develops diarrhea today, pt can take 2 tablets of Imodium, and 1 tablet thereafter for each loose stools - but no more than 8 tablets per day.  Instructed pt to call office back after 2 doses of Imodium and diarrhea not resolved.  Instructed pt also to push po fluids as tolerated.  Pt voiced understanding. Pt's  Phone   [phone].

## 2015-07-12 DIAGNOSIS — R300 Dysuria: Secondary | ICD-10-CM | POA: Diagnosis not present

## 2015-07-14 ENCOUNTER — Other Ambulatory Visit (HOSPITAL_BASED_OUTPATIENT_CLINIC_OR_DEPARTMENT_OTHER): Payer: Medicare Other

## 2015-07-14 ENCOUNTER — Ambulatory Visit (HOSPITAL_BASED_OUTPATIENT_CLINIC_OR_DEPARTMENT_OTHER): Payer: Medicare Other | Admitting: Hematology

## 2015-07-14 ENCOUNTER — Encounter: Payer: Self-pay | Admitting: Hematology

## 2015-07-14 ENCOUNTER — Telehealth: Payer: Self-pay | Admitting: Hematology

## 2015-07-14 VITALS — BP 147/79 | HR 52 | Temp 97.7°F | Resp 16 | Ht 71.0 in | Wt 182.2 lb

## 2015-07-14 DIAGNOSIS — C189 Malignant neoplasm of colon, unspecified: Secondary | ICD-10-CM

## 2015-07-14 DIAGNOSIS — C187 Malignant neoplasm of sigmoid colon: Secondary | ICD-10-CM | POA: Diagnosis present

## 2015-07-14 LAB — CBC WITH DIFFERENTIAL/PLATELET
BASO%: 0.9 % (ref 0.0–2.0)
Basophils Absolute: 0.1 10*3/uL (ref 0.0–0.1)
EOS%: 4.6 % (ref 0.0–7.0)
Eosinophils Absolute: 0.3 10*3/uL (ref 0.0–0.5)
HEMATOCRIT: 40.2 % (ref 38.4–49.9)
HGB: 13.3 g/dL (ref 13.0–17.1)
LYMPH%: 20.5 % (ref 14.0–49.0)
MCH: 33.4 pg (ref 27.2–33.4)
MCHC: 33 g/dL (ref 32.0–36.0)
MCV: 101.2 fL — ABNORMAL HIGH (ref 79.3–98.0)
MONO#: 0.8 10*3/uL (ref 0.1–0.9)
MONO%: 10.8 % (ref 0.0–14.0)
NEUT#: 4.5 10*3/uL (ref 1.5–6.5)
NEUT%: 63.2 % (ref 39.0–75.0)
PLATELETS: 238 10*3/uL (ref 140–400)
RBC: 3.97 10*6/uL — ABNORMAL LOW (ref 4.20–5.82)
RDW: 19.8 % — ABNORMAL HIGH (ref 11.0–14.6)
WBC: 7.1 10*3/uL (ref 4.0–10.3)
lymph#: 1.5 10*3/uL (ref 0.9–3.3)

## 2015-07-14 LAB — COMPREHENSIVE METABOLIC PANEL (CC13)
ALBUMIN: 3.5 g/dL (ref 3.5–5.0)
ALK PHOS: 66 U/L (ref 40–150)
ANION GAP: 6 meq/L (ref 3–11)
AST: 13 U/L (ref 5–34)
BILIRUBIN TOTAL: 0.71 mg/dL (ref 0.20–1.20)
BUN: 20.7 mg/dL (ref 7.0–26.0)
CALCIUM: 8.3 mg/dL — AB (ref 8.4–10.4)
CO2: 27 mEq/L (ref 22–29)
CREATININE: 1 mg/dL (ref 0.7–1.3)
Chloride: 105 mEq/L (ref 98–109)
EGFR: 86 mL/min/{1.73_m2} — ABNORMAL LOW (ref >90)
Glucose: 99 mg/dl (ref 70–140)
Potassium: 4 mEq/L (ref 3.5–5.1)
Sodium: 138 mEq/L (ref 136–145)
Total Protein: 6.6 g/dL (ref 6.4–8.3)

## 2015-07-14 MED ORDER — GABAPENTIN 100 MG PO CAPS: 100.0000 mg | ORAL_CAPSULE | Freq: Every day | ORAL | Status: DC

## 2015-07-14 NOTE — Progress Notes (Signed)
[place]  [phone] [phone]  Clinic Follow up Note   Patient Care Team: Norman Herrlich, MD as PCP - General Judeth Horn, MD as Consulting Physician (General Surgery) Truitt Merle, MD as Consulting Physician (Hematology) 07/14/2015  CHIEF COMPLAINTS:  Follow up stage III colon cancer   Oncology History   Colon adenocarcinoma   Staging form: Colon and Rectum, AJCC 7th Edition     Pathologic stage from 03/12/2015: Stage IIIB (T4a, N1c, cM0) - Signed by Truitt Merle, MD on 04/05/2015        Colon adenocarcinoma   02/27/2015 Imaging CT abdomen and pelvis showed an annular constricting lesion within the sigmoid colon, no significant adenopathy or distant metastasis. CT abdomen and pelvis on 03/11/2015 showed similar findings with obstruction.   03/12/2015 Initial Diagnosis Sigmoid colon adenocarcinoma   03/12/2015 Tumor Marker CEA normal 2.5   03/12/2015 Surgery Sigmoid colon segmental resection by Dr. Excell Seltzer, surgical margins were negative.   03/12/2015 Pathology Results Well differentiated invasive adenocarcinoma with associated abundant mucin, 14 lymph nodes were negative, a tumor deposit was present, surgical margins were negative. pT4N1c   04/26/2015 -  Chemotherapy Capecitabine po 2052m q12h, 2 weeks on, one week off, as adjuvant chemo     HISTORY OF PRESENTING ILLNESS:  Greg [name] 74y.o. male is here because of stage III colon cancer. He was referred after his recent hospitalization.   He has been having abdominal discomfort and nausea for the past few months, along with some blood in his stool. No significant weight loss or change of his appetite. He presented to the ED 02/27/15 for abdominal pain with x-ray showing possible obstruction but CT scan negative for obstruction or ileus but it did indicate an annular constricting lesion in the sigmoid. He was seen by GI Dr OPaulita Fujitaper colonoscopy failed due to the poor prep.  He presented to ED again  on 03/12/2015. CT abd/pelvis showed annular constricting lesion within the sigmoid colon with markedly dilated colon proximal to this lesion with air-fluid levels, compatible with distal colonic obstructing lesion concerning for malignancy as well as tiny hypodensities scattered throughout the liver, nonspecific but favor small cysts. General surgery was called and performed sigmoid colectomy with colostomy on 03/12/2015 by Dr. WHulen Skains The patient's post-operative course was complicated only by hypokalemia which was repeatedly replenished with IV and oral KCl. He received IV hydration and pain management and his diet was slowly advanced as tolerated. Physical and occupational therapy saw the patient and made recommendations. He was discharged to home with home health PT and ostomy care on 03/19/2015.   He has been recovering well from the surgery. He has no significant pain at the surgical site, no nausea, normal stool output in the colostomy bag. He has a mild to moderate fatigue, able to take care of himself and tolerating daily activities. He lives alone, uses walker as needed. He feels his energy level has been recovered well. He has a brother who lives for 5 blocks away, and helps him out when he needs.  CURRENT THERAPY: adjuvant Xeloda 2000 mg every 12 hours, 2 weeks on, one-week off, started on 04/26/2015  INTERIM HISTORY: He returns for follow up. He is finishing the cycle 4 Xeloda in a few days. He developed diarrhea 3-4 days ago, with loose BM 3-4 times a day, no abdominal pain, nausea, or fever. [place] use [place]. His diarrhea did improved after taking imodium, and he had no  BM today yet. He also reports some numbness and tingling on fingers since a few weeks ago, mild to moderate, wakes him up sometime at night,  his hand function is fine. He otherwise doing well, tolerating Xarelto well, good appetite and eating well, no other new complaints. He received a flu shot and  pneumonia shot a few days ago.  MEDICAL HISTORY:  Past Medical History  Diagnosis Date  . Hyperlipidemia   . Hypertension   . Epididymitis     right  . Atony of bladder     followed at [facility]; CIC 3x/day, has been treated for culture proven serratia marcesens 05/2011, 06/2011, 07/2011, 09/2011  . [name] (benign prostatic hyperplasia)     hyposensitive bladder, nodular prostate with increased PSA (10.23 in 03/2006), s/p prostate biopsy 2006 that was normal (Dr Jeffie Pollock) he recommended TURP.  . Sinus bradycardia   . Chronic knee pain     left  . Gingivitis   . Right shoulder pain     sx in 06/2005  . Frozen shoulder     left frozen shoulder  . Shingles   . Cancer     SURGICAL HISTORY: Past Surgical History  Procedure Laterality Date  . Vasectomy    . Hydrocelectomy  11/14/2010    left hydrocelectomy  . Right shoulder arthroscopic surgery  06/2005  . Transurethral resection of prostate      status post TURP 2/2 [name] in 2008  . Knee surgery      L knee  . Laparotomy N/A 03/12/2015    Procedure: EXPLORATORY LAPAROTOMY;  Surgeon: Judeth Horn, MD;  Location: [place];  Service: General;  Laterality: N/A;  . Colostomy revision N/A 03/12/2015    Procedure:  RESECTION SIGMOID COLON;  Surgeon: Judeth Horn, MD;  Location: [place];  Service: General;  Laterality: N/A;  . Colostomy N/A 03/12/2015    Procedure: COLOSTOMY;  Surgeon: Judeth Horn, MD;  Location: [facility];  Service: General;  Laterality: N/A;    SOCIAL HISTORY: Social History   Social History  . Marital Status: Single    Spouse Name: N/A  . Number of Children: N/A  . Years of Education: 12   Occupational History  .      retired   Social History Main Topics  . Smoking status: Former Smoker -- 0.50 packs/day for 25 years    Types: Cigarettes    Quit date: 02/07/1988  . Smokeless tobacco: Not on file  . Alcohol Use: 0.0 oz/week    0 Standard drinks or equivalent per week     Comment: occasional   . Drug Use: No  .  Sexual Activity: Not on file   Other Topics Concern  . Not on file   Social History Narrative   Single, lives alone in apartment   No children   Several local siblings in area-will take him to grocery store, etc   Rides bus (75cents/trip) and bus is right outside his house   Prior employment [employer]   Reports strong faith helps him cope with life   Has had to self-cath twice daily since 2008 -Dr. Jeffie Pollock   [facility] for ostomy care and supplies   Ambulates w/walker-independent in ADL's-cooks pot pies and frozen dinners    FAMILY HISTORY: Family History  Problem Relation Age of Onset  . Cancer Father     unknown type cancer     ALLERGIES:  is allergic to ciprofloxacin hcl and ibuprofen.  MEDICATIONS:  Current Outpatient  Prescriptions  Medication Sig Dispense Refill  . capecitabine (XELODA) 500 MG tablet Take 4 tablets (2,000 mg total) by mouth 2 (two) times daily after a meal. 112 tablet 4  . cetirizine (ZYRTEC) 10 MG tablet TK 1 T PO D  2  . fluticasone (FLONASE) 50 MCG/ACT nasal spray Place 1 spray into both nostrils daily. 16 g 2  . pravastatin (PRAVACHOL) 20 MG tablet Take 1 tablet (20 mg total) by mouth daily. 90 tablet 4  . gabapentin (NEURONTIN) 100 MG capsule Take 1-2 capsules (100-200 mg total) by mouth at bedtime. 60 capsule 1   No current facility-administered medications for this visit.    REVIEW OF SYSTEMS:   Constitutional: Denies fevers, chills or abnormal night sweats Eyes: Denies blurriness of vision, double vision or watery eyes Ears, nose, mouth, throat, and face: Denies mucositis or sore throat Respiratory: Denies cough, dyspnea or wheezes Cardiovascular: Denies palpitation, chest discomfort or lower extremity swelling Gastrointestinal:  Denies nausea, heartburn or change in bowel habits Skin: Denies abnormal skin rashes Lymphatics: Denies new lymphadenopathy or easy bruising Neurological:Denies numbness, tingling or new  weaknesses Behavioral/Psych: Mood is stable, no new changes  All other systems were reviewed with the patient and are negative.  PHYSICAL EXAMINATION: ECOG PERFORMANCE STATUS: 1 - Symptomatic but completely ambulatory  Filed Vitals:   07/14/15 1250  BP: 147/79  Pulse: 52  Temp: 97.7 F (36.5 C)  Resp: 16   Filed Weights   07/14/15 1250  Weight: 182 lb 3.2 oz (82.645 kg)    GENERAL:alert, no distress and comfortable SKIN: skin color, texture, turgor are normal, no rashes or significant lesions EYES: normal, conjunctiva are pink and non-injected, sclera clear OROPHARYNX:no exudate, no erythema and lips, buccal mucosa, and tongue normal  NECK: supple, thyroid normal size, non-tender, without nodularity LYMPH:  no palpable lymphadenopathy in the cervical, axillary or inguinal LUNGS: clear to auscultation and percussion with normal breathing effort HEART: regular rate & rhythm and no murmurs and no lower extremity edema ABDOMEN:abdomen soft, non-tender and normal bowel sounds, surgical incision is well healed, (+) colostomy bag in the left abdomen. Musculoskeletal:no cyanosis of digits and no clubbing  PSYCH: alert & oriented x 3 with fluent speech NEURO: no focal motor/sensory deficits  LABORATORY DATA:  I have reviewed the data as listed  CBC Latest Ref Rng 07/14/2015 06/23/2015 06/09/2015  WBC 4.0 - 10.3 10e3/uL 7.1 5.7 -  Hemoglobin 13.0 - 17.1 g/dL 13.3 13.2 15.0  Hematocrit 38.4 - 49.9 % 40.2 38.8 44.0  Platelets 140 - 400 10e3/uL 238 220 -    CMP Latest Ref Rng 07/14/2015 06/23/2015 06/09/2015  Glucose 70 - 140 mg/dl 99 96 91  BUN 7.0 - 26.0 mg/dL 20.7 17.8 21(H)  Creatinine 0.7 - 1.3 mg/dL 1.0 1.0 1.00  Sodium 136 - 145 mEq/L 138 140 143  Potassium 3.5 - 5.1 mEq/L 4.0 4.3 3.9  Chloride 101 - 111 mmol/L - - 103  CO2 22 - 29 mEq/L 27 30(H) -  Calcium 8.4 - 10.4 mg/dL 8.3(L) 9.1 -  Total Protein 6.4 - 8.3 g/dL 6.6 7.0 -  Total Bilirubin 0.20 - 1.20 mg/dL 0.71 0.82 -   Alkaline Phos 40 - 150 U/L 66 63 -  AST 5 - 34 U/L 13 14 -  ALT 0 - 55 U/L <9 6 -      CEA  Status: Finalresult Visible to patient:  Not Released Nextappt: Today at 11:00 AM in Oncology ([place])  Ref Range 3wk ago    CEA 0.0 - 4.7 ng/mL 2.5         PATHOLOGY REPORT  Diagnosis 03/12/2015 Colon, segmental resection for tumor, sigmoid colon - INVASIVE WELL-DIFFERENTIATED ADENOCARCINOMA WITH ASSOCIATED ABUNDANT MUCIN, SPANNING 7 CM IN GREATEST DIMENSION. 1 of 4 - TUMOR INVADES THROUGH MUSCULARIS PROPRIA THROUGH PERICOLORECTAL SOFT TISSUES TO INVOLVE SEROSAL SURFACE. - MARGINS ARE NEGATIVE. - FOURTEEN BENIGN LYMPH NODES WITH NO TUMOR SEEN (0/14). - SINGLE SOFT TISSUE SATELLITE  Microscopic Comment COLON AND RECTUM: Specimen: Sigmoid colon. Procedure: Resection of sigmoid colon. Tumor site: Sigmoid colon, distal aspect of specimen. Specimen integrity: Intact.  Macroscopic tumor perforation: Not identified. Invasive tumor: Maximum size: 7 cm. Histologic type(s): Invasive adenocarcinoma with abundant extracellular mucin. Histologic grade and differentiation: G1: well differentiated/low grade Type of polyp in which invasive carcinoma arose: Precursor polyp is not identified. Microscopic extension of invasive tumor: Tumor invades through muscularis propria through pericolorectal soft tissues to involve serosal surface. Lymph-Vascular invasion: Definitive lymph/vascular invasion is not identified; however, a tumor deposit is present, see below. Peri-neural invasion: Not identified. Tumor deposit(s) (discontinuous extramural extension): Yes, tumor deposit present. Resection margins: Proximal margin: Negative. Distal margin: Negative. Mesenteric margin (sigmoid and transverse): Negative. Distance closest margin (if all above margins negative): 3.5 cm (mesenteric margin). Treatment effect (neo-adjuvant therapy): Not  applicable. Additional polyp(s): No additional polyps identified. Non-neoplastic findings: No significant non-neoplastic findings. Lymph nodes: number examined 14; number positive: 0. Pathologic Staging: pT4a, pN1c. Ancillary studies: Per colorectal cancer protocol, MMR by IHC and MSI by PCR will be performed on the current tumor. (RH:ecj 03/16/2015)  ADDITIONAL INFORMATION: Mismatch Repair (MMR) Protein Immunohistochemistry (IHC) IHC Expression Result: MLH1: Preserved nuclear expression (greater 50% tumor expression) MSH2: Preserved nuclear expression (greater 50% tumor expression) MSH6: Preserved nuclear expression (greater 50% tumor expression) PMS2: Preserved nuclear expression (greater 50% tumor expression) * Internal control demonstrates intact nuclear expression Interpretation: NORMAL    RADIOGRAPHIC STUDIES: I have personally reviewed the radiological images as listed and agreed with the findings in the report.  Ct Abdomen Pelvis W Contrast 03/11/2015    FINDINGS: Lower chest: Linear subsegmental atelectasis in the lung bases. No effusions. Heart is upper limits normal in size.  Hepatobiliary: Scattered small nonspecific hypodensities throughout the liver, stable. Gallbladder unremarkable. No biliary ductal dilatation.  Pancreas: No focal abnormality or ductal dilatation.  Spleen: No focal abnormality.  Normal size.  Adrenals/Urinary Tract: No focal renal or adrenal abnormality. No hydronephrosis. Urinary bladder is unremarkable.  Stomach/Bowel: The colon is dilated and fluid-filled with air-fluid levels. Stomach and small bowel are decompressed. The colon is dilated to an annular constricting lesion within the sigmoid colon.  Vascular/Lymphatic: No retroperitoneal or mesenteric adenopathy. Aorta normal caliber.  Reproductive: No mass or other significant abnormality.  Other: No free fluid or free air.  Musculoskeletal: Degenerative changes in the lumbar spine. No acute findings.    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 Findings compatible with distal colonic obstructing lesion concerning for malignancy.  Tiny hypodensities scattered throughout the liver, nonspecific, favor small cysts.   Electronically Signed   By: Rolm Baptise M.D.   On: 03/11/2015 21:17    ASSESSMENT & PLAN: 74 year old African-American male  1. Sigmoid colon cancer, pT4N1cM0, stage IIIB, grade 1, MMR normal -I reviewed his CT abdomen and pelvis findings, and his surgical pathology results in great details with the patient. -Giving the advanced stage IIIB disease, he doesn't  have moderate to high risk of cancer recurrence after surgery. -We discussed the role of adjuvant chemotherapy to reduce the risk of cancer recurrence, the standard is 5-FU or capecitabine along with oxaliplatin. Although he is 15, his general health and performance status is relatively good, I think he would be a candidate for FOLFOX. The benefits and risks of chemotherapy were discussed with patient, he agrees to proceed. Alternatively we can consider 5-FU or capecitabine alone, if he has difficulty tolerating FOLFOX. -He lateron changed his mind to single agent Xeloda. He is on cycle 4 now, tolerating well. We'll continue, total of 6 months treatment -Lab reviewed, no significant cytopenia.  -Mild peripheral neuropathy, we'll continue monitoring closely -start cycle 5 on Monday 10/3  2. Diarrhea -Proberbly related to Xeloda  -I encouraged him to use Imodium as needed, and increase oral fluid intake to avoid dehydration  3. 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 Atony of bladder -He does straight cath twice a day, and on chronic Bactrim to prevent UTI. -He'll continue follow-up with his urologist. -We discussed the increased risk of UTI and other infection with  chemotherapy  5. Hypertension, arthritis -He will continue follow-up with his primary care physician  Plan -start cycle 5 Xeloda next Monday 10/3 -RTC in 3-4 weeks before cycle 6   All questions were answered. The patient knows to call the clinic with any problems, questions or concerns. I spent 20 minutes counseling the patient face to face. The total time spent in the appointment was 25 minutes and more than 50% was on counseling.     Truitt Merle, MD 07/14/2015 1:33 PM

## 2015-07-14 NOTE — Telephone Encounter (Signed)
Pt confirmed labs/ov per 09/22 POF, gave pt AVS and Calendar... KJ °

## 2015-07-22 ENCOUNTER — Telehealth: Payer: Self-pay | Admitting: Hematology

## 2015-07-22 NOTE — Telephone Encounter (Signed)
pt cld to chge tele #-updatedfor pt

## 2015-08-04 DIAGNOSIS — R300 Dysuria: Secondary | ICD-10-CM | POA: Diagnosis not present

## 2015-08-04 DIAGNOSIS — N3020 Other chronic cystitis without hematuria: Secondary | ICD-10-CM | POA: Diagnosis not present

## 2015-08-10 ENCOUNTER — Telehealth: Payer: Self-pay | Admitting: Hematology

## 2015-08-10 ENCOUNTER — Other Ambulatory Visit (HOSPITAL_BASED_OUTPATIENT_CLINIC_OR_DEPARTMENT_OTHER): Payer: Medicare Other

## 2015-08-10 ENCOUNTER — Encounter: Payer: Self-pay | Admitting: Hematology

## 2015-08-10 ENCOUNTER — Ambulatory Visit (HOSPITAL_BASED_OUTPATIENT_CLINIC_OR_DEPARTMENT_OTHER): Payer: Medicare Other | Admitting: Hematology

## 2015-08-10 VITALS — BP 146/84 | HR 50 | Temp 97.7°F | Resp 18 | Ht 71.0 in | Wt 178.9 lb

## 2015-08-10 DIAGNOSIS — I10 Essential (primary) hypertension: Secondary | ICD-10-CM | POA: Diagnosis not present

## 2015-08-10 DIAGNOSIS — M1990 Unspecified osteoarthritis, unspecified site: Secondary | ICD-10-CM | POA: Diagnosis not present

## 2015-08-10 DIAGNOSIS — G622 Polyneuropathy due to other toxic agents: Secondary | ICD-10-CM

## 2015-08-10 DIAGNOSIS — N312 Flaccid neuropathic bladder, not elsewhere classified: Secondary | ICD-10-CM

## 2015-08-10 DIAGNOSIS — C187 Malignant neoplasm of sigmoid colon: Secondary | ICD-10-CM

## 2015-08-10 DIAGNOSIS — R197 Diarrhea, unspecified: Secondary | ICD-10-CM | POA: Diagnosis not present

## 2015-08-10 DIAGNOSIS — C189 Malignant neoplasm of colon, unspecified: Secondary | ICD-10-CM

## 2015-08-10 LAB — CBC WITH DIFFERENTIAL/PLATELET
BASO%: 0.9 % (ref 0.0–2.0)
Basophils Absolute: 0.1 10*3/uL (ref 0.0–0.1)
EOS ABS: 0.3 10*3/uL (ref 0.0–0.5)
EOS%: 5.4 % (ref 0.0–7.0)
HCT: 40.1 % (ref 38.4–49.9)
HEMOGLOBIN: 13.5 g/dL (ref 13.0–17.1)
LYMPH#: 1.3 10*3/uL (ref 0.9–3.3)
LYMPH%: 23.8 % (ref 14.0–49.0)
MCH: 34.4 pg — ABNORMAL HIGH (ref 27.2–33.4)
MCHC: 33.7 g/dL (ref 32.0–36.0)
MCV: 102 fL — ABNORMAL HIGH (ref 79.3–98.0)
MONO#: 0.5 10*3/uL (ref 0.1–0.9)
MONO%: 9.5 % (ref 0.0–14.0)
NEUT%: 60.4 % (ref 39.0–75.0)
NEUTROS ABS: 3.2 10*3/uL (ref 1.5–6.5)
NRBC: 0 % (ref 0–0)
PLATELETS: 209 10*3/uL (ref 140–400)
RBC: 3.93 10*6/uL — ABNORMAL LOW (ref 4.20–5.82)
RDW: 16.9 % — AB (ref 11.0–14.6)
WBC: 5.4 10*3/uL (ref 4.0–10.3)

## 2015-08-10 LAB — COMPREHENSIVE METABOLIC PANEL (CC13)
ALBUMIN: 3.7 g/dL (ref 3.5–5.0)
AST: 13 U/L (ref 5–34)
Alkaline Phosphatase: 69 U/L (ref 40–150)
Anion Gap: 5 mEq/L (ref 3–11)
BILIRUBIN TOTAL: 1.02 mg/dL (ref 0.20–1.20)
BUN: 15.8 mg/dL (ref 7.0–26.0)
CHLORIDE: 107 meq/L (ref 98–109)
CO2: 28 mEq/L (ref 22–29)
Calcium: 9 mg/dL (ref 8.4–10.4)
Creatinine: 1.1 mg/dL (ref 0.7–1.3)
EGFR: 73 mL/min/{1.73_m2} — ABNORMAL LOW (ref >90)
Glucose: 99 mg/dl (ref 70–140)
Potassium: 4.3 mEq/L (ref 3.5–5.1)
Sodium: 139 mEq/L (ref 136–145)
TOTAL PROTEIN: 7.1 g/dL (ref 6.4–8.3)

## 2015-08-10 NOTE — Telephone Encounter (Signed)
per pof to sch pt appt-gave pt copy of avs °

## 2015-08-10 NOTE — Progress Notes (Signed)
[place]  [phone]) [phone] [phone]  Clinic Follow up Note   Patient Care Team: Norman Herrlich, MD as PCP - General Judeth Horn, MD as Consulting Physician (General Surgery) Truitt Merle, MD as Consulting Physician (Hematology) 08/10/2015  CHIEF COMPLAINTS:  Follow up stage III colon cancer   Oncology History   Colon adenocarcinoma   Staging form: Colon and Rectum, AJCC 7th Edition     Pathologic stage from 03/12/2015: Stage IIIB (T4a, N1c, cM0) - Signed by Truitt Merle, MD on 04/05/2015        Colon adenocarcinoma ([place])   02/27/2015 Imaging CT abdomen and pelvis showed an annular constricting lesion within the sigmoid colon, no significant adenopathy or distant metastasis. CT abdomen and pelvis on 03/11/2015 showed similar findings with obstruction.   03/12/2015 Initial Diagnosis Sigmoid colon adenocarcinoma   03/12/2015 Tumor Marker CEA normal 2.5   03/12/2015 Surgery Sigmoid colon segmental resection by Dr. Excell Seltzer, surgical margins were negative.   03/12/2015 Pathology Results Well differentiated invasive adenocarcinoma with associated abundant mucin, 14 lymph nodes were negative, a tumor deposit was present, surgical margins were negative. pT4N1c   04/26/2015 -  Chemotherapy Capecitabine po 2017m q12h, 2 weeks on, one week off, as adjuvant chemo     HISTORY OF PRESENTING ILLNESS:  Greg [name] 74y.o. male is here because of stage III colon cancer. He was referred after his recent hospitalization.   He has been having abdominal discomfort and nausea for the past few months, along with some blood in his stool. No significant weight loss or change of his appetite. He presented to the ED 02/27/15 for abdominal pain with x-ray showing possible obstruction but CT scan negative for obstruction or ileus but it did indicate an annular constricting lesion in the sigmoid. He was seen by GI Dr OPaulita Fujitaper colonoscopy failed due to the poor prep.  He presented to  ED again on 03/12/2015. CT abd/pelvis showed annular constricting lesion within the sigmoid colon with markedly dilated colon proximal to this lesion with air-fluid levels, compatible with distal colonic obstructing lesion concerning for malignancy as well as tiny hypodensities scattered throughout the liver, nonspecific but favor small cysts. General surgery was called and performed sigmoid colectomy with colostomy on 03/12/2015 by Dr. WHulen Skains The patient's post-operative course was complicated only by hypokalemia which was repeatedly replenished with IV and oral KCl. He received IV hydration and pain management and his diet was slowly advanced as tolerated. Physical and occupational therapy saw the patient and made recommendations. He was discharged to home with home health PT and ostomy care on 03/19/2015.   He has been recovering well from the surgery. He has no significant pain at the surgical site, no nausea, normal stool output in the colostomy bag. He has a mild to moderate fatigue, able to take care of himself and tolerating daily activities. He lives alone, uses walker as needed. He feels his energy level has been recovered well. He has a brother who lives for 5 blocks away, and helps him out when he needs.  CURRENT THERAPY: adjuvant Xeloda 2000 mg every 12 hours, 2 weeks on, one-week off, started on 04/26/2015  INTERIM HISTORY: He returns for follow up. He completed cycle 5 Xeloda 2 days ago, and he is off this week. He is tolerating treatment well, still has a mild diarrhea for which he takes Imodium. He also has some numbness and tingling at the fingers and toes, mild, slightly more prominent lately, no difficulty with  buttoning or writing, he takes Neurontin 1 tablets at night. He also has mild skin peeling on her hands. No other new complaints. He has good appetite and eating well. He lost about 4 pounds in the past 3 weeks.  MEDICAL HISTORY:  Past Medical History  Diagnosis Date  .  Hyperlipidemia   . Hypertension   . Epididymitis     right  . Atony of bladder     followed at [facility]; CIC 3x/day, has been treated for culture proven serratia marcesens 05/2011, 06/2011, 07/2011, 09/2011  . [name] (benign prostatic hyperplasia)     hyposensitive bladder, nodular prostate with increased PSA (10.23 in 03/2006), s/p prostate biopsy 2006 that was normal (Dr Jeffie Pollock) he recommended TURP.  . Sinus bradycardia   . Chronic knee pain     left  . Gingivitis   . Right shoulder pain     sx in 06/2005  . Frozen shoulder     left frozen shoulder  . Shingles   . Cancer [facility])     SURGICAL HISTORY: Past Surgical History  Procedure Laterality Date  . Vasectomy    . Hydrocelectomy  11/14/2010    left hydrocelectomy  . Right shoulder arthroscopic surgery  06/2005  . Transurethral resection of prostate      status post TURP 2/2 [name] in 2008  . Knee surgery      L knee  . Laparotomy N/A 03/12/2015    Procedure: EXPLORATORY LAPAROTOMY;  Surgeon: Judeth Horn, MD;  Location: [place];  Service: General;  Laterality: N/A;  . Colostomy revision N/A 03/12/2015    Procedure:  RESECTION SIGMOID COLON;  Surgeon: Judeth Horn, MD;  Location: [place];  Service: General;  Laterality: N/A;  . Colostomy N/A 03/12/2015    Procedure: COLOSTOMY;  Surgeon: Judeth Horn, MD;  Location: [place];  Service: General;  Laterality: N/A;    SOCIAL HISTORY: Social History   Social History  . Marital Status: Single    Spouse Name: N/A  . Number of Children: N/A  . Years of Education: 12   Occupational History  .      retired   Social History Main Topics  . Smoking status: Former Smoker -- 0.50 packs/day for 25 years    Types: Cigarettes    Quit date: 02/07/1988  . Smokeless tobacco: Not on file  . Alcohol Use: 0.0 oz/week    0 Standard drinks or equivalent per week     Comment: occasional   . Drug Use: No  . Sexual Activity: Not on file   Other Topics Concern  . Not on file   Social History  Narrative   Single, lives alone in apartment   No children   Several local siblings in area-will take him to grocery store, etc   Rides bus (75cents/trip) and bus is right outside his house   Prior employment [employer]   Reports strong faith helps him cope with life   Has had to self-cath twice daily since 2008 -Dr. Jeffie Pollock   [facility] for ostomy care and supplies   Ambulates w/walker-independent in ADL's-cooks pot pies and frozen dinners    FAMILY HISTORY: Family History  Problem Relation Age of Onset  . Cancer Father     unknown type cancer     ALLERGIES:  is allergic to ciprofloxacin hcl and ibuprofen.  MEDICATIONS:  Current Outpatient Prescriptions  Medication Sig Dispense Refill  . capecitabine (XELODA) 500 MG tablet Take 4 tablets (2,000 mg total)  by mouth 2 (two) times daily after a meal. 112 tablet 4  . cetirizine (ZYRTEC) 10 MG tablet TK 1 T PO D  2  . fluticasone (FLONASE) 50 MCG/ACT nasal spray Place 1 spray into both nostrils daily. 16 g 2  . gabapentin (NEURONTIN) 100 MG capsule Take 1-2 capsules (100-200 mg total) by mouth at bedtime. 60 capsule 1  . pravastatin (PRAVACHOL) 20 MG tablet Take 1 tablet (20 mg total) by mouth daily. 90 tablet 4  . sulfamethoxazole-trimethoprim (BACTRIM DS,SEPTRA DS) 800-160 MG tablet     . traMADol (ULTRAM) 50 MG tablet      No current facility-administered medications for this visit.    REVIEW OF SYSTEMS:   Constitutional: Denies fevers, chills or abnormal night sweats Eyes: Denies blurriness of vision, double vision or watery eyes Ears, nose, mouth, throat, and face: Denies mucositis or sore throat Respiratory: Denies cough, dyspnea or wheezes Cardiovascular: Denies palpitation, chest discomfort or lower extremity swelling Gastrointestinal:  Denies nausea, heartburn or change in bowel habits Skin: Denies abnormal skin rashes Lymphatics: Denies new lymphadenopathy or easy bruising Neurological:Denies numbness,  tingling or new weaknesses Behavioral/Psych: Mood is stable, no new changes  All other systems were reviewed with the patient and are negative.  PHYSICAL EXAMINATION: ECOG PERFORMANCE STATUS: 1 - Symptomatic but completely ambulatory  Filed Vitals:   08/10/15 1300  BP: 146/84  Pulse: 50  Temp: 97.7 F (36.5 C)  Resp: 18   Filed Weights   08/10/15 1300  Weight: 178 lb 14.4 oz (81.149 kg)    GENERAL:alert, no distress and comfortable SKIN: skin color, texture, turgor are normal, no rashes or significant lesions EYES: normal, conjunctiva are pink and non-injected, sclera clear OROPHARYNX:no exudate, no erythema and lips, buccal mucosa, and tongue normal  NECK: supple, thyroid normal size, non-tender, without nodularity LYMPH:  no palpable lymphadenopathy in the cervical, axillary or inguinal LUNGS: clear to auscultation and percussion with normal breathing effort HEART: regular rate & rhythm and no murmurs and no lower extremity edema ABDOMEN:abdomen soft, non-tender and normal bowel sounds, surgical incision is well healed, (+) colostomy bag in the left abdomen. Musculoskeletal:no cyanosis of digits and no clubbing  PSYCH: alert & oriented x 3 with fluent speech NEURO: no focal motor/sensory deficits  LABORATORY DATA:  I have reviewed the data as listed  CBC Latest Ref Rng 08/10/2015 07/14/2015 06/23/2015  WBC 4.0 - 10.3 10e3/uL 5.4 7.1 5.7  Hemoglobin 13.0 - 17.1 g/dL 13.5 13.3 13.2  Hematocrit 38.4 - 49.9 % 40.1 40.2 38.8  Platelets 140 - 400 10e3/uL 209 238 220    CMP Latest Ref Rng 08/10/2015 07/14/2015 06/23/2015  Glucose 70 - 140 mg/dl 99 99 96  BUN 7.0 - 26.0 mg/dL 15.8 20.7 17.8  Creatinine 0.7 - 1.3 mg/dL 1.1 1.0 1.0  Sodium 136 - 145 mEq/L 139 138 140  Potassium 3.5 - 5.1 mEq/L 4.3 4.0 4.3  Chloride 101 - 111 mmol/L - - -  CO2 22 - 29 mEq/L 28 27 30(H)  Calcium 8.4 - 10.4 mg/dL 9.0 8.3(L) 9.1  Total Protein 6.4 - 8.3 g/dL 7.1 6.6 7.0  Total Bilirubin 0.20 - 1.20  mg/dL 1.02 0.71 0.82  Alkaline Phos 40 - 150 U/L 69 66 63  AST 5 - 34 U/L _0 ALT 0 - 55 U/L <9 <9 6      CEA  Status: Finalresult Visible to patient:  Not Released Nextappt: Today at 11:00 AM in Oncology ([place])  Ref Range 3wk ago    CEA 0.0 - 4.7 ng/mL 2.5         PATHOLOGY REPORT  Diagnosis 03/12/2015 Colon, segmental resection for tumor, sigmoid colon - INVASIVE WELL-DIFFERENTIATED ADENOCARCINOMA WITH ASSOCIATED ABUNDANT MUCIN, SPANNING 7 CM IN GREATEST DIMENSION. 1 of 4 - TUMOR INVADES THROUGH MUSCULARIS PROPRIA THROUGH PERICOLORECTAL SOFT TISSUES TO INVOLVE SEROSAL SURFACE. - MARGINS ARE NEGATIVE. - FOURTEEN BENIGN LYMPH NODES WITH NO TUMOR SEEN (0/14). - SINGLE SOFT TISSUE SATELLITE  Microscopic Comment COLON AND RECTUM: Specimen: Sigmoid colon. Procedure: Resection of sigmoid colon. Tumor site: Sigmoid colon, distal aspect of specimen. Specimen integrity: Intact.  Macroscopic tumor perforation: Not identified. Invasive tumor: Maximum size: 7 cm. Histologic type(s): Invasive adenocarcinoma with abundant extracellular mucin. Histologic grade and differentiation: G1: well differentiated/low grade Type of polyp in which invasive carcinoma arose: Precursor polyp is not identified. Microscopic extension of invasive tumor: Tumor invades through muscularis propria through pericolorectal soft tissues to involve serosal surface. Lymph-Vascular invasion: Definitive lymph/vascular invasion is not identified; however, a tumor deposit is present, see below. Peri-neural invasion: Not identified. Tumor deposit(s) (discontinuous extramural extension): Yes, tumor deposit present. Resection margins: Proximal margin: Negative. Distal margin: Negative. Mesenteric margin (sigmoid and transverse): Negative. Distance closest margin (if all above margins negative): 3.5 cm (mesenteric margin). Treatment effect (neo-adjuvant therapy):  Not applicable. Additional polyp(s): No additional polyps identified. Non-neoplastic findings: No significant non-neoplastic findings. Lymph nodes: number examined 14; number positive: 0. Pathologic Staging: pT4a, pN1c. Ancillary studies: Per colorectal cancer protocol, MMR by IHC and MSI by PCR will be performed on the current tumor. (RH:ecj 03/16/2015)  ADDITIONAL INFORMATION: Mismatch Repair (MMR) Protein Immunohistochemistry (IHC) IHC Expression Result: MLH1: Preserved nuclear expression (greater 50% tumor expression) MSH2: Preserved nuclear expression (greater 50% tumor expression) MSH6: Preserved nuclear expression (greater 50% tumor expression) PMS2: Preserved nuclear expression (greater 50% tumor expression) * Internal control demonstrates intact nuclear expression Interpretation: NORMAL    RADIOGRAPHIC STUDIES: I have personally reviewed the radiological images as listed and agreed with the findings in the report.  Ct Abdomen Pelvis W Contrast 03/11/2015    FINDINGS: Lower chest: Linear subsegmental atelectasis in the lung bases. No effusions. Heart is upper limits normal in size.  Hepatobiliary: Scattered small nonspecific hypodensities throughout the liver, stable. Gallbladder unremarkable. No biliary ductal dilatation.  Pancreas: No focal abnormality or ductal dilatation.  Spleen: No focal abnormality.  Normal size.  Adrenals/Urinary Tract: No focal renal or adrenal abnormality. No hydronephrosis. Urinary bladder is unremarkable.  Stomach/Bowel: The colon is dilated and fluid-filled with air-fluid levels. Stomach and small bowel are decompressed. The colon is dilated to an annular constricting lesion within the sigmoid colon.  Vascular/Lymphatic: No retroperitoneal or mesenteric adenopathy. Aorta normal caliber.  Reproductive: No mass or other significant abnormality.  Other: No free fluid or free air.  Musculoskeletal: Degenerative changes in the lumbar spine. No acute findings.     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 Findings compatible with distal colonic obstructing lesion concerning for malignancy.  Tiny hypodensities scattered throughout the liver, nonspecific, favor small cysts.   Electronically Signed   By: Rolm Baptise M.D.   On: 03/11/2015 21:17    ASSESSMENT & PLAN: 74 year old African-American male  1. Sigmoid colon cancer, pT4N1cM0, stage IIIB, grade 1, MMR normal -I reviewed his CT abdomen and pelvis findings, and his surgical pathology results in great details with the patient. -Giving the advanced stage IIIB disease, he  doesn't have moderate to high risk of cancer recurrence after surgery. -We discussed the role of adjuvant chemotherapy to reduce the risk of cancer recurrence, the standard is 5-FU or capecitabine along with oxaliplatin. Although he is 56, his general health and performance status is relatively good, I think he would be a candidate for FOLFOX. The benefits and risks of chemotherapy were discussed with patient, he agrees to proceed. Alternatively we can consider 5-FU or capecitabine alone, if he has difficulty tolerating FOLFOX. -He lateron changed his mind to single agent Xeloda. He is on cycle 5 now, tolerating well. We'll continue, total of 8 cycles(6 months) treatment -Lab reviewed, no significant cytopenia.  -Mild peripheral neuropathy, we'll continue monitoring closely -start cycle 6 on Monday 10/24  2. Diarrhea -Proberbly related to Xeloda, controlled  -I encouraged him to use Imodium as needed, and increase oral fluid intake to avoid dehydration  3. 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 Atony of bladder -He does straight cath twice a day, and on chronic Bactrim to prevent UTI. -He'll continue follow-up with  his urologist. -We discussed the increased risk of UTI and other infection with chemotherapy  5. Hypertension, arthritis -He will continue follow-up with his primary care physician  Plan -start cycle 6 Xeloda next Monday 10/24 -increase neurontin  -RTC in 3 weeks before cycle 7   All questions were answered. The patient knows to call the clinic with any problems, questions or concerns. I spent 20 minutes counseling the patient face to face. The total time spent in the appointment was 25 minutes and more than 50% was on counseling.     Truitt Merle, MD 08/10/2015 7:08 PM

## 2015-08-25 ENCOUNTER — Ambulatory Visit (INDEPENDENT_AMBULATORY_CARE_PROVIDER_SITE_OTHER): Payer: Medicare Other | Admitting: Internal Medicine

## 2015-08-25 ENCOUNTER — Encounter: Payer: Self-pay | Admitting: Internal Medicine

## 2015-08-25 VITALS — BP 135/89 | HR 54 | Temp 97.8°F | Wt 183.7 lb

## 2015-08-25 DIAGNOSIS — J069 Acute upper respiratory infection, unspecified: Secondary | ICD-10-CM | POA: Diagnosis not present

## 2015-08-25 DIAGNOSIS — Z87891 Personal history of nicotine dependence: Secondary | ICD-10-CM | POA: Diagnosis not present

## 2015-08-25 MED ORDER — GUAIFENESIN ER 600 MG PO TB12: 600.0000 mg | ORAL_TABLET | Freq: Two times a day (BID) | ORAL | Status: DC

## 2015-08-25 NOTE — Patient Instructions (Signed)
TAKE THE FOLLOWING FOR RUNNY NOSE: -FLONASE 1-2 SPRAYS IN EACH NOSTRIL ONCE A DAY -CONTINUE TAKING YOUR ZYRTEC -TAKE GUAIFENESIN (MUCINEX) ONE PILL TWICE A DAY  IF YOUR BLOOD COUNTS ARE LOW, WE WILL ORDER ANTIBIOTICS

## 2015-08-25 NOTE — Progress Notes (Signed)
Subjective:    Patient ID: Greg [name], male    DOB: 1940-11-16, 74 y.o.   MRN: [id number]  HPI Greg [name] is a 74 y.o. male with PMHx of colon cancer s/p resection, chemo and colostomy who presents to the clinic for nasal lacrimation and sinus pressure. Please see A&P for the status of the patient's chronic medical problems.   Patient admits to 2 weeks history of runny nose, sinus pressure. He has to clear his throat in the morning and coughs up some clear mucous, but this does not occur frequently throughout the day. Nasal congestion comes and goes, but the lacrimation is the worst part. He denies any headache, fever, chills, ear pain, sore throat, shortness of breath, nausea, vomiting or diarrhea. He is unaware of any sick contacts. He is on round 5 of chemo. He is on Bactrim chronically for UTI prevention due to bladder atony and self-catheterizations. He has not tried anything for his symptoms other than his daily Zyrtec for allergies. Last CBC was normal in October.  Past Medical History  Diagnosis Date  . Hyperlipidemia   . Hypertension   . Epididymitis     right  . Atony of bladder     followed at [facility]; CIC 3x/day, has been treated for culture proven serratia marcesens 05/2011, 06/2011, 07/2011, 09/2011  . [name] (benign prostatic hyperplasia)     hyposensitive bladder, nodular prostate with increased PSA (10.23 in 03/2006), s/p prostate biopsy 2006 that was normal (Dr Jeffie Pollock) he recommended TURP.  . Sinus bradycardia   . Chronic knee pain     left  . Gingivitis   . Right shoulder pain     sx in 06/2005  . Frozen shoulder     left frozen shoulder  . Shingles   . Cancer [facility])     Outpatient Encounter Prescriptions as of 08/25/2015  Medication Sig Note  . capecitabine (XELODA) 500 MG tablet Take 4 tablets (2,000 mg total) by mouth 2 (two) times daily after a meal.   . cetirizine (ZYRTEC) 10 MG tablet TK 1 T PO D 07/14/2015: Received from: External Pharmacy  .  fluticasone (FLONASE) 50 MCG/ACT nasal spray Place 1 spray into both nostrils daily.   Marland Kitchen gabapentin (NEURONTIN) 100 MG capsule Take 1-2 capsules (100-200 mg total) by mouth at bedtime.   Marland Kitchen guaiFENesin (MUCINEX) 600 MG 12 hr tablet Take 1 tablet (600 mg total) by mouth 2 (two) times daily.   . pravastatin (PRAVACHOL) 20 MG tablet Take 1 tablet (20 mg total) by mouth daily.   Marland Kitchen sulfamethoxazole-trimethoprim (BACTRIM DS,SEPTRA DS) 800-160 MG tablet  08/10/2015: Received from: External Pharmacy  . traMADol (ULTRAM) 50 MG tablet  08/10/2015: Received from: External Pharmacy   No facility-administered encounter medications on file as of 08/25/2015.    Family History  Problem Relation Age of Onset  . Cancer Father     unknown type cancer     Social History   Social History  . Marital Status: Single    Spouse Name: N/A  . Number of Children: N/A  . Years of Education: 12   Occupational History  .      retired   Social History Main Topics  . Smoking status: Former Smoker -- 0.50 packs/day for 25 years    Types: Cigarettes    Quit date: 02/07/1988  . Smokeless tobacco: Not on file  . Alcohol Use: 0.0 oz/week    0 Standard drinks or equivalent per week  Comment: occasional   . Drug Use: No  . Sexual Activity: Not on file   Other Topics Concern  . Not on file   Social History Narrative   Single, lives alone in apartment   No children   Several local siblings in area-will take him to grocery store, etc   Rides bus (75cents/trip) and bus is right outside his house   Prior employment [employer]   Reports strong faith helps him cope with life   Has had to self-cath twice daily since 2008 -Dr. Jeffie Pollock   [facility] for ostomy care and supplies   Ambulates w/walker-independent in ADL's-cooks pot pies and frozen dinners    Review of Systems General: Denies fever, chills, fatigue, change in appetite and diaphoresis.  HEENT: Admits to rhinorrhea, intermittent congestion,  sinus pressure.  Respiratory: Admits to occasional cough. Denies SOB, chest tightness, and wheezing.   Cardiovascular: Denies chest pain and palpitations.  Gastrointestinal: Denies nausea, vomiting, abdominal pain, diarrhea Neurological: Denies dizziness, headaches, weakness, lightheadedness     Objective:   Physical Exam Filed Vitals:   08/25/15 1325  BP: 135/89  Pulse: 54  Temp: 97.8 F (36.6 C)  TempSrc: Oral  Weight: 183 lb 11.2 oz (83.326 kg)  SpO2: 98%   General: Vital signs reviewed.  Patient is well-developed and well-nourished, in no acute distress and cooperative with exam.  HEENT: Normocephalic and atraumatic. EOMI, conjunctivae normal, no conjunctival injection. Supple, trachea midline. Normal tympanic membranes bilaterally, erythematous, edematous nasal turbinates, normal pink posterior oropharynx without exudate.  Cardiovascular: RRR, S1 normal, S2 normal, no murmurs, gallops, or rubs. Pulmonary/Chest: Clear to auscultation bilaterally, no wheezes, rales, or rhonchi. Abdominal: Soft, non-tender, non-distended, BS +, colostomy bag in place in LLQ.  Extremities: No lower extremity edema bilaterally Skin: Warm, dry and intact. No rashes or erythema. Psychiatric: Normal mood and affect. speech and behavior is normal. Cognition and memory are normal.      Assessment & Plan:   Please see problem based assessment and plan.

## 2015-08-25 NOTE — Assessment & Plan Note (Signed)
Patient admits to 2 weeks history of runny nose, sinus pressure. He has to clear his throat in the morning and coughs up some clear mucous, but this does not occur frequently throughout the day. Nasal congestion comes and goes, but the lacrimation is the worst part. He denies any headache, fever, chills, ear pain, sore throat, shortness of breath, nausea, vomiting or diarrhea. He is unaware of any sick contacts. He is on round 5 of chemo. He is on Bactrim chronically for UTI prevention due to bladder atony and self-catheterizations. He has not tried anything for his symptoms other than his daily Zyrtec for allergies. Last CBC was normal in October.  Plan: -Encouraged patient to use flonase -Continue Zyrtec -Mucinex BID -Check CBC for neutropenia, if neutropenic, start antibiotics

## 2015-08-26 ENCOUNTER — Other Ambulatory Visit: Payer: Self-pay | Admitting: Internal Medicine

## 2015-08-26 DIAGNOSIS — J069 Acute upper respiratory infection, unspecified: Secondary | ICD-10-CM

## 2015-08-26 LAB — CBC WITH DIFFERENTIAL/PLATELET
BASOS ABS: 0.1 10*3/uL (ref 0.0–0.2)
Basos: 1 %
EOS (ABSOLUTE): 0.1 10*3/uL (ref 0.0–0.4)
EOS: 3 %
HEMATOCRIT: 39 % (ref 37.5–51.0)
Hemoglobin: 13.2 g/dL (ref 12.6–17.7)
Immature Grans (Abs): 0 10*3/uL (ref 0.0–0.1)
Immature Granulocytes: 0 %
LYMPHS ABS: 1.6 10*3/uL (ref 0.7–3.1)
Lymphs: 32 %
MCH: 34.5 pg — AB (ref 26.6–33.0)
MCHC: 33.8 g/dL (ref 31.5–35.7)
MCV: 102 fL — ABNORMAL HIGH (ref 79–97)
MONOS ABS: 0.5 10*3/uL (ref 0.1–0.9)
Monocytes: 10 %
NEUTROS ABS: 2.8 10*3/uL (ref 1.4–7.0)
Neutrophils: 54 %
Platelets: 246 10*3/uL (ref 150–379)
RBC: 3.83 x10E6/uL — AB (ref 4.14–5.80)
RDW: 17.2 % — AB (ref 12.3–15.4)
WBC: 5.1 10*3/uL (ref 3.4–10.8)

## 2015-08-26 NOTE — Progress Notes (Signed)
Medicine attending: Medical history, presenting problems, physical findings, and medications, reviewed with Dr Alexa Richardson on the day of the patient visit and I concur with her evaluation and management plan. 

## 2015-08-26 NOTE — Telephone Encounter (Signed)
It has been ready at the pharm since yesterday, called got his vmail, lm to call pharm

## 2015-08-26 NOTE — Telephone Encounter (Signed)
Pt requesting guaifenesin to be filled @ [facility] on [address].

## 2015-08-29 ENCOUNTER — Telehealth: Payer: Self-pay | Admitting: Internal Medicine

## 2015-08-29 NOTE — Telephone Encounter (Signed)
Pt states he can not afford guaifenesin. Please call pt back.

## 2015-08-29 NOTE — Telephone Encounter (Signed)
Left msg for pt to call me back

## 2015-08-31 ENCOUNTER — Encounter: Payer: Self-pay | Admitting: Hematology

## 2015-08-31 ENCOUNTER — Ambulatory Visit (HOSPITAL_BASED_OUTPATIENT_CLINIC_OR_DEPARTMENT_OTHER): Payer: Medicare Other | Admitting: Hematology

## 2015-08-31 ENCOUNTER — Telehealth: Payer: Self-pay | Admitting: Hematology

## 2015-08-31 ENCOUNTER — Other Ambulatory Visit (HOSPITAL_BASED_OUTPATIENT_CLINIC_OR_DEPARTMENT_OTHER): Payer: Medicare Other

## 2015-08-31 VITALS — BP 145/81 | HR 48 | Temp 97.4°F | Resp 18 | Ht 71.0 in | Wt 183.4 lb

## 2015-08-31 DIAGNOSIS — C187 Malignant neoplasm of sigmoid colon: Secondary | ICD-10-CM

## 2015-08-31 DIAGNOSIS — R197 Diarrhea, unspecified: Secondary | ICD-10-CM | POA: Diagnosis not present

## 2015-08-31 DIAGNOSIS — I10 Essential (primary) hypertension: Secondary | ICD-10-CM

## 2015-08-31 DIAGNOSIS — G622 Polyneuropathy due to other toxic agents: Secondary | ICD-10-CM

## 2015-08-31 DIAGNOSIS — L271 Localized skin eruption due to drugs and medicaments taken internally: Secondary | ICD-10-CM

## 2015-08-31 DIAGNOSIS — G620 Drug-induced polyneuropathy: Secondary | ICD-10-CM | POA: Insufficient documentation

## 2015-08-31 DIAGNOSIS — N312 Flaccid neuropathic bladder, not elsewhere classified: Secondary | ICD-10-CM | POA: Diagnosis not present

## 2015-08-31 DIAGNOSIS — T451X5A Adverse effect of antineoplastic and immunosuppressive drugs, initial encounter: Secondary | ICD-10-CM

## 2015-08-31 DIAGNOSIS — C189 Malignant neoplasm of colon, unspecified: Secondary | ICD-10-CM

## 2015-08-31 LAB — COMPREHENSIVE METABOLIC PANEL (CC13)
ANION GAP: 8 meq/L (ref 3–11)
AST: 15 U/L (ref 5–34)
Albumin: 3.6 g/dL (ref 3.5–5.0)
Alkaline Phosphatase: 70 U/L (ref 40–150)
BILIRUBIN TOTAL: 0.74 mg/dL (ref 0.20–1.20)
BUN: 16.1 mg/dL (ref 7.0–26.0)
CO2: 27 meq/L (ref 22–29)
CREATININE: 1.2 mg/dL (ref 0.7–1.3)
Calcium: 9.2 mg/dL (ref 8.4–10.4)
Chloride: 105 mEq/L (ref 98–109)
EGFR: 70 mL/min/{1.73_m2} — ABNORMAL LOW (ref >90)
GLUCOSE: 84 mg/dL (ref 70–140)
Potassium: 4.8 mEq/L (ref 3.5–5.1)
SODIUM: 140 meq/L (ref 136–145)
TOTAL PROTEIN: 7 g/dL (ref 6.4–8.3)

## 2015-08-31 LAB — CBC WITH DIFFERENTIAL/PLATELET
BASO%: 1.2 % (ref 0.0–2.0)
Basophils Absolute: 0.1 10*3/uL (ref 0.0–0.1)
EOS%: 3.1 % (ref 0.0–7.0)
Eosinophils Absolute: 0.2 10*3/uL (ref 0.0–0.5)
HCT: 40.4 % (ref 38.4–49.9)
HEMOGLOBIN: 13.5 g/dL (ref 13.0–17.1)
LYMPH%: 24.1 % (ref 14.0–49.0)
MCH: 35 pg — ABNORMAL HIGH (ref 27.2–33.4)
MCHC: 33.3 g/dL (ref 32.0–36.0)
MCV: 105.3 fL — ABNORMAL HIGH (ref 79.3–98.0)
MONO#: 0.5 10*3/uL (ref 0.1–0.9)
MONO%: 10.4 % (ref 0.0–14.0)
NEUT%: 61.2 % (ref 39.0–75.0)
NEUTROS ABS: 3.1 10*3/uL (ref 1.5–6.5)
PLATELETS: 209 10*3/uL (ref 140–400)
RBC: 3.84 10*6/uL — AB (ref 4.20–5.82)
RDW: 18.4 % — ABNORMAL HIGH (ref 11.0–14.6)
WBC: 5.1 10*3/uL (ref 4.0–10.3)
lymph#: 1.2 10*3/uL (ref 0.9–3.3)

## 2015-08-31 NOTE — Progress Notes (Signed)
[place]  [phone] [phone]  Clinic Follow up Note   Patient Care Team: Norman Herrlich, MD as PCP - General Judeth Horn, MD as Consulting Physician (General Surgery) Truitt Merle, MD as Consulting Physician (Hematology) 08/31/2015  CHIEF COMPLAINTS:  Follow up stage III colon cancer   Oncology History   Colon adenocarcinoma   Staging form: Colon and Rectum, AJCC 7th Edition     Pathologic stage from 03/12/2015: Stage IIIB (T4a, N1c, cM0) - Signed by Truitt Merle, MD on 04/05/2015        Colon adenocarcinoma ([place])   02/27/2015 Imaging CT abdomen and pelvis showed an annular constricting lesion within the sigmoid colon, no significant adenopathy or distant metastasis. CT abdomen and pelvis on 03/11/2015 showed similar findings with obstruction.   03/12/2015 Initial Diagnosis Sigmoid colon adenocarcinoma   03/12/2015 Tumor Marker CEA normal 2.5   03/12/2015 Surgery Sigmoid colon segmental resection by Dr. Excell Seltzer, surgical margins were negative.   03/12/2015 Pathology Results Well differentiated invasive adenocarcinoma with associated abundant mucin, 14 lymph nodes were negative, a tumor deposit was present, surgical margins were negative. pT4N1c   04/26/2015 -  Chemotherapy Capecitabine po 2016m q12h, 2 weeks on, one week off, as adjuvant chemo     HISTORY OF PRESENTING ILLNESS:  Greg [name] 74y.o. male is here because of stage III colon cancer. He was referred after his recent hospitalization.   He has been having abdominal discomfort and nausea for the past few months, along with some blood in his stool. No significant weight loss or change of his appetite. He presented to the ED 02/27/15 for abdominal pain with x-ray showing possible obstruction but CT scan negative for obstruction or ileus but it did indicate an annular constricting lesion in the sigmoid. He was seen by GI Dr OPaulita Fujitaper colonoscopy failed due to the poor prep. There is warranted.  He knows what He presented to ED again on 03/12/2015. CT abd/pelvis showed annular constricting lesion within the sigmoid colon with markedly dilated colon proximal to this lesion with air-fluid levels, compatible with distal colonic obstructing lesion concerning for malignancy as well as tiny hypodensities scattered throughout the liver, nonspecific but favor small cysts. General surgery was called and performed sigmoid colectomy with colostomy on 03/12/2015 by Dr. WHulen Skains The patient's post-operative course was complicated only by hypokalemia which was repeatedly replenished with IV and oral KCl. He received IV hydration and pain management and his diet was slowly advanced as tolerated. Physical and occupational therapy saw the patient and made recommendations. He was discharged to home with home health PT and ostomy care on 03/19/2015.   He has been recovering well from the surgery. He has no significant pain at the surgical site, no nausea, normal stool output in the colostomy bag. He has a mild to moderate fatigue, able to take care of himself and tolerating daily activities. He lives alone, uses walker as needed. He feels his energy level has been recovered well. He has a brother who lives for 5 blocks away, and helps him out when he needs.  CURRENT THERAPY: adjuvant Xeloda 2000 mg every 12 hours, 2 weeks on, one-week off, started on 04/26/2015  INTERIM HISTORY: [place] for follow up. He will finish cycle 6 Xeloda tomorrow. She has been tolerating well overall. She does have slightly worsening dry skin, with thickening, mild peeling and pigmentation on hands and feet, mild neuropathy stable. She has no difficulty with her hand functions. He has mild  diarrhea, controlled by Imodium. His fatigue is slightly worse than before, but able to tolerate daily routine activities without much difficulty. No fever or chills, no other complaints.   MEDICAL HISTORY:  Past Medical History  Diagnosis Date  .  Hyperlipidemia   . Hypertension   . Epididymitis     right  . Atony of bladder     followed at [facility]; CIC 3x/day, has been treated for culture proven serratia marcesens 05/2011, 06/2011, 07/2011, 09/2011  . [name] (benign prostatic hyperplasia)     hyposensitive bladder, nodular prostate with increased PSA (10.23 in 03/2006), s/p prostate biopsy 2006 that was normal (Dr Jeffie Pollock) he recommended TURP.  . Sinus bradycardia   . Chronic knee pain     left  . Gingivitis   . Right shoulder pain     sx in 06/2005  . Frozen shoulder     left frozen shoulder  . Shingles   . Cancer [facility])     SURGICAL HISTORY: Past Surgical History  Procedure Laterality Date  . Vasectomy    . Hydrocelectomy  11/14/2010    left hydrocelectomy  . Right shoulder arthroscopic surgery  06/2005  . Transurethral resection of prostate      status post TURP 2/2 [name] in 2008  . Knee surgery      L knee  . Laparotomy N/A 03/12/2015    Procedure: EXPLORATORY LAPAROTOMY;  Surgeon: Judeth Horn, MD;  Location: [place];  Service: General;  Laterality: N/A;  . Colostomy revision N/A 03/12/2015    Procedure:  RESECTION SIGMOID COLON;  Surgeon: Judeth Horn, MD;  Location: [place];  Service: General;  Laterality: N/A;  . Colostomy N/A 03/12/2015    Procedure: COLOSTOMY;  Surgeon: Judeth Horn, MD;  Location: [place];  Service: General;  Laterality: N/A;    SOCIAL HISTORY: Social History   Social History  . Marital Status: Single    Spouse Name: N/A  . Number of Children: N/A  . Years of Education: 12   Occupational History  .      retired   Social History Main Topics  . Smoking status: Former Smoker -- 0.50 packs/day for 25 years    Types: Cigarettes    Quit date: 02/07/1988  . Smokeless tobacco: Not on file  . Alcohol Use: 0.0 oz/week    0 Standard drinks or equivalent per week     Comment: occasional   . Drug Use: No  . Sexual Activity: Not on file   Other Topics Concern  . Not on file   Social History  Narrative   Single, lives alone in apartment   No children   Several local siblings in area-will take him to grocery store, etc   Rides bus (75cents/trip) and bus is right outside his house   Prior employment [employer]   Reports strong faith helps him cope with life   Has had to self-cath twice daily since 2008 -Dr. Jeffie Pollock   [facility] for ostomy care and supplies   Ambulates w/walker-independent in ADL's-cooks pot pies and frozen dinners    FAMILY HISTORY: Family History  Problem Relation Age of Onset  . Cancer Father     unknown type cancer     ALLERGIES:  is allergic to ciprofloxacin hcl and ibuprofen.  MEDICATIONS:  Current Outpatient Prescriptions  Medication Sig Dispense Refill  . capecitabine (XELODA) 500 MG tablet Take 4 tablets (2,000 mg total) by mouth 2 (two) times daily after a meal. 112 tablet  4  . fluticasone (FLONASE) 50 MCG/ACT nasal spray Place 1 spray into both nostrils daily. 16 g 2  . gabapentin (NEURONTIN) 100 MG capsule Take 1-2 capsules (100-200 mg total) by mouth at bedtime. 60 capsule 1  . guaiFENesin (MUCINEX) 600 MG 12 hr tablet Take 1 tablet (600 mg total) by mouth 2 (two) times daily. 30 tablet 0  . nitrofurantoin (MACRODANTIN) 100 MG capsule     . pravastatin (PRAVACHOL) 20 MG tablet Take 1 tablet (20 mg total) by mouth daily. 90 tablet 4  . sulfamethoxazole-trimethoprim (BACTRIM DS,SEPTRA DS) 800-160 MG tablet     . traMADol (ULTRAM) 50 MG tablet     . cetirizine (ZYRTEC) 10 MG tablet TK 1 T PO D  2   No current facility-administered medications for this visit.    REVIEW OF SYSTEMS:   Constitutional: Denies fevers, chills or abnormal night sweats Eyes: Denies blurriness of vision, double vision or watery eyes Ears, nose, mouth, throat, and face: Denies mucositis or sore throat Respiratory: Denies cough, dyspnea or wheezes Cardiovascular: Denies palpitation, chest discomfort or lower extremity swelling Gastrointestinal:  Denies  nausea, heartburn or change in bowel habits Skin: Denies abnormal skin rashes Lymphatics: Denies new lymphadenopathy or easy bruising Neurological:Denies numbness, tingling or new weaknesses Behavioral/Psych: Mood is stable, no new changes  All other systems were reviewed with the patient and are negative.  PHYSICAL EXAMINATION: ECOG PERFORMANCE STATUS: 1 - Symptomatic but completely ambulatory  Filed Vitals:   08/31/15 1020  BP: 145/81  Pulse: 48  Temp: 97.4 F (36.3 C)  Resp: 18   Filed Weights   08/31/15 1020  Weight: 183 lb 6.4 oz (83.19 kg)    GENERAL:alert, no distress and comfortable SKIN: skin color, texture, turgor are normal, significant skin pigmentation, thickening, and mild skin peeling on the palms and feet EYES: normal, conjunctiva are pink and non-injected, sclera clear OROPHARYNX:no exudate, no erythema and lips, buccal mucosa, and tongue normal  NECK: supple, thyroid normal size, non-tender, without nodularity LYMPH:  no palpable lymphadenopathy in the cervical, axillary or inguinal LUNGS: clear to auscultation and percussion with normal breathing effort HEART: regular rate & rhythm and no murmurs and no lower extremity edema ABDOMEN:abdomen soft, non-tender and normal bowel sounds, surgical incision is well healed, (+) colostomy bag in the left abdomen. Musculoskeletal:no cyanosis of digits and no clubbing  PSYCH: alert & oriented x 3 with fluent speech NEURO: no focal motor/sensory deficits  LABORATORY DATA:  I have reviewed the data as listed  CBC Latest Ref Rng 08/31/2015 08/25/2015 08/10/2015  WBC 4.0 - 10.3 10e3/uL 5.1 5.1 5.4  Hemoglobin 13.0 - 17.1 g/dL 13.5 - 13.5  Hematocrit 38.4 - 49.9 % 40.4 39.0 40.1  Platelets 140 - 400 10e3/uL 209 - 209    CMP Latest Ref Rng 08/31/2015 08/10/2015 07/14/2015  Glucose 70 - 140 mg/dl 84 99 99  BUN 7.0 - 26.0 mg/dL 16.1 15.8 20.7  Creatinine 0.7 - 1.3 mg/dL 1.2 1.1 1.0  Sodium 136 - 145 mEq/L 140 139 138    Potassium 3.5 - 5.1 mEq/L 4.8 4.3 4.0  Chloride 101 - 111 mmol/L - - -  CO2 22 - 29 mEq/L _0 Calcium 8.4 - 10.4 mg/dL 9.2 9.0 8.3(L)  Total Protein 6.4 - 8.3 g/dL 7.0 7.1 6.6  Total Bilirubin 0.20 - 1.20 mg/dL 0.74 1.02 0.71  Alkaline Phos 40 - 150 U/L 70 69 66  AST 5 - 34 U/L _1 ALT 0 -  55 U/L <9 <9 <9      CEA  Status: Finalresult Visible to patient:  Not Released Nextappt: Today at 11:00 AM in Oncology ([place])          Ref Range 3wk ago    CEA 0.0 - 4.7 ng/mL 2.5         PATHOLOGY REPORT  Diagnosis 03/12/2015 Colon, segmental resection for tumor, sigmoid colon - INVASIVE WELL-DIFFERENTIATED ADENOCARCINOMA WITH ASSOCIATED ABUNDANT MUCIN, SPANNING 7 CM IN GREATEST DIMENSION. 1 of 4 - TUMOR INVADES THROUGH MUSCULARIS PROPRIA THROUGH PERICOLORECTAL SOFT TISSUES TO INVOLVE SEROSAL SURFACE. - MARGINS ARE NEGATIVE. - FOURTEEN BENIGN LYMPH NODES WITH NO TUMOR SEEN (0/14). - SINGLE SOFT TISSUE SATELLITE  Microscopic Comment COLON AND RECTUM: Specimen: Sigmoid colon. Procedure: Resection of sigmoid colon. Tumor site: Sigmoid colon, distal aspect of specimen. Specimen integrity: Intact.  Macroscopic tumor perforation: Not identified. Invasive tumor: Maximum size: 7 cm. Histologic type(s): Invasive adenocarcinoma with abundant extracellular mucin. Histologic grade and differentiation: G1: well differentiated/low grade Type of polyp in which invasive carcinoma arose: Precursor polyp is not identified. Microscopic extension of invasive tumor: Tumor invades through muscularis propria through pericolorectal soft tissues to involve serosal surface. Lymph-Vascular invasion: Definitive lymph/vascular invasion is not identified; however, a tumor deposit is present, see below. Peri-neural invasion: Not identified. Tumor deposit(s) (discontinuous extramural extension): Yes, tumor deposit present. Resection margins: Proximal margin:  Negative. Distal margin: Negative. Mesenteric margin (sigmoid and transverse): Negative. Distance closest margin (if all above margins negative): 3.5 cm (mesenteric margin). Treatment effect (neo-adjuvant therapy): Not applicable. Additional polyp(s): No additional polyps identified. Non-neoplastic findings: No significant non-neoplastic findings. Lymph nodes: number examined 14; number positive: 0. Pathologic Staging: pT4a, pN1c. Ancillary studies: Per colorectal cancer protocol, MMR by IHC and MSI by PCR will be performed on the current tumor. (RH:ecj 03/16/2015)  ADDITIONAL INFORMATION: Mismatch Repair (MMR) Protein Immunohistochemistry (IHC) IHC Expression Result: MLH1: Preserved nuclear expression (greater 50% tumor expression) MSH2: Preserved nuclear expression (greater 50% tumor expression) MSH6: Preserved nuclear expression (greater 50% tumor expression) PMS2: Preserved nuclear expression (greater 50% tumor expression) * Internal control demonstrates intact nuclear expression Interpretation: NORMAL    RADIOGRAPHIC STUDIES: I have personally reviewed the radiological images as listed and agreed with the findings in the report.  Ct Abdomen Pelvis W Contrast 03/11/2015    FINDINGS: Lower chest: Linear subsegmental atelectasis in the lung bases. No effusions. Heart is upper limits normal in size.  Hepatobiliary: Scattered small nonspecific hypodensities throughout the liver, stable. Gallbladder unremarkable. No biliary ductal dilatation.  Pancreas: No focal abnormality or ductal dilatation.  Spleen: No focal abnormality.  Normal size.  Adrenals/Urinary Tract: No focal renal or adrenal abnormality. No hydronephrosis. Urinary bladder is unremarkable.  Stomach/Bowel: The colon is dilated and fluid-filled with air-fluid levels. Stomach and small bowel are decompressed. The colon is dilated to an annular constricting lesion within the sigmoid colon.  Vascular/Lymphatic: No retroperitoneal or  mesenteric adenopathy. Aorta normal caliber.  Reproductive: No mass or other significant abnormality.  Other: No free fluid or free air.  Musculoskeletal: Degenerative changes in the lumbar spine. No acute findings.   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 Findings compatible with distal colonic obstructing lesion concerning for malignancy.  Tiny hypodensities scattered throughout the liver, nonspecific, favor small cysts.   Electronically Signed   By: Rolm Baptise M.D.   On: 03/11/2015 21:17    ASSESSMENT & PLAN: 74 year old African-American male  1. Sigmoid colon cancer, pT4N1cM0, stage IIIB, grade 1, MMR normal -I reviewed his CT abdomen and pelvis findings, and his surgical pathology results in great details with the patient. -Giving the advanced stage IIIB disease, he does have moderate to high risk of cancer recurrence after surgery. -We discussed the role of adjuvant chemotherapy to reduce the risk of cancer recurrence, the standard is 5-FU or capecitabine along with oxaliplatin. Although he is 51, his general health and performance status is relatively good, I think he would be a candidate for FOLFOX. The benefits and risks of chemotherapy were discussed with patient, he agrees to proceed. Alternatively we can consider 5-FU or capecitabine alone, if he has difficulty tolerating FOLFOX. -He lateron changed his mind to single agent Xeloda. He is on cycle 6 now, tolerating well. We'll continue, total of 8 cycles(6 months) treatment -Lab reviewed, no significant cytopenia.  -Mild peripheral neuropathy, and skin changes, we'll continue monitoring closely -start cycle 7 on Monday 11/21  2. Diarrhea -Proberbly related to Xeloda, controlled  -I encouraged him to use Imodium as needed, and increase oral fluid intake to avoid dehydration  3. 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 Atony of bladder -He does straight cath twice a day, and on chronic Bactrim to prevent UTI. -He'll continue follow-up with his urologist. -We discussed the increased risk of UTI and other infection with chemotherapy  5. Hypertension, arthritis -He will continue follow-up with his primary care physician  6. Hand and foot syndrome -Secondary to Xeloda  -I encouraged him to use moisturizer, we'll continue watch.  Plan -start cycle 6 Xeloda next Monday 10/24 -RTC in 3-4 weeks before cycle 7   All questions were answered. The patient knows to call the clinic with any problems, questions or concerns. I spent 20 minutes counseling the patient face to face. The total time spent in the appointment was 25 minutes and more than 50% was on counseling.     Truitt Merle, MD 08/31/2015 9:58 PM

## 2015-08-31 NOTE — Telephone Encounter (Signed)
Gave patient avs report and appointments for December  °

## 2015-09-13 DIAGNOSIS — N312 Flaccid neuropathic bladder, not elsewhere classified: Secondary | ICD-10-CM | POA: Diagnosis not present

## 2015-09-13 DIAGNOSIS — N3020 Other chronic cystitis without hematuria: Secondary | ICD-10-CM | POA: Diagnosis not present

## 2015-09-30 ENCOUNTER — Telehealth: Payer: Self-pay | Admitting: Hematology

## 2015-09-30 ENCOUNTER — Ambulatory Visit (HOSPITAL_BASED_OUTPATIENT_CLINIC_OR_DEPARTMENT_OTHER): Payer: Medicare Other | Admitting: Hematology

## 2015-09-30 ENCOUNTER — Encounter: Payer: Self-pay | Admitting: Hematology

## 2015-09-30 ENCOUNTER — Other Ambulatory Visit (HOSPITAL_BASED_OUTPATIENT_CLINIC_OR_DEPARTMENT_OTHER): Payer: Medicare Other

## 2015-09-30 VITALS — BP 139/73 | HR 52 | Temp 98.0°F | Resp 18 | Ht 71.0 in | Wt 182.7 lb

## 2015-09-30 DIAGNOSIS — L271 Localized skin eruption due to drugs and medicaments taken internally: Secondary | ICD-10-CM

## 2015-09-30 DIAGNOSIS — I10 Essential (primary) hypertension: Secondary | ICD-10-CM | POA: Diagnosis not present

## 2015-09-30 DIAGNOSIS — C189 Malignant neoplasm of colon, unspecified: Secondary | ICD-10-CM

## 2015-09-30 DIAGNOSIS — R197 Diarrhea, unspecified: Secondary | ICD-10-CM

## 2015-09-30 DIAGNOSIS — N312 Flaccid neuropathic bladder, not elsewhere classified: Secondary | ICD-10-CM

## 2015-09-30 DIAGNOSIS — G620 Drug-induced polyneuropathy: Secondary | ICD-10-CM

## 2015-09-30 DIAGNOSIS — M1990 Unspecified osteoarthritis, unspecified site: Secondary | ICD-10-CM | POA: Diagnosis not present

## 2015-09-30 DIAGNOSIS — C187 Malignant neoplasm of sigmoid colon: Secondary | ICD-10-CM

## 2015-09-30 LAB — CBC WITH DIFFERENTIAL/PLATELET
BASO%: 1.1 % (ref 0.0–2.0)
Basophils Absolute: 0.1 10*3/uL (ref 0.0–0.1)
EOS%: 2.9 % (ref 0.0–7.0)
Eosinophils Absolute: 0.2 10*3/uL (ref 0.0–0.5)
HEMATOCRIT: 40.2 % (ref 38.4–49.9)
HGB: 13.3 g/dL (ref 13.0–17.1)
LYMPH#: 1.2 10*3/uL (ref 0.9–3.3)
LYMPH%: 22.1 % (ref 14.0–49.0)
MCH: 35.9 pg — ABNORMAL HIGH (ref 27.2–33.4)
MCHC: 33.2 g/dL (ref 32.0–36.0)
MCV: 108.1 fL — ABNORMAL HIGH (ref 79.3–98.0)
MONO#: 0.5 10*3/uL (ref 0.1–0.9)
MONO%: 9.2 % (ref 0.0–14.0)
NEUT%: 64.7 % (ref 39.0–75.0)
NEUTROS ABS: 3.7 10*3/uL (ref 1.5–6.5)
Platelets: 223 10*3/uL (ref 140–400)
RBC: 3.71 10*6/uL — ABNORMAL LOW (ref 4.20–5.82)
RDW: 17.6 % — AB (ref 11.0–14.6)
WBC: 5.6 10*3/uL (ref 4.0–10.3)

## 2015-09-30 LAB — COMPREHENSIVE METABOLIC PANEL
ALBUMIN: 3.6 g/dL (ref 3.5–5.0)
ALK PHOS: 71 U/L (ref 40–150)
AST: 16 U/L (ref 5–34)
Anion Gap: 10 mEq/L (ref 3–11)
BUN: 18.1 mg/dL (ref 7.0–26.0)
CALCIUM: 8.9 mg/dL (ref 8.4–10.4)
CO2: 21 mEq/L — ABNORMAL LOW (ref 22–29)
CREATININE: 1.1 mg/dL (ref 0.7–1.3)
Chloride: 109 mEq/L (ref 98–109)
EGFR: 72 mL/min/{1.73_m2} — ABNORMAL LOW (ref >90)
GLUCOSE: 90 mg/dL (ref 70–140)
POTASSIUM: 3.8 meq/L (ref 3.5–5.1)
SODIUM: 139 meq/L (ref 136–145)
Total Bilirubin: 0.82 mg/dL (ref 0.20–1.20)
Total Protein: 7.2 g/dL (ref 6.4–8.3)

## 2015-09-30 MED ORDER — UREA 10 % EX CREA: TOPICAL_CREAM | Freq: Three times a day (TID) | CUTANEOUS | Status: DC

## 2015-09-30 NOTE — Progress Notes (Signed)
Pt here for office visit.  Gave pt 5 bottles of Boost supplement as per Ottis Stain since dietitian Raford Pitcher not available.

## 2015-09-30 NOTE — Progress Notes (Signed)
[place]  Telephone:([phone]) [phone] Fax:(336) [phone]  Clinic Follow up Note   Patient Care Team: Norman Herrlich, MD as PCP - General Judeth Horn, MD as Consulting Physician (General Surgery) Truitt Merle, MD as Consulting Physician (Hematology) Irine Seal, MD as Attending Physician (Urology) 09/30/2015  CHIEF COMPLAINTS:  Follow up stage III colon cancer   Oncology History   Colon adenocarcinoma   Staging form: Colon and Rectum, AJCC 7th Edition     Pathologic stage from 03/12/2015: Stage IIIB (T4a, N1c, cM0) - Signed by Truitt Merle, MD on 04/05/2015        Cancer of sigmoid colon ([place])   02/27/2015 Imaging CT abdomen and pelvis showed an annular constricting lesion within the sigmoid colon, no significant adenopathy or distant metastasis. CT abdomen and pelvis on 03/11/2015 showed similar findings with obstruction.   03/12/2015 Initial Diagnosis Sigmoid colon adenocarcinoma   03/12/2015 Tumor Marker CEA normal 2.5   03/12/2015 Surgery Sigmoid colon segmental resection by Dr. Excell Seltzer, surgical margins were negative.   03/12/2015 Pathology Results Well differentiated invasive adenocarcinoma with associated abundant mucin, 14 lymph nodes were negative, a tumor deposit was present, surgical margins were negative. pT4N1c   04/26/2015 -  Chemotherapy Capecitabine po $RemoveBeforeD'2000mg'[id number]$  q12h, 2 weeks on, one week off, as adjuvant chemo     HISTORY OF PRESENTING ILLNESS:  Iseah Crean 74 y.o. male is here because of stage III colon cancer. He was referred after his recent hospitalization.   He has been having abdominal discomfort and nausea for the past few months, along with some blood in his stool. No significant weight loss or change of his appetite. He presented to the ED 02/27/15 for abdominal pain with x-ray showing possible obstruction but CT scan negative for obstruction or ileus but it did indicate an annular constricting lesion in the sigmoid. He was seen by GI Dr Paulita Fujita per  colonoscopy failed due to the poor prep. There is warranted. He knows what He presented to ED again on 03/12/2015. CT abd/pelvis showed annular constricting lesion within the sigmoid colon with markedly dilated colon proximal to this lesion with air-fluid levels, compatible with distal colonic obstructing lesion concerning for malignancy as well as tiny hypodensities scattered throughout the liver, nonspecific but favor small cysts. General surgery was called and performed sigmoid colectomy with colostomy on 03/12/2015 by Dr. Hulen Skains. The patient's post-operative course was complicated only by hypokalemia which was repeatedly replenished with IV and oral KCl. He received IV hydration and pain management and his diet was slowly advanced as tolerated. Physical and occupational therapy saw the patient and made recommendations. He was discharged to home with home health PT and ostomy care on 03/19/2015.   He has been recovering well from the surgery. He has no significant pain at the surgical site, no nausea, normal stool output in the colostomy bag. He has a mild to moderate fatigue, able to take care of himself and tolerating daily activities. He lives alone, uses walker as needed. He feels his energy level has been recovered well. He has a brother who lives for 5 blocks away, and helps him out when he needs.  CURRENT THERAPY: adjuvant Xeloda 2000 mg every 12 hours, 2 weeks on, one-week off, started on 04/26/2015  INTERIM HISTORY: Alexey returns for follow up. He finished cycle 7 Xeloda 2 days ago. He has been tolerating treatment very well, no significant fatigue, nausea, appetite change or other symptoms. He drinks Ensure some time. His only complaint is his skin  thickness on both palms, mild skin peeling, and sensitivity on the fingertips. His hand function is fine. He has good energy level, doing well at home.   MEDICAL HISTORY:  Past Medical History  Diagnosis Date  . Hyperlipidemia   . Hypertension    . Epididymitis     right  . Atony of bladder     followed at [facility]; CIC 3x/day, has been treated for culture proven serratia marcesens 05/2011, 06/2011, 07/2011, 09/2011  . [name] (benign prostatic hyperplasia)     hyposensitive bladder, nodular prostate with increased PSA (10.23 in 03/2006), s/p prostate biopsy 2006 that was normal (Dr Jeffie Pollock) he recommended TURP.  . Sinus bradycardia   . Chronic knee pain     left  . Gingivitis   . Right shoulder pain     sx in 06/2005  . Frozen shoulder     left frozen shoulder  . Shingles   . Cancer [facility])     SURGICAL HISTORY: Past Surgical History  Procedure Laterality Date  . Vasectomy    . Hydrocelectomy  11/14/2010    left hydrocelectomy  . Right shoulder arthroscopic surgery  06/2005  . Transurethral resection of prostate      status post TURP 2/2 [name] in 2008  . Knee surgery      L knee  . Laparotomy N/A 03/12/2015    Procedure: EXPLORATORY LAPAROTOMY;  Surgeon: Judeth Horn, MD;  Location: [place];  Service: General;  Laterality: N/A;  . Colostomy revision N/A 03/12/2015    Procedure:  RESECTION SIGMOID COLON;  Surgeon: Judeth Horn, MD;  Location: [place];  Service: General;  Laterality: N/A;  . Colostomy N/A 03/12/2015    Procedure: COLOSTOMY;  Surgeon: Judeth Horn, MD;  Location: [place];  Service: General;  Laterality: N/A;    SOCIAL HISTORY: Social History   Social History  . Marital Status: Single    Spouse Name: N/A  . Number of Children: N/A  . Years of Education: 12   Occupational History  .      retired   Social History Main Topics  . Smoking status: Former Smoker -- 0.50 packs/day for 25 years    Types: Cigarettes    Quit date: 02/07/1988  . Smokeless tobacco: Not on file  . Alcohol Use: 0.0 oz/week    0 Standard drinks or equivalent per week     Comment: occasional   . Drug Use: No  . Sexual Activity: Not on file   Other Topics Concern  . Not on file   Social History Narrative   Single, lives alone in  apartment   No children   Several local siblings in area-will take him to grocery store, etc   Rides bus (75cents/trip) and bus is right outside his house   Prior employment [employer]   Reports strong faith helps him cope with life   Has had to self-cath twice daily since 2008 -Dr. Jeffie Pollock   [facility] for ostomy care and supplies   Ambulates w/walker-independent in ADL's-cooks pot pies and frozen dinners    FAMILY HISTORY: Family History  Problem Relation Age of Onset  . Cancer Father     unknown type cancer     ALLERGIES:  is allergic to ciprofloxacin hcl and ibuprofen.  MEDICATIONS:  Current Outpatient Prescriptions  Medication Sig Dispense Refill  . cetirizine (ZYRTEC) 10 MG tablet TK 1 T PO D  2  . gabapentin (NEURONTIN) 100 MG capsule Take 1-2 capsules (100-200 mg  total) by mouth at bedtime. 60 capsule 1  . guaiFENesin (MUCINEX) 600 MG 12 hr tablet Take 1 tablet (600 mg total) by mouth 2 (two) times daily. 30 tablet 0  . pravastatin (PRAVACHOL) 20 MG tablet Take 1 tablet (20 mg total) by mouth daily. 90 tablet 4  . sulfamethoxazole-trimethoprim (BACTRIM DS,SEPTRA DS) 800-160 MG tablet     . traMADol (ULTRAM) 50 MG tablet     . capecitabine (XELODA) 500 MG tablet Take 4 tablets (2,000 mg total) by mouth 2 (two) times daily after a meal. (Patient not taking: Reported on 09/30/2015) 112 tablet 4  . fluticasone (FLONASE) 50 MCG/ACT nasal spray Place 1 spray into both nostrils daily. (Patient not taking: Reported on 09/30/2015) 16 g 2  . urea (CARMOL) 10 % cream Apply topically 3 (three) times daily. 71 g 0   No current facility-administered medications for this visit.    REVIEW OF SYSTEMS:   Constitutional: Denies fevers, chills or abnormal night sweats Eyes: Denies blurriness of vision, double vision or watery eyes Ears, nose, mouth, throat, and face: Denies mucositis or sore throat Respiratory: Denies cough, dyspnea or wheezes Cardiovascular: Denies palpitation,  chest discomfort or lower extremity swelling Gastrointestinal:  Denies nausea, heartburn or change in bowel habits Skin: see HPI  Lymphatics: Denies new lymphadenopathy or easy bruising Neurological:Denies numbness, tingling or new weaknesses Behavioral/Psych: Mood is stable, no new changes  All other systems were reviewed with the patient and are negative.  PHYSICAL EXAMINATION: ECOG PERFORMANCE STATUS: 1 - Symptomatic but completely ambulatory  Filed Vitals:   09/30/15 0954  BP: 139/73  Pulse: 52  Temp: 98 F (36.7 C)  Resp: 18   Filed Weights   09/30/15 0954  Weight: 182 lb 11.2 oz (82.872 kg)    GENERAL:alert, no distress and comfortable SKIN: skin color, texture, turgor are normal, significant skin pigmentation, thickening, and mild skin peeling on the palms and feet EYES: normal, conjunctiva are pink and non-injected, sclera clear OROPHARYNX:no exudate, no erythema and lips, buccal mucosa, and tongue normal  NECK: supple, thyroid normal size, non-tender, without nodularity LYMPH:  no palpable lymphadenopathy in the cervical, axillary or inguinal LUNGS: clear to auscultation and percussion with normal breathing effort HEART: regular rate & rhythm and no murmurs and no lower extremity edema ABDOMEN:abdomen soft, non-tender and normal bowel sounds, surgical incision is well healed, (+) colostomy bag in the left abdomen. Musculoskeletal:no cyanosis of digits and no clubbing  PSYCH: alert & oriented x 3 with fluent speech NEURO: no focal motor/sensory deficits  LABORATORY DATA:  I have reviewed the data as listed  CBC Latest Ref Rng 09/30/2015 08/31/2015 08/25/2015  WBC 4.0 - 10.3 10e3/uL 5.6 5.1 5.1  Hemoglobin 13.0 - 17.1 g/dL 13.3 13.5 -  Hematocrit 38.4 - 49.9 % 40.2 40.4 39.0  Platelets 140 - 400 10e3/uL 223 209 -    CMP Latest Ref Rng 09/30/2015 08/31/2015 08/10/2015  Glucose 70 - 140 mg/dl 90 84 99  BUN 7.0 - 26.0 mg/dL 18.1 16.1 15.8  Creatinine 0.7 - 1.3 mg/dL  1.1 1.2 1.1  Sodium 136 - 145 mEq/L 139 140 139  Potassium 3.5 - 5.1 mEq/L 3.8 4.8 4.3  Chloride 101 - 111 mmol/L - - -  CO2 22 - 29 mEq/L 21(L) 27 28  Calcium 8.4 - 10.4 mg/dL 8.9 9.2 9.0  Total Protein 6.4 - 8.3 g/dL 7.2 7.0 7.1  Total Bilirubin 0.20 - 1.20 mg/dL 0.82 0.74 1.02  Alkaline Phos 40 - 150 U/L 71  70 69  AST 5 - 34 U/L _0 ALT 0 - 55 U/L <9 <9 <9      CEA  Status: Finalresult Visible to patient:  Not Released Nextappt: Today at 11:00 AM in Oncology ([place])          Ref Range 3wk ago    CEA 0.0 - 4.7 ng/mL 2.5         PATHOLOGY REPORT  Diagnosis 03/12/2015 Colon, segmental resection for tumor, sigmoid colon - INVASIVE WELL-DIFFERENTIATED ADENOCARCINOMA WITH ASSOCIATED ABUNDANT MUCIN, SPANNING 7 CM IN GREATEST DIMENSION. 1 of 4 - TUMOR INVADES THROUGH MUSCULARIS PROPRIA THROUGH PERICOLORECTAL SOFT TISSUES TO INVOLVE SEROSAL SURFACE. - MARGINS ARE NEGATIVE. - FOURTEEN BENIGN LYMPH NODES WITH NO TUMOR SEEN (0/14). - SINGLE SOFT TISSUE SATELLITE  Microscopic Comment COLON AND RECTUM: Specimen: Sigmoid colon. Procedure: Resection of sigmoid colon. Tumor site: Sigmoid colon, distal aspect of specimen. Specimen integrity: Intact.  Macroscopic tumor perforation: Not identified. Invasive tumor: Maximum size: 7 cm. Histologic type(s): Invasive adenocarcinoma with abundant extracellular mucin. Histologic grade and differentiation: G1: well differentiated/low grade Type of polyp in which invasive carcinoma arose: Precursor polyp is not identified. Microscopic extension of invasive tumor: Tumor invades through muscularis propria through pericolorectal soft tissues to involve serosal surface. Lymph-Vascular invasion: Definitive lymph/vascular invasion is not identified; however, a tumor deposit is present, see below. Peri-neural invasion: Not identified. Tumor deposit(s) (discontinuous extramural extension): Yes, tumor deposit  present. Resection margins: Proximal margin: Negative. Distal margin: Negative. Mesenteric margin (sigmoid and transverse): Negative. Distance closest margin (if all above margins negative): 3.5 cm (mesenteric margin). Treatment effect (neo-adjuvant therapy): Not applicable. Additional polyp(s): No additional polyps identified. Non-neoplastic findings: No significant non-neoplastic findings. Lymph nodes: number examined 14; number positive: 0. Pathologic Staging: pT4a, pN1c. Ancillary studies: Per colorectal cancer protocol, MMR by IHC and MSI by PCR will be performed on the current tumor. (RH:ecj 03/16/2015)  ADDITIONAL INFORMATION: Mismatch Repair (MMR) Protein Immunohistochemistry (IHC) IHC Expression Result: MLH1: Preserved nuclear expression (greater 50% tumor expression) MSH2: Preserved nuclear expression (greater 50% tumor expression) MSH6: Preserved nuclear expression (greater 50% tumor expression) PMS2: Preserved nuclear expression (greater 50% tumor expression) * Internal control demonstrates intact nuclear expression Interpretation: NORMAL    RADIOGRAPHIC STUDIES: I have personally reviewed the radiological images as listed and agreed with the findings in the report.  Ct Abdomen Pelvis W Contrast 03/11/2015    FINDINGS: Lower chest: Linear subsegmental atelectasis in the lung bases. No effusions. Heart is upper limits normal in size.  Hepatobiliary: Scattered small nonspecific hypodensities throughout the liver, stable. Gallbladder unremarkable. No biliary ductal dilatation.  Pancreas: No focal abnormality or ductal dilatation.  Spleen: No focal abnormality.  Normal size.  Adrenals/Urinary Tract: No focal renal or adrenal abnormality. No hydronephrosis. Urinary bladder is unremarkable.  Stomach/Bowel: The colon is dilated and fluid-filled with air-fluid levels. Stomach and small bowel are decompressed. The colon is dilated to an annular constricting lesion within the sigmoid  colon.  Vascular/Lymphatic: No retroperitoneal or mesenteric adenopathy. Aorta normal caliber.  Reproductive: No mass or other significant abnormality.  Other: No free fluid or free air.  Musculoskeletal: Degenerative changes in the lumbar spine. No acute findings.   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 Findings compatible with distal colonic obstructing lesion concerning for malignancy.  Tiny hypodensities scattered throughout the liver, nonspecific, favor small cysts.   Electronically Signed   By: Rolm Baptise  M.D.   On: 03/11/2015 21:17    ASSESSMENT & PLAN: 74 year old African-American male  1. Sigmoid colon cancer, pT4N1cM0, stage IIIB, grade 1, MMR normal -I reviewed his CT abdomen and pelvis findings, and his surgical pathology results in great details with the patient. -Giving the advanced stage IIIB disease, he does have moderate to high risk of cancer recurrence after surgery. -He is currently on adjuvant Xeloda, tolerating very well. He will finish planned 6 months therapy this months. -Lab reviewed, no significant cytopenia.  -Mild peripheral neuropathy, and skin changes, we'll continue monitoring closely -start cycle 8 (last cycle)  on 10/06/15   2. Diarrhea -Proberbly related to Xeloda, controlled  -I encouraged him to use Imodium as needed, and increase oral fluid intake to avoid dehydration  3. 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 Atony of bladder -He does straight cath twice a day, and on chronic Bactrim to prevent UTI. -He'll continue follow-up with his urologist. -We discussed the increased risk of UTI and other infection with chemotherapy  5. Hypertension, arthritis -He will continue follow-up with his primary care physician  6. Hand and foot syndrome -Secondary to Xeloda    -I called in urea cream 10% for him today -I encouraged him to use moisturizer, we'll continue watch.  Plan -start cycle 7 Xeloda on 12/15 -RTC in 6 weeks for follow up -will contact Dr. Ralene Muskrat office for recent CT scan, to decide about his next restaging scan    All questions were answered. The patient knows to call the clinic with any problems, questions or concerns. I spent 20 minutes counseling the patient face to face. The total time spent in the appointment was 25 minutes and more than 50% was on counseling.     Truitt Merle, MD 09/30/2015 10:44 AM

## 2015-09-30 NOTE — Telephone Encounter (Signed)
Gave and printed appt sched and avs for pt for Jan 2017 °

## 2015-10-19 ENCOUNTER — Telehealth: Payer: Self-pay | Admitting: Internal Medicine

## 2015-10-19 NOTE — Telephone Encounter (Signed)
Call to patient to confirm appointment for 10/19/15 at 9:15 lmtcb

## 2015-10-20 ENCOUNTER — Ambulatory Visit (INDEPENDENT_AMBULATORY_CARE_PROVIDER_SITE_OTHER): Payer: Medicare Other | Admitting: Internal Medicine

## 2015-10-20 ENCOUNTER — Encounter: Payer: Self-pay | Admitting: Internal Medicine

## 2015-10-20 VITALS — BP 125/83 | HR 53 | Temp 97.6°F | Ht 71.0 in | Wt 182.7 lb

## 2015-10-20 DIAGNOSIS — Z87891 Personal history of nicotine dependence: Secondary | ICD-10-CM | POA: Diagnosis not present

## 2015-10-20 DIAGNOSIS — I10 Essential (primary) hypertension: Secondary | ICD-10-CM | POA: Diagnosis not present

## 2015-10-20 DIAGNOSIS — J069 Acute upper respiratory infection, unspecified: Secondary | ICD-10-CM

## 2015-10-20 MED ORDER — SALINE SPRAY 0.65 % NA SOLN: 1.0000 | NASAL | Status: DC | PRN

## 2015-10-20 MED ORDER — CETIRIZINE HCL 10 MG PO TABS: 10.0000 mg | ORAL_TABLET | Freq: Every day | ORAL | Status: DC | PRN

## 2015-10-20 NOTE — Assessment & Plan Note (Signed)
BP well controlled today without medications.

## 2015-10-20 NOTE — Progress Notes (Signed)
Case discussed with Dr. Gill soon after the resident saw the patient.  We reviewed the resident's history and exam and pertinent patient test results.  I agree with the assessment, diagnosis, and plan of care documented in the resident's note. 

## 2015-10-20 NOTE — Progress Notes (Signed)
Patient ID: Greg [name], male   DOB: 19-Jul-1941, 74 y.o.   MRN: [id number]     Subjective:   Patient ID: Greg [name] male    DOB: 04/12/1941 74 y.o.    MRN: [id number] Health Maintenance Due: There are no preventive care reminders to display for this patient.  _________________________________________________  HPI: Mr.[name] is a 74 y.o. male here for an an acute visit.  Pt has a PMH outlined below.  Please see problem-based charting assessment and plan for further status of patient's chronic medical problems addressed at today's visit.  PMH: Past Medical History  Diagnosis Date  . Hyperlipidemia   . Hypertension   . Epididymitis     right  . Atony of bladder     followed at [facility]; CIC 3x/day, has been treated for culture proven serratia marcesens 05/2011, 06/2011, 07/2011, 09/2011  . [name] (benign prostatic hyperplasia)     hyposensitive bladder, nodular prostate with increased PSA (10.23 in 03/2006), s/p prostate biopsy 2006 that was normal (Dr Jeffie Pollock) he recommended TURP.  . Sinus bradycardia   . Chronic knee pain     left  . Gingivitis   . Right shoulder pain     sx in 06/2005  . Frozen shoulder     left frozen shoulder  . Shingles   . Cancer [facility])     Medications: Current Outpatient Prescriptions on File Prior to Visit  Medication Sig Dispense Refill  . capecitabine (XELODA) 500 MG tablet Take 4 tablets (2,000 mg total) by mouth 2 (two) times daily after a meal. (Patient not taking: Reported on 09/30/2015) 112 tablet 4  . fluticasone (FLONASE) 50 MCG/ACT nasal spray Place 1 spray into both nostrils daily. (Patient not taking: Reported on 09/30/2015) 16 g 2  . gabapentin (NEURONTIN) 100 MG capsule Take 1-2 capsules (100-200 mg total) by mouth at bedtime. 60 capsule 1  . guaiFENesin (MUCINEX) 600 MG 12 hr tablet Take 1 tablet (600 mg total) by mouth 2 (two) times daily. 30 tablet 0  . pravastatin (PRAVACHOL) 20 MG tablet Take 1 tablet (20 mg total)  by mouth daily. 90 tablet 4  . sulfamethoxazole-trimethoprim (BACTRIM DS,SEPTRA DS) 800-160 MG tablet     . traMADol (ULTRAM) 50 MG tablet     . urea (CARMOL) 10 % cream Apply topically 3 (three) times daily. 71 g 0   No current facility-administered medications on file prior to visit.    Allergies: Allergies  Allergen Reactions  . Ciprofloxacin Hcl Other (See Comments)    Diarrhea   . Ibuprofen Other (See Comments)    REACTION: "not sure"    FH: Family History  Problem Relation Age of Onset  . Cancer Father     unknown type cancer     SH: Social History   Social History  . Marital Status: Single    Spouse Name: N/A  . Number of Children: N/A  . Years of Education: 12   Occupational History  .      retired   Social History Main Topics  . Smoking status: Former Smoker -- 0.50 packs/day for 25 years    Types: Cigarettes    Quit date: 02/07/1988  . Smokeless tobacco: None  . Alcohol Use: 0.0 oz/week    0 Standard drinks or equivalent per week     Comment: occasional   . Drug Use: No  . Sexual Activity: Not Asked   Other Topics Concern  . None   Social History Narrative  Single, lives alone in apartment   No children   Several local siblings in area-will take him to grocery store, etc   Rides bus (75cents/trip) and bus is right outside his house   Prior employment [employer]   Reports strong faith helps him cope with life   Has had to self-cath twice daily since 2008 -Dr. Jeffie Pollock   [facility] for ostomy care and supplies   Ambulates w/walker-independent in ADL's-cooks pot pies and frozen dinners    Review of Systems: Constitutional: Negative for fever, chills and weight loss.  Eyes: Negative for blurred vision.  Respiratory: +cough and neg shortness of breath.  Cardiovascular: Negative for chest pain, palpitations and +leg swelling.  Gastrointestinal: Negative for nausea, vomiting, abdominal pain, diarrhea, constipation and blood in stool.    Genitourinary: Negative for dysuria, urgency and frequency.  Musculoskeletal: Negative for myalgias and back pain.  Neurological: Negative for dizziness, weakness and headaches.     Objective:   Vital Signs: Filed Vitals:   10/20/15 0921  BP: 125/83  Pulse: 53  Temp: 97.6 F (36.4 C)  TempSrc: Oral  Height: 5\' 11"  (1.803 m)  Weight: 182 lb 11.2 oz (82.872 kg)  SpO2: 100%      BP Readings from Last 3 Encounters:  10/20/15 125/83  09/30/15 139/73  08/31/15 145/81    Physical Exam: Constitutional: Vital signs reviewed.  Patient is in NAD and cooperative with exam.  Head: Normocephalic and atraumatic. Eyes: EOMI, conjunctivae nl, no scleral icterus.  Ears: Cerumen impaction.   Nose: Scant amount of clear discharge. Throat: Mild posterior pharynx erythema without exudates.  Neck: Supple. Cardiovascular: RRR, no MRG. Pulmonary/Chest: normal effort, CTAB, no wheezes, rales, or rhonchi. Abdominal: Soft. NT/ND +BS. Neurological: A&O x3, cranial nerves II-XII are grossly intact, moving all extremities. Extremities: 2+DP b/l; no pitting edema. Skin: Warm, dry and intact. No rash.   Assessment & Plan:   Assessment and plan was discussed and formulated with my attending.

## 2015-10-20 NOTE — Patient Instructions (Addendum)
Thank you for your visit today.   Please return to the internal medicine clinic in as needed.  You likely have a viral illness.  Please try to use the flonase at least once daily. You may also use some nasal saline spray that you may purchase at your pharmacy--I have sent a prescription to the pharmacy for this.  Use this several times daily but be sure to use it before the flonase and then again a couple of hours after the flonase.   I have refilled your zyrtec.    Please be sure to bring all of your medications with you to every visit; this includes herbal supplements, vitamins, eye drops, and any over-the-counter medications.   Should you have any questions regarding your medications and/or any new or worsening symptoms, please be sure to call the clinic at [phone].   If you believe that you are suffering from a life threatening condition or one that may result in the loss of limb or function, then you should call 911 and proceed to the nearest Emergency Department.   Upper Respiratory Infection, Adult Most upper respiratory infections (URIs) are a viral infection of the air passages leading to the lungs. A URI affects the nose, throat, and upper air passages. The most common type of URI is nasopharyngitis and is typically referred to as "the common cold." URIs run their course and usually go away on their own. Most of the time, a URI does not require medical attention, but sometimes a bacterial infection in the upper airways can follow a viral infection. This is called a secondary infection. Sinus and middle ear infections are common types of secondary upper respiratory infections. Bacterial pneumonia can also complicate a URI. A URI can worsen asthma and chronic obstructive pulmonary disease (COPD). Sometimes, these complications can require emergency medical care and may be life threatening.  CAUSES Almost all URIs are caused by viruses. A virus is a type of germ and can spread from  one person to another.  RISKS FACTORS You may be at risk for a URI if:   You smoke.   You have chronic heart or lung disease.  You have a weakened defense (immune) system.   You are very young or very old.   You have nasal allergies or asthma.  You work in crowded or poorly ventilated areas.  You work in health care facilities or schools. SIGNS AND SYMPTOMS  Symptoms typically develop 2-3 days after you come in contact with a cold virus. Most viral URIs last 7-10 days. However, viral URIs from the influenza virus (flu virus) can last 14-18 days and are typically more severe. Symptoms may include:   Runny or stuffy (congested) nose.   Sneezing.   Cough.   Sore throat.   Headache.   Fatigue.   Fever.   Loss of appetite.   Pain in your forehead, behind your eyes, and over your cheekbones (sinus pain).  Muscle aches.  DIAGNOSIS  Your health care provider may diagnose a URI by:  Physical exam.  Tests to check that your symptoms are not due to another condition such as:  Strep throat.  Sinusitis.  Pneumonia.  Asthma. TREATMENT  A URI goes away on its own with time. It cannot be cured with medicines, but medicines may be prescribed or recommended to relieve symptoms. Medicines may help:  Reduce your fever.  Reduce your cough.  Relieve nasal congestion. HOME CARE INSTRUCTIONS   Take medicines only as directed by your health  care provider.   Gargle warm saltwater or take cough drops to comfort your throat as directed by your health care provider.  Use a warm mist humidifier or inhale steam from a shower to increase air moisture. This may make it easier to breathe.  Drink enough fluid to keep your urine clear or pale yellow.   Eat soups and other clear broths and maintain good nutrition.   Rest as needed.   Return to work when your temperature has returned to normal or as your health care provider advises. You may need to stay home  longer to avoid infecting others. You can also use a face mask and careful hand washing to prevent spread of the virus.  Increase the usage of your inhaler if you have asthma.   Do not use any tobacco products, including cigarettes, chewing tobacco, or electronic cigarettes. If you need help quitting, ask your health care provider. PREVENTION  The best way to protect yourself from getting a cold is to practice good hygiene.   Avoid oral or hand contact with people with cold symptoms.   Wash your hands often if contact occurs.  There is no clear evidence that vitamin C, vitamin E, echinacea, or exercise reduces the chance of developing a cold. However, it is always recommended to get plenty of rest, exercise, and practice good nutrition.  SEEK MEDICAL CARE IF:   You are getting worse rather than better.   Your symptoms are not controlled by medicine.   You have chills.  You have worsening shortness of breath.  You have brown or red mucus.  You have yellow or brown nasal discharge.  You have pain in your face, especially when you bend forward.  You have a fever.  You have swollen neck glands.  You have pain while swallowing.  You have white areas in the back of your throat. SEEK IMMEDIATE MEDICAL CARE IF:   You have severe or persistent:  Headache.  Ear pain.  Sinus pain.  Chest pain.  You have chronic lung disease and any of the following:  Wheezing.  Prolonged cough.  Coughing up blood.  A change in your usual mucus.  You have a stiff neck.  You have changes in your:  Vision.  Hearing.  Thinking.  Mood. MAKE SURE YOU:   Understand these instructions.  Will watch your condition.  Will get help right away if you are not doing well or get worse.   This information is not intended to replace advice given to you by your health care provider. Make sure you discuss any questions you have with your health care provider.   Document Released:  04/03/2001 Document Revised: 02/22/2015 Document Reviewed: 01/13/2014 Elsevier Interactive Patient Education [employer].

## 2015-10-20 NOTE — Assessment & Plan Note (Signed)
Pt has been experiencing 1 wk of rhinorrhea, sore throat, congestion, occasional productive cough, post-nasal drip, and chronic watery eyes and sneezing.  Denies HA, N/V, fever/chills, dyspnea, CP, or myalgias.  He has been taking zyrtec which helps somewhat but he ran out.  He has not been using flonase as prescribed previously when seen on 08/25/15 for the same symptoms.  He stated he doesn't like to put things in his nose.  I suggested that he try this as it is likely to improve his symptoms.  He also has not been using saline nasal spray.  We discussed the benefits of both of these medications and he seems willing to try this approach.   -refilled zyrtec -encouraged flonase use -discussed nasal saline spray use -f/u PRN

## 2015-10-25 DIAGNOSIS — Z933 Colostomy status: Secondary | ICD-10-CM | POA: Diagnosis not present

## 2015-10-25 DIAGNOSIS — C189 Malignant neoplasm of colon, unspecified: Secondary | ICD-10-CM | POA: Diagnosis not present

## 2015-11-18 ENCOUNTER — Other Ambulatory Visit (HOSPITAL_BASED_OUTPATIENT_CLINIC_OR_DEPARTMENT_OTHER): Payer: Medicare Other

## 2015-11-18 ENCOUNTER — Ambulatory Visit (HOSPITAL_BASED_OUTPATIENT_CLINIC_OR_DEPARTMENT_OTHER): Payer: Medicare Other | Admitting: Hematology

## 2015-11-18 ENCOUNTER — Telehealth: Payer: Self-pay | Admitting: Hematology

## 2015-11-18 ENCOUNTER — Encounter: Payer: Self-pay | Admitting: Hematology

## 2015-11-18 VITALS — BP 130/76 | HR 48 | Temp 98.0°F | Resp 18 | Wt 186.6 lb

## 2015-11-18 DIAGNOSIS — G622 Polyneuropathy due to other toxic agents: Secondary | ICD-10-CM | POA: Diagnosis not present

## 2015-11-18 DIAGNOSIS — M1990 Unspecified osteoarthritis, unspecified site: Secondary | ICD-10-CM

## 2015-11-18 DIAGNOSIS — N3289 Other specified disorders of bladder: Secondary | ICD-10-CM | POA: Diagnosis not present

## 2015-11-18 DIAGNOSIS — I10 Essential (primary) hypertension: Secondary | ICD-10-CM

## 2015-11-18 DIAGNOSIS — C187 Malignant neoplasm of sigmoid colon: Secondary | ICD-10-CM

## 2015-11-18 DIAGNOSIS — C189 Malignant neoplasm of colon, unspecified: Secondary | ICD-10-CM

## 2015-11-18 LAB — COMPREHENSIVE METABOLIC PANEL
ALBUMIN: 3.7 g/dL (ref 3.5–5.0)
ALK PHOS: 79 U/L (ref 40–150)
ALT: 9 U/L (ref 0–55)
AST: 14 U/L (ref 5–34)
Anion Gap: 6 mEq/L (ref 3–11)
BUN: 17.8 mg/dL (ref 7.0–26.0)
CHLORIDE: 106 meq/L (ref 98–109)
CO2: 27 mEq/L (ref 22–29)
Calcium: 9 mg/dL (ref 8.4–10.4)
Creatinine: 1.2 mg/dL (ref 0.7–1.3)
EGFR: 68 mL/min/{1.73_m2} — AB (ref >90)
GLUCOSE: 83 mg/dL (ref 70–140)
POTASSIUM: 4.3 meq/L (ref 3.5–5.1)
SODIUM: 139 meq/L (ref 136–145)
Total Bilirubin: 0.73 mg/dL (ref 0.20–1.20)
Total Protein: 7.5 g/dL (ref 6.4–8.3)

## 2015-11-18 LAB — CBC WITH DIFFERENTIAL/PLATELET
BASO%: 1.4 % (ref 0.0–2.0)
BASOS ABS: 0.1 10*3/uL (ref 0.0–0.1)
EOS ABS: 0.1 10*3/uL (ref 0.0–0.5)
EOS%: 2 % (ref 0.0–7.0)
HCT: 43.9 % (ref 38.4–49.9)
HEMOGLOBIN: 14.3 g/dL (ref 13.0–17.1)
LYMPH%: 25.8 % (ref 14.0–49.0)
MCH: 34.7 pg — AB (ref 27.2–33.4)
MCHC: 32.6 g/dL (ref 32.0–36.0)
MCV: 106.4 fL — AB (ref 79.3–98.0)
MONO#: 0.7 10*3/uL (ref 0.1–0.9)
MONO%: 12.7 % (ref 0.0–14.0)
NEUT#: 3.1 10*3/uL (ref 1.5–6.5)
NEUT%: 58.1 % (ref 39.0–75.0)
Platelets: 185 10*3/uL (ref 140–400)
RBC: 4.13 10*6/uL — ABNORMAL LOW (ref 4.20–5.82)
RDW: 15.2 % — AB (ref 11.0–14.6)
WBC: 5.3 10*3/uL (ref 4.0–10.3)
lymph#: 1.4 10*3/uL (ref 0.9–3.3)

## 2015-11-18 NOTE — Progress Notes (Signed)
[facility]  [phone] [phone]  Clinic Follow up Note   Patient Care Team: Norman Herrlich, MD as PCP - General Judeth Horn, MD as Consulting Physician (General Surgery) Truitt Merle, MD as Consulting Physician (Hematology) Irine Seal, MD as Attending Physician (Urology) 11/18/2015  CHIEF COMPLAINTS:  Follow up stage III colon cancer   Oncology History   Colon adenocarcinoma   Staging form: Colon and Rectum, AJCC 7th Edition     Pathologic stage from 03/12/2015: Stage IIIB (T4a, N1c, cM0) - Signed by Truitt Merle, MD on 04/05/2015        Cancer of sigmoid colon ([name])   02/27/2015 Imaging CT abdomen and pelvis showed an annular constricting lesion within the sigmoid colon, no significant adenopathy or distant metastasis. CT abdomen and pelvis on 03/11/2015 showed similar findings with obstruction.   03/12/2015 Initial Diagnosis Sigmoid colon adenocarcinoma   03/12/2015 Tumor Marker CEA normal 2.5   03/12/2015 Surgery Sigmoid colon segmental resection by Dr. Excell Seltzer, surgical margins were negative.   03/12/2015 Pathology Results Well differentiated invasive adenocarcinoma with associated abundant mucin, 14 lymph nodes were negative, a tumor deposit was present, surgical margins were negative. pT4N1c   04/26/2015 -  Chemotherapy Capecitabine po 2026m q12h, 2 weeks on, one week off, as adjuvant chemo     HISTORY OF PRESENTING ILLNESS:  Greg [name] 75y.o. male is here because of stage III colon cancer. He was referred after his recent hospitalization.   He has been having abdominal discomfort and nausea for the past few months, along with some blood in his stool. No significant weight loss or change of his appetite. He presented to the ED 02/27/15 for abdominal pain with x-ray showing possible obstruction but CT scan negative for obstruction or ileus but it did indicate an annular constricting lesion in the sigmoid. He was seen by GI Dr OPaulita Fujitaper  colonoscopy failed due to the poor prep. There is warranted. He knows what He presented to ED again on 03/12/2015. CT abd/pelvis showed annular constricting lesion within the sigmoid colon with markedly dilated colon proximal to this lesion with air-fluid levels, compatible with distal colonic obstructing lesion concerning for malignancy as well as tiny hypodensities scattered throughout the liver, nonspecific but favor small cysts. General surgery was called and performed sigmoid colectomy with colostomy on 03/12/2015 by Dr. WHulen Skains The patient's post-operative course was complicated only by hypokalemia which was repeatedly replenished with IV and oral KCl. He received IV hydration and pain management and his diet was slowly advanced as tolerated. Physical and occupational therapy saw the patient and made recommendations. He was discharged to home with home health PT and ostomy care on 03/19/2015.   He has been recovering well from the surgery. He has no significant pain at the surgical site, no nausea, normal stool output in the colostomy bag. He has a mild to moderate fatigue, able to take care of himself and tolerating daily activities. He lives alone, uses walker as needed. He feels his energy level has been recovered well. He has a brother who lives for 5 blocks away, and helps him out when he needs.  CURRENT THERAPY: adjuvant Xeloda 2000 mg every 12 hours, 2 weeks on, one-week off, started on 04/26/2015  INTERIM HISTORY: TNeizanreturns for follow up. He finished the last cycle Xeloda around 10/20/2015. He tolerated well, no new complaints. His hand skin cracks has improved with the urea cream. He denies any significant neuropathy. He has good appetite and eating  well, tolerating routine activities without any difficulties. He was seen by Dr. Hulen Skains a few weeks ago, and colostomy reversion was discussed, likely of will be done in February.    MEDICAL HISTORY:  Past Medical History  Diagnosis Date    . Hyperlipidemia   . Hypertension   . Epididymitis     right  . Atony of bladder     followed at [facility]; CIC 3x/day, has been treated for culture proven serratia marcesens 05/2011, 06/2011, 07/2011, 09/2011  . [name] (benign prostatic hyperplasia)     hyposensitive bladder, nodular prostate with increased PSA (10.23 in 03/2006), s/p prostate biopsy 2006 that was normal (Dr Jeffie Pollock) he recommended TURP.  . Sinus bradycardia   . Chronic knee pain     left  . Gingivitis   . Right shoulder pain     sx in 06/2005  . Frozen shoulder     left frozen shoulder  . Shingles   . Cancer [facility])     SURGICAL HISTORY: Past Surgical History  Procedure Laterality Date  . Vasectomy    . Hydrocelectomy  11/14/2010    left hydrocelectomy  . Right shoulder arthroscopic surgery  06/2005  . Transurethral resection of prostate      status post TURP 2/2 [name] in 2008  . Knee surgery      L knee  . Laparotomy N/A 03/12/2015    Procedure: EXPLORATORY LAPAROTOMY;  Surgeon: Judeth Horn, MD;  Location: [place];  Service: General;  Laterality: N/A;  . Colostomy revision N/A 03/12/2015    Procedure:  RESECTION SIGMOID COLON;  Surgeon: Judeth Horn, MD;  Location: [place];  Service: General;  Laterality: N/A;  . Colostomy N/A 03/12/2015    Procedure: COLOSTOMY;  Surgeon: Judeth Horn, MD;  Location: [place];  Service: General;  Laterality: N/A;    SOCIAL HISTORY: Social History   Social History  . Marital Status: Single    Spouse Name: N/A  . Number of Children: N/A  . Years of Education: 12   Occupational History  .      retired   Social History Main Topics  . Smoking status: Former Smoker -- 0.50 packs/day for 25 years    Types: Cigarettes    Quit date: 02/07/1988  . Smokeless tobacco: Not on file  . Alcohol Use: 0.0 oz/week    0 Standard drinks or equivalent per week     Comment: occasional   . Drug Use: No  . Sexual Activity: Not on file   Other Topics Concern  . Not on file   Social  History Narrative   Single, lives alone in apartment   No children   Several local siblings in area-will take him to grocery store, etc   Rides bus (75cents/trip) and bus is right outside his house   Prior employment [employer]   Reports strong faith helps him cope with life   Has had to self-cath twice daily since 2008 -Dr. Jeffie Pollock   [facility] for ostomy care and supplies   Ambulates w/walker-independent in ADL's-cooks pot pies and frozen dinners    FAMILY HISTORY: Family History  Problem Relation Age of Onset  . Cancer Father     unknown type cancer     ALLERGIES:  is allergic to ciprofloxacin hcl and ibuprofen.  MEDICATIONS:  Current Outpatient Prescriptions  Medication Sig Dispense Refill  . capecitabine (XELODA) 500 MG tablet Take 4 tablets (2,000 mg total) by mouth 2 (two) times daily after a  meal. (Patient not taking: Reported on 09/30/2015) 112 tablet 4  . cetirizine (ZYRTEC) 10 MG tablet Take 1 tablet (10 mg total) by mouth daily as needed. 30 tablet 1  . fluticasone (FLONASE) 50 MCG/ACT nasal spray Place 1 spray into both nostrils daily. (Patient not taking: Reported on 09/30/2015) 16 g 2  . gabapentin (NEURONTIN) 100 MG capsule Take 1-2 capsules (100-200 mg total) by mouth at bedtime. 60 capsule 1  . guaiFENesin (MUCINEX) 600 MG 12 hr tablet Take 1 tablet (600 mg total) by mouth 2 (two) times daily. 30 tablet 0  . pravastatin (PRAVACHOL) 20 MG tablet Take 1 tablet (20 mg total) by mouth daily. 90 tablet 4  . sodium chloride (OCEAN) 0.65 % SOLN nasal spray Place 1 spray into both nostrils as needed for congestion. 1 Bottle 0  . sulfamethoxazole-trimethoprim (BACTRIM DS,SEPTRA DS) 800-160 MG tablet     . traMADol (ULTRAM) 50 MG tablet     . urea (CARMOL) 10 % cream Apply topically 3 (three) times daily. 71 g 0   No current facility-administered medications for this visit.    REVIEW OF SYSTEMS:   Constitutional: Denies fevers, chills or abnormal night  sweats Eyes: Denies blurriness of vision, double vision or watery eyes Ears, nose, mouth, throat, and face: Denies mucositis or sore throat Respiratory: Denies cough, dyspnea or wheezes Cardiovascular: Denies palpitation, chest discomfort or lower extremity swelling Gastrointestinal:  Denies nausea, heartburn or change in bowel habits Skin: see HPI  Lymphatics: Denies new lymphadenopathy or easy bruising Neurological:Denies numbness, tingling or new weaknesses Behavioral/Psych: Mood is stable, no new changes  All other systems were reviewed with the patient and are negative.  PHYSICAL EXAMINATION: ECOG PERFORMANCE STATUS: 1 - Symptomatic but completely ambulatory  Filed Vitals:   11/18/15 1010  BP: 130/76  Pulse: 48  Temp: 98 F (36.7 C)  Resp: 18   Filed Weights   11/18/15 1010  Weight: 186 lb 9.6 oz (84.641 kg)    GENERAL:alert, no distress and comfortable SKIN: skin color, texture, turgor are normal, significant skin pigmentation, thickening, and mild skin peeling on the palms and feet EYES: normal, conjunctiva are pink and non-injected, sclera clear OROPHARYNX:no exudate, no erythema and lips, buccal mucosa, and tongue normal  NECK: supple, thyroid normal size, non-tender, without nodularity LYMPH:  no palpable lymphadenopathy in the cervical, axillary or inguinal LUNGS: clear to auscultation and percussion with normal breathing effort HEART: regular rate & rhythm and no murmurs and no lower extremity edema ABDOMEN:abdomen soft, non-tender and normal bowel sounds, surgical incision is well healed, (+) colostomy bag in the left abdomen. Musculoskeletal:no cyanosis of digits and no clubbing  PSYCH: alert & oriented x 3 with fluent speech NEURO: no focal motor/sensory deficits  LABORATORY DATA:  I have reviewed the data as listed  CBC Latest Ref Rng 11/18/2015 09/30/2015 08/31/2015  WBC 4.0 - 10.3 10e3/uL 5.3 5.6 5.1  Hemoglobin 13.0 - 17.1 g/dL 14.3 13.3 13.5  Hematocrit  38.4 - 49.9 % 43.9 40.2 40.4  Platelets 140 - 400 10e3/uL 185 223 209    CMP Latest Ref Rng 11/18/2015 09/30/2015 08/31/2015  Glucose 70 - 140 mg/dl 83 90 84  BUN 7.0 - 26.0 mg/dL 17.8 18.1 16.1  Creatinine 0.7 - 1.3 mg/dL 1.2 1.1 1.2  Sodium 136 - 145 mEq/L 139 139 140  Potassium 3.5 - 5.1 mEq/L 4.3 3.8 4.8  Chloride 101 - 111 mmol/L - - -  CO2 22 - 29 mEq/L 27 21(L) 27  Calcium  8.4 - 10.4 mg/dL 9.0 8.9 9.2  Total Protein 6.4 - 8.3 g/dL 7.5 7.2 7.0  Total Bilirubin 0.20 - 1.20 mg/dL 0.73 0.82 0.74  Alkaline Phos 40 - 150 U/L 79 71 70  AST 5 - 34 U/L 14 16 15   ALT 0 - 55 U/L 9 <9 <9   PATHOLOGY REPORT  Diagnosis 03/12/2015 Colon, segmental resection for tumor, sigmoid colon - INVASIVE WELL-DIFFERENTIATED ADENOCARCINOMA WITH ASSOCIATED ABUNDANT MUCIN, SPANNING 7 CM IN GREATEST DIMENSION. 1 of 4 - TUMOR INVADES THROUGH MUSCULARIS PROPRIA THROUGH PERICOLORECTAL SOFT TISSUES TO INVOLVE SEROSAL SURFACE. - MARGINS ARE NEGATIVE. - FOURTEEN BENIGN LYMPH NODES WITH NO TUMOR SEEN (0/14). - SINGLE SOFT TISSUE SATELLITE  Microscopic Comment COLON AND RECTUM: Specimen: Sigmoid colon. Procedure: Resection of sigmoid colon. Tumor site: Sigmoid colon, distal aspect of specimen. Specimen integrity: Intact.  Macroscopic tumor perforation: Not identified. Invasive tumor: Maximum size: 7 cm. Histologic type(s): Invasive adenocarcinoma with abundant extracellular mucin. Histologic grade and differentiation: G1: well differentiated/low grade Type of polyp in which invasive carcinoma arose: Precursor polyp is not identified. Microscopic extension of invasive tumor: Tumor invades through muscularis propria through pericolorectal soft tissues to involve serosal surface. Lymph-Vascular invasion: Definitive lymph/vascular invasion is not identified; however, a tumor deposit is present, see below. Peri-neural invasion: Not identified. Tumor deposit(s) (discontinuous extramural extension): Yes, tumor  deposit present. Resection margins: Proximal margin: Negative. Distal margin: Negative. Mesenteric margin (sigmoid and transverse): Negative. Distance closest margin (if all above margins negative): 3.5 cm (mesenteric margin). Treatment effect (neo-adjuvant therapy): Not applicable. Additional polyp(s): No additional polyps identified. Non-neoplastic findings: No significant non-neoplastic findings. Lymph nodes: number examined 14; number positive: 0. Pathologic Staging: pT4a, pN1c. Ancillary studies: Per colorectal cancer protocol, MMR by IHC and MSI by PCR will be performed on the current tumor. (RH:ecj 03/16/2015)  ADDITIONAL INFORMATION: Mismatch Repair (MMR) Protein Immunohistochemistry (IHC) IHC Expression Result: MLH1: Preserved nuclear expression (greater 50% tumor expression) MSH2: Preserved nuclear expression (greater 50% tumor expression) MSH6: Preserved nuclear expression (greater 50% tumor expression) PMS2: Preserved nuclear expression (greater 50% tumor expression) * Internal control demonstrates intact nuclear expression Interpretation: NORMAL    RADIOGRAPHIC STUDIES: I have personally reviewed the radiological images as listed and agreed with the findings in the report.  Ct Abdomen Pelvis W Contrast 03/11/2015    FINDINGS: Lower chest: Linear subsegmental atelectasis in the lung bases. No effusions. Heart is upper limits normal in size.  Hepatobiliary: Scattered small nonspecific hypodensities throughout the liver, stable. Gallbladder unremarkable. No biliary ductal dilatation.  Pancreas: No focal abnormality or ductal dilatation.  Spleen: No focal abnormality.  Normal size.  Adrenals/Urinary Tract: No focal renal or adrenal abnormality. No hydronephrosis. Urinary bladder is unremarkable.  Stomach/Bowel: The colon is dilated and fluid-filled with air-fluid levels. Stomach and small bowel are decompressed. The colon is dilated to an annular constricting lesion within the  sigmoid colon.  Vascular/Lymphatic: No retroperitoneal or mesenteric adenopathy. Aorta normal caliber.  Reproductive: No mass or other significant abnormality.  Other: No free fluid or free air.  Musculoskeletal: Degenerative changes in the lumbar spine. No acute findings.   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 Findings compatible with distal colonic obstructing lesion concerning for malignancy.  Tiny hypodensities scattered throughout the liver, nonspecific, favor small cysts.   Electronically Signed   By: Rolm Baptise M.D.   On: 03/11/2015 21:17    ASSESSMENT & PLAN: 75 year old African-American male  1. Sigmoid  colon cancer, pT4N1cM0, stage IIIB, grade 1, MMR normal -I reviewed his CT abdomen and pelvis findings, and his surgical pathology results in great details with the patient. -Giving the advanced stage IIIB disease, he does have moderate to high risk of cancer recurrence after surgery. -He has completed adjuvant Xeloda, tolerating very well.  -I discussed the risk of cancer recurrence in the future. I discussed the surveillance plan, which is a physical exam and lab test (including CBC, CMP and CEA) every 3 months for the first 2 years, then every 6-12 months, colonoscopy in one year, and surveilliance CT scan every 6-12 month for up to 5 year.  -I'll obtain the first surveillance CT scans in the next month.    2. 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 -continue Neurontin 100 mg in the morning, 200 mg at night. His neuropathy has significantly improved.  4. Atony of bladder -He does straight cath twice a day, and on chronic Bactrim to prevent UTI. -He'll continue follow-up with his urologist.  5. Hypertension, arthritis -He will continue follow-up with his  primary care physician    Plan -CT chest, abdomen and pelvis with contrast in the next 2 weeks, I'll call him with the scan results -Return to clinic in 3 months with lab   All questions were answered. The patient knows to call the clinic with any problems, questions or concerns. I spent 20 minutes counseling the patient face to face. The total time spent in the appointment was 25 minutes and more than 50% was on counseling.     Truitt Merle, MD 11/18/2015

## 2015-11-18 NOTE — Progress Notes (Signed)
Oncology Nurse Navigator Documentation  Oncology Nurse Navigator Flowsheets 04/26/2015 05/03/2015 11/18/2015  Navigator Location - - [facility]Med Onc  Navigator Encounter Type Telephone Telephone Follow-up Appt  Patient Visit Type - - MedOnc  Treatment Phase - Treatment Treatment  Barriers/Navigation Needs No barriers at this time No barriers at this time Financial  Interventions - None required Medication assitance  Support Groups/Services - - -  Acuity - - Level 1  Time Spent with Patient - 15 15  Time Spent with Patient (Retired) 15 - -

## 2015-11-18 NOTE — Telephone Encounter (Signed)
Pt confirmed labs/ov per 01/27 POF, gave pt AVS and Calendar... KJ, gave pt barium for CT scan

## 2015-12-02 ENCOUNTER — Ambulatory Visit (HOSPITAL_COMMUNITY)
Admission: RE | Admit: 2015-12-02 | Discharge: 2015-12-02 | Disposition: A | Discharge status: Home / Self Care | Payer: Medicare Other | Source: Ambulatory Visit | Attending: Hematology | Admitting: Hematology

## 2015-12-02 ENCOUNTER — Encounter (HOSPITAL_COMMUNITY): Payer: Self-pay

## 2015-12-02 DIAGNOSIS — C187 Malignant neoplasm of sigmoid colon: Secondary | ICD-10-CM | POA: Diagnosis not present

## 2015-12-02 DIAGNOSIS — R590 Localized enlarged lymph nodes: Secondary | ICD-10-CM | POA: Insufficient documentation

## 2015-12-02 DIAGNOSIS — Z923 Personal history of irradiation: Secondary | ICD-10-CM | POA: Insufficient documentation

## 2015-12-02 DIAGNOSIS — K4090 Unilateral inguinal hernia, without obstruction or gangrene, not specified as recurrent: Secondary | ICD-10-CM | POA: Insufficient documentation

## 2015-12-02 DIAGNOSIS — Z9221 Personal history of antineoplastic chemotherapy: Secondary | ICD-10-CM | POA: Insufficient documentation

## 2015-12-02 DIAGNOSIS — N400 Enlarged prostate without lower urinary tract symptoms: Secondary | ICD-10-CM | POA: Insufficient documentation

## 2015-12-02 DIAGNOSIS — J439 Emphysema, unspecified: Secondary | ICD-10-CM | POA: Diagnosis not present

## 2015-12-02 MED ORDER — IOHEXOL 300 MG/ML  SOLN
100.0000 mL | Freq: Once | INTRAMUSCULAR | Status: AC | PRN
  Administered 2015-12-02: 100 mL via INTRAVENOUS

## 2015-12-07 ENCOUNTER — Other Ambulatory Visit: Payer: Self-pay | Admitting: General Surgery

## 2015-12-07 DIAGNOSIS — Z933 Colostomy status: Secondary | ICD-10-CM

## 2015-12-07 DIAGNOSIS — C189 Malignant neoplasm of colon, unspecified: Secondary | ICD-10-CM

## 2015-12-13 DIAGNOSIS — B964 Proteus (mirabilis) (morganii) as the cause of diseases classified elsewhere: Secondary | ICD-10-CM | POA: Diagnosis not present

## 2015-12-13 DIAGNOSIS — N312 Flaccid neuropathic bladder, not elsewhere classified: Secondary | ICD-10-CM | POA: Diagnosis not present

## 2015-12-13 DIAGNOSIS — N3020 Other chronic cystitis without hematuria: Secondary | ICD-10-CM | POA: Diagnosis not present

## 2015-12-13 DIAGNOSIS — N390 Urinary tract infection, site not specified: Secondary | ICD-10-CM | POA: Diagnosis not present

## 2016-01-27 ENCOUNTER — Inpatient Hospital Stay: Admission: RE | Admit: 2016-01-27 | Payer: Medicare Other | Source: Ambulatory Visit

## 2016-02-16 ENCOUNTER — Encounter: Payer: Self-pay | Admitting: Hematology

## 2016-02-16 ENCOUNTER — Other Ambulatory Visit (HOSPITAL_BASED_OUTPATIENT_CLINIC_OR_DEPARTMENT_OTHER): Payer: Medicare Other

## 2016-02-16 ENCOUNTER — Ambulatory Visit (HOSPITAL_BASED_OUTPATIENT_CLINIC_OR_DEPARTMENT_OTHER): Payer: Medicare Other | Admitting: Hematology

## 2016-02-16 VITALS — BP 138/90 | HR 48 | Temp 97.6°F | Resp 18 | Ht 71.0 in | Wt 189.3 lb

## 2016-02-16 DIAGNOSIS — N3289 Other specified disorders of bladder: Secondary | ICD-10-CM | POA: Diagnosis not present

## 2016-02-16 DIAGNOSIS — I10 Essential (primary) hypertension: Secondary | ICD-10-CM | POA: Diagnosis not present

## 2016-02-16 DIAGNOSIS — C187 Malignant neoplasm of sigmoid colon: Secondary | ICD-10-CM

## 2016-02-16 DIAGNOSIS — C189 Malignant neoplasm of colon, unspecified: Secondary | ICD-10-CM

## 2016-02-16 DIAGNOSIS — M1990 Unspecified osteoarthritis, unspecified site: Secondary | ICD-10-CM

## 2016-02-16 LAB — CBC WITH DIFFERENTIAL/PLATELET
BASO%: 1.1 % (ref 0.0–2.0)
BASOS ABS: 0.1 10*3/uL (ref 0.0–0.1)
EOS ABS: 0.1 10*3/uL (ref 0.0–0.5)
EOS%: 1.6 % (ref 0.0–7.0)
HCT: 44.5 % (ref 38.4–49.9)
HEMOGLOBIN: 14.5 g/dL (ref 13.0–17.1)
LYMPH%: 27.9 % (ref 14.0–49.0)
MCH: 31.3 pg (ref 27.2–33.4)
MCHC: 32.5 g/dL (ref 32.0–36.0)
MCV: 96.3 fL (ref 79.3–98.0)
MONO#: 0.6 10*3/uL (ref 0.1–0.9)
MONO%: 11.2 % (ref 0.0–14.0)
NEUT#: 3.3 10*3/uL (ref 1.5–6.5)
NEUT%: 58.2 % (ref 39.0–75.0)
Platelets: 220 10*3/uL (ref 140–400)
RBC: 4.62 10*6/uL (ref 4.20–5.82)
RDW: 13.4 % (ref 11.0–14.6)
WBC: 5.6 10*3/uL (ref 4.0–10.3)
lymph#: 1.6 10*3/uL (ref 0.9–3.3)

## 2016-02-16 LAB — COMPREHENSIVE METABOLIC PANEL
ALK PHOS: 77 U/L (ref 40–150)
ALT: 12 U/L (ref 0–55)
ANION GAP: 7 meq/L (ref 3–11)
AST: 17 U/L (ref 5–34)
Albumin: 3.7 g/dL (ref 3.5–5.0)
BILIRUBIN TOTAL: 0.34 mg/dL (ref 0.20–1.20)
BUN: 23.8 mg/dL (ref 7.0–26.0)
CO2: 26 meq/L (ref 22–29)
Calcium: 9 mg/dL (ref 8.4–10.4)
Chloride: 106 mEq/L (ref 98–109)
Creatinine: 1.2 mg/dL (ref 0.7–1.3)
EGFR: 67 mL/min/{1.73_m2} — AB (ref >90)
Glucose: 93 mg/dl (ref 70–140)
POTASSIUM: 4.1 meq/L (ref 3.5–5.1)
Sodium: 139 mEq/L (ref 136–145)
TOTAL PROTEIN: 7.4 g/dL (ref 6.4–8.3)

## 2016-02-16 NOTE — Progress Notes (Signed)
[place]  [phone] [phone]  Clinic Follow up Note   Patient Care Team: Norman Herrlich, MD as PCP - General Judeth Horn, MD as Consulting Physician (General Surgery) Truitt Merle, MD as Consulting Physician (Hematology) Irine Seal, MD as Attending Physician (Urology) Tania Ade, RN as Registered Nurse (Medical Oncology) 02/16/2016  CHIEF COMPLAINTS:  Follow up stage III colon cancer   Oncology History   Colon adenocarcinoma   Staging form: Colon and Rectum, AJCC 7th Edition     Pathologic stage from 03/12/2015: Stage IIIB (T4a, N1c, cM0) - Signed by Truitt Merle, MD on 04/05/2015        Cancer of sigmoid colon ([place])   02/27/2015 Imaging CT abdomen and pelvis showed an annular constricting lesion within the sigmoid colon, no significant adenopathy or distant metastasis. CT abdomen and pelvis on 03/11/2015 showed similar findings with obstruction.   03/12/2015 Initial Diagnosis Sigmoid colon adenocarcinoma   03/12/2015 Tumor Marker CEA normal 2.5   03/12/2015 Surgery Sigmoid colon segmental resection by Dr. Excell Seltzer, surgical margins were negative.   03/12/2015 Pathology Results Well differentiated invasive adenocarcinoma with associated abundant mucin, 14 lymph nodes were negative, a tumor deposit was present, surgical margins were negative. pT4N1c   04/26/2015 - 10/20/2015 Chemotherapy Capecitabine po 2038m q12h, 2 weeks on, one week off, as adjuvant chemo    12/02/2015 Imaging CT chest, abdomen and pelvis with contrast showed a moderately enlarged left external iliac lymph node, 1 cm, indeterminate, interval decrease size of previously described subcentimeter perirectal nodule, no other evidence of metastatic disease.    HISTORY OF PRESENTING ILLNESS:  Greg [name] 75y.o. male is here because of stage III colon cancer. He was referred after his recent hospitalization.   He has been having abdominal discomfort and nausea for the past few  months, along with some blood in his stool. No significant weight loss or change of his appetite. He presented to the ED 02/27/15 for abdominal pain with x-ray showing possible obstruction but CT scan negative for obstruction or ileus but it did indicate an annular constricting lesion in the sigmoid. He was seen by GI Dr OPaulita Fujitaper colonoscopy failed due to the poor prep. There is warranted. He knows what He presented to ED again on 03/12/2015. CT abd/pelvis showed annular constricting lesion within the sigmoid colon with markedly dilated colon proximal to this lesion with air-fluid levels, compatible with distal colonic obstructing lesion concerning for malignancy as well as tiny hypodensities scattered throughout the liver, nonspecific but favor small cysts. General surgery was called and performed sigmoid colectomy with colostomy on 03/12/2015 by Dr. WHulen Skains The patient's post-operative course was complicated only by hypokalemia which was repeatedly replenished with IV and oral KCl. He received IV hydration and pain management and his diet was slowly advanced as tolerated. Physical and occupational therapy saw the patient and made recommendations. He was discharged to home with home health PT and ostomy care on 03/19/2015.   He has been recovering well from the surgery. He has no significant pain at the surgical site, no nausea, normal stool output in the colostomy bag. He has a mild to moderate fatigue, able to take care of himself and tolerating daily activities. He lives alone, uses walker as needed. He feels his energy level has been recovered well. He has a brother who lives for 5 blocks away, and helps him out when he needs.  CURRENT THERAPY: Surveillance  INTERIM HISTORY: Greg [name] for follow up. He has  been doing well since he completed chemotherapy 4 months ago, with more energy and appetite. He denies any significant pain, abdominal discomfort, or change of his bowel habits. He feels well  overall. No other new symptoms. He gained about 7 pounds in the past 4 months.   MEDICAL HISTORY:  Past Medical History  Diagnosis Date  . Hyperlipidemia   . Hypertension   . Epididymitis     right  . Atony of bladder     followed at [facility]; CIC 3x/day, has been treated for culture proven serratia marcesens 05/2011, 06/2011, 07/2011, 09/2011  . [name] (benign prostatic hyperplasia)     hyposensitive bladder, nodular prostate with increased PSA (10.23 in 03/2006), s/p prostate biopsy 2006 that was normal (Dr Jeffie Pollock) he recommended TURP.  . Sinus bradycardia   . Chronic knee pain     left  . Gingivitis   . Right shoulder pain     sx in 06/2005  . Frozen shoulder     left frozen shoulder  . Shingles   . Cancer [facility])     SURGICAL HISTORY: Past Surgical History  Procedure Laterality Date  . Vasectomy    . Hydrocelectomy  11/14/2010    left hydrocelectomy  . Right shoulder arthroscopic surgery  06/2005  . Transurethral resection of prostate      status post TURP 2/2 [name] in 2008  . Knee surgery      L knee  . Laparotomy N/A 03/12/2015    Procedure: EXPLORATORY LAPAROTOMY;  Surgeon: Judeth Horn, MD;  Location: [place];  Service: General;  Laterality: N/A;  . Colostomy revision N/A 03/12/2015    Procedure:  RESECTION SIGMOID COLON;  Surgeon: Judeth Horn, MD;  Location: [place];  Service: General;  Laterality: N/A;  . Colostomy N/A 03/12/2015    Procedure: COLOSTOMY;  Surgeon: Judeth Horn, MD;  Location: [place];  Service: General;  Laterality: N/A;    SOCIAL HISTORY: Social History   Social History  . Marital Status: Single    Spouse Name: N/A  . Number of Children: N/A  . Years of Education: 12   Occupational History  .      retired   Social History Main Topics  . Smoking status: Former Smoker -- 0.50 packs/day for 25 years    Types: Cigarettes    Quit date: 02/07/1988  . Smokeless tobacco: Not on file  . Alcohol Use: 0.0 oz/week    0 Standard drinks or equivalent  per week     Comment: occasional   . Drug Use: No  . Sexual Activity: Not on file   Other Topics Concern  . Not on file   Social History Narrative   Single, lives alone in apartment   No children   Several local siblings in area-will take him to grocery store, etc   Rides bus (75cents/trip) and bus is right outside his house   Prior employment [employer]   Reports strong faith helps him cope with life   Has had to self-cath twice daily since 2008 -Dr. Jeffie Pollock   [facility] for ostomy care and supplies   Ambulates w/walker-independent in ADL's-cooks pot pies and frozen dinners    FAMILY HISTORY: Family History  Problem Relation Age of Onset  . Cancer Father     unknown type cancer     ALLERGIES:  is allergic to ciprofloxacin hcl and ibuprofen.  MEDICATIONS:  Current Outpatient Prescriptions  Medication Sig Dispense Refill  . gabapentin (NEURONTIN) 100 MG  capsule Take 1-2 capsules (100-200 mg total) by mouth at bedtime. 60 capsule 1  . pravastatin (PRAVACHOL) 20 MG tablet Take 1 tablet (20 mg total) by mouth daily. 90 tablet 4  . sodium chloride (OCEAN) 0.65 % SOLN nasal spray Place 1 spray into both nostrils as needed for congestion. 1 Bottle 0  . sulfamethoxazole-trimethoprim (BACTRIM DS,SEPTRA DS) 800-160 MG tablet     . urea (CARMOL) 10 % cream Apply topically 3 (three) times daily. 71 g 0   No current facility-administered medications for this visit.    REVIEW OF SYSTEMS:   Constitutional: Denies fevers, chills or abnormal night sweats Eyes: Denies blurriness of vision, double vision or watery eyes Ears, nose, mouth, throat, and face: Denies mucositis or sore throat Respiratory: Denies cough, dyspnea or wheezes Cardiovascular: Denies palpitation, chest discomfort or lower extremity swelling Gastrointestinal:  Denies nausea, heartburn or change in bowel habits Skin: see HPI  Lymphatics: Denies new lymphadenopathy or easy bruising Neurological:Denies  numbness, tingling or new weaknesses Behavioral/Psych: Mood is stable, no new changes  All other systems were reviewed with the patient and are negative.  PHYSICAL EXAMINATION: ECOG PERFORMANCE STATUS: 1 - Symptomatic but completely ambulatory  Filed Vitals:   02/16/16 0940  BP: 138/90  Pulse: 48  Temp: 97.6 F (36.4 C)  Resp: 18   Filed Weights   02/16/16 0940  Weight: 189 lb 4.8 oz (85.866 kg)    GENERAL:alert, no distress and comfortable SKIN: skin color, texture, turgor are normal, significant skin pigmentation, thickening, and mild skin peeling on the palms and feet EYES: normal, conjunctiva are pink and non-injected, sclera clear OROPHARYNX:no exudate, no erythema and lips, buccal mucosa, and tongue normal  NECK: supple, thyroid normal size, non-tender, without nodularity LYMPH:  no palpable lymphadenopathy in the cervical, axillary or inguinal LUNGS: clear to auscultation and percussion with normal breathing effort HEART: regular rate & rhythm and no murmurs and no lower extremity edema ABDOMEN:abdomen soft, non-tender and normal bowel sounds, surgical incision is well healed, (+) colostomy bag in the left abdomen. Musculoskeletal:no cyanosis of digits and no clubbing  PSYCH: alert & oriented x 3 with fluent speech NEURO: no focal motor/sensory deficits  LABORATORY DATA:  I have reviewed the data as listed  CBC Latest Ref Rng 02/16/2016 11/18/2015 09/30/2015  WBC 4.0 - 10.3 10e3/uL 5.6 5.3 5.6  Hemoglobin 13.0 - 17.1 g/dL 14.5 14.3 13.3  Hematocrit 38.4 - 49.9 % 44.5 43.9 40.2  Platelets 140 - 400 10e3/uL 220 185 223    CMP Latest Ref Rng 02/16/2016 11/18/2015 09/30/2015  Glucose 70 - 140 mg/dl 93 83 90  BUN 7.0 - 26.0 mg/dL 23.8 17.8 18.1  Creatinine 0.7 - 1.3 mg/dL 1.2 1.2 1.1  Sodium 136 - 145 mEq/L 139 139 139  Potassium 3.5 - 5.1 mEq/L 4.1 4.3 3.8  CO2 22 - 29 mEq/L 26 27 21(L)  Calcium 8.4 - 10.4 mg/dL 9.0 9.0 8.9  Total Protein 6.4 - 8.3 g/dL 7.4 7.5 7.2    Total Bilirubin 0.20 - 1.20 mg/dL 0.34 0.73 0.82  Alkaline Phos 40 - 150 U/L 77 79 71  AST 5 - 34 U/L _0 ALT 0 - 55 U/L 12 9 <9   PATHOLOGY REPORT  Diagnosis 03/12/2015 Colon, segmental resection for tumor, sigmoid colon - INVASIVE WELL-DIFFERENTIATED ADENOCARCINOMA WITH ASSOCIATED ABUNDANT MUCIN, SPANNING 7 CM IN GREATEST DIMENSION. 1 of 4 - TUMOR INVADES THROUGH MUSCULARIS PROPRIA THROUGH PERICOLORECTAL SOFT TISSUES TO INVOLVE SEROSAL SURFACE. - MARGINS ARE  NEGATIVE. - FOURTEEN BENIGN LYMPH NODES WITH NO TUMOR SEEN (0/14). - SINGLE SOFT TISSUE SATELLITE  Microscopic Comment COLON AND RECTUM: Specimen: Sigmoid colon. Procedure: Resection of sigmoid colon. Tumor site: Sigmoid colon, distal aspect of specimen. Specimen integrity: Intact.  Macroscopic tumor perforation: Not identified. Invasive tumor: Maximum size: 7 cm. Histologic type(s): Invasive adenocarcinoma with abundant extracellular mucin. Histologic grade and differentiation: G1: well differentiated/low grade Type of polyp in which invasive carcinoma arose: Precursor polyp is not identified. Microscopic extension of invasive tumor: Tumor invades through muscularis propria through pericolorectal soft tissues to involve serosal surface. Lymph-Vascular invasion: Definitive lymph/vascular invasion is not identified; however, a tumor deposit is present, see below. Peri-neural invasion: Not identified. Tumor deposit(s) (discontinuous extramural extension): Yes, tumor deposit present. Resection margins: Proximal margin: Negative. Distal margin: Negative. Mesenteric margin (sigmoid and transverse): Negative. Distance closest margin (if all above margins negative): 3.5 cm (mesenteric margin). Treatment effect (neo-adjuvant therapy): Not applicable. Additional polyp(s): No additional polyps identified. Non-neoplastic findings: No significant non-neoplastic findings. Lymph nodes: number examined 14; number positive:  0. Pathologic Staging: pT4a, pN1c. Ancillary studies: Per colorectal cancer protocol, MMR by IHC and MSI by PCR will be performed on the current tumor. (RH:ecj 03/16/2015)  ADDITIONAL INFORMATION: Mismatch Repair (MMR) Protein Immunohistochemistry (IHC) IHC Expression Result: MLH1: Preserved nuclear expression (greater 50% tumor expression) MSH2: Preserved nuclear expression (greater 50% tumor expression) MSH6: Preserved nuclear expression (greater 50% tumor expression) PMS2: Preserved nuclear expression (greater 50% tumor expression) * Internal control demonstrates intact nuclear expression Interpretation: NORMAL   RADIOGRAPHIC STUDIES: I have personally reviewed the radiological images as listed and agreed with the findings in the report.  Ct chest Abdomen Pelvis W Contrast 12/02/2015   IMPRESSION: 1. Newly mildly enlarged left external iliac lymph node, indeterminate, nodal metastasis not excluded. PET-CT could be considered for further characterization versus short-term follow-up CT abdomen/pelvis in 3 months. 2. Interval decreased size of previously described subcentimeter perirectal nodule. 3. Otherwise no evidence of metastatic disease in the chest, abdomen or pelvis. 4. Minimal paraseptal emphysema. 5. Chronic mild diffuse bladder wall thickening, probably due to chronic bladder outlet obstruction by the enlarged prostate. 6. Stable small fat containing left inguinal hernia.   ASSESSMENT & PLAN: 74 year old African-American male  1. Sigmoid colon cancer, pT4N1cM0, stage IIIB, grade 1, MMR normal -I reviewed his CT abdomen and pelvis findings, and his surgical pathology results in great details with the patient. -Giving the advanced stage IIIB disease, he does have moderate to high risk of cancer recurrence after surgery. -He has completed adjuvant Xeloda, tolerating very well.  -I discussed the risk of cancer recurrence in the future. I discussed the surveillance plan,  which is a physical exam and lab test (including CBC, CMP and CEA) every 3 months for the first 2 years, then every 6-12 months, colonoscopy in one year, and surveilliance CT scan every 6-12 month for up to 5 year.  -His surveillance scan in February 2017 showed a indeterminate left external iliac lymph node, no other evidence of recurrence. I reviewed with him today. He is clinically doing well, I'll obtain a close follow-up CT of abdomen and pelvis next month. He is likely going to have his colostomy revision surgery, I'll correlated with his surgeon Dr. Hulen Skains about his scan. -I'll continue surveillance, I'll see him back in 3 months with lab   2.  Atony of bladder -He does straight cath twice a day, and on chronic Bactrim to prevent UTI. -He'll continue follow-up with his urologist.  3. Hypertension, arthritis -He will continue follow-up with his primary care physician    Plan -CT abdomen and pelvis with contrast in one month, will coordinate his scan with his surgeon Dr. Hulen Skains  -Return to clinic in 3 months with lab   All questions were answered. The patient knows to call the clinic with any problems, questions or concerns. I spent 20 minutes counseling the patient face to face. The total time spent in the appointment was 25 minutes and more than 50% was on counseling.     Truitt Merle, MD 02/16/2016

## 2016-02-17 ENCOUNTER — Telehealth: Payer: Self-pay | Admitting: Hematology

## 2016-02-17 LAB — CEA: CEA1: 2.3 ng/mL (ref 0.0–4.7)

## 2016-02-17 NOTE — Addendum Note (Signed)
Addended by: Truitt Merle on: 02/17/2016 04:40 PM   Modules accepted: Orders

## 2016-02-17 NOTE — Telephone Encounter (Signed)
sch pt appt-need to call pt and adv of appts and to pick up contrast fot CT

## 2016-02-20 ENCOUNTER — Telehealth: Payer: Self-pay | Admitting: Hematology

## 2016-02-20 NOTE — Telephone Encounter (Signed)
cld & spoke to pt and gave pt time & date of appt for 7/21-adv [facility] all to sch scan-adv need to get contrast

## 2016-03-23 ENCOUNTER — Telehealth: Payer: Self-pay | Admitting: *Deleted

## 2016-03-23 ENCOUNTER — Encounter: Payer: Self-pay | Admitting: *Deleted

## 2016-03-23 NOTE — Telephone Encounter (Signed)
Called pt and left message on voice mail requesting a call back to nurse about his upcoming surgery.

## 2016-03-23 NOTE — Telephone Encounter (Signed)
Spoke with pt and encouraged pt to call his surgeon Dr. Richarda Blade office for an appt and to keep appt for further workup to reverse colostomy.   Pt stated he has been trying to find ride to the surgeon's office. Pt rides the bus to oncology appts.   Stated has no transportation but is trying to find ride.  Instructed pt to call Dr. Richarda Blade office to let office know that he is still trying to make appt.  Pt voiced understanding. Spoke with Abby Potash, SW and provided above info.   Abby Potash stated she would look into other assistance available to help pt.

## 2016-03-23 NOTE — Progress Notes (Signed)
[place] Work  Clinical Social Work was referred by Medical sales representative for assessment of psychosocial needs due to possible transportation concerns as pt has not been able to get to appointments to meet with surgeon.  Clinical Social Worker attempted to contact patient at home to offer support and assess for needs. CSW left pt detailed message as to how CSW could assist and encouraged pt to please return CSW call. CSW hopeful pt will return call. Pt could use [employer] transportation and [employer] as possible options. [employer] is another possible option.   Loren Racer, [place] Worker [place]  [place] Phone: [phone] Fax: [phone]

## 2016-03-26 ENCOUNTER — Telehealth: Payer: Self-pay | Admitting: *Deleted

## 2016-03-26 NOTE — Telephone Encounter (Signed)
Nurse noted that Abby Potash, SW had attempted to contact pt on 6/2 for possible assistance with transportation to surgeon's office.   Left message on voice mail today encouraging pt to call SW in the am to address transportation issue.

## 2016-03-27 ENCOUNTER — Encounter: Payer: Self-pay | Admitting: *Deleted

## 2016-03-27 NOTE — Progress Notes (Signed)
[place] Work  Holiday representative attempted to contact patient by phone to follow up regarding transportation issues.  CSW left patient a detailed voicemail regarding transportation and encouraged patient to call back.  Johnnye Lana, MSW, LCSW, OSW-C Clinical Social Worker [facility] [phone]

## 2016-05-11 ENCOUNTER — Encounter (HOSPITAL_COMMUNITY): Payer: Self-pay

## 2016-05-11 ENCOUNTER — Ambulatory Visit (HOSPITAL_COMMUNITY)
Admission: RE | Admit: 2016-05-11 | Discharge: 2016-05-11 | Disposition: A | Discharge status: Home / Self Care | Payer: Medicare Other | Source: Ambulatory Visit | Attending: Hematology | Admitting: Hematology

## 2016-05-11 ENCOUNTER — Other Ambulatory Visit (HOSPITAL_BASED_OUTPATIENT_CLINIC_OR_DEPARTMENT_OTHER): Payer: Medicare Other

## 2016-05-11 DIAGNOSIS — C187 Malignant neoplasm of sigmoid colon: Secondary | ICD-10-CM | POA: Insufficient documentation

## 2016-05-11 DIAGNOSIS — N3289 Other specified disorders of bladder: Secondary | ICD-10-CM | POA: Insufficient documentation

## 2016-05-11 DIAGNOSIS — K769 Liver disease, unspecified: Secondary | ICD-10-CM | POA: Insufficient documentation

## 2016-05-11 DIAGNOSIS — C189 Malignant neoplasm of colon, unspecified: Secondary | ICD-10-CM

## 2016-05-11 LAB — CBC WITH DIFFERENTIAL/PLATELET
BASO%: 1.2 % (ref 0.0–2.0)
Basophils Absolute: 0.1 10*3/uL (ref 0.0–0.1)
EOS ABS: 0.1 10*3/uL (ref 0.0–0.5)
EOS%: 1.1 % (ref 0.0–7.0)
HEMATOCRIT: 44.7 % (ref 38.4–49.9)
HEMOGLOBIN: 14.6 g/dL (ref 13.0–17.1)
LYMPH%: 26.8 % (ref 14.0–49.0)
MCH: 31 pg (ref 27.2–33.4)
MCHC: 32.6 g/dL (ref 32.0–36.0)
MCV: 95.1 fL (ref 79.3–98.0)
MONO#: 0.5 10*3/uL (ref 0.1–0.9)
MONO%: 9.8 % (ref 0.0–14.0)
NEUT%: 61.1 % (ref 39.0–75.0)
NEUTROS ABS: 3.3 10*3/uL (ref 1.5–6.5)
PLATELETS: 248 10*3/uL (ref 140–400)
RBC: 4.7 10*6/uL (ref 4.20–5.82)
RDW: 14.2 % (ref 11.0–14.6)
WBC: 5.4 10*3/uL (ref 4.0–10.3)
lymph#: 1.5 10*3/uL (ref 0.9–3.3)

## 2016-05-11 LAB — COMPREHENSIVE METABOLIC PANEL
ALK PHOS: 82 U/L (ref 40–150)
ALT: 12 U/L (ref 0–55)
AST: 19 U/L (ref 5–34)
Albumin: 3.8 g/dL (ref 3.5–5.0)
Anion Gap: 8 mEq/L (ref 3–11)
BUN: 17 mg/dL (ref 7.0–26.0)
CHLORIDE: 104 meq/L (ref 98–109)
CO2: 27 meq/L (ref 22–29)
Calcium: 9 mg/dL (ref 8.4–10.4)
Creatinine: 1 mg/dL (ref 0.7–1.3)
EGFR: 80 mL/min/{1.73_m2} — ABNORMAL LOW (ref >90)
GLUCOSE: 87 mg/dL (ref 70–140)
POTASSIUM: 4.5 meq/L (ref 3.5–5.1)
SODIUM: 139 meq/L (ref 136–145)
Total Bilirubin: 0.51 mg/dL (ref 0.20–1.20)
Total Protein: 7.6 g/dL (ref 6.4–8.3)

## 2016-05-11 MED ORDER — IOPAMIDOL (ISOVUE-300) INJECTION 61%
100.0000 mL | Freq: Once | INTRAVENOUS | Status: AC | PRN
Start: 2016-05-11 — End: 2016-05-11
  Administered 2016-05-11: 100 mL via INTRAVENOUS

## 2016-05-12 LAB — CEA: CEA: 2.6 ng/mL (ref 0.0–4.7)

## 2016-05-13 ENCOUNTER — Other Ambulatory Visit: Payer: Self-pay | Admitting: Hematology

## 2016-05-18 ENCOUNTER — Telehealth: Payer: Self-pay | Admitting: Hematology

## 2016-05-18 ENCOUNTER — Ambulatory Visit (HOSPITAL_BASED_OUTPATIENT_CLINIC_OR_DEPARTMENT_OTHER): Payer: Medicare Other | Admitting: Hematology

## 2016-05-18 VITALS — BP 150/84 | HR 51 | Temp 97.9°F | Resp 18 | Ht 71.0 in | Wt 188.5 lb

## 2016-05-18 DIAGNOSIS — C187 Malignant neoplasm of sigmoid colon: Secondary | ICD-10-CM | POA: Diagnosis not present

## 2016-05-18 DIAGNOSIS — N312 Flaccid neuropathic bladder, not elsewhere classified: Secondary | ICD-10-CM

## 2016-05-18 DIAGNOSIS — I10 Essential (primary) hypertension: Secondary | ICD-10-CM | POA: Diagnosis not present

## 2016-05-18 NOTE — Telephone Encounter (Signed)
Gave pt cal & avs °

## 2016-05-18 NOTE — Progress Notes (Signed)
[facility]  [phone]) [phone] Fax:([phone]) [phone]  Clinic Follow up Note   Patient Care Team: Norman Herrlich, MD as PCP - General Judeth Horn, MD as Consulting Physician (General Surgery) Truitt Merle, MD as Consulting Physician (Hematology) Irine Seal, MD as Attending Physician (Urology) Tania Ade, RN as Registered Nurse (Medical Oncology) 05/18/2016  CHIEF COMPLAINTS:  Follow up stage III colon cancer   Oncology History   Colon adenocarcinoma   Staging form: Colon and Rectum, AJCC 7th Edition     Pathologic stage from 03/12/2015: Stage IIIB (T4a, N1c, cM0) - Signed by Truitt Merle, MD on 04/05/2015        Cancer of sigmoid colon ([place])   02/27/2015 Imaging    CT abdomen and pelvis showed an annular constricting lesion within the sigmoid colon, no significant adenopathy or distant metastasis. CT abdomen and pelvis on 03/11/2015 showed similar findings with obstruction.     03/12/2015 Initial Diagnosis    Sigmoid colon adenocarcinoma     03/12/2015 Tumor Marker    CEA normal 2.5     03/12/2015 Surgery    Sigmoid colon segmental resection by Dr. Excell Seltzer, surgical margins were negative.     03/12/2015 Pathology Results    Well differentiated invasive adenocarcinoma with associated abundant mucin, 14 lymph nodes were negative, a tumor deposit was present, surgical margins were negative. pT4N1c     04/26/2015 - 10/20/2015 Chemotherapy    Capecitabine po 2010m q12h, 2 weeks on, one week off, as adjuvant chemo      12/02/2015 Imaging    CT chest, abdomen and pelvis with contrast showed a moderately enlarged left external iliac lymph node, 1 cm, indeterminate, interval decrease size of previously described subcentimeter perirectal nodule, no other evidence of metastatic disease.      HISTORY OF PRESENTING ILLNESS:  Greg [name] 75y.o. male is here because of stage III colon cancer. He was referred after his recent hospitalization.   He has been having  abdominal discomfort and nausea for the past few months, along with some blood in his stool. No significant weight loss or change of his appetite. He presented to the ED 02/27/15 for abdominal pain with x-ray showing possible obstruction but CT scan negative for obstruction or ileus but it did indicate an annular constricting lesion in the sigmoid. He was seen by GI Dr OPaulita Fujitaper colonoscopy failed due to the poor prep. There is warranted. He knows what He presented to ED again on 03/12/2015. CT abd/pelvis showed annular constricting lesion within the sigmoid colon with markedly dilated colon proximal to this lesion with air-fluid levels, compatible with distal colonic obstructing lesion concerning for malignancy as well as tiny hypodensities scattered throughout the liver, nonspecific but favor small cysts. General surgery was called and performed sigmoid colectomy with colostomy on 03/12/2015 by Dr. WHulen Skains The patient's post-operative course was complicated only by hypokalemia which was repeatedly replenished with IV and oral KCl. He received IV hydration and pain management and his diet was slowly advanced as tolerated. Physical and occupational therapy saw the patient and made recommendations. He was discharged to home with home health PT and ostomy care on 03/19/2015.   He has been recovering well from the surgery. He has no significant pain at the surgical site, no nausea, normal stool output in the colostomy bag. He has a mild to moderate fatigue, able to take care of himself and tolerating daily activities. He lives alone, uses walker as needed. He feels his energy level  has been recovered well. He has a brother who lives for 5 blocks away, and helps him out when he needs.  CURRENT THERAPY: Surveillance  INTERIM HISTORY: Greg [name] returns for follow up. He is doing well overall. He had bilateral cataract surgery in the past few months, and his vision has improved. He denies any significant pain, nausea,  or others new symptoms. His bowel movement is normal, no hematochezia. He lives independently, but does not drive. He has not been able to schedule his appointment with Dr. Hulen Skains for colostomy revision, due to transportation issues, but that he plans to do so when his brother calms here in the next month.   MEDICAL HISTORY:  Past Medical History:  Diagnosis Date  . 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 [name] (benign prostatic hyperplasia)    hyposensitive bladder, nodular prostate with increased PSA (10.23 in 03/2006), s/p prostate biopsy 2006 that was normal (Dr Jeffie Pollock) he recommended TURP.  . Cancer ([place])   . Chronic knee pain    left  . Epididymitis    right  . Frozen shoulder    left frozen shoulder  . Gingivitis   . Hyperlipidemia   . Hypertension   . Right shoulder pain    sx in 06/2005  . Shingles   . Sinus bradycardia     SURGICAL HISTORY: Past Surgical History:  Procedure Laterality Date  . COLOSTOMY N/A 03/12/2015   Procedure: COLOSTOMY;  Surgeon: Judeth Horn, MD;  Location: [facility];  Service: General;  Laterality: N/A;  . COLOSTOMY REVISION N/A 03/12/2015   Procedure:  RESECTION SIGMOID COLON;  Surgeon: Judeth Horn, MD;  Location: [facility];  Service: General;  Laterality: N/A;  . hydrocelectomy  11/14/2010   left hydrocelectomy  . KNEE SURGERY     L knee  . LAPAROTOMY N/A 03/12/2015   Procedure: EXPLORATORY LAPAROTOMY;  Surgeon: Judeth Horn, MD;  Location: [facility];  Service: General;  Laterality: N/A;  . right shoulder arthroscopic surgery  06/2005  . TRANSURETHRAL RESECTION OF PROSTATE     status post TURP 2/2 [name] in 2008  . VASECTOMY      SOCIAL HISTORY: Social History   Social History  . Marital status: Single    Spouse name: N/A  . Number of children: N/A  . Years of education: 52   Occupational History  .  Retired    retired   Social History  Main Topics  . Smoking status: Former Smoker    Packs/day: 0.50    Years: 25.00    Types: Cigarettes    Quit date: 02/07/1988  . Smokeless tobacco: Not on file  . Alcohol use 0.0 oz/week     Comment: occasional   . Drug use: No  . Sexual activity: Not on file   Other Topics Concern  . Not on file   Social History Narrative   Single, lives alone in apartment   No children   Several local siblings in area-will take him to grocery store, etc   Rides bus (75cents/trip) and bus is right outside his house   Prior employment [employer]   Reports strong faith helps him cope with life   Has had to self-cath twice daily since 2008 -Dr. Jeffie Pollock   [facility] for ostomy care and supplies   Ambulates w/walker-independent in ADL's-cooks pot pies and frozen dinners    FAMILY HISTORY: Family  History  Problem Relation Age of Onset  . Cancer Father     unknown type cancer     ALLERGIES:  is allergic to ciprofloxacin hcl and ibuprofen.  MEDICATIONS:  Current Outpatient Prescriptions  Medication Sig Dispense Refill  . gabapentin (NEURONTIN) 100 MG capsule Take 1-2 capsules (100-200 mg total) by mouth at bedtime. 60 capsule 1  . pravastatin (PRAVACHOL) 20 MG tablet Take 1 tablet (20 mg total) by mouth daily. 90 tablet 4  . sodium chloride (OCEAN) 0.65 % SOLN nasal spray Place 1 spray into both nostrils as needed for congestion. 1 Bottle 0  . sulfamethoxazole-trimethoprim (BACTRIM DS,SEPTRA DS) 800-160 MG tablet     . urea (CARMOL) 10 % cream Apply topically 3 (three) times daily. 71 g 0   No current facility-administered medications for this visit.     REVIEW OF SYSTEMS:   Constitutional: Denies fevers, chills or abnormal night sweats Eyes: Denies blurriness of vision, double vision or watery eyes Ears, nose, mouth, throat, and face: Denies mucositis or sore throat Respiratory: Denies cough, dyspnea or wheezes Cardiovascular: Denies palpitation, chest discomfort or lower  extremity swelling Gastrointestinal:  Denies nausea, heartburn or change in bowel habits Skin: see HPI  Lymphatics: Denies new lymphadenopathy or easy bruising Neurological:Denies numbness, tingling or new weaknesses Behavioral/Psych: Mood is stable, no new changes  All other systems were reviewed with the patient and are negative.  PHYSICAL EXAMINATION: ECOG PERFORMANCE STATUS: 1 - Symptomatic but completely ambulatory  Vitals:   05/18/16 0954  BP: (!) 150/84  Pulse: (!) 51  Resp: 18  Temp: 97.9 F (36.6 C)   Filed Weights   05/18/16 0954  Weight: 188 lb 8 oz (85.5 kg)    GENERAL:alert, no distress and comfortable SKIN: skin color, texture, turgor are normal, significant skin pigmentation, thickening, and mild skin peeling on the palms and feet EYES: normal, conjunctiva are pink and non-injected, sclera clear OROPHARYNX:no exudate, no erythema and lips, buccal mucosa, and tongue normal  NECK: supple, thyroid normal size, non-tender, without nodularity LYMPH:  no palpable lymphadenopathy in the cervical, axillary or inguinal LUNGS: clear to auscultation and percussion with normal breathing effort HEART: regular rate & rhythm and no murmurs and no lower extremity edema ABDOMEN:abdomen soft, non-tender and normal bowel sounds, surgical incision is well healed, (+) colostomy bag in the left abdomen. Musculoskeletal:no cyanosis of digits and no clubbing  PSYCH: alert & oriented x 3 with fluent speech NEURO: no focal motor/sensory deficits  LABORATORY DATA:  I have reviewed the data as listed  CBC Latest Ref Rng & Units 05/11/2016 02/16/2016 11/18/2015  WBC 4.0 - 10.3 10e3/uL 5.4 5.6 5.3  Hemoglobin 13.0 - 17.1 g/dL 14.6 14.5 14.3  Hematocrit 38.4 - 49.9 % 44.7 44.5 43.9  Platelets 140 - 400 10e3/uL 248 220 185    CMP Latest Ref Rng & Units 05/11/2016 02/16/2016 11/18/2015  Glucose 70 - 140 mg/dl 87 93 83  BUN 7.0 - 26.0 mg/dL 17.0 23.8 17.8  Creatinine 0.7 - 1.3 mg/dL 1.0 1.2  1.2  Sodium 136 - 145 mEq/L 139 139 139  Potassium 3.5 - 5.1 mEq/L 4.5 4.1 4.3  Chloride 101 - 111 mmol/L - - -  CO2 22 - 29 mEq/L 27 26 27   Calcium 8.4 - 10.4 mg/dL 9.0 9.0 9.0  Total Protein 6.4 - 8.3 g/dL 7.6 7.4 7.5  Total Bilirubin 0.20 - 1.20 mg/dL 0.51 0.34 0.73  Alkaline Phos 40 - 150 U/L 82 77 79  AST 5 -  34 U/L 19 17 14   ALT 0 - 55 U/L 12 12 9    CEA  Order: [id number]  Status:  Final result Visible to patient:  No (Not Released) Next appt:  Today at 09:45 AM in Oncology [facility] Medico, Krista Blue, MD) Dx:  Cancer of sigmoid colon [facility])    Ref Range & Units 7d ago 40moago   CEA 0.0 - 4.7 ng/mL 2.6 2.3CM  Comments:           Roche ECLIA methodology    Nonsmokers <3.9         PATHOLOGY REPORT  Diagnosis 03/12/2015 Colon, segmental resection for tumor, sigmoid colon - INVASIVE WELL-DIFFERENTIATED ADENOCARCINOMA WITH ASSOCIATED ABUNDANT MUCIN, SPANNING 7 CM IN GREATEST DIMENSION. 1 of 4 - TUMOR INVADES THROUGH MUSCULARIS PROPRIA THROUGH PERICOLORECTAL SOFT TISSUES TO INVOLVE SEROSAL SURFACE. - MARGINS ARE NEGATIVE. - FOURTEEN BENIGN LYMPH NODES WITH NO TUMOR SEEN (0/14). - SINGLE SOFT TISSUE SATELLITE  Microscopic Comment COLON AND RECTUM: Specimen: Sigmoid colon. Procedure: Resection of sigmoid colon. Tumor site: Sigmoid colon, distal aspect of specimen. Specimen integrity: Intact.  Macroscopic tumor perforation: Not identified. Invasive tumor: Maximum size: 7 cm. Histologic type(s): Invasive adenocarcinoma with abundant extracellular mucin. Histologic grade and differentiation: G1: well differentiated/low grade Type of polyp in which invasive carcinoma arose: Precursor polyp is not identified. Microscopic extension of invasive tumor: Tumor invades through muscularis propria through pericolorectal soft tissues to involve serosal surface. Lymph-Vascular invasion: Definitive lymph/vascular invasion is not identified; however, a tumor deposit is present,  see below. Peri-neural invasion: Not identified. Tumor deposit(s) (discontinuous extramural extension): Yes, tumor deposit present. Resection margins: Proximal margin: Negative. Distal margin: Negative. Mesenteric margin (sigmoid and transverse): Negative. Distance closest margin (if all above margins negative): 3.5 cm (mesenteric margin). Treatment effect (neo-adjuvant therapy): Not applicable. Additional polyp(s): No additional polyps identified. Non-neoplastic findings: No significant non-neoplastic findings. Lymph nodes: number examined 14; number positive: 0. Pathologic Staging: pT4a, pN1c. Ancillary studies: Per colorectal cancer protocol, MMR by IHC and MSI by PCR will be performed on the current tumor. (RH:ecj 03/16/2015)  ADDITIONAL INFORMATION: Mismatch Repair (MMR) Protein Immunohistochemistry (IHC) IHC Expression Result: MLH1: Preserved nuclear expression (greater 50% tumor expression) MSH2: Preserved nuclear expression (greater 50% tumor expression) MSH6: Preserved nuclear expression (greater 50% tumor expression) PMS2: Preserved nuclear expression (greater 50% tumor expression) * Internal control demonstrates intact nuclear expression Interpretation: NORMAL   RADIOGRAPHIC STUDIES: I have personally reviewed the radiological images as listed and agreed with the findings in the report.  Ct chest Abdomen Pelvis W Contrast 12/02/2015   IMPRESSION: 1. Slight interval decrease in left pelvic sidewall lymph nodes seen previously. Continued attention on follow-up recommended. 2. Mildly increased conspicuity of the tiny low-density hepatic dome lesion. Attention on follow-up recommended. 3. Otherwise no findings to suggest definite metastatic disease in the abdomen or pelvis. 4. Bladder distention, stable.  ASSESSMENT & PLAN: 75year old African-American male  1. Sigmoid colon cancer, pT4N1cM0, stage IIIB, grade 1, MMR normal -I reviewed his initial CT abdomen and  pelvis findings, and his surgical pathology results in great details with the patient. -Giving the advanced stage IIIB disease, he does have moderate to high risk of cancer recurrence after surgery. -He has completed adjuvant Xeloda, tolerated very well.  -I reviewed his restaging CT abdomen and pelvis performed last week, no definitive evidence of cancer recurrence. Left external iliac lymph node has decreased in size, the tiny lesion in the liver performed has been overall stable over a year, likely benign. -I'll continue surveillance,  I'll see him back in 3 months with lab  -I encourage him to follow up with Dr. Hulen Skains for colonoscopy and colostomy revision. He plans to do so in the next few months when his brother is here.   2.  Atony of bladder -He does straight cath twice a day, and on chronic Bactrim to prevent UTI. -He'll continue follow-up with his urologist.  3. Hypertension, arthritis -He will continue follow-up with his primary care physician   Plan -he will follow up with his surgeon Dr. Hulen Skains for colonoscopy and colostomy revision  -Return to clinic in 3 months with lab   All questions were answered. The patient knows to call the clinic with any problems, questions or concerns. I spent 20 minutes counseling the patient face to face. The total time spent in the appointment was 25 minutes and more than 50% was on counseling.     Truitt Merle, MD 05/18/2016

## 2016-05-19 ENCOUNTER — Encounter: Payer: Self-pay | Admitting: Hematology

## 2016-06-02 ENCOUNTER — Encounter (HOSPITAL_COMMUNITY): Payer: Self-pay | Admitting: *Deleted

## 2016-06-02 ENCOUNTER — Emergency Department (HOSPITAL_COMMUNITY)
Admission: EM | Admit: 2016-06-02 | Discharge: 2016-06-03 | Disposition: A | Discharge status: Home / Self Care | Payer: Medicare Other | Attending: Emergency Medicine | Admitting: Emergency Medicine

## 2016-06-02 DIAGNOSIS — Z87891 Personal history of nicotine dependence: Secondary | ICD-10-CM | POA: Insufficient documentation

## 2016-06-02 DIAGNOSIS — Z85038 Personal history of other malignant neoplasm of large intestine: Secondary | ICD-10-CM | POA: Diagnosis not present

## 2016-06-02 DIAGNOSIS — Q846 Other congenital malformations of nails: Secondary | ICD-10-CM | POA: Diagnosis not present

## 2016-06-02 DIAGNOSIS — M25572 Pain in left ankle and joints of left foot: Secondary | ICD-10-CM | POA: Diagnosis present

## 2016-06-02 DIAGNOSIS — I10 Essential (primary) hypertension: Secondary | ICD-10-CM | POA: Insufficient documentation

## 2016-06-02 LAB — CBG MONITORING, ED: GLUCOSE-CAPILLARY: 88 mg/dL (ref 65–99)

## 2016-06-02 MED ORDER — SULFAMETHOXAZOLE-TRIMETHOPRIM 800-160 MG PO TABS: 1.0000 | ORAL_TABLET | Freq: Two times a day (BID) | ORAL | 0 refills | Status: AC

## 2016-06-02 MED ORDER — LIDOCAINE HCL 2 % IJ SOLN
20.0000 mL | Freq: Once | INTRAMUSCULAR | Status: AC
  Administered 2016-06-02: 400 mg via INTRADERMAL
  Filled 2016-06-02: qty 20

## 2016-06-02 NOTE — ED Triage Notes (Signed)
Pt c/o left great toe pain. Pt presents with a red swollen toe, nail bed black. Pt denies any recent injury to toe. Pt is not a diabetic.

## 2016-06-02 NOTE — Discharge Instructions (Signed)
Please read and follow all provided instructions.  Your diagnoses today include:  1. Eponychia     Tests performed today include:  Vital signs. See below for your results today.   Medications prescribed:   Bactrim (trimethoprim/sulfamethoxazole) - antibiotic  You have been prescribed an antibiotic medicine: take the entire course of medicine even if you are feeling better. Stopping early can cause the antibiotic not to work.  Take any prescribed medications only as directed.   Home care instructions:   Follow any educational materials contained in this packet  Follow-up instructions: Return to the Emergency Department in 48 hours for a recheck if your symptoms are not significantly improved.  Please follow-up with your primary care provider in the next 1 week for further evaluation of your symptoms.   Return instructions:  Return to the Emergency Department if you have:  Fever  Worsening symptoms  Worsening pain  Worsening swelling  Redness of the skin that moves away from the affected area, especially if it streaks away from the affected area   Any other emergent concerns   Your vital signs today were: BP 152/83 (BP Location: Left Arm)    Pulse 62    Temp 98.7 F (37.1 C) (Oral)    Resp 18    Ht 5\' 11"  (1.803 m)    Wt 86.6 kg    SpO2 100%    BMI 26.64 kg/m  If your blood pressure (BP) was elevated above 135/85 this visit, please have this repeated by your doctor within one month. --------------

## 2016-06-02 NOTE — ED Provider Notes (Signed)
[facility] DEPT Provider Note   CSN: [id number] Arrival date & time: 06/02/16  2045  First Provider Contact:  First MD Initiated Contact with Patient 06/02/16 2300        History   Chief Complaint Chief Complaint  Patient presents with  . Foot Pain    HPI Greg [name] is a 75 y.o. male.  Patient with history of [name], colon cancer, no peripheral arterial disease history, no diabetes -- presents with swelling of his left great toe starting one day ago. Patient complains of pain. No fevers, nausea, vomiting, or diarrhea. Patient denies claudication symptoms. Mo treatments prior to arrival. The onset of this condition was acute. The course is constant. Aggravating factors: none. Alleviating factors: none. Patient is on daily Bactrim DS for prophylaxis for prostate infection.        Past Medical History:  Diagnosis Date  . 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 [name] (benign prostatic hyperplasia)    hyposensitive bladder, nodular prostate with increased PSA (10.23 in 03/2006), s/p prostate biopsy 2006 that was normal (Dr Jeffie Pollock) he recommended TURP.  . Cancer ([place])   . Chronic knee pain    left  . Epididymitis    right  . Frozen shoulder    left frozen shoulder  . Gingivitis   . Hyperlipidemia   . Hypertension   . Right shoulder pain    sx in 06/2005  . Shingles   . Sinus bradycardia     Patient Active Problem List   Diagnosis Date Noted  . 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 Viral URI 08/25/2015  . Cancer of sigmoid colon ([place]) 03/19/2015  . Allergic rhinitis 03/11/2014  . Healthcare maintenance 09/08/2012  . HLD (hyperlipidemia) 12/06/2008  . Chronic sinus bradycardia 07/27/2008  . ATONY OF BLADDER 03/18/2008  . Essential hypertension 11/04/2006  . [name] (benign prostatic hypertrophy) 11/04/2006    Past Surgical  History:  Procedure Laterality Date  . COLOSTOMY N/A 03/12/2015   Procedure: COLOSTOMY;  Surgeon: Judeth Horn, MD;  Location: [place];  Service: General;  Laterality: N/A;  . COLOSTOMY REVISION N/A 03/12/2015   Procedure:  RESECTION SIGMOID COLON;  Surgeon: Judeth Horn, MD;  Location: [place];  Service: General;  Laterality: N/A;  . hydrocelectomy  11/14/2010   left hydrocelectomy  . KNEE SURGERY     L knee  . LAPAROTOMY N/A 03/12/2015   Procedure: EXPLORATORY LAPAROTOMY;  Surgeon: Judeth Horn, MD;  Location: [place];  Service: General;  Laterality: N/A;  . right shoulder arthroscopic surgery  06/2005  . TRANSURETHRAL RESECTION OF PROSTATE     status post TURP 2/2 [name] in 2008  . VASECTOMY         Home Medications    Prior to Admission medications   Medication Sig Start Date End Date Taking? Authorizing Provider  gabapentin (NEURONTIN) 100 MG capsule Take 1-2 capsules (100-200 mg total) by mouth at bedtime. 07/14/15   Truitt Merle, MD  pravastatin (PRAVACHOL) 20 MG tablet Take 1 tablet (20 mg total) by mouth daily. 07/07/15   Norman Herrlich, MD  sodium chloride (OCEAN) 0.65 % SOLN nasal spray Place 1 spray into both nostrils as needed for congestion. 10/20/15   Jones Bales, MD  sulfamethoxazole-trimethoprim (BACTRIM DS,SEPTRA DS) 800-160 MG tablet Take 1 tablet by mouth at bedtime.  08/09/15   Historical Provider, MD  urea (CARMOL)  10 % cream Apply topically 3 (three) times daily. 09/30/15   Truitt Merle, MD    Family History Family History  Problem Relation Age of Onset  . Cancer Father     unknown type cancer     Social History Social History  Substance Use Topics  . Smoking status: Former Smoker    Packs/day: 0.50    Years: 25.00    Types: Cigarettes    Quit date: 02/07/1988  . Smokeless tobacco: Never Used  . Alcohol use 0.0 oz/week     Comment: occasional      Allergies   Ciprofloxacin hcl and Ibuprofen   Review of Systems Review of Systems  Constitutional: Negative for  fever.  HENT: Negative for rhinorrhea and sore throat.   Eyes: Negative for redness.  Respiratory: Negative for cough.   Cardiovascular: Negative for chest pain.  Gastrointestinal: Negative for abdominal pain, diarrhea, nausea and vomiting.  Genitourinary: Negative for dysuria.  Musculoskeletal: Positive for myalgias.  Skin: Positive for color change and wound. Negative for rash.  Neurological: Negative for headaches.     Physical Exam Updated Vital Signs BP 152/83 (BP Location: Left Arm)   Pulse 62   Temp 98.7 F (37.1 C) (Oral)   Resp 18   Ht 5\' 11"  (1.803 m)   Wt 86.6 kg   SpO2 100%   BMI 26.64 kg/m   Physical Exam  Constitutional: He appears well-developed and well-nourished.  HENT:  Head: Normocephalic and atraumatic.  Eyes: Conjunctivae are normal.  Neck: Normal range of motion. Neck supple.  Cardiovascular: Normal rate, regular rhythm and normal pulses.  Exam reveals no friction rub and no decreased pulses.   No murmur heard. Musculoskeletal: He exhibits tenderness. He exhibits no edema.  Neurological: He is alert. No sensory deficit.  Motor, sensation, and vascular distal to the injury is fully intact.   Skin: Skin is warm and dry.  Eponychia of left great toe  Psychiatric: He has a normal mood and affect.  Nursing note and vitals reviewed.    ED Treatments / Results  Labs (all labs ordered are listed, but only abnormal results are displayed) Labs Reviewed - No data to display  EKG  EKG Interpretation None       Radiology No results found.  Procedures .Marland KitchenIncision and Drainage Date/Time: 06/02/2016 11:47 PM Performed by: Carlisle Cater Authorized by: Carlisle Cater   Consent:    Consent given by:  Patient   Risks discussed:  Infection Location:    Indications for incision and drainage: eponychia.   Size:  2 cm   Location:  Lower extremity   Lower extremity location:  Foot   Foot location:  L foot Pre-procedure details:    Skin  preparation:  Betadine Anesthesia (see MAR for exact dosages):    Anesthesia method:  Local infiltration   Local anesthetic:  Lidocaine 1% w/o epi Procedure details:    Needle aspiration: no     Incision types:  Stab incision   Incision depth:  Subcutaneous   Scalpel blade:  11   Wound management:  Probed and deloculated   Drainage:  Purulent   Drainage amount:  Moderate   Wound treatment:  Wound left open Post-procedure details:    Patient tolerance of procedure:  Tolerated well, no immediate complications    (including critical care time)  Medications Ordered in ED Medications  lidocaine (XYLOCAINE) 2 % (with pres) injection 400 mg (not administered)     Initial Impression / Assessment and Plan /  ED Course  I have reviewed the triage vital signs and the nursing notes.  Pertinent labs & imaging results that were available during my care of the patient were reviewed by me and considered in my medical decision making (see chart for details).  Clinical Course   11:49 PM incision and drainage performed without complication. Patient will take Bactrim twice a day instead of once a day for the next 7 days.  The patient was urged to return to the Emergency Department urgently with worsening pain, swelling, expanding erythema especially if it streaks away from the affected area, fever, or if they have any other concerns.   The patient was urged to return to the Emergency Department or go to their PCP in 48 hours for wound recheck if the area is not significantly improved.  The patient verbalized understanding and stated agreement with this plan.     Final Clinical Impressions(s) / ED Diagnoses   Final diagnoses:  Eponychia   Patient with uncomplicated eponychia, drained without complication. No systemic illness. No extensive cellulitis or lymphangitis. Do not suspect PAD. No DM.    New Prescriptions New Prescriptions   SULFAMETHOXAZOLE-TRIMETHOPRIM (BACTRIM DS,SEPTRA DS)  800-160 MG TABLET    Take 1 tablet by mouth 2 (two) times daily.     Carlisle Cater, PA-C 06/02/16 [place], PA-C 06/02/16 2358    Everlene Balls, MD 06/03/16 [phone]

## 2016-08-01 ENCOUNTER — Other Ambulatory Visit: Payer: Self-pay | Admitting: Internal Medicine

## 2016-08-01 MED ORDER — PRAVASTATIN SODIUM 20 MG PO TABS: 20.0000 mg | ORAL_TABLET | Freq: Every day | ORAL | 4 refills | Status: DC

## 2016-08-01 NOTE — Telephone Encounter (Signed)
Refill request TakingNot TakingUnknown  pravastatin (PRAVACHOL) 20 MG tablet

## 2016-08-01 NOTE — Telephone Encounter (Signed)
Refill request forwarded to pcp for review, also needs an appt with pcp-request sent to front office.Despina Hidden Cassady10/11/20172:55 PM

## 2016-08-17 ENCOUNTER — Ambulatory Visit (HOSPITAL_BASED_OUTPATIENT_CLINIC_OR_DEPARTMENT_OTHER): Payer: Medicare Other | Admitting: Hematology

## 2016-08-17 ENCOUNTER — Other Ambulatory Visit (HOSPITAL_BASED_OUTPATIENT_CLINIC_OR_DEPARTMENT_OTHER): Payer: Medicare Other

## 2016-08-17 ENCOUNTER — Encounter: Payer: Self-pay | Admitting: Hematology

## 2016-08-17 ENCOUNTER — Telehealth: Payer: Self-pay | Admitting: Hematology

## 2016-08-17 VITALS — BP 158/82 | HR 50 | Temp 97.7°F | Resp 18 | Ht 71.0 in | Wt 194.7 lb

## 2016-08-17 DIAGNOSIS — C187 Malignant neoplasm of sigmoid colon: Secondary | ICD-10-CM

## 2016-08-17 DIAGNOSIS — N312 Flaccid neuropathic bladder, not elsewhere classified: Secondary | ICD-10-CM

## 2016-08-17 DIAGNOSIS — C189 Malignant neoplasm of colon, unspecified: Secondary | ICD-10-CM

## 2016-08-17 DIAGNOSIS — M1990 Unspecified osteoarthritis, unspecified site: Secondary | ICD-10-CM

## 2016-08-17 DIAGNOSIS — I10 Essential (primary) hypertension: Secondary | ICD-10-CM | POA: Diagnosis not present

## 2016-08-17 LAB — CBC WITH DIFFERENTIAL/PLATELET
BASO%: 0.4 % (ref 0.0–2.0)
Basophils Absolute: 0 10*3/uL (ref 0.0–0.1)
EOS%: 1.7 % (ref 0.0–7.0)
Eosinophils Absolute: 0.1 10*3/uL (ref 0.0–0.5)
HCT: 44.7 % (ref 38.4–49.9)
HEMOGLOBIN: 14.7 g/dL (ref 13.0–17.1)
LYMPH%: 29.8 % (ref 14.0–49.0)
MCH: 30.8 pg (ref 27.2–33.4)
MCHC: 32.9 g/dL (ref 32.0–36.0)
MCV: 93.7 fL (ref 79.3–98.0)
MONO#: 0.5 10*3/uL (ref 0.1–0.9)
MONO%: 8.6 % (ref 0.0–14.0)
NEUT%: 59.5 % (ref 39.0–75.0)
NEUTROS ABS: 3.2 10*3/uL (ref 1.5–6.5)
Platelets: 223 10*3/uL (ref 140–400)
RBC: 4.77 10*6/uL (ref 4.20–5.82)
RDW: 13.4 % (ref 11.0–14.6)
WBC: 5.4 10*3/uL (ref 4.0–10.3)
lymph#: 1.6 10*3/uL (ref 0.9–3.3)

## 2016-08-17 LAB — COMPREHENSIVE METABOLIC PANEL
ALT: 11 U/L (ref 0–55)
ANION GAP: 7 meq/L (ref 3–11)
AST: 18 U/L (ref 5–34)
Albumin: 3.6 g/dL (ref 3.5–5.0)
Alkaline Phosphatase: 95 U/L (ref 40–150)
BILIRUBIN TOTAL: 0.44 mg/dL (ref 0.20–1.20)
BUN: 17.6 mg/dL (ref 7.0–26.0)
CHLORIDE: 106 meq/L (ref 98–109)
CO2: 24 meq/L (ref 22–29)
CREATININE: 0.9 mg/dL (ref 0.7–1.3)
Calcium: 8.6 mg/dL (ref 8.4–10.4)
EGFR: >90 mL/min/{1.73_m2} (ref >90)
GLUCOSE: 98 mg/dL (ref 70–140)
Potassium: 3.9 mEq/L (ref 3.5–5.1)
SODIUM: 138 meq/L (ref 136–145)
TOTAL PROTEIN: 7.8 g/dL (ref 6.4–8.3)

## 2016-08-17 LAB — CEA (IN HOUSE-CHCC): CEA (CHCC-IN HOUSE): 1.46 ng/mL (ref 0.00–5.00)

## 2016-08-17 NOTE — Telephone Encounter (Signed)
Appointment letter and scheduled mailed to patient, per 08/17/16 los.

## 2016-08-17 NOTE — Progress Notes (Signed)
[facility]  [phone] [phone]  Clinic Follow up Note   Patient Care Team: Norman Herrlich, MD as PCP - General Judeth Horn, MD as Consulting Physician (General Surgery) Truitt Merle, MD as Consulting Physician (Hematology) Irine Seal, MD as Attending Physician (Urology) Tania Ade, RN as Registered Nurse (Medical Oncology) 08/17/2016  CHIEF COMPLAINTS:  Follow up stage III colon cancer   Oncology History   Colon adenocarcinoma   Staging form: Colon and Rectum, AJCC 7th Edition     Pathologic stage from 03/12/2015: Stage IIIB (T4a, N1c, cM0) - Signed by Truitt Merle, MD on 04/05/2015        Cancer of sigmoid colon ([place])   02/27/2015 Imaging    CT abdomen and pelvis showed an annular constricting lesion within the sigmoid colon, no significant adenopathy or distant metastasis. CT abdomen and pelvis on 03/11/2015 showed similar findings with obstruction.      03/12/2015 Initial Diagnosis    Sigmoid colon adenocarcinoma      03/12/2015 Tumor Marker    CEA normal 2.5      03/12/2015 Surgery    Sigmoid colon segmental resection by Dr. Excell Seltzer, surgical margins were negative.      03/12/2015 Pathology Results    Well differentiated invasive adenocarcinoma with associated abundant mucin, 14 lymph nodes were negative, a tumor deposit was present, surgical margins were negative. pT4N1c      04/26/2015 - 10/20/2015 Chemotherapy    Capecitabine po '2000mg'$  q12h, 2 weeks on, one week off, as adjuvant chemo       12/02/2015 Imaging    CT chest, abdomen and pelvis with contrast showed a moderately enlarged left external iliac lymph node, 1 cm, indeterminate, interval decrease size of previously described subcentimeter perirectal nodule, no other evidence of metastatic disease.       HISTORY OF PRESENTING ILLNESS:  Greg [name] 75 y.o. male is here because of stage III colon cancer. He was referred after his recent hospitalization.   He has  been having abdominal discomfort and nausea for the past few months, along with some blood in his stool. No significant weight loss or change of his appetite. He presented to the ED 02/27/15 for abdominal pain with x-ray showing possible obstruction but CT scan negative for obstruction or ileus but it did indicate an annular constricting lesion in the sigmoid. He was seen by GI Dr Paulita Fujita per colonoscopy failed due to the poor prep. There is warranted. He knows what He presented to ED again on 03/12/2015. CT abd/pelvis showed annular constricting lesion within the sigmoid colon with markedly dilated colon proximal to this lesion with air-fluid levels, compatible with distal colonic obstructing lesion concerning for malignancy as well as tiny hypodensities scattered throughout the liver, nonspecific but favor small cysts. General surgery was called and performed sigmoid colectomy with colostomy on 03/12/2015 by Dr. Hulen Skains. The patient's post-operative course was complicated only by hypokalemia which was repeatedly replenished with IV and oral KCl. He received IV hydration and pain management and his diet was slowly advanced as tolerated. Physical and occupational therapy saw the patient and made recommendations. He was discharged to home with home health PT and ostomy care on 03/19/2015.   He has been recovering well from the surgery. He has no significant pain at the surgical site, no nausea, normal stool output in the colostomy bag. He has a mild to moderate fatigue, able to take care of himself and tolerating daily activities. He lives alone, uses walker  as needed. He feels his energy level has been recovered well. He has a brother who lives for 5 blocks away, and helps him out when he needs.  CURRENT THERAPY: Surveillance  INTERIM HISTORY: Greg [name] returns for follow up. He is doing well overall. He denies any significant pain, abdominal discomfort, nausea or change of his bowel habits. He has good appetite and  energy level. No other new complaints. His vision has much improved since his cataract surgery. He does want to have the colostomy revision surgery, but has not been able to get a ride to do the study before surgery, and he has not seen Dr. Hulen Skains back yet.  MEDICAL HISTORY:  Past Medical History:  Diagnosis Date  . 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 [name] (benign prostatic hyperplasia)    hyposensitive bladder, nodular prostate with increased PSA (10.23 in 03/2006), s/p prostate biopsy 2006 that was normal (Dr Jeffie Pollock) he recommended TURP.  . Cancer ([place])   . Chronic knee pain    left  . Epididymitis    right  . Frozen shoulder    left frozen shoulder  . Gingivitis   . Hyperlipidemia   . Hypertension   . Right shoulder pain    sx in 06/2005  . Shingles   . Sinus bradycardia     SURGICAL HISTORY: Past Surgical History:  Procedure Laterality Date  . COLOSTOMY N/A 03/12/2015   Procedure: COLOSTOMY;  Surgeon: Judeth Horn, MD;  Location: [place];  Service: General;  Laterality: N/A;  . COLOSTOMY REVISION N/A 03/12/2015   Procedure:  RESECTION SIGMOID COLON;  Surgeon: Judeth Horn, MD;  Location: [place];  Service: General;  Laterality: N/A;  . hydrocelectomy  11/14/2010   left hydrocelectomy  . KNEE SURGERY     L knee  . LAPAROTOMY N/A 03/12/2015   Procedure: EXPLORATORY LAPAROTOMY;  Surgeon: Judeth Horn, MD;  Location: [place];  Service: General;  Laterality: N/A;  . right shoulder arthroscopic surgery  06/2005  . TRANSURETHRAL RESECTION OF PROSTATE     status post TURP 2/2 [name] in 2008  . VASECTOMY      SOCIAL HISTORY: Social History   Social History  . Marital status: Single    Spouse name: N/A  . Number of children: N/A  . Years of education: 12   Occupational History  .  Retired    retired   Social History Main Topics  . Smoking status: Former Smoker     Packs/day: 0.50    Years: 25.00    Types: Cigarettes    Quit date: 02/07/1988  . Smokeless tobacco: Never Used  . Alcohol use 0.0 oz/week     Comment: occasional   . Drug use: No  . Sexual activity: Not on file   Other Topics Concern  . Not on file   Social History Narrative   Single, lives alone in apartment   No children   Several local siblings in area-will take him to grocery store, etc   Rides bus (75cents/trip) and bus is right outside his house   Prior employment [employer]   Reports strong faith helps him cope with life   Has had to self-cath twice daily since 2008 -Dr. Jeffie Pollock   [facility] for ostomy care and supplies   Ambulates w/walker-independent in ADL's-cooks pot pies and frozen dinners    FAMILY HISTORY: Family History  Problem Relation Age of Onset  . Cancer Father     unknown type cancer     ALLERGIES:  is allergic to ciprofloxacin hcl and ibuprofen.  MEDICATIONS:  Current Outpatient Prescriptions  Medication Sig Dispense Refill  . cetirizine (ZYRTEC) 10 MG tablet Take 10 mg by mouth at bedtime.    . pravastatin (PRAVACHOL) 20 MG tablet Take 1 tablet (20 mg total) by mouth daily. 90 tablet 4  . sulfamethoxazole-trimethoprim (BACTRIM DS,SEPTRA DS) 800-160 MG tablet Take 1 tablet by mouth at bedtime.      No current facility-administered medications for this visit.     REVIEW OF SYSTEMS:   Constitutional: Denies fevers, chills or abnormal night sweats Eyes: Denies blurriness of vision, double vision or watery eyes Ears, nose, mouth, throat, and face: Denies mucositis or sore throat Respiratory: Denies cough, dyspnea or wheezes Cardiovascular: Denies palpitation, chest discomfort or lower extremity swelling Gastrointestinal:  Denies nausea, heartburn or change in bowel habits Skin: see HPI  Lymphatics: Denies new lymphadenopathy or easy bruising Neurological:Denies numbness, tingling or new weaknesses Behavioral/Psych: Mood is stable, no  new changes  All other systems were reviewed with the patient and are negative.  PHYSICAL EXAMINATION: ECOG PERFORMANCE STATUS: 1 - Symptomatic but completely ambulatory  Vitals:   08/17/16 1020  BP: (!) 158/82  Pulse: (!) 50  Resp: 18  Temp: 97.7 F (36.5 C)   Filed Weights   08/17/16 1020  Weight: 194 lb 11.2 oz (88.3 kg)    GENERAL:alert, no distress and comfortable SKIN: skin color, texture, turgor are normal, significant skin pigmentation, thickening, and mild skin peeling on the palms and feet EYES: normal, conjunctiva are pink and non-injected, sclera clear OROPHARYNX:no exudate, no erythema and lips, buccal mucosa, and tongue normal  NECK: supple, thyroid normal size, non-tender, without nodularity LYMPH:  no palpable lymphadenopathy in the cervical, axillary or inguinal LUNGS: clear to auscultation and percussion with normal breathing effort HEART: regular rate & rhythm and no murmurs and no lower extremity edema ABDOMEN:abdomen soft, non-tender and normal bowel sounds, surgical incision is well healed, (+) colostomy bag in the left abdomen. Musculoskeletal:no cyanosis of digits and no clubbing  PSYCH: alert & oriented x 3 with fluent speech NEURO: no focal motor/sensory deficits  LABORATORY DATA:  I have reviewed the data as listed  CBC Latest Ref Rng & Units 08/17/2016 05/11/2016 02/16/2016  WBC 4.0 - 10.3 10e3/uL 5.4 5.4 5.6  Hemoglobin 13.0 - 17.1 g/dL 14.7 14.6 14.5  Hematocrit 38.4 - 49.9 % 44.7 44.7 44.5  Platelets 140 - 400 10e3/uL 223 248 220    CMP Latest Ref Rng & Units 08/17/2016 05/11/2016 02/16/2016  Glucose 70 - 140 mg/dl 98 87 93  BUN 7.0 - 26.0 mg/dL 17.6 17.0 23.8  Creatinine 0.7 - 1.3 mg/dL 0.9 1.0 1.2  Sodium 136 - 145 mEq/L 138 139 139  Potassium 3.5 - 5.1 mEq/L 3.9 4.5 4.1  Chloride 101 - 111 mmol/L - - -  CO2 22 - 29 mEq/L _0 Calcium 8.4 - 10.4 mg/dL 8.6 9.0 9.0  Total Protein 6.4 - 8.3 g/dL 7.8 7.6 7.4  Total Bilirubin 0.20 -  1.20 mg/dL 0.44 0.51 0.34  Alkaline Phos 40 - 150 U/L 95 82 77  AST 5 - 34 U/L _1 ALT 0 - 55 U/L _2 CEA  Results for DOMINYCK, RESER (MRN [id number]) as of 08/17/2016 11:09  Ref. Range 02/16/2016 08:58 05/11/2016 09:41 08/17/2016 09:28  CEA Latest Ref Range: 0.0 - 4.7 ng/mL 2.3 2.6   CEA ([facility]) Latest Ref Range: 0.00 - 5.00 ng/mL   1.46    PATHOLOGY REPORT  Diagnosis 03/12/2015 Colon, segmental resection for tumor, sigmoid colon - INVASIVE WELL-DIFFERENTIATED ADENOCARCINOMA WITH ASSOCIATED ABUNDANT MUCIN, SPANNING 7 CM IN GREATEST DIMENSION. 1 of 4 - TUMOR INVADES THROUGH MUSCULARIS PROPRIA THROUGH PERICOLORECTAL SOFT TISSUES TO INVOLVE SEROSAL SURFACE. - MARGINS ARE NEGATIVE. - FOURTEEN BENIGN LYMPH NODES WITH NO TUMOR SEEN (0/14). - SINGLE SOFT TISSUE SATELLITE  Microscopic Comment COLON AND RECTUM: Specimen: Sigmoid colon. Procedure: Resection of sigmoid colon. Tumor site: Sigmoid colon, distal aspect of specimen. Specimen integrity: Intact.  Macroscopic tumor perforation: Not identified. Invasive tumor: Maximum size: 7 cm. Histologic type(s): Invasive adenocarcinoma with abundant extracellular mucin. Histologic grade and differentiation: G1: well differentiated/low grade Type of polyp in which invasive carcinoma arose: Precursor polyp is not identified. Microscopic extension of invasive tumor: Tumor invades through muscularis propria through pericolorectal soft tissues to involve serosal surface. Lymph-Vascular invasion: Definitive lymph/vascular invasion is not identified; however, a tumor deposit is present, see below. Peri-neural invasion: Not identified. Tumor deposit(s) (discontinuous extramural extension): Yes, tumor deposit present. Resection margins: Proximal margin: Negative. Distal margin: Negative. Mesenteric margin (sigmoid and transverse): Negative. Distance closest margin (if all above margins negative): 3.5 cm (mesenteric  margin). Treatment effect (neo-adjuvant therapy): Not applicable. Additional polyp(s): No additional polyps identified. Non-neoplastic findings: No significant non-neoplastic findings. Lymph nodes: number examined 14; number positive: 0. Pathologic Staging: pT4a, pN1c. Ancillary studies: Per colorectal cancer protocol, MMR by IHC and MSI by PCR will be performed on the current tumor. (RH:ecj 03/16/2015)  ADDITIONAL INFORMATION: Mismatch Repair (MMR) Protein Immunohistochemistry (IHC) IHC Expression Result: MLH1: Preserved nuclear expression (greater 50% tumor expression) MSH2: Preserved nuclear expression (greater 50% tumor expression) MSH6: Preserved nuclear expression (greater 50% tumor expression) PMS2: Preserved nuclear expression (greater 50% tumor expression) * Internal control demonstrates intact nuclear expression Interpretation: NORMAL   RADIOGRAPHIC STUDIES: I have personally reviewed the radiological images as listed and agreed with the findings in the report.  Ct Abdomen Pelvis W Contrast 05/11/2016 IMPRESSION: 1. Slight interval decrease in left pelvic sidewall lymph nodes seen previously. Continued attention on follow-up recommended. 2. Mildly increased conspicuity of the tiny low-density hepatic dome lesion. Attention on follow-up recommended. 3. Otherwise no findings to suggest definite metastatic disease in the abdomen or pelvis. 4. Bladder distention, stable.  ASSESSMENT & PLAN: 75 year old African-American male  1. Sigmoid colon cancer, pT4N1cM0, stage IIIB, grade 1, MMR normal -I reviewed his initial CT abdomen and pelvis findings, and his surgical pathology results in great details with the patient. -Giving the advanced stage IIIB disease, he does have moderate to high risk of cancer recurrence after surgery. -He has completed adjuvant Xeloda, tolerated very well.  -I reviewed his restaging CT abdomen and pelvis from 05/11/2016, no definitive evidence of  cancer recurrence. Left external iliac lymph node has decreased in size, the tiny lesion in the liver performed has been overall stable over a year, likely benign. -Is currently doing well, exam was unremarkable, lab CBC, CMP and CEA are within normal limits. No clinical concern for recurrence. -I'll continue surveillance, I'll see him back in 3 months with lab and scan  -I encourage him to follow up with Dr. Hulen Skains for colonoscopy and colostomy revision. He does want to have the surgery, but needs a ride for the appointment, he is still looking for a ride, I encouraged him to contact our social  worker to see if we can find him a volunteer to drive him to his appointments. He is overdue for the colonoscopy.   2.  Atony of bladder -He does straight cath twice a day, and on chronic Bactrim to prevent UTI. -He'll continue follow-up with his urologist.  3. Hypertension, arthritis -He will continue follow-up with his primary care physician    Plan -he will follow up with his surgeon Dr. Hulen Skains for colonoscopy and colostomy revision when he has a ride  -Return to clinic in 3 months with lab and CT CAP with contrast a few days before    All questions were answered. The patient knows to call the clinic with any problems, questions or concerns. I spent 20 minutes counseling the patient face to face. The total time spent in the appointment was 25 minutes and more than 50% was on counseling.     Truitt Merle, MD 08/17/2016

## 2016-08-18 LAB — CEA: CEA: 2.4 ng/mL (ref 0.0–4.7)

## 2016-09-19 ENCOUNTER — Telehealth: Payer: Self-pay | Admitting: Internal Medicine

## 2016-09-19 NOTE — Telephone Encounter (Signed)
APT. REMINDER CALL, LMTCB °

## 2016-09-20 ENCOUNTER — Ambulatory Visit (INDEPENDENT_AMBULATORY_CARE_PROVIDER_SITE_OTHER): Payer: Medicare Other | Admitting: Internal Medicine

## 2016-09-20 VITALS — BP 151/82 | HR 52 | Temp 98.7°F | Wt 197.5 lb

## 2016-09-20 DIAGNOSIS — I10 Essential (primary) hypertension: Secondary | ICD-10-CM | POA: Diagnosis not present

## 2016-09-20 DIAGNOSIS — Z87891 Personal history of nicotine dependence: Secondary | ICD-10-CM | POA: Diagnosis not present

## 2016-09-20 DIAGNOSIS — Z0000 Encounter for general adult medical examination without abnormal findings: Secondary | ICD-10-CM

## 2016-09-20 NOTE — Assessment & Plan Note (Signed)
Given flu shot  today 

## 2016-09-20 NOTE — Assessment & Plan Note (Signed)
BP Readings from Last 3 Encounters:  09/20/16 (!) 151/82  08/17/16 (!) 158/82  06/02/16 140/73    Lab Results  Component Value Date   NA 138 08/17/2016   K 3.9 08/17/2016   CREATININE 0.9 08/17/2016    Assessment: Blood pressure control:  controlled Progress toward BP goal:   near goal Comments: repeat manual BP check was 150/88. Not on any meds, previously on HCTZ but d/c due to electrolyte abnl.   Plan: Medications:  Will not start any meds at this time.  Other plans: f/u in 6 months, can consider starting norvasc if BP elevated.

## 2016-09-20 NOTE — Progress Notes (Signed)
   CC: Hypertension  HPI:  [name] is a 75 y.o. with past medical history as outlined below who presents to clinic for hypertension follow-up. He has noticed some rhinorrhea over the past few days but denies any fevers, chills, night sweats.  Past Medical History:  Diagnosis Date  . 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 [name] (benign prostatic hyperplasia)    hyposensitive bladder, nodular prostate with increased PSA (10.23 in 03/2006), s/p prostate biopsy 2006 that was normal (Dr Jeffie Pollock) he recommended TURP.  . Cancer ([place])   . Chronic knee pain    left  . Epididymitis    right  . Frozen shoulder    left frozen shoulder  . Gingivitis   . Hyperlipidemia   . Hypertension   . Right shoulder pain    sx in 06/2005  . Shingles   . Sinus bradycardia     Review of Systems:  Denies abdominal pain, chest pain, and falls. His appetite has been good.  Physical Exam:  Vitals:   09/20/16 0831  BP: (!) 151/82  Pulse: (!) 52  Temp: 98.7 F (37.1 C)  TempSrc: Oral  SpO2: 99%  Weight: 197 lb 8 oz (89.6 kg)   Physical Exam  Constitutional: He is oriented to person, place, and time. He appears well-developed and well-nourished. No distress.  HENT:  Head: Normocephalic and atraumatic.  Nose: Nose normal.  Cardiovascular: Normal rate, regular rhythm and normal heart sounds.  Exam reveals no gallop and no friction rub.   No murmur heard. Pulmonary/Chest: Effort normal and breath sounds normal. No respiratory distress. He has no wheezes. He has no rales.  Abdominal: Soft. Bowel sounds are normal. He exhibits no distension. There is no tenderness. There is no rebound.  Neurological: He is alert and oriented to person, place, and time.  Skin: He is not diaphoretic.    Assessment & Plan:   See Encounters Tab for problem based charting.  Patient discussed with  Dr. Dareen Piano

## 2016-09-23 NOTE — Progress Notes (Signed)
Internal Medicine Clinic Attending  Case discussed with Dr. Truong at the time of the visit.  We reviewed the resident's history and exam and pertinent patient test results.  I agree with the assessment, diagnosis, and plan of care documented in the resident's note.  

## 2016-11-13 ENCOUNTER — Other Ambulatory Visit: Payer: Medicare Other

## 2016-11-15 NOTE — Progress Notes (Signed)
[place]  Telephone:([phone] Fax:([phone]) [phone]  Clinic Follow up Note   Patient Care Team: Norman Herrlich, MD as PCP - General Judeth Horn, MD as Consulting Physician (General Surgery) Truitt Merle, MD as Consulting Physician (Hematology) Irine Seal, MD as Attending Physician (Urology) Tania Ade, RN as Registered Nurse (Medical Oncology) 11/16/2016  CHIEF COMPLAINTS:  Follow up stage III colon cancer   Oncology History   Colon adenocarcinoma   Staging form: Colon and Rectum, AJCC 7th Edition     Pathologic stage from 03/12/2015: Stage IIIB (T4a, N1c, cM0) - Signed by Truitt Merle, MD on 04/05/2015        Cancer of sigmoid colon ([place])   02/27/2015 Imaging    CT abdomen and pelvis showed an annular constricting lesion within the sigmoid colon, no significant adenopathy or distant metastasis. CT abdomen and pelvis on 03/11/2015 showed similar findings with obstruction.      03/12/2015 Initial Diagnosis    Sigmoid colon adenocarcinoma      03/12/2015 Tumor Marker    CEA normal 2.5      03/12/2015 Surgery    Sigmoid colon segmental resection by Dr. Excell Seltzer, surgical margins were negative.      03/12/2015 Pathology Results    Well differentiated invasive adenocarcinoma with associated abundant mucin, 14 lymph nodes were negative, a tumor deposit was present, surgical margins were negative. pT4N1c      04/26/2015 - 10/20/2015 Chemotherapy    Capecitabine po 2032m q12h, 2 weeks on, one week off, as adjuvant chemo       12/02/2015 Imaging    CT chest, abdomen and pelvis with contrast showed a moderately enlarged left external iliac lymph node, 1 cm, indeterminate, interval decrease size of previously described subcentimeter perirectal nodule, no other evidence of metastatic disease.       HISTORY OF PRESENTING ILLNESS:  Greg [name] 76y.o. male is here because of stage III colon cancer. He was referred after his recent hospitalization.   He has been  having abdominal discomfort and nausea for the past few months, along with some blood in his stool. No significant weight loss or change of his appetite. He presented to the ED 02/27/15 for abdominal pain with x-ray showing possible obstruction but CT scan negative for obstruction or ileus but it did indicate an annular constricting lesion in the sigmoid. He was seen by GI Dr OPaulita Fujitaper colonoscopy failed due to the poor prep. There is warranted. He knows what He presented to ED again on 03/12/2015. CT abd/pelvis showed annular constricting lesion within the sigmoid colon with markedly dilated colon proximal to this lesion with air-fluid levels, compatible with distal colonic obstructing lesion concerning for malignancy as well as tiny hypodensities scattered throughout the liver, nonspecific but favor small cysts. General surgery was called and performed sigmoid colectomy with colostomy on 03/12/2015 by Dr. WHulen Skains The patient's post-operative course was complicated only by hypokalemia which was repeatedly replenished with IV and oral KCl. He received IV hydration and pain management and his diet was slowly advanced as tolerated. Physical and occupational therapy saw the patient and made recommendations. He was discharged to home with home health PT and ostomy care on 03/19/2015.   He has been recovering well from the surgery. He has no significant pain at the surgical site, no nausea, normal stool output in the colostomy bag. He has a mild to moderate fatigue, able to take care of himself and tolerating daily activities. He lives alone, uses walker  as needed. He feels his energy level has been recovered well. He has a brother who lives for 5 blocks away, and helps him out when he needs.  CURRENT THERAPY: Surveillance  INTERIM HISTORY: Nylen returns for follow up. He is doing well overall. He gained some weight, eats well. No pain or other new complains. He has not had his study done at Dr. Richarda Blade office,  but he has decided to get it done next week. He would like to have his colostomy reversed in the near future.  MEDICAL HISTORY:  Past Medical History:  Diagnosis Date  . 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 [name] (benign prostatic hyperplasia)    hyposensitive bladder, nodular prostate with increased PSA (10.23 in 03/2006), s/p prostate biopsy 2006 that was normal (Dr Jeffie Pollock) he recommended TURP.  . Cancer ([place])   . Chronic knee pain    left  . Epididymitis    right  . Frozen shoulder    left frozen shoulder  . Gingivitis   . Hyperlipidemia   . Hypertension   . Right shoulder pain    sx in 06/2005  . Shingles   . Sinus bradycardia     SURGICAL HISTORY: Past Surgical History:  Procedure Laterality Date  . COLOSTOMY N/A 03/12/2015   Procedure: COLOSTOMY;  Surgeon: Judeth Horn, MD;  Location: [place];  Service: General;  Laterality: N/A;  . COLOSTOMY REVISION N/A 03/12/2015   Procedure:  RESECTION SIGMOID COLON;  Surgeon: Judeth Horn, MD;  Location: [place];  Service: General;  Laterality: N/A;  . hydrocelectomy  11/14/2010   left hydrocelectomy  . KNEE SURGERY     L knee  . LAPAROTOMY N/A 03/12/2015   Procedure: EXPLORATORY LAPAROTOMY;  Surgeon: Judeth Horn, MD;  Location: [place];  Service: General;  Laterality: N/A;  . right shoulder arthroscopic surgery  06/2005  . TRANSURETHRAL RESECTION OF PROSTATE     status post TURP 2/2 [name] in 2008  . VASECTOMY      SOCIAL HISTORY: Social History   Social History  . Marital status: Single    Spouse name: N/A  . Number of children: N/A  . Years of education: 29   Occupational History  .  Retired    retired   Social History Main Topics  . Smoking status: Former Smoker    Packs/day: 0.50    Years: 25.00    Types: Cigarettes    Quit date: 02/07/1988  . Smokeless tobacco: Never Used  . Alcohol use 0.0 oz/week      Comment: occasional   . Drug use: No  . Sexual activity: Not on file   Other Topics Concern  . Not on file   Social History Narrative   Single, lives alone in apartment   No children   Several local siblings in area-will take him to grocery store, etc   Rides bus (75cents/trip) and bus is right outside his house   Prior employment [employer]   Reports strong faith helps him cope with life   Has had to self-cath twice daily since 2008 -Dr. Jeffie Pollock   [facility] for ostomy care and supplies   Ambulates w/walker-independent in ADL's-cooks pot pies and frozen dinners    FAMILY HISTORY: Family History  Problem Relation Age of Onset  . Cancer Father     unknown type cancer     ALLERGIES:  is allergic to ciprofloxacin hcl and ibuprofen.  MEDICATIONS:  Current Outpatient Prescriptions  Medication Sig Dispense Refill  . cetirizine (ZYRTEC) 10 MG tablet Take 10 mg by mouth at bedtime.    . pravastatin (PRAVACHOL) 20 MG tablet Take 1 tablet (20 mg total) by mouth daily. 90 tablet 4  . sulfamethoxazole-trimethoprim (BACTRIM DS,SEPTRA DS) 800-160 MG tablet Take 1 tablet by mouth at bedtime.      No current facility-administered medications for this visit.     REVIEW OF SYSTEMS:   Constitutional: Denies fevers, chills or abnormal night sweats Eyes: Denies blurriness of vision, double vision or watery eyes Ears, nose, mouth, throat, and face: Denies mucositis or sore throat Respiratory: Denies cough, dyspnea or wheezes Cardiovascular: Denies palpitation, chest discomfort or lower extremity swelling Gastrointestinal:  Denies nausea, heartburn or change in bowel habits Skin: see HPI  Lymphatics: Denies new lymphadenopathy or easy bruising Neurological:Denies numbness, tingling or new weaknesses Behavioral/Psych: Mood is stable, no new changes  All other systems were reviewed with the patient and are negative.  PHYSICAL EXAMINATION: ECOG PERFORMANCE STATUS: 1 - Symptomatic  but completely ambulatory  Vitals:   11/16/16 1017  BP: (!) 155/94  Pulse: (!) 55  Resp: 18  Temp: 97.7 F (36.5 C)   Filed Weights   11/16/16 1017  Weight: 200 lb 14.4 oz (91.1 kg)    GENERAL:alert, no distress and comfortable SKIN: skin color, texture, turgor are normal, significant skin pigmentation, thickening, and mild skin peeling on the palms and feet EYES: normal, conjunctiva are pink and non-injected, sclera clear OROPHARYNX:no exudate, no erythema and lips, buccal mucosa, and tongue normal  NECK: supple, thyroid normal size, non-tender, without nodularity LYMPH:  no palpable lymphadenopathy in the cervical, axillary or inguinal LUNGS: clear to auscultation and percussion with normal breathing effort HEART: regular rate & rhythm and no murmurs and no lower extremity edema ABDOMEN:abdomen soft, non-tender and normal bowel sounds, surgical incision is well healed, (+) colostomy bag in the left abdomen. Musculoskeletal:no cyanosis of digits and no clubbing  PSYCH: alert & oriented x 3 with fluent speech NEURO: no focal motor/sensory deficits  LABORATORY DATA:  I have reviewed the data as listed  CBC Latest Ref Rng & Units 11/16/2016 08/17/2016 05/11/2016  WBC 4.0 - 10.3 10e3/uL 5.6 5.4 5.4  Hemoglobin 13.0 - 17.1 g/dL 14.6 14.7 14.6  Hematocrit 38.4 - 49.9 % 45.1 44.7 44.7  Platelets 140 - 400 10e3/uL 257 223 248    CMP Latest Ref Rng & Units 08/17/2016 05/11/2016 02/16/2016  Glucose 70 - 140 mg/dl 98 87 93  BUN 7.0 - 26.0 mg/dL 17.6 17.0 23.8  Creatinine 0.7 - 1.3 mg/dL 0.9 1.0 1.2  Sodium 136 - 145 mEq/L 138 139 139  Potassium 3.5 - 5.1 mEq/L 3.9 4.5 4.1  Chloride 101 - 111 mmol/L - - -  CO2 22 - 29 mEq/L 24 27 26   Calcium 8.4 - 10.4 mg/dL 8.6 9.0 9.0  Total Protein 6.4 - 8.3 g/dL 7.8 7.6 7.4  Total Bilirubin 0.20 - 1.20 mg/dL 0.44 0.51 0.34  Alkaline Phos 40 - 150 U/L 95 82 77  AST 5 - 34 U/L 18 19 17   ALT 0 - 55 U/L 11 12 12    CEA  Results for SUNDIATA, FERRICK (MRN [id number]) as of 08/17/2016 11:09  Ref. Range 02/16/2016 08:58 05/11/2016 09:41 08/17/2016 09:28  CEA Latest Ref Range: 0.0 - 4.7 ng/mL 2.3 2.6   CEA ([facility]) Latest Ref Range: 0.00 - 5.00 ng/mL  1.46    PATHOLOGY REPORT  Diagnosis 03/12/2015 Colon, segmental resection for tumor, sigmoid colon - INVASIVE WELL-DIFFERENTIATED ADENOCARCINOMA WITH ASSOCIATED ABUNDANT MUCIN, SPANNING 7 CM IN GREATEST DIMENSION. 1 of 4 - TUMOR INVADES THROUGH MUSCULARIS PROPRIA THROUGH PERICOLORECTAL SOFT TISSUES TO INVOLVE SEROSAL SURFACE. - MARGINS ARE NEGATIVE. - FOURTEEN BENIGN LYMPH NODES WITH NO TUMOR SEEN (0/14). - SINGLE SOFT TISSUE SATELLITE  Microscopic Comment COLON AND RECTUM: Specimen: Sigmoid colon. Procedure: Resection of sigmoid colon. Tumor site: Sigmoid colon, distal aspect of specimen. Specimen integrity: Intact.  Macroscopic tumor perforation: Not identified. Invasive tumor: Maximum size: 7 cm. Histologic type(s): Invasive adenocarcinoma with abundant extracellular mucin. Histologic grade and differentiation: G1: well differentiated/low grade Type of polyp in which invasive carcinoma arose: Precursor polyp is not identified. Microscopic extension of invasive tumor: Tumor invades through muscularis propria through pericolorectal soft tissues to involve serosal surface. Lymph-Vascular invasion: Definitive lymph/vascular invasion is not identified; however, a tumor deposit is present, see below. Peri-neural invasion: Not identified. Tumor deposit(s) (discontinuous extramural extension): Yes, tumor deposit present. Resection margins: Proximal margin: Negative. Distal margin: Negative. Mesenteric margin (sigmoid and transverse): Negative. Distance closest margin (if all above margins negative): 3.5 cm (mesenteric margin). Treatment effect (neo-adjuvant therapy): Not applicable. Additional polyp(s): No additional polyps identified. Non-neoplastic findings: No  significant non-neoplastic findings. Lymph nodes: number examined 14; number positive: 0. Pathologic Staging: pT4a, pN1c. Ancillary studies: Per colorectal cancer protocol, MMR by IHC and MSI by PCR will be performed on the current tumor. (RH:ecj 03/16/2015)  ADDITIONAL INFORMATION: Mismatch Repair (MMR) Protein Immunohistochemistry (IHC) IHC Expression Result: MLH1: Preserved nuclear expression (greater 50% tumor expression) MSH2: Preserved nuclear expression (greater 50% tumor expression) MSH6: Preserved nuclear expression (greater 50% tumor expression) PMS2: Preserved nuclear expression (greater 50% tumor expression) * Internal control demonstrates intact nuclear expression Interpretation: NORMAL   RADIOGRAPHIC STUDIES: I have personally reviewed the radiological images as listed and agreed with the findings in the report.  Ct Abdomen Pelvis W Contrast 05/11/2016 IMPRESSION: 1. Slight interval decrease in left pelvic sidewall lymph nodes seen previously. Continued attention on follow-up recommended. 2. Mildly increased conspicuity of the tiny low-density hepatic dome lesion. Attention on follow-up recommended. 3. Otherwise no findings to suggest definite metastatic disease in the abdomen or pelvis. 4. Bladder distention, stable.  ASSESSMENT & PLAN: 76 y.o.  African-American male  1. Sigmoid colon cancer, pT4N1cM0, stage IIIB, grade 1, MMR normal -I reviewed his initial CT abdomen and pelvis findings, and his surgical pathology results in great details with the patient. -Giving the advanced stage IIIB disease, he does have moderate to high risk of cancer recurrence after surgery. -He has completed adjuvant Xeloda, tolerated very well.  -I reviewed his restaging CT abdomen and pelvis from 05/11/2016, no definitive evidence of cancer recurrence. Left external iliac lymph node has decreased in size, the tiny lesion in the liver performed has been overall stable over a year, likely  benign. -Is currently doing well, exam was unremarkable, lab CBC, CMP and CEA are within normal limits. No clinical concern for recurrence. -I'll continue surveillance.  -I encourage him to follow up with Dr. Hulen Skains for colonoscopy and colostomy revision. He does want to have the surgery, but needs a ride for the appointment, his brother's car will be ready next week and he will make an appointment with Dr. Hulen Skains  -I plan to see him every 4 months for a few more times, and then every 6 months afterwards. If Dr. Hulen Skains does not order CT scan before his  surgery, I'll probably obtain a surveillance CT abdomen and pelvis in the summer of 2018.   2.  Atony of bladder -He does straight cath twice a day, and on chronic Bactrim to prevent UTI. -He'll continue follow-up with his urologist.  3. Hypertension, arthritis -He will continue follow-up with his primary care physician -His blood pressure is not well controlled, I strongly encouraged him to follow-up with his primary care physician.    Plan -he will follow up with his surgeon Dr. Hulen Skains for colonoscopy and colostomy revision when he has a ride next week  -Return to clinic in 4 months with lab    All questions were answered. The patient knows to call the clinic with any problems, questions or concerns. I spent 20 minutes counseling the patient face to face. The total time spent in the appointment was 25 minutes and more than 50% was on counseling.     Truitt Merle, MD 11/16/2016

## 2016-11-16 ENCOUNTER — Encounter: Payer: Self-pay | Admitting: Hematology

## 2016-11-16 ENCOUNTER — Ambulatory Visit (HOSPITAL_BASED_OUTPATIENT_CLINIC_OR_DEPARTMENT_OTHER): Payer: Medicare Other

## 2016-11-16 ENCOUNTER — Ambulatory Visit (HOSPITAL_BASED_OUTPATIENT_CLINIC_OR_DEPARTMENT_OTHER): Payer: Medicare Other | Admitting: Hematology

## 2016-11-16 ENCOUNTER — Telehealth: Payer: Self-pay | Admitting: Hematology

## 2016-11-16 VITALS — BP 155/94 | HR 55 | Temp 97.7°F | Resp 18 | Ht 71.0 in | Wt 200.9 lb

## 2016-11-16 DIAGNOSIS — I10 Essential (primary) hypertension: Secondary | ICD-10-CM

## 2016-11-16 DIAGNOSIS — C187 Malignant neoplasm of sigmoid colon: Secondary | ICD-10-CM | POA: Diagnosis not present

## 2016-11-16 DIAGNOSIS — N3289 Other specified disorders of bladder: Secondary | ICD-10-CM | POA: Diagnosis not present

## 2016-11-16 DIAGNOSIS — C189 Malignant neoplasm of colon, unspecified: Secondary | ICD-10-CM

## 2016-11-16 LAB — CBC WITH DIFFERENTIAL/PLATELET
BASO%: 0.5 % (ref 0.0–2.0)
Basophils Absolute: 0 10*3/uL (ref 0.0–0.1)
EOS%: 0.9 % (ref 0.0–7.0)
Eosinophils Absolute: 0.1 10*3/uL (ref 0.0–0.5)
HEMATOCRIT: 45.1 % (ref 38.4–49.9)
HEMOGLOBIN: 14.6 g/dL (ref 13.0–17.1)
LYMPH#: 1.5 10*3/uL (ref 0.9–3.3)
LYMPH%: 26.7 % (ref 14.0–49.0)
MCH: 30.9 pg (ref 27.2–33.4)
MCHC: 32.4 g/dL (ref 32.0–36.0)
MCV: 95.3 fL (ref 79.3–98.0)
MONO#: 0.5 10*3/uL (ref 0.1–0.9)
MONO%: 8.6 % (ref 0.0–14.0)
NEUT#: 3.5 10*3/uL (ref 1.5–6.5)
NEUT%: 63.3 % (ref 39.0–75.0)
Platelets: 257 10*3/uL (ref 140–400)
RBC: 4.73 10*6/uL (ref 4.20–5.82)
RDW: 13.5 % (ref 11.0–14.6)
WBC: 5.6 10*3/uL (ref 4.0–10.3)

## 2016-11-16 LAB — COMPREHENSIVE METABOLIC PANEL
ALBUMIN: 3.8 g/dL (ref 3.5–5.0)
ALT: 9 U/L (ref 0–55)
AST: 18 U/L (ref 5–34)
Alkaline Phosphatase: 98 U/L (ref 40–150)
Anion Gap: 7 mEq/L (ref 3–11)
BUN: 16.4 mg/dL (ref 7.0–26.0)
CALCIUM: 9.3 mg/dL (ref 8.4–10.4)
CHLORIDE: 104 meq/L (ref 98–109)
CO2: 28 mEq/L (ref 22–29)
CREATININE: 1.1 mg/dL (ref 0.7–1.3)
EGFR: 72 mL/min/{1.73_m2} — ABNORMAL LOW (ref >90)
GLUCOSE: 108 mg/dL (ref 70–140)
Potassium: 4.8 mEq/L (ref 3.5–5.1)
SODIUM: 138 meq/L (ref 136–145)
Total Bilirubin: 0.6 mg/dL (ref 0.20–1.20)
Total Protein: 7.8 g/dL (ref 6.4–8.3)

## 2016-11-16 NOTE — Telephone Encounter (Signed)
Appointments scheduled per 12/6 LOS. Patient given AVS report and calendars with future scheduled appointments. °

## 2016-12-27 ENCOUNTER — Encounter (HOSPITAL_COMMUNITY): Payer: Self-pay

## 2016-12-27 ENCOUNTER — Ambulatory Visit (HOSPITAL_COMMUNITY)
Admission: RE | Admit: 2016-12-27 | Discharge: 2016-12-27 | Disposition: A | Discharge status: Home / Self Care | Payer: Medicare Other | Source: Ambulatory Visit | Attending: Hematology | Admitting: Hematology

## 2016-12-27 DIAGNOSIS — C187 Malignant neoplasm of sigmoid colon: Secondary | ICD-10-CM | POA: Insufficient documentation

## 2016-12-27 DIAGNOSIS — K435 Parastomal hernia without obstruction or  gangrene: Secondary | ICD-10-CM | POA: Diagnosis not present

## 2016-12-27 DIAGNOSIS — K769 Liver disease, unspecified: Secondary | ICD-10-CM | POA: Diagnosis not present

## 2016-12-27 DIAGNOSIS — M160 Bilateral primary osteoarthritis of hip: Secondary | ICD-10-CM | POA: Diagnosis not present

## 2016-12-27 MED ORDER — IOPAMIDOL (ISOVUE-300) INJECTION 61%
INTRAVENOUS | Status: AC
  Filled 2016-12-27: qty 100

## 2016-12-27 MED ORDER — IOPAMIDOL (ISOVUE-300) INJECTION 61%
100.0000 mL | Freq: Once | INTRAVENOUS | Status: AC | PRN
  Administered 2016-12-27: 100 mL via INTRAVENOUS

## 2017-01-25 DIAGNOSIS — N3020 Other chronic cystitis without hematuria: Secondary | ICD-10-CM | POA: Diagnosis not present

## 2017-01-25 DIAGNOSIS — R972 Elevated prostate specific antigen [PSA]: Secondary | ICD-10-CM | POA: Diagnosis not present

## 2017-02-28 ENCOUNTER — Telehealth: Payer: Self-pay | Admitting: Hematology

## 2017-02-28 NOTE — Telephone Encounter (Signed)
Pt called to r/s 5/11 appt to 6/1 at 1230

## 2017-03-01 ENCOUNTER — Other Ambulatory Visit: Payer: Medicare Other

## 2017-03-01 ENCOUNTER — Ambulatory Visit: Payer: Medicare Other | Admitting: Hematology

## 2017-03-19 NOTE — Progress Notes (Signed)
[place]  Telephone:([phone]) [phone] Fax:([phone]) [phone]  Clinic Follow up Note   Patient Care Team: Greg Herrlich, MD as PCP - Greg Potter, MD as Consulting Physician (General Surgery) Greg Merle, MD as Consulting Physician (Hematology) Greg Seal, MD as Attending Physician (Urology) Greg Ade, RN as Registered Nurse (Medical Oncology) 03/22/2017  CHIEF COMPLAINTS:  Follow up stage III colon cancer   Oncology History   Colon adenocarcinoma   Staging form: Colon and Rectum, AJCC 7th Edition     Pathologic stage from 03/12/2015: Stage IIIB (T4a, N1c, cM0) - Signed by Greg Merle, MD on 04/05/2015        Cancer of sigmoid colon ([place])   02/27/2015 Imaging    CT abdomen and pelvis showed an annular constricting lesion within the sigmoid colon, no significant adenopathy or distant metastasis. CT abdomen and pelvis on 03/11/2015 showed similar findings with obstruction.      03/12/2015 Initial Diagnosis    Sigmoid colon adenocarcinoma      03/12/2015 Tumor Marker    CEA normal 2.5      03/12/2015 Surgery    Sigmoid colon segmental resection by Dr. Excell [name], surgical margins were negative.      03/12/2015 Pathology Results    Well differentiated invasive adenocarcinoma with associated abundant mucin, 14 lymph nodes were negative, a tumor deposit was present, surgical margins were negative. pT4N1c      04/26/2015 - 10/20/2015 Chemotherapy    Capecitabine po 2068m q12h, 2 weeks on, one week off, as adjuvant chemo       12/02/2015 Imaging    CT chest, abdomen and pelvis with contrast showed a moderately enlarged left external iliac lymph node, 1 cm, indeterminate, interval decrease size of previously described subcentimeter perirectal nodule, no other evidence of metastatic disease.      12/27/2016 Imaging    CT CAP w contrast  IMPRESSION: 1. Similar size of a left external iliac node, which is not pathologic by size criteria. Otherwise, no evidence of  metastatic disease in the chest, abdomen, or pelvis. 2. Tiny liver lesions are felt to be similar over prior exams but warrant followup attention. 3. 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 4. Esophageal air fluid level suggests dysmotility or gastroesophageal reflux.       HISTORY OF PRESENTING ILLNESS:  Greg [name] 76y.o. male is here because of stage III colon cancer. He was referred after his recent hospitalization.   He has been having abdominal discomfort and nausea for the past few months, along with some blood in his stool. No significant weight loss or change of his appetite. He presented to the ED 02/27/15 for abdominal pain with x-ray showing possible obstruction but CT scan negative for obstruction or ileus but it did indicate an annular constricting lesion in the sigmoid. He was seen by GI Dr Greg Fujitaper colonoscopy failed due to the poor prep. There is warranted. He knows what He presented to ED again on 03/12/2015. CT abd/pelvis showed annular constricting lesion within the sigmoid colon with markedly dilated colon proximal to this lesion with air-fluid levels, compatible with distal colonic obstructing lesion concerning for malignancy as well as tiny hypodensities scattered throughout the liver, nonspecific but favor small cysts. General surgery was called and performed sigmoid colectomy with colostomy on 03/12/2015 by Dr. WHulen [name] The patient's post-operative course was complicated only by hypokalemia which was repeatedly replenished with IV and oral KCl. He received IV hydration and  pain management and his diet was slowly advanced as tolerated. Physical and occupational therapy saw the patient and made recommendations. He was discharged to home with home health PT and ostomy care on 03/19/2015.   He has been recovering well from the surgery. He has no significant pain at the surgical site, no nausea, normal stool  output in the colostomy bag. He has a mild to moderate fatigue, able to take care of himself and tolerating daily activities. He lives alone, uses walker as needed. He feels his energy level has been recovered well. He has a brother who lives for 5 blocks away, and helps him out when he needs.  CURRENT THERAPY: Surveillance  INTERIM HISTORY: Greg [name] returns for follow up. He feels fine and denies any pain or nausea. He has gained some weight lately. His appetite and energy level are good.Marland Kitchen    MEDICAL HISTORY:  Past Medical History:  Diagnosis Date  . 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 [name] (benign prostatic hyperplasia)    hyposensitive bladder, nodular prostate with increased PSA (10.23 in 03/2006), s/p prostate biopsy 2006 that was normal (Dr Greg [name]) he recommended TURP.  . Cancer ([place])   . Chronic knee pain    left  . Epididymitis    right  . Frozen shoulder    left frozen shoulder  . Gingivitis   . Hyperlipidemia   . Hypertension   . Right shoulder pain    sx in 06/2005  . Shingles   . Sinus bradycardia     SURGICAL HISTORY: Past Surgical History:  Procedure Laterality Date  . COLOSTOMY N/A 03/12/2015   Procedure: COLOSTOMY;  Surgeon: Greg Horn, MD;  Location: [place];  Service: General;  Laterality: N/A;  . COLOSTOMY REVISION N/A 03/12/2015   Procedure:  RESECTION SIGMOID COLON;  Surgeon: Greg Horn, MD;  Location: [place];  Service: General;  Laterality: N/A;  . hydrocelectomy  11/14/2010   left hydrocelectomy  . KNEE SURGERY     L knee  . LAPAROTOMY N/A 03/12/2015   Procedure: EXPLORATORY LAPAROTOMY;  Surgeon: Greg Horn, MD;  Location: [place];  Service: General;  Laterality: N/A;  . right shoulder arthroscopic surgery  06/2005  . TRANSURETHRAL RESECTION OF PROSTATE     status post TURP 2/2 [name] in 2008  . VASECTOMY      SOCIAL HISTORY: Social History    Social History  . Marital status: Single    Spouse name: N/A  . Number of children: N/A  . Years of education: 31   Occupational History  .  Retired    retired   Social History Main Topics  . Smoking status: Former Smoker    Packs/day: 0.50    Years: 25.00    Types: Cigarettes    Quit date: 02/07/1988  . Smokeless tobacco: Never Used  . Alcohol use 0.0 oz/week     Comment: occasional   . Drug use: No  . Sexual activity: Not on file   Other Topics Concern  . Not on file   Social History Narrative   Single, lives alone in apartment   No children   Several local siblings in area-will take him to grocery store, etc   Rides bus (75cents/trip) and bus is right outside his house   Prior employment [employer]   Reports strong faith helps him cope with life   Has had to  self-cath twice daily since 2008 -Dr. Jeffie [name]   [facility] for ostomy care and supplies   Ambulates w/walker-independent in ADL's-cooks pot pies and frozen dinners    FAMILY HISTORY: Family History  Problem Relation Age of Onset  . Cancer Father        unknown type cancer     ALLERGIES:  is allergic to ciprofloxacin hcl and ibuprofen.  MEDICATIONS:  Current Outpatient Prescriptions  Medication Sig Dispense Refill  . cetirizine (ZYRTEC) 10 MG tablet Take 10 mg by mouth at bedtime.    . pravastatin (PRAVACHOL) 20 MG tablet Take 1 tablet (20 mg total) by mouth daily. 90 tablet 4  . sulfamethoxazole-trimethoprim (BACTRIM DS,SEPTRA DS) 800-160 MG tablet Take 1 tablet by mouth at bedtime.      No current facility-administered medications for this visit.     REVIEW OF SYSTEMS:   Constitutional: Denies fevers, chills or abnormal night sweats (+)weight gain Eyes: Denies blurriness of vision, double vision or watery eyes Ears, nose, mouth, throat, and face: Denies mucositis or sore throat Respiratory: Denies cough, dyspnea or wheezes Cardiovascular: Denies palpitation, chest discomfort or lower  extremity swelling Gastrointestinal:  Denies nausea, heartburn or change in bowel habits Skin: see HPI  Lymphatics: Denies new lymphadenopathy or easy bruising Neurological:Denies numbness, tingling or new weaknesses Behavioral/Psych: Mood is stable, no new changes  All other systems were reviewed with the patient and are negative.   PHYSICAL EXAMINATION: ECOG PERFORMANCE STATUS: 1 - Symptomatic but completely ambulatory  Vitals:   03/22/17 1255  BP: (!) 165/77  Pulse: (!) 54  Resp: 20  Temp: 97.8 F (36.6 C)   Filed Weights   03/22/17 1255  Weight: 210 lb 12.8 oz (95.6 kg)    GENERAL:alert, no distress and comfortable SKIN: skin color, texture, turgor are normal, significant skin pigmentation, thickening, and mild skin peeling on the palms and feet EYES: normal, conjunctiva are pink and non-injected, sclera clear OROPHARYNX:no exudate, no erythema and lips, buccal mucosa, and tongue normal  NECK: supple, thyroid normal size, non-tender, without nodularity LYMPH:  no palpable lymphadenopathy in the cervical, axillary or inguinal LUNGS: clear to auscultation and percussion with normal breathing effort HEART: regular rate & rhythm and no murmurs and no lower extremity edema ABDOMEN:abdomen soft, non-tender and normal bowel sounds, surgical incision is well healed, (+) colostomy bag in the left abdomen. Musculoskeletal:no cyanosis of digits and no clubbing  PSYCH: alert & oriented x 3 with fluent speech NEURO: no focal motor/sensory deficits  LABORATORY DATA:  I have reviewed the data as listed  CBC Latest Ref Rng & Units 03/22/2017 11/16/2016 08/17/2016  WBC 4.0 - 10.3 10e3/uL 6.2 5.6 5.4  Hemoglobin 13.0 - 17.1 g/dL 14.5 14.6 14.7  Hematocrit 38.4 - 49.9 % 44.4 45.1 44.7  Platelets 140 - 400 10e3/uL 215 257 223    CMP Latest Ref Rng & Units 03/22/2017 11/16/2016 08/17/2016  Glucose 70 - 140 mg/dl 86 108 98  BUN 7.0 - 26.0 mg/dL 13.0 16.4 17.6  Creatinine 0.7 - 1.3 mg/dL 1.0  1.1 0.9  Sodium 136 - 145 mEq/L 141 138 138  Potassium 3.5 - 5.1 mEq/L 3.9 4.8 3.9  Chloride 101 - 111 mmol/L - - -  CO2 22 - 29 mEq/L 26 28 24   Calcium 8.4 - 10.4 mg/dL 8.7 9.3 8.6  Total Protein 6.4 - 8.3 g/dL 7.2 7.8 7.8  Total Bilirubin 0.20 - 1.20 mg/dL 0.56 0.60 0.44  Alkaline Phos 40 - 150 U/L 86 98 95  AST 5 - 34 U/L 19 18 18   ALT 0 - 55 U/L 10 9 11    Results for MYREON, WIMER (MRN [id number]) as of 03/23/2017 12:21  Ref. Range 05/11/2016 09:41 08/17/2016 09:28 03/22/2017 12:42  CEA Latest Ref Range: 0.0 - 4.7 ng/mL 2.6 2.4 2.5  CEA ([facility]) Latest Ref Range: 0.00 - 5.00 ng/mL  1.46 1.37   PATHOLOGY REPORT  Diagnosis 03/12/2015 Colon, segmental resection for tumor, sigmoid colon - INVASIVE WELL-DIFFERENTIATED ADENOCARCINOMA WITH ASSOCIATED ABUNDANT MUCIN, SPANNING 7 CM IN GREATEST DIMENSION. 1 of 4 - TUMOR INVADES THROUGH MUSCULARIS PROPRIA THROUGH PERICOLORECTAL SOFT TISSUES TO INVOLVE SEROSAL SURFACE. - MARGINS ARE NEGATIVE. - FOURTEEN BENIGN LYMPH NODES WITH NO TUMOR SEEN (0/14). - SINGLE SOFT TISSUE SATELLITE  Microscopic Comment COLON AND RECTUM: Specimen: Sigmoid colon. Procedure: Resection of sigmoid colon. Tumor site: Sigmoid colon, distal aspect of specimen. Specimen integrity: Intact.  Macroscopic tumor perforation: Not identified. Invasive tumor: Maximum size: 7 cm. Histologic type(s): Invasive adenocarcinoma with abundant extracellular mucin. Histologic grade and differentiation: G1: well differentiated/low grade Type of polyp in which invasive carcinoma arose: Precursor polyp is not identified. Microscopic extension of invasive tumor: Tumor invades through muscularis propria through pericolorectal soft tissues to involve serosal surface. Lymph-Vascular invasion: Definitive lymph/vascular invasion is not identified; however, a tumor deposit is present, see below. Peri-neural invasion: Not identified. Tumor deposit(s) (discontinuous extramural  extension): Yes, tumor deposit present. Resection margins: Proximal margin: Negative. Distal margin: Negative. Mesenteric margin (sigmoid and transverse): Negative. Distance closest margin (if all above margins negative): 3.5 cm (mesenteric margin). Treatment effect (neo-adjuvant therapy): Not applicable. Additional polyp(s): No additional polyps identified. Non-neoplastic findings: No significant non-neoplastic findings. Lymph nodes: number examined 14; number positive: 0. Pathologic Staging: pT4a, pN1c. Ancillary studies: Per colorectal cancer protocol, MMR by IHC and MSI by PCR will be performed on the current tumor. (RH:ecj 03/16/2015)  ADDITIONAL INFORMATION: Mismatch Repair (MMR) Protein Immunohistochemistry (IHC) IHC Expression Result: MLH1: Preserved nuclear expression (greater 50% tumor expression) MSH2: Preserved nuclear expression (greater 50% tumor expression) MSH6: Preserved nuclear expression (greater 50% tumor expression) PMS2: Preserved nuclear expression (greater 50% tumor expression) * Internal control demonstrates intact nuclear expression Interpretation: NORMAL   RADIOGRAPHIC STUDIES: I have personally reviewed the radiological images as listed and agreed with the findings in the report.  CT CAP w contrast 12/27/2016 IMPRESSION: 1. Similar size of a left external iliac node, which is not pathologic by size criteria. Otherwise, no evidence of metastatic disease in the chest, abdomen, or pelvis. 2. Tiny liver lesions are felt to be similar over prior exams but warrant followup attention. 3. 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 4. Esophageal air fluid level suggests dysmotility or gastroesophageal reflux.  Ct Abdomen Pelvis W Contrast 05/11/2016 IMPRESSION: 1. Slight interval decrease in left pelvic sidewall lymph nodes seen previously. Continued attention on follow-up recommended. 2.  Mildly increased conspicuity of the tiny low-density hepatic dome lesion. Attention on follow-up recommended. 3. Otherwise no findings to suggest definite metastatic disease in the abdomen or pelvis. 4. Bladder distention, stable.  ASSESSMENT & PLAN: 76 y.o.  African-American male  1. Sigmoid colon cancer, pT4N1cM0, stage IIIB, grade 1, MMR normal -I previously reviewed his initial CT abdomen and pelvis findings, and his surgical pathology results in great details with the patient. -Giving the advanced stage IIIB disease, he does have moderate to high risk of cancer recurrence after surgery. -He has completed adjuvant Xeloda, tolerated very well.  -I previously reviewed  his restaging CT abdomen and pelvis from 05/11/2016, no definitive evidence of cancer recurrence. Left external iliac lymph node has decreased in size, the tiny lesion in the liver performed has been overall stable over a year, likely benign. -I reviewed his restaging CT chest, abdomen and pelvis from 12/27/2016, which showed no evidence of recurrence, stable tiny liver lesion and left iliac lymph node, likely benign -he is currently doing well, exam was unremarkable, lab CBC, CMP and CEA are within normal limits. No clinical concern for recurrence. -I'll continue surveillance.  -I previously encouraged him to follow up with Dr. Hulen [name] for colonoscopy and colostomy revision. He does want to have the surgery, but needs a ride for the appointment, he is waiting his brother's car to be ready - Repeat scan in 1 year in March 2019 -Continue surveillance, I'll see him back in 6 months    2.  Atony of bladder -He does straight cath twice a day, and on chronic Bactrim to prevent UTI. -He'll continue follow-up with his urologist.  3. Hypertension, arthritis -He will continue follow-up with his primary care physician -His blood pressure is not well controlled, I previously strongly encouraged him to follow-up with his primary care  physician.    Plan - Return to clinic in 6 months with labs -  repeat scan next March   All questions were answered. The patient knows to call the clinic with any problems, questions or concerns. I spent 20 minutes counseling the patient face to face. The total time spent in the appointment was 25 minutes and more than 50% was on counseling.   This document serves as a record of services personally performed by Greg Merle, MD. It was created on her behalf by Brandt Loosen, a trained medical scribe. The creation of this record is based on the scribe's personal observations and the provider's statements to them. This document has been checked and approved by the attending provider.   Greg Merle, MD 03/22/2017

## 2017-03-22 ENCOUNTER — Telehealth: Payer: Self-pay | Admitting: Hematology

## 2017-03-22 ENCOUNTER — Other Ambulatory Visit (HOSPITAL_BASED_OUTPATIENT_CLINIC_OR_DEPARTMENT_OTHER): Payer: Medicare Other

## 2017-03-22 ENCOUNTER — Ambulatory Visit (HOSPITAL_BASED_OUTPATIENT_CLINIC_OR_DEPARTMENT_OTHER): Payer: Medicare Other | Admitting: Hematology

## 2017-03-22 VITALS — BP 165/77 | HR 54 | Temp 97.8°F | Resp 20 | Ht 71.0 in | Wt 210.8 lb

## 2017-03-22 DIAGNOSIS — C189 Malignant neoplasm of colon, unspecified: Secondary | ICD-10-CM

## 2017-03-22 DIAGNOSIS — I10 Essential (primary) hypertension: Secondary | ICD-10-CM

## 2017-03-22 DIAGNOSIS — C187 Malignant neoplasm of sigmoid colon: Secondary | ICD-10-CM

## 2017-03-22 LAB — CBC WITH DIFFERENTIAL/PLATELET
BASO%: 0.6 % (ref 0.0–2.0)
Basophils Absolute: 0 10*3/uL (ref 0.0–0.1)
EOS%: 1.6 % (ref 0.0–7.0)
Eosinophils Absolute: 0.1 10*3/uL (ref 0.0–0.5)
HCT: 44.4 % (ref 38.4–49.9)
HEMOGLOBIN: 14.5 g/dL (ref 13.0–17.1)
LYMPH%: 27.9 % (ref 14.0–49.0)
MCH: 31.2 pg (ref 27.2–33.4)
MCHC: 32.7 g/dL (ref 32.0–36.0)
MCV: 95.5 fL (ref 79.3–98.0)
MONO#: 0.7 10*3/uL (ref 0.1–0.9)
MONO%: 10.6 % (ref 0.0–14.0)
NEUT%: 59.3 % (ref 39.0–75.0)
NEUTROS ABS: 3.7 10*3/uL (ref 1.5–6.5)
Platelets: 215 10*3/uL (ref 140–400)
RBC: 4.65 10*6/uL (ref 4.20–5.82)
RDW: 13.7 % (ref 11.0–14.6)
WBC: 6.2 10*3/uL (ref 4.0–10.3)
lymph#: 1.7 10*3/uL (ref 0.9–3.3)

## 2017-03-22 LAB — COMPREHENSIVE METABOLIC PANEL WITH GFR
ALT: 10 U/L (ref 0–55)
AST: 19 U/L (ref 5–34)
Albumin: 3.6 g/dL (ref 3.5–5.0)
Alkaline Phosphatase: 86 U/L (ref 40–150)
Anion Gap: 7 meq/L (ref 3–11)
BUN: 13 mg/dL (ref 7.0–26.0)
CO2: 26 meq/L (ref 22–29)
Calcium: 8.7 mg/dL (ref 8.4–10.4)
Chloride: 108 meq/L (ref 98–109)
Creatinine: 1 mg/dL (ref 0.7–1.3)
EGFR: 82 ml/min/1.73 m2 — ABNORMAL LOW
Glucose: 86 mg/dL (ref 70–140)
Potassium: 3.9 meq/L (ref 3.5–5.1)
Sodium: 141 meq/L (ref 136–145)
Total Bilirubin: 0.56 mg/dL (ref 0.20–1.20)
Total Protein: 7.2 g/dL (ref 6.4–8.3)

## 2017-03-22 LAB — CEA (IN HOUSE-CHCC): CEA (CHCC-In House): 1.37 ng/mL (ref 0.00–5.00)

## 2017-03-22 NOTE — Telephone Encounter (Signed)
Scheduled appt per 6/1 LOS - lab and f/u in 6 months.

## 2017-03-23 ENCOUNTER — Encounter: Payer: Self-pay | Admitting: Hematology

## 2017-03-23 LAB — CEA: CEA1: 2.5 ng/mL (ref 0.0–4.7)

## 2017-03-25 ENCOUNTER — Telehealth: Payer: Self-pay | Admitting: *Deleted

## 2017-03-25 NOTE — Telephone Encounter (Signed)
-----   Message from Truitt Merle, MD sent at 03/24/2017  6:26 PM EDT ----- Please let pt know the lab results, thanks.  Truitt Merle  03/24/2017

## 2017-03-25 NOTE — Telephone Encounter (Signed)
Called & left message on identified vm that CEA normal & to call tomorrow to let us know he got result & if he has questions or concerns.

## 2017-03-26 ENCOUNTER — Telehealth: Payer: Self-pay | Admitting: *Deleted

## 2017-03-26 NOTE — Telephone Encounter (Signed)
Called pt and left message on voice mail re: CEA level normal as per Dr. Ernestina Penna instructions

## 2017-03-26 NOTE — Telephone Encounter (Signed)
-----   Message from Truitt Merle, MD sent at 03/24/2017  6:26 PM EDT ----- Please let pt know the lab results, thanks.  Truitt Merle  03/24/2017

## 2017-05-06 ENCOUNTER — Ambulatory Visit (INDEPENDENT_AMBULATORY_CARE_PROVIDER_SITE_OTHER): Payer: Medicare Other | Admitting: Internal Medicine

## 2017-05-06 ENCOUNTER — Encounter: Payer: Self-pay | Admitting: Internal Medicine

## 2017-05-06 VITALS — BP 153/84 | HR 60 | Temp 97.7°F | Ht 71.0 in | Wt 209.4 lb

## 2017-05-06 DIAGNOSIS — I10 Essential (primary) hypertension: Secondary | ICD-10-CM

## 2017-05-06 DIAGNOSIS — Z933 Colostomy status: Secondary | ICD-10-CM | POA: Diagnosis not present

## 2017-05-06 DIAGNOSIS — E785 Hyperlipidemia, unspecified: Secondary | ICD-10-CM

## 2017-05-06 DIAGNOSIS — Z9221 Personal history of antineoplastic chemotherapy: Secondary | ICD-10-CM | POA: Diagnosis not present

## 2017-05-06 DIAGNOSIS — N400 Enlarged prostate without lower urinary tract symptoms: Secondary | ICD-10-CM | POA: Diagnosis not present

## 2017-05-06 DIAGNOSIS — Z87891 Personal history of nicotine dependence: Secondary | ICD-10-CM

## 2017-05-06 DIAGNOSIS — Z85038 Personal history of other malignant neoplasm of large intestine: Secondary | ICD-10-CM | POA: Diagnosis not present

## 2017-05-06 MED ORDER — AMLODIPINE BESYLATE 5 MG PO TABS: 5.0000 mg | ORAL_TABLET | Freq: Every day | ORAL | 1 refills | Status: DC

## 2017-05-06 NOTE — Progress Notes (Signed)
CC: Hypertension follow up  HPI:  [name] is a 76 y.o. with a PMH of stage 3b colon cancer s/p resection and chemotherapy in 2016, [name], and HTN who is presenting for follow up of his HTN. At his last clinic visit in 08/2016, the patient's BP was elevated but medication was not started at that time.   Since his last visit the patient has felt well and has no new complaints today. Since he was seen in this clinic, the patient reports he had a visit with his oncologist who stated that he needed to see his PCP since his blood pressure was high at that visit. He denies headaches, dizziness, and changes in his vision. He states that he remembers taking blood pressure medication in the past but hasn't taken any in a few years at the request of his previous physician.   The patient currently has an ostomy, which he cares for himself. He used to have a wound care nurse who helped take care of his ostomy but he no longer requires this help. He has plans to revise his colostomy with his surgeon and thinks this will happen sometime in the near future.  He states that he has difficulty with urination at baseline. He denies significant pain with urination since his urologist recommend he start taking OTC AZO tablets as needed for pain. He states that he still self-caths twice a day to help empty his bladder completely. He takes bactrim daily for prophylaxis. This is given to him by his urologist.  Past Medical History:  Diagnosis Date  . 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 [name] (benign prostatic hyperplasia)    hyposensitive bladder, nodular prostate with increased PSA (10.23 in 03/2006), s/p prostate biopsy 2006 that was normal (Dr Jeffie Pollock) he recommended TURP.  . Cancer ([place])   . Chronic knee pain    left  . Epididymitis    right  . Frozen shoulder    left frozen shoulder  .  Gingivitis   . Hyperlipidemia   . Hypertension   . Right shoulder pain    sx in 06/2005  . Shingles   . Sinus bradycardia    Review of Systems:   Patient endorses slight chronic knee pain which is not worse than baseline today. Patient denies chest pain, fevers, SOB at rest and with exertion, abdominal pain, change in ostomy output, blood in ostomy bag, hematuria, and recent change in odor/color of urine.   Physical Exam:  Vitals:   05/06/17 1405  BP: (!) 153/84  Pulse: 60  Temp: 97.7 F (36.5 C)  TempSrc: Oral  SpO2: 98%  Weight: 209 lb 6.4 oz (95 kg)  Height: 5\' 11"  (1.803 m)   Physical Exam  Constitutional: He appears well-developed and well-nourished.  Patient walks with cane at baseline and reports no difficulties moving around.   Cardiovascular: Normal rate, regular rhythm, normal heart sounds and intact distal pulses.  Exam reveals no friction rub.   No murmur heard. No pitting edema bilaterally  Pulmonary/Chest: Effort normal and breath sounds normal. No respiratory distress. He has no wheezes.  No crackles appreciated bilaterally.  Abdominal: Soft. He exhibits no distension. There is no tenderness.  Colostomy bag taped in place over stoma in LLQ. Stoma is well healed without significant bulging of intestinal contents or erythema. No erythema surrounding the colostomy site.  Musculoskeletal:  Slight tenderness  to palpation over L knee. No erythema, effusion, or crepitus on L knee. ROM in R and L knee within normal limits.  Skin: Skin is warm and dry. Capillary refill takes less than 2 seconds. No erythema.    Assessment & Plan:   See Encounters Tab for problem based charting.  Patient seen with Dr. Daryll Drown.

## 2017-05-06 NOTE — Patient Instructions (Signed)
Thank you for seeing [place] today!  Your blood pressure was elevated today. You will start taking amlodipine. Please take one 5 mg tablet each day. You can pick up this medicine at the pharmacy later today.   Please monitor daily for possible leg swelling. Please let me know if you have any problems obtaining or taking this medication.  Please return to clinic in 4-6 weeks to see Dr. Berneice Gandy (your PCP). At this visit we will look at your BP and see how you are doing with your new medication.

## 2017-05-06 NOTE — Assessment & Plan Note (Signed)
This patient's last LDL was 67 on 03/02/2014 per chart review. The patient's HLD has been well controlled on pravastatin 20 mg daily. He was given a refill of this medication and a lipid profile was obtained at today's visit.   Plan:  -Continue pravastatin 20 mg daily -Follow up lipid profile

## 2017-05-06 NOTE — Assessment & Plan Note (Signed)
The patient's BP was elevated above goal of 140/90 today. Per chart review, the patient's BP measurements have been elevated on multiple visits. His hypertension is uncontrolled right now and the patient was instructed to start taking amlodipine 5 mg daily. He was counseled that lower extremity swelling can occur with this medication. The patient was instructed to monitor himself for lower extremity swelling and to call if this occurs. Otherwise the patient will follow up in 4-6 weeks to reassess his BP.  Plan: -Start amlodipine 5 mg daily -Follow up in 4-6 weeks for BP reassessment; consider increasing dose if no side effects and BP uncontrolled at that visit.

## 2017-05-08 LAB — LIPID PANEL
CHOLESTEROL TOTAL: 149 mg/dL (ref 100–199)
Chol/HDL Ratio: 2.6 ratio (ref 0.0–5.0)
HDL: 58 mg/dL (ref >39)
LDL CALC: 80 mg/dL (ref 0–99)
Triglycerides: 54 mg/dL (ref 0–149)
VLDL Cholesterol Cal: 11 mg/dL (ref 5–40)

## 2017-05-08 NOTE — Progress Notes (Signed)
Internal Medicine Clinic Attending  I saw and evaluated the patient.  I personally confirmed the key portions of the history and exam documented by Dr. Nedrud and I reviewed pertinent patient test results.  The assessment, diagnosis, and plan were formulated together and I agree with the documentation in the resident's note.  

## 2017-05-17 ENCOUNTER — Telehealth: Payer: Self-pay | Admitting: *Deleted

## 2017-05-17 NOTE — Telephone Encounter (Signed)
Called Mr. Greg [name] this afternoon regarding upper lip swelling. Patient states that he was having tenderness and swelling over part of his upper lip and that he thought a cold sore was coming. He denies swelling of entire upper lip, swelling in bottom lip, swelling of LE, difficulty breathing, rash and headaches. He states that he put some cold sore cream from his medication cabinet on the swollen area and that it seemed to help. I thanked patient for calling and let him know that he could continue amlodipine. He was instructed to call back if swelling worsened or if he develops leg swelling, rash, and difficulty breathing.

## 2017-05-17 NOTE — Telephone Encounter (Addendum)
Call from pt - states he woke up this morning,top lip swollen. States he was told to call if he has any problems with  Amlodipine, new med - took med x 3 days; has not taken it this morning. Informed not to take med until hear from doctor.

## 2017-06-21 IMAGING — CT CT CHEST W/O CM
1 of 2 series · 14 of 30 positions shown, 18 images · non-contrast
Comparison: Abdominal CT 03/11/2015

CLINICAL DATA: New diagnosis of colon cancer. Evaluate for thoracic
metastatic disease. Recent colectomy. Initial encounter.

EXAM:
CT CHEST WITHOUT CONTRAST
TECHNIQUE: Multidetector CT imaging of the chest was performed following the
standard protocol without IV contrast.

[Series 2: rtn chest without st · axial · non-contrast · 0.86mm/px · z∈[+72,+358]mm · 14 of 67 slices shown, 18 images]
[im 5/67  mediastinal]
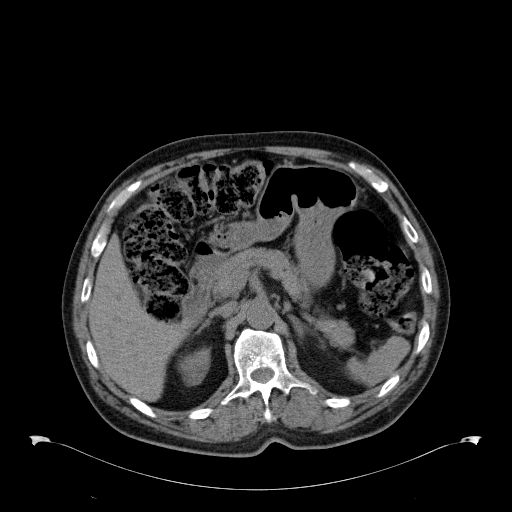
[im 5/67  lung]
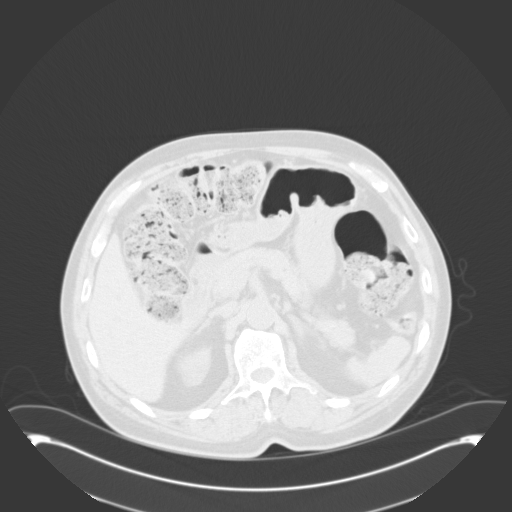
[im 10/67  lung]
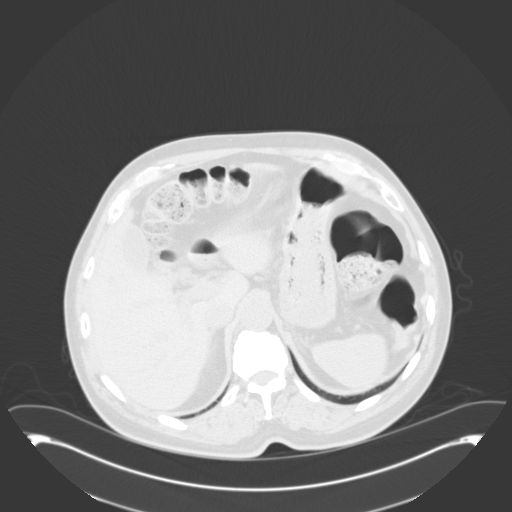
[im 15/67  lung]
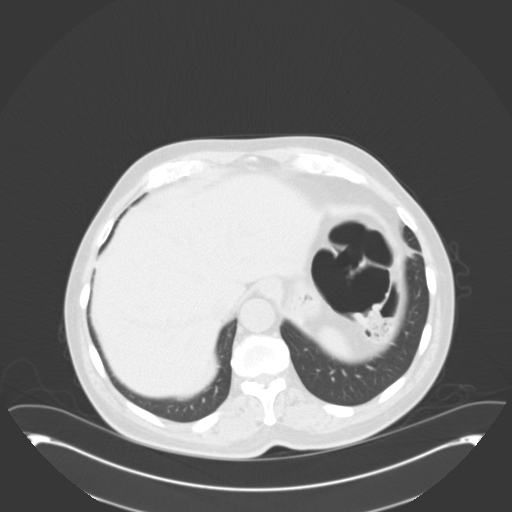
[im 19/67  lung]
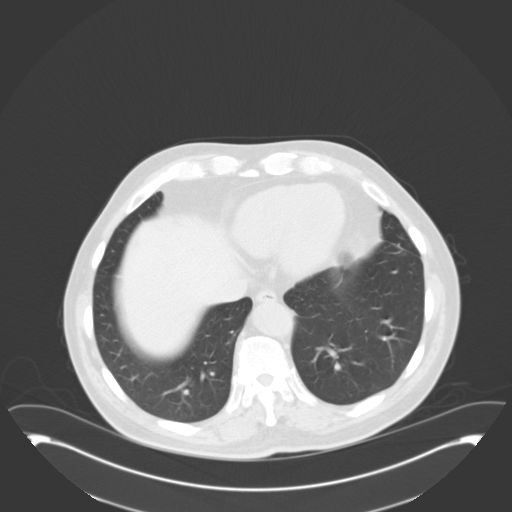
[im 24/67  mediastinal]
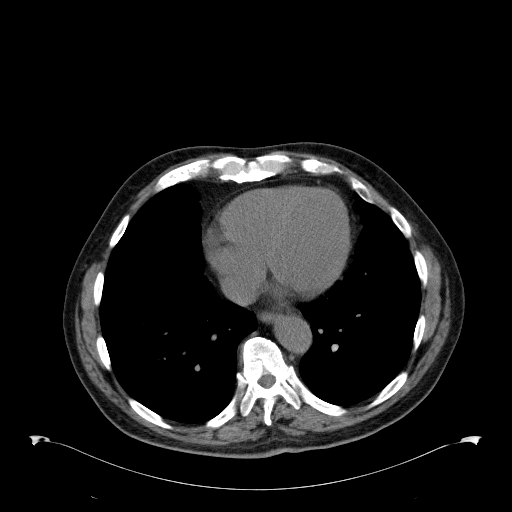
[im 24/67  lung]
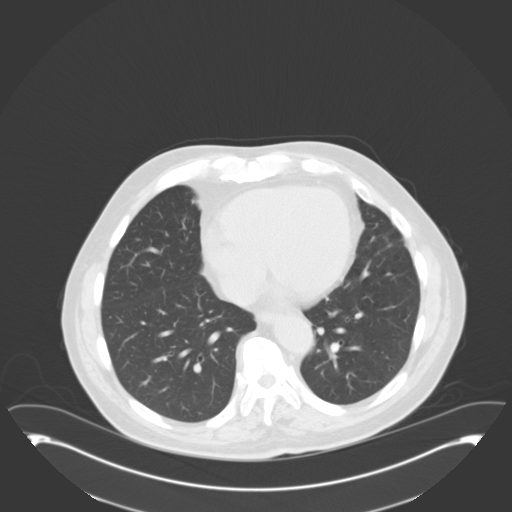
[im 29/67  lung]
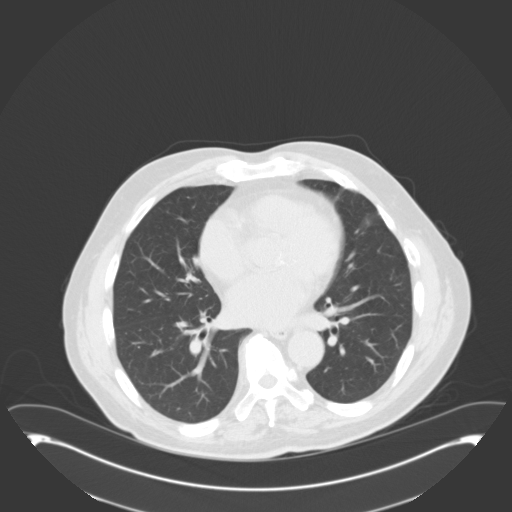
[im 32/67  lung]
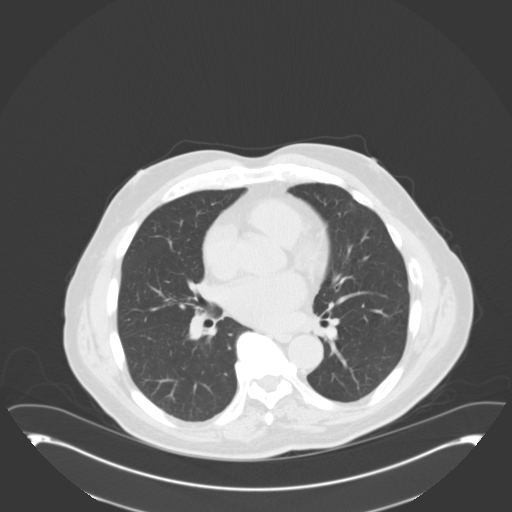
[im 34/67  lung]
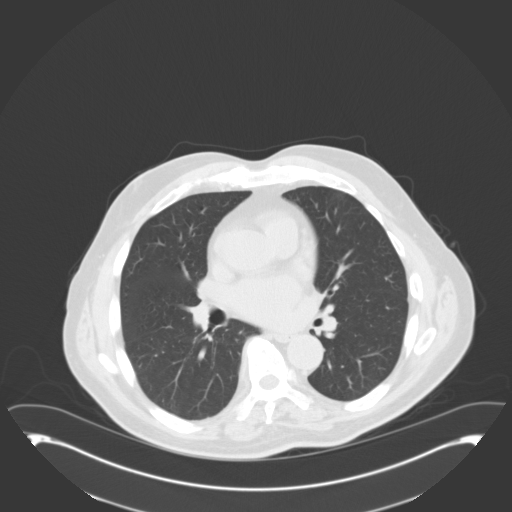
[im 38/67  mediastinal]
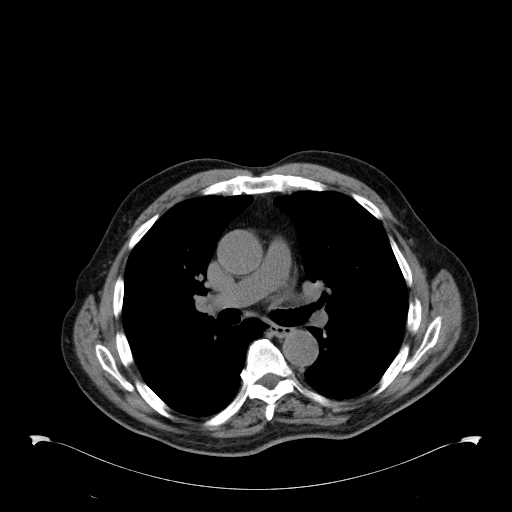
[im 38/67  lung]
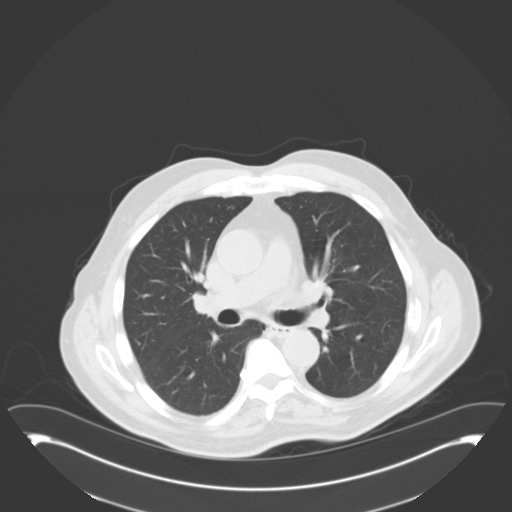
[im 43/67  lung]
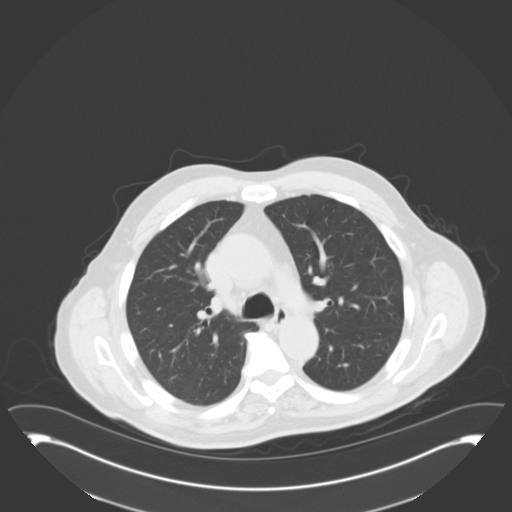
[im 48/67  lung]
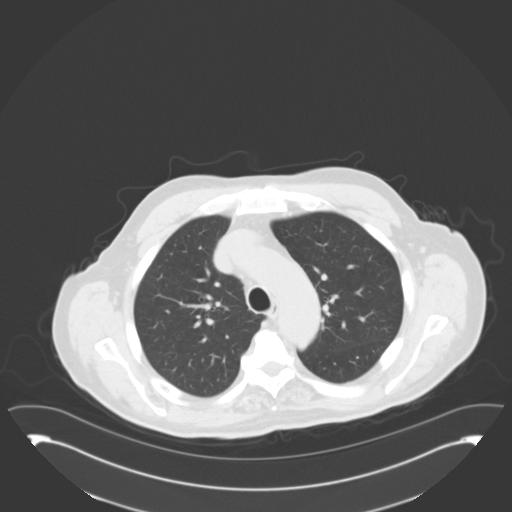
[im 52/67  lung]
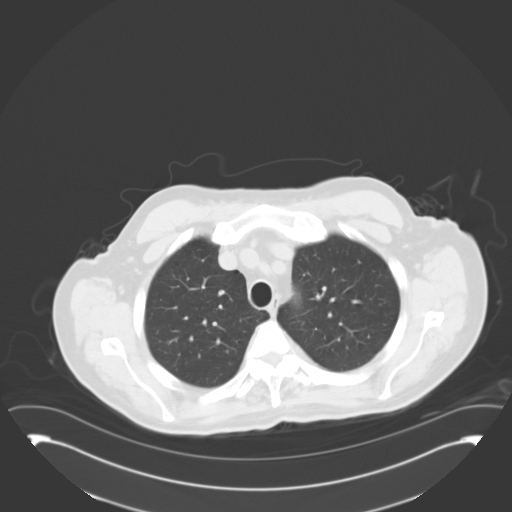
[im 57/67  mediastinal]
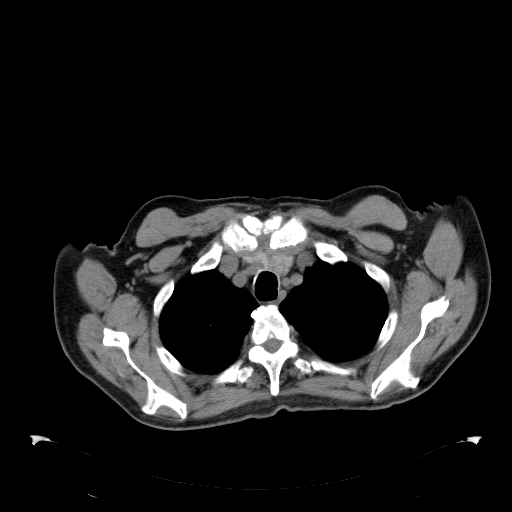
[im 57/67  lung]
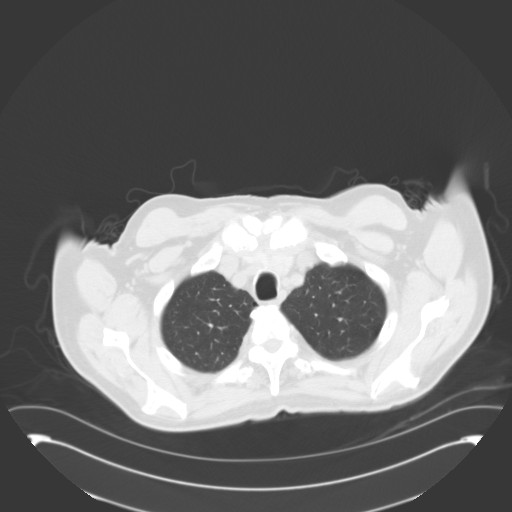
[im 62/67  lung]
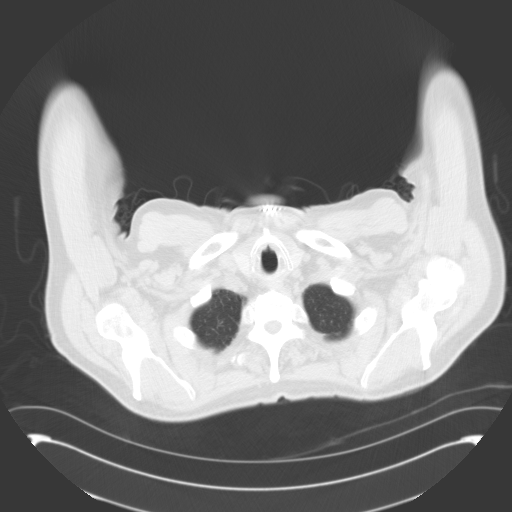

[14 of 30 positions shown; findings below may reference images not displayed]

FINDINGS: Mediastinum/Nodes: There are no enlarged mediastinal, hilar or
axillary lymph nodes. The thyroid gland, trachea and esophagus
demonstrate no significant findings. The heart size is normal. There
is no pericardial effusion.There are faint coronary artery
calcifications.

Lungs/Pleura: There is no pleural effusion. The overall basilar
aeration has improved compared with the recent prior studies. There
is minimal lingular atelectasis. No confluent airspace opacity or
suspicious pulmonary nodule.

Upper abdomen: Previously demonstrated colonic distention has
resolved. There is moderate stool throughout the colon. Low-density
hepatic lesions are grossly stable.

Musculoskeletal/Chest wall: There is no chest wall mass or
suspicious osseous finding. Mild thoracic spine degenerative changes
noted.
IMPRESSION: 1. No evidence of thoracic metastatic disease.
2. Interval resolution of colonic obstruction status post partial
colon resection.

## 2017-07-06 ENCOUNTER — Other Ambulatory Visit: Payer: Self-pay | Admitting: Internal Medicine

## 2017-07-06 DIAGNOSIS — I10 Essential (primary) hypertension: Secondary | ICD-10-CM

## 2017-07-10 ENCOUNTER — Telehealth: Payer: Self-pay

## 2017-07-10 NOTE — Telephone Encounter (Signed)
amLODipine (NORVASC) 5 MG tablet, requesting refill @ [facility] on [address]

## 2017-07-31 DIAGNOSIS — N3020 Other chronic cystitis without hematuria: Secondary | ICD-10-CM | POA: Diagnosis not present

## 2017-07-31 DIAGNOSIS — R972 Elevated prostate specific antigen [PSA]: Secondary | ICD-10-CM | POA: Diagnosis not present

## 2017-09-04 DIAGNOSIS — R338 Other retention of urine: Secondary | ICD-10-CM | POA: Diagnosis not present

## 2017-09-06 ENCOUNTER — Other Ambulatory Visit: Payer: Self-pay | Admitting: *Deleted

## 2017-09-07 MED ORDER — PRAVASTATIN SODIUM 20 MG PO TABS: 20.0000 mg | ORAL_TABLET | Freq: Every day | ORAL | 4 refills | Status: DC

## 2017-09-09 ENCOUNTER — Ambulatory Visit (INDEPENDENT_AMBULATORY_CARE_PROVIDER_SITE_OTHER): Payer: Medicare Other | Admitting: Internal Medicine

## 2017-09-09 ENCOUNTER — Other Ambulatory Visit: Payer: Self-pay | Admitting: Internal Medicine

## 2017-09-09 ENCOUNTER — Encounter (INDEPENDENT_AMBULATORY_CARE_PROVIDER_SITE_OTHER): Payer: Self-pay

## 2017-09-09 ENCOUNTER — Encounter: Payer: Self-pay | Admitting: Internal Medicine

## 2017-09-09 ENCOUNTER — Encounter: Payer: Medicare Other | Admitting: Internal Medicine

## 2017-09-09 VITALS — BP 139/79 | HR 67 | Temp 97.9°F | Wt 205.4 lb

## 2017-09-09 DIAGNOSIS — J069 Acute upper respiratory infection, unspecified: Secondary | ICD-10-CM

## 2017-09-09 DIAGNOSIS — H6123 Impacted cerumen, bilateral: Secondary | ICD-10-CM | POA: Diagnosis not present

## 2017-09-09 DIAGNOSIS — E785 Hyperlipidemia, unspecified: Secondary | ICD-10-CM

## 2017-09-09 DIAGNOSIS — I10 Essential (primary) hypertension: Secondary | ICD-10-CM

## 2017-09-09 DIAGNOSIS — H6120 Impacted cerumen, unspecified ear: Secondary | ICD-10-CM | POA: Insufficient documentation

## 2017-09-09 DIAGNOSIS — Z23 Encounter for immunization: Secondary | ICD-10-CM

## 2017-09-09 DIAGNOSIS — Z0000 Encounter for general adult medical examination without abnormal findings: Secondary | ICD-10-CM

## 2017-09-09 MED ORDER — AMLODIPINE BESYLATE 5 MG PO TABS: 5.0000 mg | ORAL_TABLET | Freq: Every day | ORAL | 5 refills | Status: DC

## 2017-09-09 MED ORDER — CHLORPHENIRAMINE-ACETAMINOPHEN 2-325 MG PO TABS: 1.0000 | ORAL_TABLET | Freq: Four times a day (QID) | ORAL | 0 refills | Status: DC | PRN

## 2017-09-09 MED ORDER — FLUTICASONE PROPIONATE 50 MCG/ACT NA SUSP: 2.0000 | Freq: Every day | NASAL | 1 refills | Status: DC

## 2017-09-09 NOTE — Assessment & Plan Note (Signed)
Received flu shot today. 

## 2017-09-09 NOTE — Progress Notes (Signed)
Case discussed with Dr. Guilloud at the time of the visit. We reviewed the resident's history and exam and pertinent patient test results. I agree with the assessment, diagnosis, and plan of care documented in the resident's note. 

## 2017-09-09 NOTE — Patient Instructions (Signed)
Mr. Frigon,  It was a pleasure to see you today. Please continue to take amlodipine daily as previously prescribed. For your cold, you may take coricidin every 6 hours as needed and flonase 2 sprays each nostril daily for the next 1-2 weeks until symptoms resolve. Please follow up with me in 6 months or sooner if needed. If you have any questions or concerns, call our clinic at [phone] or after hours call [phone] and ask for the internal medicine resident on call. Thank you!  - Dr. Philipp Ovens

## 2017-09-09 NOTE — Progress Notes (Signed)
   CC: HTN follow up   HPI:  Mr.Calhoun Avitia is a 76 y.o. male with past medical history outlined below here for HTN follow up. For the details of today's visit, please refer to the assessment and plan.  Past Medical History:  Diagnosis Date  . 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 [name] (benign prostatic hyperplasia)    hyposensitive bladder, nodular prostate with increased PSA (10.23 in 03/2006), s/p prostate biopsy 2006 that was normal (Dr Jeffie Pollock) he recommended TURP.  . Cancer ([place])   . Chronic knee pain    left  . Epididymitis    right  . Frozen shoulder    left frozen shoulder  . Gingivitis   . Hyperlipidemia   . Hypertension   . Right shoulder pain    sx in 06/2005  . Shingles   . Sinus bradycardia     Review of Systems  Cardiovascular: Negative for leg swelling.  Neurological: Negative for dizziness.     Physical Exam:  Vitals:   09/09/17 1356  BP: 139/79  Pulse: 67  Temp: 97.9 F (36.6 C)  TempSrc: Oral  SpO2: 100%  Weight: 205 lb 6.4 oz (93.2 kg)    Constitutional: NAD, appears comfortable Cardiovascular: RRR, no murmurs, rubs, or gallops.  Pulmonary/Chest: CTAB, no wheezes, rales, or rhonchi.  Extremities: Warm and well perfused. No edema.  Psychiatric: Normal mood and affect  Assessment & Plan:   See Encounters Tab for problem based charting.  Patient discussed with Dr. Eppie Gibson

## 2017-09-09 NOTE — Progress Notes (Signed)
Left ear successfully irrigated with hydrogen peroxide and water.Despina Hidden Cassady11/19/20183:09 PM

## 2017-09-09 NOTE — Assessment & Plan Note (Signed)
Bilateral, L>R. Irrigated today with hydrogen peroxide solution.

## 2017-09-09 NOTE — Assessment & Plan Note (Signed)
Patient is complaining of 2 weeks of rhinorrhea, congestion, and cough. Denies fevers or difficulty breathing. Taking OTC mucinex and decongestants. Advised against decongestants in the setting of his HTN.  -- Coricidin HBP q6 PRN -- Flonase 2 sprays each nostril daily 1-2 weeks -- Follow up as needed

## 2017-09-09 NOTE — Assessment & Plan Note (Signed)
BP today is at goal, 139/79. Tolerating medication well without side effects.  -- Continue Amlodipine 5 mg daily  -- F/u 6 months

## 2017-09-11 DIAGNOSIS — R338 Other retention of urine: Secondary | ICD-10-CM | POA: Diagnosis not present

## 2017-09-20 NOTE — Progress Notes (Signed)
Greg [name]  [phone] [phone]  Clinic Follow up Note   Patient Care Team: Velna Ochs, MD as PCP - General (Internal Medicine) Judeth Horn, MD as Consulting Physician (General Surgery) Truitt Merle, MD as Consulting Physician (Hematology) Irine Seal, MD as Attending Physician (Urology) Tania Ade, RN as Registered Nurse (Medical Oncology) 09/23/2017  CHIEF COMPLAINTS:  Follow up stage III colon cancer   Oncology History   Colon adenocarcinoma   Staging form: Colon and Rectum, AJCC 7th Edition     Pathologic stage from 03/12/2015: Stage IIIB (T4a, N1c, cM0) - Signed by Truitt Merle, MD on 04/05/2015        Cancer of sigmoid colon ([place])   02/27/2015 Imaging    CT abdomen and pelvis showed an annular constricting lesion within the sigmoid colon, no significant adenopathy or distant metastasis. CT abdomen and pelvis on 03/11/2015 showed similar findings with obstruction.      03/12/2015 Initial Diagnosis    Sigmoid colon adenocarcinoma      03/12/2015 Tumor Marker    CEA normal 2.5      03/12/2015 Surgery    Sigmoid colon segmental resection by Dr. Excell Seltzer, surgical margins were negative.      03/12/2015 Pathology Results    Well differentiated invasive adenocarcinoma with associated abundant mucin, 14 lymph nodes were negative, a tumor deposit was present, surgical margins were negative. pT4N1c      04/26/2015 - 10/20/2015 Chemotherapy    Capecitabine po 2047m q12h, 2 weeks on, one week off, as adjuvant chemo       12/02/2015 Imaging    CT chest, abdomen and pelvis with contrast showed a moderately enlarged left external iliac lymph node, 1 cm, indeterminate, interval decrease size of previously described subcentimeter perirectal nodule, no other evidence of metastatic disease.      12/27/2016 Imaging    CT CAP w contrast  IMPRESSION: 1. Similar size of a left external iliac node, which is not pathologic by size criteria.  Otherwise, no evidence of metastatic disease in the chest, abdomen, or pelvis. 2. Tiny liver lesions are felt to be similar over prior exams but warrant followup attention. 3. 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 4. Esophageal air fluid level suggests dysmotility or gastroesophageal reflux.       HISTORY OF PRESENTING ILLNESS:  Greg [name] 76y.o. male is here because of stage III colon cancer. He was referred after his recent hospitalization.   He has been having abdominal discomfort and nausea for the past few months, along with some blood in his stool. No significant weight loss or change of his appetite. He presented to the ED 02/27/15 for abdominal pain with x-ray showing possible obstruction but CT scan negative for obstruction or ileus but it did indicate an annular constricting lesion in the sigmoid. He was seen by GI Dr OPaulita Fujitaper colonoscopy failed due to the poor prep. There is warranted. He knows what He presented to ED again on 03/12/2015. CT abd/pelvis showed annular constricting lesion within the sigmoid colon with markedly dilated colon proximal to this lesion with air-fluid levels, compatible with distal colonic obstructing lesion concerning for malignancy as well as tiny hypodensities scattered throughout the liver, nonspecific but favor small cysts. General surgery was called and performed sigmoid colectomy with colostomy on 03/12/2015 by Dr. WHulen Skains The patient's post-operative course was complicated only by hypokalemia which was repeatedly replenished with IV and oral KCl. He received IV hydration  and pain management and his diet was slowly advanced as tolerated. Physical and occupational therapy saw the patient and made recommendations. He was discharged to home with home health PT and ostomy care on 03/19/2015.   He has been recovering well from the surgery. He has no significant pain at the surgical  site, no nausea, normal stool output in the colostomy bag. He has a mild to moderate fatigue, able to take care of himself and tolerating daily activities. He lives alone, uses walker as needed. He feels his energy level has been recovered well. He has a brother who lives for 5 blocks away, and helps him out when he needs.  CURRENT THERAPY: Surveillance  INTERIM HISTORY:  Greg [name] returns for follow up. He was last seen by me 6 months ago. He presents to the clinic today noting he has tightness in his arm after getting his blood drawn. He notes he had a checkup for a cold at the outpatient clinic. He was given nasal decongestant. His symptoms have improved. He denies fever and cough. He did receive flu shot for this year. He denies BM issues. He plans to have colonoscopy soon before his colostomy reversal.    MEDICAL HISTORY:  Past Medical History:  Diagnosis Date  . 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 [name] (benign prostatic hyperplasia)    hyposensitive bladder, nodular prostate with increased PSA (10.23 in 03/2006), s/p prostate biopsy 2006 that was normal (Dr Jeffie Pollock) he recommended TURP.  . Cancer ([place])   . Chronic knee pain    left  . Epididymitis    right  . Frozen shoulder    left frozen shoulder  . Gingivitis   . Hyperlipidemia   . Hypertension   . Right shoulder pain    sx in 06/2005  . Shingles   . Sinus bradycardia     SURGICAL HISTORY: Past Surgical History:  Procedure Laterality Date  . COLOSTOMY N/A 03/12/2015   Procedure: COLOSTOMY;  Surgeon: Judeth Horn, MD;  Location: [place];  Service: General;  Laterality: N/A;  . COLOSTOMY REVISION N/A 03/12/2015   Procedure:  RESECTION SIGMOID COLON;  Surgeon: Judeth Horn, MD;  Location: [place];  Service: General;  Laterality: N/A;  . hydrocelectomy  11/14/2010   left hydrocelectomy  . KNEE SURGERY     L knee  .  LAPAROTOMY N/A 03/12/2015   Procedure: EXPLORATORY LAPAROTOMY;  Surgeon: Judeth Horn, MD;  Location: [place];  Service: General;  Laterality: N/A;  . right shoulder arthroscopic surgery  06/2005  . TRANSURETHRAL RESECTION OF PROSTATE     status post TURP 2/2 [name] in 2008  . VASECTOMY      SOCIAL HISTORY: Social History   Socioeconomic History  . Marital status: Single    Spouse name: Not on file  . Number of children: Not on file  . Years of education: 79  . Highest education level: Not on file  Social Needs  . Financial resource strain: Not on file  . Food insecurity - worry: Not on file  . Food insecurity - inability: Not on file  . Transportation needs - medical: Not on file  . Transportation needs - non-medical: Not on file  Occupational History    Employer: RETIRED    Comment: retired  Tobacco Use  . Smoking status: Former Smoker    Packs/day: 0.50    Years: 25.00  Pack years: 12.50    Types: Cigarettes    Last attempt to quit: 02/07/1988    Years since quitting: 29.6  . Smokeless tobacco: Never Used  Substance and Sexual Activity  . Alcohol use: Yes    Alcohol/week: 0.0 oz    Comment: occasional   . Drug use: No  . Sexual activity: Not on file  Other Topics Concern  . Not on file  Social History Narrative   Single, lives alone in apartment   No children   Several local siblings in area-will take him to grocery store, etc   Rides bus (75cents/trip) and bus is right outside his house   Prior employment [employer]   Reports strong faith helps him cope with life   Has had to self-cath twice daily since 2008 -Dr. Jeffie Pollock   [facility] for ostomy care and supplies   Ambulates w/walker-independent in ADL's-cooks pot pies and frozen dinners    FAMILY HISTORY: Family History  Problem Relation Age of Onset  . Cancer Father        unknown type cancer     ALLERGIES:  is allergic to ciprofloxacin hcl and ibuprofen.  MEDICATIONS:  Current Outpatient  Medications  Medication Sig Dispense Refill  . amLODipine (NORVASC) 5 MG tablet Take 1 tablet (5 mg total) daily by mouth. 30 tablet 5  . cetirizine (ZYRTEC) 10 MG tablet Take 10 mg by mouth at bedtime.    . Chlorpheniramine-APAP (CORICIDIN) 2-325 MG TABS Take 1 tablet every 6 (six) hours as needed by mouth. 56 each 0  . fluticasone (FLONASE) 50 MCG/ACT nasal spray Place 2 sprays daily into both nostrils. 16 g 1  . pravastatin (PRAVACHOL) 20 MG tablet Take 1 tablet (20 mg total) daily by mouth. 90 tablet 4   No current facility-administered medications for this visit.     REVIEW OF SYSTEMS:    Constitutional: Denies fevers, chills or abnormal night sweats  Eyes: Denies blurriness of vision, double vision or watery eyes Ears, nose, mouth, throat, and face: Denies mucositis or sore throat (+) nasal congestion improving.  Respiratory: Denies cough, dyspnea or wheezes Cardiovascular: Denies palpitation, chest discomfort or lower extremity swelling Gastrointestinal:  Denies nausea, heartburn or change in bowel habits Skin: see HPI  Lymphatics: Denies new lymphadenopathy or easy bruising Neurological:Denies numbness, tingling or new weaknesses Behavioral/Psych: Mood is stable, no new changes  All other systems were reviewed with the patient and are negative.   PHYSICAL EXAMINATION: ECOG PERFORMANCE STATUS: 1 - Symptomatic but completely ambulatory  Vitals:   09/23/17 1031  BP: 122/73  Pulse: 60  Resp: 18  Temp: (!) 97 F (36.1 C)  SpO2: 99%   Filed Weights   09/23/17 1031  Weight: 202 lb 8 oz (91.9 kg)    GENERAL:alert, no distress and comfortable SKIN: skin color, texture, turgor are normal, significant skin pigmentation, thickening, and mild skin peeling on the palms and feet EYES: normal, conjunctiva are pink and non-injected, sclera clear OROPHARYNX:no exudate, no erythema and lips, buccal mucosa, and tongue normal  NECK: supple, thyroid normal size, non-tender, without  nodularity LYMPH:  no palpable lymphadenopathy in the cervical, axillary or inguinal LUNGS: clear to auscultation and percussion with normal breathing effort HEART: regular rate & rhythm and no murmurs and no lower extremity edema ABDOMEN:abdomen soft, non-tender and normal bowel sounds, surgical incision is well healed, (+) colostomy bag in the left abdomen. Musculoskeletal:no cyanosis of digits and no clubbing  PSYCH: alert & oriented x 3  with fluent speech NEURO: no focal motor/sensory deficits  LABORATORY DATA:  I have reviewed the data as listed  CBC Latest Ref Rng & Units 09/23/2017 03/22/2017 11/16/2016  WBC 4.0 - 10.3 10e3/uL 5.5 6.2 5.6  Hemoglobin 13.0 - 17.1 g/dL 14.6 14.5 14.6  Hematocrit 38.4 - 49.9 % 45.5 44.4 45.1  Platelets 140 - 400 10e3/uL 245 215 257    CMP Latest Ref Rng & Units 09/23/2017 03/22/2017 11/16/2016  Glucose 70 - 140 mg/dl 93 86 108  BUN 7.0 - 26.0 mg/dL 11.8 13.0 16.4  Creatinine 0.7 - 1.3 mg/dL 1.2 1.0 1.1  Sodium 136 - 145 mEq/L 141 141 138  Potassium 3.5 - 5.1 mEq/L 5.0 3.9 4.8  Chloride 101 - 111 mmol/L - - -  CO2 22 - 29 mEq/L 28 26 28   Calcium 8.4 - 10.4 mg/dL 9.1 8.7 9.3  Total Protein 6.4 - 8.3 g/dL 7.9 7.2 7.8  Total Bilirubin 0.20 - 1.20 mg/dL 0.60 0.56 0.60  Alkaline Phos 40 - 150 U/L 93 86 98  AST 5 - 34 U/L 22 19 18   ALT 0 - 55 U/L 17 10 9     CEA (IN HOUSE-[facility])  Order: [id number]  Status:  Final result Visible to patient:  No (Not Released) Next appt:  None   Ref Range & Units 10:10 45moago  CEA ([facility]-In House) 0.00 - 5.00 ng/mL 1.85  1.37 CM         PATHOLOGY REPORT  Diagnosis 03/12/2015 Colon, segmental resection for tumor, sigmoid colon - INVASIVE WELL-DIFFERENTIATED ADENOCARCINOMA WITH ASSOCIATED ABUNDANT MUCIN, SPANNING 7 CM IN GREATEST DIMENSION. 1 of 4 - TUMOR INVADES THROUGH MUSCULARIS PROPRIA THROUGH PERICOLORECTAL SOFT TISSUES TO INVOLVE SEROSAL SURFACE. - MARGINS ARE NEGATIVE. - FOURTEEN BENIGN LYMPH NODES WITH NO  TUMOR SEEN (0/14). - SINGLE SOFT TISSUE SATELLITE  Microscopic Comment COLON AND RECTUM: Specimen: Sigmoid colon. Procedure: Resection of sigmoid colon. Tumor site: Sigmoid colon, distal aspect of specimen. Specimen integrity: Intact.  Macroscopic tumor perforation: Not identified. Invasive tumor: Maximum size: 7 cm. Histologic type(s): Invasive adenocarcinoma with abundant extracellular mucin. Histologic grade and differentiation: G1: well differentiated/low grade Type of polyp in which invasive carcinoma arose: Precursor polyp is not identified. Microscopic extension of invasive tumor: Tumor invades through muscularis propria through pericolorectal soft tissues to involve serosal surface. Lymph-Vascular invasion: Definitive lymph/vascular invasion is not identified; however, a tumor deposit is present, see below. Peri-neural invasion: Not identified. Tumor deposit(s) (discontinuous extramural extension): Yes, tumor deposit present. Resection margins: Proximal margin: Negative. Distal margin: Negative. Mesenteric margin (sigmoid and transverse): Negative. Distance closest margin (if all above margins negative): 3.5 cm (mesenteric margin). Treatment effect (neo-adjuvant therapy): Not applicable. Additional polyp(s): No additional polyps identified. Non-neoplastic findings: No significant non-neoplastic findings. Lymph nodes: number examined 14; number positive: 0. Pathologic Staging: pT4a, pN1c. Ancillary studies: Per colorectal cancer protocol, MMR by IHC and MSI by PCR will be performed on the current tumor. (RH:ecj 03/16/2015)  ADDITIONAL INFORMATION: Mismatch Repair (MMR) Protein Immunohistochemistry (IHC) IHC Expression Result: MLH1: Preserved nuclear expression (greater 50% tumor expression) MSH2: Preserved nuclear expression (greater 50% tumor expression) MSH6: Preserved nuclear expression (greater 50% tumor expression) PMS2: Preserved nuclear expression (greater 50%  tumor expression) * Internal control demonstrates intact nuclear expression Interpretation: NORMAL   RADIOGRAPHIC STUDIES: I have personally reviewed the radiological images as listed and agreed with the findings in the report.  CT CAP w contrast 12/27/2016 IMPRESSION: 1. Similar size of a left external iliac node, which is not pathologic  by size criteria. Otherwise, no evidence of metastatic disease in the chest, abdomen, or pelvis. 2. Tiny liver lesions are felt to be similar over prior exams but warrant followup attention. 3. 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 4. Esophageal air fluid level suggests dysmotility or gastroesophageal reflux.  Ct Abdomen Pelvis W Contrast 05/11/2016 IMPRESSION: 1. Slight interval decrease in left pelvic sidewall lymph nodes seen previously. Continued attention on follow-up recommended. 2. Mildly increased conspicuity of the tiny low-density hepatic dome lesion. Attention on follow-up recommended. 3. Otherwise no findings to suggest definite metastatic disease in the abdomen or pelvis. 4. Bladder distention, stable.  ASSESSMENT & PLAN: 76 y.o.  African-American male  1. Sigmoid colon cancer, pT4N1cM0, stage IIIB, grade 1, MMR normal -I previously reviewed his initial CT abdomen and pelvis findings, and his surgical pathology results in great details with the patient. -Giving the advanced stage IIIB disease, he does have moderate to high risk of cancer recurrence after surgery. -He has completed adjuvant Xeloda, tolerated very well.  -I previously reviewed his restaging CT abdomen and pelvis from 05/11/2016, no definitive evidence of cancer recurrence. Left external iliac lymph node has decreased in size, the tiny lesion in the liver performed has been overall stable over a year, likely benign. -I reviewed his restaging CT chest, abdomen and pelvis from 12/27/2016, which showed no  evidence of recurrence, stable tiny liver lesion and left iliac lymph node, likely benign -I previously encouraged him to follow up with Dr. Hulen Skains for colonoscopy and colostomy revision. He does want to have the surgery, but needs a ride for the appointment, he is waiting his brother's car to be ready.  -He plans to proceed with colonoscopy soon.  -he is currently doing well, exam was unremarkable, lab reviewed, tumor marker CEA is normal.  No clinical concern for recurrence. -It has been 2.5 years since diagnosis. After 3 years there is a significantly lower risk of recurrence. Will have next CT scan before next visit.  -Continue surveillance, I'll see him back in 6 months    2.  Atony of bladder -He does straight cath twice a day, and on chronic Bactrim to prevent UTI. -He'll continue follow-up with his urologist.  3. Hypertension, arthritis -He will continue follow-up with his primary care physician -His blood pressure is not well controlled, I previously strongly encouraged him to follow-up with his primary care physician.    Plan -Continue surveillance.  F/u in 6 months -Lab and CT CAP with contrast a week before visit  -Proceed with colonoscopy and colostomy taken down  Surgery soon    All questions were answered. The patient knows to call the clinic with any problems, questions or concerns. I spent 20 minutes counseling the patient face to face. The total time spent in the appointment was 25 minutes and more than 50% was on counseling.  This document serves as a record of services personally performed by Truitt Merle, MD. It was created on her behalf by Joslyn Devon, a trained medical scribe. The creation of this record is based on the scribe's personal observations and the provider's statements to them.    I have reviewed the above documentation for accuracy and completeness, and I agree with the above.   Truitt Merle, MD 09/23/2017

## 2017-09-23 ENCOUNTER — Encounter: Payer: Self-pay | Admitting: Hematology

## 2017-09-23 ENCOUNTER — Other Ambulatory Visit (HOSPITAL_BASED_OUTPATIENT_CLINIC_OR_DEPARTMENT_OTHER): Payer: Medicare Other

## 2017-09-23 ENCOUNTER — Ambulatory Visit (HOSPITAL_BASED_OUTPATIENT_CLINIC_OR_DEPARTMENT_OTHER): Payer: Medicare Other | Admitting: Hematology

## 2017-09-23 VITALS — BP 122/73 | HR 60 | Temp 97.0°F | Resp 18 | Ht 71.0 in | Wt 202.5 lb

## 2017-09-23 DIAGNOSIS — I10 Essential (primary) hypertension: Secondary | ICD-10-CM

## 2017-09-23 DIAGNOSIS — C187 Malignant neoplasm of sigmoid colon: Secondary | ICD-10-CM | POA: Diagnosis not present

## 2017-09-23 DIAGNOSIS — N312 Flaccid neuropathic bladder, not elsewhere classified: Secondary | ICD-10-CM

## 2017-09-23 LAB — COMPREHENSIVE METABOLIC PANEL
ALBUMIN: 3.9 g/dL (ref 3.5–5.0)
ALK PHOS: 93 U/L (ref 40–150)
ALT: 17 U/L (ref 0–55)
AST: 22 U/L (ref 5–34)
Anion Gap: 8 mEq/L (ref 3–11)
BILIRUBIN TOTAL: 0.6 mg/dL (ref 0.20–1.20)
BUN: 11.8 mg/dL (ref 7.0–26.0)
CO2: 28 mEq/L (ref 22–29)
CREATININE: 1.2 mg/dL (ref 0.7–1.3)
Calcium: 9.1 mg/dL (ref 8.4–10.4)
Chloride: 104 mEq/L (ref 98–109)
GLUCOSE: 93 mg/dL (ref 70–140)
Potassium: 5 mEq/L (ref 3.5–5.1)
Sodium: 141 mEq/L (ref 136–145)
TOTAL PROTEIN: 7.9 g/dL (ref 6.4–8.3)

## 2017-09-23 LAB — CBC WITH DIFFERENTIAL/PLATELET
BASO%: 1.1 % (ref 0.0–2.0)
Basophils Absolute: 0.1 10*3/uL (ref 0.0–0.1)
EOS%: 1.5 % (ref 0.0–7.0)
Eosinophils Absolute: 0.1 10*3/uL (ref 0.0–0.5)
HCT: 45.5 % (ref 38.4–49.9)
HGB: 14.6 g/dL (ref 13.0–17.1)
LYMPH#: 1.6 10*3/uL (ref 0.9–3.3)
LYMPH%: 28.9 % (ref 14.0–49.0)
MCH: 31.6 pg (ref 27.2–33.4)
MCHC: 32.1 g/dL (ref 32.0–36.0)
MCV: 98.5 fL — ABNORMAL HIGH (ref 79.3–98.0)
MONO#: 0.7 10*3/uL (ref 0.1–0.9)
MONO%: 11.9 % (ref 0.0–14.0)
NEUT#: 3.1 10*3/uL (ref 1.5–6.5)
NEUT%: 56.6 % (ref 39.0–75.0)
Platelets: 245 10*3/uL (ref 140–400)
RBC: 4.62 10*6/uL (ref 4.20–5.82)
RDW: 14 % (ref 11.0–14.6)
WBC: 5.5 10*3/uL (ref 4.0–10.3)

## 2017-09-23 LAB — CEA (IN HOUSE-CHCC): CEA (CHCC-In House): 1.85 ng/mL (ref 0.00–5.00)

## 2017-09-24 ENCOUNTER — Telehealth: Payer: Self-pay | Admitting: Hematology

## 2017-09-24 DIAGNOSIS — R338 Other retention of urine: Secondary | ICD-10-CM | POA: Diagnosis not present

## 2017-09-24 NOTE — Telephone Encounter (Signed)
Spoke to patient regarding upcoming June appointments  °

## 2017-11-26 DIAGNOSIS — R338 Other retention of urine: Secondary | ICD-10-CM | POA: Diagnosis not present

## 2018-01-23 DIAGNOSIS — R338 Other retention of urine: Secondary | ICD-10-CM | POA: Diagnosis not present

## 2018-01-29 DIAGNOSIS — N3020 Other chronic cystitis without hematuria: Secondary | ICD-10-CM | POA: Diagnosis not present

## 2018-01-29 DIAGNOSIS — R972 Elevated prostate specific antigen [PSA]: Secondary | ICD-10-CM | POA: Diagnosis not present

## 2018-03-14 DIAGNOSIS — R338 Other retention of urine: Secondary | ICD-10-CM | POA: Diagnosis not present

## 2018-03-20 ENCOUNTER — Other Ambulatory Visit: Payer: Self-pay | Admitting: Internal Medicine

## 2018-03-20 DIAGNOSIS — I10 Essential (primary) hypertension: Secondary | ICD-10-CM

## 2018-03-24 ENCOUNTER — Inpatient Hospital Stay: Payer: Medicare Other | Attending: Hematology

## 2018-03-24 ENCOUNTER — Ambulatory Visit (HOSPITAL_COMMUNITY)
Admission: RE | Admit: 2018-03-24 | Discharge: 2018-03-24 | Disposition: A | Discharge status: Home / Self Care | Payer: Medicare Other | Source: Ambulatory Visit | Attending: Hematology | Admitting: Hematology

## 2018-03-24 DIAGNOSIS — R001 Bradycardia, unspecified: Secondary | ICD-10-CM | POA: Diagnosis not present

## 2018-03-24 DIAGNOSIS — Z9221 Personal history of antineoplastic chemotherapy: Secondary | ICD-10-CM | POA: Insufficient documentation

## 2018-03-24 DIAGNOSIS — N3289 Other specified disorders of bladder: Secondary | ICD-10-CM | POA: Diagnosis not present

## 2018-03-24 DIAGNOSIS — K769 Liver disease, unspecified: Secondary | ICD-10-CM | POA: Insufficient documentation

## 2018-03-24 DIAGNOSIS — K435 Parastomal hernia without obstruction or  gangrene: Secondary | ICD-10-CM | POA: Insufficient documentation

## 2018-03-24 DIAGNOSIS — M25569 Pain in unspecified knee: Secondary | ICD-10-CM | POA: Insufficient documentation

## 2018-03-24 DIAGNOSIS — N400 Enlarged prostate without lower urinary tract symptoms: Secondary | ICD-10-CM | POA: Insufficient documentation

## 2018-03-24 DIAGNOSIS — N312 Flaccid neuropathic bladder, not elsewhere classified: Secondary | ICD-10-CM | POA: Insufficient documentation

## 2018-03-24 DIAGNOSIS — Z87891 Personal history of nicotine dependence: Secondary | ICD-10-CM | POA: Diagnosis not present

## 2018-03-24 DIAGNOSIS — K219 Gastro-esophageal reflux disease without esophagitis: Secondary | ICD-10-CM | POA: Diagnosis not present

## 2018-03-24 DIAGNOSIS — E785 Hyperlipidemia, unspecified: Secondary | ICD-10-CM | POA: Insufficient documentation

## 2018-03-24 DIAGNOSIS — Z933 Colostomy status: Secondary | ICD-10-CM | POA: Insufficient documentation

## 2018-03-24 DIAGNOSIS — C187 Malignant neoplasm of sigmoid colon: Secondary | ICD-10-CM

## 2018-03-24 DIAGNOSIS — Z79899 Other long term (current) drug therapy: Secondary | ICD-10-CM | POA: Diagnosis not present

## 2018-03-24 DIAGNOSIS — G8929 Other chronic pain: Secondary | ICD-10-CM | POA: Diagnosis not present

## 2018-03-24 DIAGNOSIS — I10 Essential (primary) hypertension: Secondary | ICD-10-CM | POA: Diagnosis not present

## 2018-03-24 LAB — COMPREHENSIVE METABOLIC PANEL
ALT: 9 U/L (ref 0–55)
ANION GAP: 8 (ref 3–11)
AST: 18 U/L (ref 5–34)
Albumin: 3.9 g/dL (ref 3.5–5.0)
Alkaline Phosphatase: 95 U/L (ref 40–150)
BUN: 13 mg/dL (ref 7–26)
CALCIUM: 9.2 mg/dL (ref 8.4–10.4)
CHLORIDE: 104 mmol/L (ref 98–109)
CO2: 28 mmol/L (ref 22–29)
Creatinine, Ser: 1.1 mg/dL (ref 0.70–1.30)
GFR calc non Af Amer: >60 mL/min (ref >60)
Glucose, Bld: 89 mg/dL (ref 70–140)
POTASSIUM: 4.6 mmol/L (ref 3.5–5.1)
SODIUM: 140 mmol/L (ref 136–145)
Total Bilirubin: 0.4 mg/dL (ref 0.2–1.2)
Total Protein: 7.8 g/dL (ref 6.4–8.3)

## 2018-03-24 LAB — CBC WITH DIFFERENTIAL/PLATELET
BASOS PCT: 1 %
Basophils Absolute: 0.1 10*3/uL (ref 0.0–0.1)
Eosinophils Absolute: 0.1 10*3/uL (ref 0.0–0.5)
Eosinophils Relative: 1 %
HCT: 42.9 % (ref 38.4–49.9)
Hemoglobin: 13.9 g/dL (ref 13.0–17.1)
LYMPHS ABS: 1.4 10*3/uL (ref 0.9–3.3)
Lymphocytes Relative: 18 %
MCH: 31.2 pg (ref 27.2–33.4)
MCHC: 32.4 g/dL (ref 32.0–36.0)
MCV: 96.1 fL (ref 79.3–98.0)
MONOS PCT: 9 %
Monocytes Absolute: 0.7 10*3/uL (ref 0.1–0.9)
NEUTROS ABS: 5.7 10*3/uL (ref 1.5–6.5)
NEUTROS PCT: 71 %
Platelets: 215 10*3/uL (ref 140–400)
RBC: 4.47 MIL/uL (ref 4.20–5.82)
RDW: 14.1 % (ref 11.0–14.6)
WBC: 7.9 10*3/uL (ref 4.0–10.3)

## 2018-03-24 LAB — CEA (IN HOUSE-CHCC): CEA (CHCC-In House): 1.82 ng/mL (ref 0.00–5.00)

## 2018-03-24 MED ORDER — IOPAMIDOL (ISOVUE-300) INJECTION 61%
INTRAVENOUS | Status: AC
  Filled 2018-03-24: qty 100

## 2018-03-24 MED ORDER — IOPAMIDOL (ISOVUE-300) INJECTION 61%
100.0000 mL | Freq: Once | INTRAVENOUS | Status: AC | PRN
  Administered 2018-03-24: 100 mL via INTRAVENOUS

## 2018-03-24 NOTE — Telephone Encounter (Signed)
Refilled amlodipine. Please have patient schedule a follow up appointment. Thank you.

## 2018-03-31 ENCOUNTER — Telehealth: Payer: Self-pay | Admitting: *Deleted

## 2018-03-31 ENCOUNTER — Inpatient Hospital Stay: Payer: Medicare Other | Admitting: Hematology

## 2018-03-31 NOTE — Telephone Encounter (Signed)
Received vm call from pt asking when his appt is.  Left message that appt was today @ 10:30 am & scheduling message sent to r/s next available.

## 2018-04-01 ENCOUNTER — Telehealth: Payer: Self-pay | Admitting: Hematology

## 2018-04-01 ENCOUNTER — Telehealth: Payer: Self-pay

## 2018-04-01 NOTE — Telephone Encounter (Signed)
Spoke with patient regarding new appointment date/time per 6/10 sch msg

## 2018-04-01 NOTE — Telephone Encounter (Signed)
Called patient to remind him his appointment is Friday 6/14 at 9:45 am, patient verbalized an understanding.

## 2018-04-03 NOTE — Progress Notes (Signed)
[place]  [phone] [phone]  Clinic Follow up Note   Patient Care Team: Velna Ochs, MD as PCP - General (Internal Medicine) Judeth Horn, MD as Consulting Physician (General Surgery) Truitt Merle, MD as Consulting Physician (Hematology) Irine Seal, MD as Attending Physician (Urology) Tania Ade, RN as Registered Nurse (Medical Oncology)   Date of Service:  04/04/2018  CHIEF COMPLAINTS:  Follow up stage III colon cancer   Oncology History   Colon adenocarcinoma   Staging form: Colon and Rectum, AJCC 7th Edition     Pathologic stage from 03/12/2015: Stage IIIB (T4a, N1c, cM0) - Signed by Truitt Merle, MD on 04/05/2015        Cancer of sigmoid colon ([place])   02/27/2015 Imaging    CT abdomen and pelvis showed an annular constricting lesion within the sigmoid colon, no significant adenopathy or distant metastasis. CT abdomen and pelvis on 03/11/2015 showed similar findings with obstruction.      03/12/2015 Initial Diagnosis    Sigmoid colon adenocarcinoma      03/12/2015 Tumor Marker    CEA normal 2.5      03/12/2015 Surgery    Sigmoid colon segmental resection by Dr. Excell Seltzer, surgical margins were negative.      03/12/2015 Pathology Results    Well differentiated invasive adenocarcinoma with associated abundant mucin, 14 lymph nodes were negative, a tumor deposit was present, surgical margins were negative. pT4N1c      04/26/2015 - 10/20/2015 Chemotherapy    Capecitabine po 2076m q12h, 2 weeks on, one week off, as adjuvant chemo       12/02/2015 Imaging    CT chest, abdomen and pelvis with contrast showed a moderately enlarged left external iliac lymph node, 1 cm, indeterminate, interval decrease size of previously described subcentimeter perirectal nodule, no other evidence of metastatic disease.      12/27/2016 Imaging    CT CAP w contrast  IMPRESSION: 1. Similar size of a left external iliac node, which is not pathologic  by size criteria. Otherwise, no evidence of metastatic disease in the chest, abdomen, or pelvis. 2. Tiny liver lesions are felt to be similar over prior exams but warrant followup attention. 3. 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 4. Esophageal air fluid level suggests dysmotility or gastroesophageal reflux.      03/24/2018 Imaging    CT CAP W Contrast 03/24/18 IMPRESSION: No evidence of recurrent or metastatic carcinoma within the chest, abdomen, or pelvis. Increased distention of urinary bladder. This could be due to urinary retention or chronic bladder outlet obstruction; clinical correlation is recommended. Stable enlarged prostate with TURP defect.       HISTORY OF PRESENTING ILLNESS:  TWaseemPeoples 77y.o. male is here because of stage III colon cancer. He was referred after his recent hospitalization.   He has been having abdominal discomfort and nausea for the past few months, along with some blood in his stool. No significant weight loss or change of his appetite. He presented to the ED 02/27/15 for abdominal pain with x-ray showing possible obstruction but CT scan negative for obstruction or ileus but it did indicate an annular constricting lesion in the sigmoid. He was seen by GI Dr OPaulita Fujitaper colonoscopy failed due to the poor prep. There is warranted. He knows what He presented to ED again on 03/12/2015. CT abd/pelvis showed annular constricting lesion within the sigmoid colon with markedly dilated colon proximal to this lesion with  air-fluid levels, compatible with distal colonic obstructing lesion concerning for malignancy as well as tiny hypodensities scattered throughout the liver, nonspecific but favor small cysts. General surgery was called and performed sigmoid colectomy with colostomy on 03/12/2015 by Dr. Hulen Skains. The patient's post-operative course was complicated only by hypokalemia which was  repeatedly replenished with IV and oral KCl. He received IV hydration and pain management and his diet was slowly advanced as tolerated. Physical and occupational therapy saw the patient and made recommendations. He was discharged to home with home health PT and ostomy care on 03/19/2015.   He has been recovering well from the surgery. He has no significant pain at the surgical site, no nausea, normal stool output in the colostomy bag. He has a mild to moderate fatigue, able to take care of himself and tolerating daily activities. He lives alone, uses walker as needed. He feels his energy level has been recovered well. He has a brother who lives for 5 blocks away, and helps him out when he needs.  CURRENT THERAPY: Surveillance  INTERIM HISTORY:   Abdulhadi returns for follow up for colon cancer. He was last seen by me 6 months ago. He presents to the clinic today by himself. He notes nothing new since last visit. He notes he still has colostomy bag. He told surgeon he wanted to wait for colostomy reversal. He is still waiting on his brother for transportation according to him.    On review of symptoms, pt denies pain or breathing issues. His appetite is adequate, weight is stable. He notes occasional abdominal protruding from ostomy.     MEDICAL HISTORY:  Past Medical History:  Diagnosis Date  . 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 [name] (benign prostatic hyperplasia)    hyposensitive bladder, nodular prostate with increased PSA (10.23 in 03/2006), s/p prostate biopsy 2006 that was normal (Dr Jeffie Pollock) he recommended TURP.  . Cancer ([place])   . Chronic knee pain    left  . Epididymitis    right  . Frozen shoulder    left frozen shoulder  . Gingivitis   . Hyperlipidemia   . Hypertension   . Right shoulder pain    sx in 06/2005  . Shingles   . Sinus bradycardia     SURGICAL  HISTORY: Past Surgical History:  Procedure Laterality Date  . COLOSTOMY N/A 03/12/2015   Procedure: COLOSTOMY;  Surgeon: Judeth Horn, MD;  Location: [facility];  Service: General;  Laterality: N/A;  . COLOSTOMY REVISION N/A 03/12/2015   Procedure:  RESECTION SIGMOID COLON;  Surgeon: Judeth Horn, MD;  Location: [facility];  Service: General;  Laterality: N/A;  . hydrocelectomy  11/14/2010   left hydrocelectomy  . KNEE SURGERY     L knee  . LAPAROTOMY N/A 03/12/2015   Procedure: EXPLORATORY LAPAROTOMY;  Surgeon: Judeth Horn, MD;  Location: [facility];  Service: General;  Laterality: N/A;  . right shoulder arthroscopic surgery  06/2005  . TRANSURETHRAL RESECTION OF PROSTATE     status post TURP 2/2 [name] in 2008  . VASECTOMY      SOCIAL HISTORY: Social History   Socioeconomic History  . Marital status: Single    Spouse name: Not on file  . Number of children: Not on file  . Years of education: 44  . Highest education level: Not on file  Occupational History    Employer: RETIRED  Comment: retired  Scientific laboratory technician  . Financial resource strain: Not on file  . Food insecurity:    Worry: Not on file    Inability: Not on file  . Transportation needs:    Medical: Not on file    Non-medical: Not on file  Tobacco Use  . Smoking status: Former Smoker    Packs/day: 0.50    Years: 25.00    Pack years: 12.50    Types: Cigarettes    Last attempt to quit: 02/07/1988    Years since quitting: 30.1  . Smokeless tobacco: Never Used  Substance and Sexual Activity  . Alcohol use: Yes    Alcohol/week: 0.0 oz    Comment: occasional   . Drug use: No  . Sexual activity: Not on file  Lifestyle  . Physical activity:    Days per week: Not on file    Minutes per session: Not on file  . Stress: Not on file  Relationships  . Social connections:    Talks on phone: Not on file    Gets together: Not on file    Attends religious service: Not on file    Active member of club or organization: Not on file     Attends meetings of clubs or organizations: Not on file    Relationship status: Not on file  . Intimate partner violence:    Fear of current or ex partner: Not on file    Emotionally abused: Not on file    Physically abused: Not on file    Forced sexual activity: Not on file  Other Topics Concern  . Not on file  Social History Narrative   Single, lives alone in apartment   No children   Several local siblings in area-will take him to grocery store, etc   Rides bus (75cents/trip) and bus is right outside his house   Prior employment [employer]ycle [employer]   Reports strong faith helps him cope with life   Has had to self-cath twice daily since 2008 -Dr. Jeffie Pollock   [facility] for ostomy care and supplies   Ambulates w/walker-independent in [employer]-cooks pot pies and frozen dinners    FAMILY HISTORY: Family History  Problem Relation Age of Onset  . Cancer Father        unknown type cancer     ALLERGIES:  is allergic to ciprofloxacin hcl and ibuprofen.  MEDICATIONS:  Current Outpatient Medications  Medication Sig Dispense Refill  . amLODipine (NORVASC) 5 MG tablet TAKE 1 TABLET (5 MG TOTAL) DAILY BY MOUTH. 30 tablet 2  . cetirizine (ZYRTEC) 10 MG tablet Take 10 mg by mouth at bedtime.    . Chlorpheniramine-APAP (CORICIDIN) 2-325 MG TABS Take 1 tablet every 6 (six) hours as needed by mouth. 56 each 0  . pravastatin (PRAVACHOL) 20 MG tablet Take 1 tablet (20 mg total) daily by mouth. 90 tablet 4  . fluticasone (FLONASE) 50 MCG/ACT nasal spray Place 2 sprays daily into both nostrils. (Patient not taking: Reported on 04/04/2018) 16 g 1   No current facility-administered medications for this visit.     REVIEW OF SYSTEMS:    Constitutional: Denies fevers, chills or abnormal night sweats  Eyes: Denies blurriness of vision, double vision or watery eyes Ears, nose, mouth, throat, and face: Denies mucositis or sore throat   Respiratory: Denies cough, dyspnea or wheezes Cardiovascular: Denies  palpitation, chest discomfort or lower extremity swelling Gastrointestinal:  Denies nausea, heartburn or change in bowel habits (+) occasional protruding from  ostomy Skin: see HPI  Lymphatics: Denies new lymphadenopathy or easy bruising Neurological:Denies numbness, tingling or new weaknesses Behavioral/Psych: Mood is stable, no new changes  All other systems were reviewed with the patient and are negative.   PHYSICAL EXAMINATION: ECOG PERFORMANCE STATUS: 1 - Symptomatic but completely ambulatory  Vitals:   04/04/18 0958  BP: 133/75  Pulse: (!) 57  Resp: 17  Temp: 97.7 F (36.5 C)  SpO2: 96%   Filed Weights   04/04/18 0958  Weight: 203 lb 6.4 oz (92.3 kg)    GENERAL:alert, no distress and comfortable SKIN: skin color, texture, turgor are normal, significant skin pigmentation, thickening, and mild skin peeling on the palms and feet EYES: normal, conjunctiva are pink and non-injected, sclera clear OROPHARYNX:no exudate, no erythema and lips, buccal mucosa, and tongue normal  NECK: supple, thyroid normal size, non-tender, without nodularity LYMPH:  no palpable lymphadenopathy in the cervical, axillary or inguinal LUNGS: clear to auscultation and percussion with normal breathing effort HEART: regular rate & rhythm and no murmurs and no lower extremity edema ABDOMEN:abdomen soft, non-tender and normal bowel sounds, surgical incision is well healed, (+) colostomy bag in the left abdomen, slightly protruding  Musculoskeletal:no cyanosis of digits and no clubbing  PSYCH: alert & oriented x 3 with fluent speech NEURO: no focal motor/sensory deficits  LABORATORY DATA:  I have reviewed the data as listed  CBC Latest Ref Rng & Units 03/24/2018 09/23/2017 03/22/2017  WBC 4.0 - 10.3 K/uL 7.9 5.5 6.2  Hemoglobin 13.0 - 17.1 g/dL 13.9 14.6 14.5  Hematocrit 38.4 - 49.9 % 42.9 45.5 44.4  Platelets 140 - 400 K/uL 215 245 215    CMP Latest Ref Rng & Units 03/24/2018 09/23/2017 03/22/2017  Glucose  70 - 140 mg/dL 89 93 86  BUN 7 - 26 mg/dL 13 11.8 13.0  Creatinine 0.70 - 1.30 mg/dL 1.10 1.2 1.0  Sodium 136 - 145 mmol/L 140 141 141  Potassium 3.5 - 5.1 mmol/L 4.6 5.0 3.9  Chloride 98 - 109 mmol/L 104 - -  CO2 22 - 29 mmol/L _0 Calcium 8.4 - 10.4 mg/dL 9.2 9.1 8.7  Total Protein 6.4 - 8.3 g/dL 7.8 7.9 7.2  Total Bilirubin 0.2 - 1.2 mg/dL 0.4 0.60 0.56  Alkaline Phos 40 - 150 U/L 95 93 86  AST 5 - 34 U/L _1 ALT 0 - 55 U/L _2 Results for SHADMAN, TOZZI (MRN [id number]) as of 04/04/2018 10:20  Ref. Range 08/17/2016 09:28 03/22/2017 12:42 09/23/2017 10:10 03/24/2018 10:40  CEA Latest Ref Range: 0.0 - 4.7 ng/mL 2.4 2.5    CEA ([facility]) Latest Ref Range: 0.00 - 5.00 ng/mL 1.46 1.37 1.85 1.82      PATHOLOGY REPORT  Diagnosis 03/12/2015 Colon, segmental resection for tumor, sigmoid colon - INVASIVE WELL-DIFFERENTIATED ADENOCARCINOMA WITH ASSOCIATED ABUNDANT MUCIN, SPANNING 7 CM IN GREATEST DIMENSION. 1 of 4 - TUMOR INVADES THROUGH MUSCULARIS PROPRIA THROUGH PERICOLORECTAL SOFT TISSUES TO INVOLVE SEROSAL SURFACE. - MARGINS ARE NEGATIVE. - FOURTEEN BENIGN LYMPH NODES WITH NO TUMOR SEEN (0/14). - SINGLE SOFT TISSUE SATELLITE  Microscopic Comment COLON AND RECTUM: Specimen: Sigmoid colon. Procedure: Resection of sigmoid colon. Tumor site: Sigmoid colon, distal aspect of specimen. Specimen integrity: Intact.  Macroscopic tumor perforation: Not identified. Invasive tumor: Maximum size: 7 cm. Histologic type(s): Invasive adenocarcinoma with abundant extracellular mucin. Histologic grade and differentiation: G1: well differentiated/low grade Type of polyp in which invasive carcinoma arose: Precursor polyp is not  identified. Microscopic extension of invasive tumor: Tumor invades through muscularis propria through pericolorectal soft tissues to involve serosal surface. Lymph-Vascular invasion: Definitive lymph/vascular invasion is not identified; however, a  tumor deposit is present, see below. Peri-neural invasion: Not identified. Tumor deposit(s) (discontinuous extramural extension): Yes, tumor deposit present. Resection margins: Proximal margin: Negative. Distal margin: Negative. Mesenteric margin (sigmoid and transverse): Negative. Distance closest margin (if all above margins negative): 3.5 cm (mesenteric margin). Treatment effect (neo-adjuvant therapy): Not applicable. Additional polyp(s): No additional polyps identified. Non-neoplastic findings: No significant non-neoplastic findings. Lymph nodes: number examined 14; number positive: 0. Pathologic Staging: pT4a, pN1c. Ancillary studies: Per colorectal cancer protocol, MMR by IHC and MSI by PCR will be performed on the current tumor. (RH:ecj 03/16/2015)  ADDITIONAL INFORMATION: Mismatch Repair (MMR) Protein Immunohistochemistry (IHC) IHC Expression Result: MLH1: Preserved nuclear expression (greater 50% tumor expression) MSH2: Preserved nuclear expression (greater 50% tumor expression) MSH6: Preserved nuclear expression (greater 50% tumor expression) PMS2: Preserved nuclear expression (greater 50% tumor expression) * Internal control demonstrates intact nuclear expression Interpretation: NORMAL   RADIOGRAPHIC STUDIES: I have personally reviewed the radiological images as listed and agreed with the findings in the report.   CT CAP W Contrast 03/24/18 IMPRESSION: No evidence of recurrent or metastatic carcinoma within the chest, abdomen, or pelvis. Increased distention of urinary bladder. This could be due to urinary retention or chronic bladder outlet obstruction; clinical correlation is recommended. Stable enlarged prostate with TURP defect.   CT CAP w contrast 12/27/2016 IMPRESSION: 1. Similar size of a left external iliac node, which is not pathologic by size criteria. Otherwise, no evidence of metastatic disease in the chest, abdomen, or pelvis. 2. Tiny liver lesions  are felt to be similar over prior exams but warrant followup attention. 3. 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 4. Esophageal air fluid level suggests dysmotility or gastroesophageal reflux.  Ct Abdomen Pelvis W Contrast 05/11/2016 IMPRESSION: 1. Slight interval decrease in left pelvic sidewall lymph nodes seen previously. Continued attention on follow-up recommended. 2. Mildly increased conspicuity of the tiny low-density hepatic dome lesion. Attention on follow-up recommended. 3. Otherwise no findings to suggest definite metastatic disease in the abdomen or pelvis. 4. Bladder distention, stable.  ASSESSMENT & PLAN: 77 y.o.  African-American male  1. Sigmoid colon cancer, pT4N1cM0, stage IIIB, grade 1, MMR normal -I previously reviewed his initial CT abdomen and pelvis findings, and his surgical pathology results in great details with the patient. -Giving the advanced stage IIIB disease, he does have moderate to high risk of cancer recurrence after surgery. -He has completed adjuvant Xeloda, tolerated very well.  -I previously reviewed his restaging CT abdomen and pelvis from 05/11/2016, no definitive evidence of cancer recurrence. Left external iliac lymph node has decreased in size, the tiny lesion in the liver performed has been overall stable over a year, likely benign. -I reviewed his restaging CT chest, abdomen and pelvis from 12/27/2016, which showed no evidence of recurrence, stable tiny liver lesion and left iliac lymph node, likely benign -I reviewed his CT CAP from 03/24/18 which shows no evidence of recurrent or metastatic disease. Stable enlarged prostate.  -I again discussed going for followup colonoscopy and reversing his colostomy as it has been 3 years since his surgery.  He still has not gone back to see Dr. Hulen Skains for that  -Given he is 3 out from diagnosis I will no longer scan him, but will proceed  surveillance with labs  -He is  currently doing well, exam was unremarkable, lab reviewed, tumor marker CEA is normal.  No clinical concern for recurrence. -I'll see him back in 6 months    2.  Atony of bladder -He does straight catch twice a day, and on chronic Bactrim to prevent UTI. -He'll continue follow-up with his urologist.  3. Hypertension, arthritis -He will continue follow-up with his primary care physician     Plan -Continue surveillance.  F/u in 6 months with labs  -    All questions were answered. The patient knows to call the clinic with any problems, questions or concerns. I spent 20 minutes counseling the patient face to face. The total time spent in the appointment was 25 minutes and more than 50% was on counseling.  Oneal Deputy, am acting as scribe for Truitt Merle, MD.   I have reviewed the above documentation for accuracy and completeness, and I agree with the above.     Truitt Merle, MD 04/04/2018

## 2018-04-04 ENCOUNTER — Inpatient Hospital Stay (HOSPITAL_BASED_OUTPATIENT_CLINIC_OR_DEPARTMENT_OTHER): Payer: Medicare Other | Admitting: Hematology

## 2018-04-04 ENCOUNTER — Telehealth: Payer: Self-pay | Admitting: Hematology

## 2018-04-04 ENCOUNTER — Encounter: Payer: Self-pay | Admitting: Hematology

## 2018-04-04 VITALS — BP 133/75 | HR 57 | Temp 97.7°F | Resp 17 | Ht 71.0 in | Wt 203.4 lb

## 2018-04-04 DIAGNOSIS — N400 Enlarged prostate without lower urinary tract symptoms: Secondary | ICD-10-CM | POA: Diagnosis not present

## 2018-04-04 DIAGNOSIS — C187 Malignant neoplasm of sigmoid colon: Secondary | ICD-10-CM | POA: Diagnosis not present

## 2018-04-04 DIAGNOSIS — N312 Flaccid neuropathic bladder, not elsewhere classified: Secondary | ICD-10-CM

## 2018-04-04 DIAGNOSIS — I10 Essential (primary) hypertension: Secondary | ICD-10-CM | POA: Diagnosis not present

## 2018-04-04 DIAGNOSIS — Z933 Colostomy status: Secondary | ICD-10-CM

## 2018-04-04 DIAGNOSIS — Z9221 Personal history of antineoplastic chemotherapy: Secondary | ICD-10-CM

## 2018-04-04 DIAGNOSIS — R011 Cardiac murmur, unspecified: Secondary | ICD-10-CM | POA: Diagnosis not present

## 2018-04-04 DIAGNOSIS — G8929 Other chronic pain: Secondary | ICD-10-CM | POA: Diagnosis not present

## 2018-04-04 DIAGNOSIS — K435 Parastomal hernia without obstruction or  gangrene: Secondary | ICD-10-CM | POA: Diagnosis not present

## 2018-04-04 DIAGNOSIS — K769 Liver disease, unspecified: Secondary | ICD-10-CM

## 2018-04-04 DIAGNOSIS — M25569 Pain in unspecified knee: Secondary | ICD-10-CM | POA: Diagnosis not present

## 2018-04-04 DIAGNOSIS — Z87891 Personal history of nicotine dependence: Secondary | ICD-10-CM

## 2018-04-04 DIAGNOSIS — K219 Gastro-esophageal reflux disease without esophagitis: Secondary | ICD-10-CM | POA: Diagnosis not present

## 2018-04-04 DIAGNOSIS — R001 Bradycardia, unspecified: Secondary | ICD-10-CM | POA: Diagnosis not present

## 2018-04-04 DIAGNOSIS — E785 Hyperlipidemia, unspecified: Secondary | ICD-10-CM

## 2018-04-04 DIAGNOSIS — Z79899 Other long term (current) drug therapy: Secondary | ICD-10-CM | POA: Diagnosis not present

## 2018-04-04 NOTE — Telephone Encounter (Signed)
Scheduled apt per 6/14 los - Gave patient aVS and calender per los.

## 2018-04-14 ENCOUNTER — Encounter: Payer: Medicare Other | Admitting: Internal Medicine

## 2018-04-17 DIAGNOSIS — Z933 Colostomy status: Secondary | ICD-10-CM | POA: Diagnosis not present

## 2018-05-06 DIAGNOSIS — R338 Other retention of urine: Secondary | ICD-10-CM | POA: Diagnosis not present

## 2018-05-22 ENCOUNTER — Encounter: Payer: Self-pay | Admitting: *Deleted

## 2018-06-09 ENCOUNTER — Encounter: Payer: Medicare Other | Admitting: Internal Medicine

## 2018-06-23 ENCOUNTER — Other Ambulatory Visit: Payer: Self-pay | Admitting: Internal Medicine

## 2018-06-23 DIAGNOSIS — I10 Essential (primary) hypertension: Secondary | ICD-10-CM

## 2018-06-24 NOTE — Telephone Encounter (Signed)
Next appt scheduled 10/28 with PCP.

## 2018-07-04 DIAGNOSIS — R338 Other retention of urine: Secondary | ICD-10-CM | POA: Diagnosis not present

## 2018-08-18 ENCOUNTER — Encounter: Payer: Medicare Other | Admitting: Internal Medicine

## 2018-08-21 DIAGNOSIS — R338 Other retention of urine: Secondary | ICD-10-CM | POA: Diagnosis not present

## 2018-09-21 ENCOUNTER — Other Ambulatory Visit: Payer: Self-pay | Admitting: Internal Medicine

## 2018-09-21 DIAGNOSIS — I10 Essential (primary) hypertension: Secondary | ICD-10-CM

## 2018-09-22 NOTE — Telephone Encounter (Signed)
Approved 30 day supply of amlodipine. No showed his last appointment. Has not been seen in over 1 year. Please have him follow up with me or in [facility] this month. Thank you.

## 2018-09-25 ENCOUNTER — Other Ambulatory Visit: Payer: Self-pay | Admitting: *Deleted

## 2018-09-26 ENCOUNTER — Other Ambulatory Visit: Payer: Self-pay | Admitting: Internal Medicine

## 2018-09-26 MED ORDER — PRAVASTATIN SODIUM 20 MG PO TABS: 20.0000 mg | ORAL_TABLET | Freq: Every day | ORAL | 0 refills | Status: DC

## 2018-09-29 ENCOUNTER — Ambulatory Visit (INDEPENDENT_AMBULATORY_CARE_PROVIDER_SITE_OTHER): Payer: Medicare Other | Admitting: Internal Medicine

## 2018-09-29 ENCOUNTER — Encounter: Payer: Self-pay | Admitting: Internal Medicine

## 2018-09-29 VITALS — BP 126/68 | HR 65 | Temp 98.2°F | Wt 200.6 lb

## 2018-09-29 DIAGNOSIS — Z79899 Other long term (current) drug therapy: Secondary | ICD-10-CM

## 2018-09-29 DIAGNOSIS — J309 Allergic rhinitis, unspecified: Secondary | ICD-10-CM

## 2018-09-29 DIAGNOSIS — Z9049 Acquired absence of other specified parts of digestive tract: Secondary | ICD-10-CM

## 2018-09-29 DIAGNOSIS — Z85030 Personal history of malignant carcinoid tumor of large intestine: Secondary | ICD-10-CM

## 2018-09-29 DIAGNOSIS — Z23 Encounter for immunization: Secondary | ICD-10-CM | POA: Diagnosis not present

## 2018-09-29 DIAGNOSIS — E785 Hyperlipidemia, unspecified: Secondary | ICD-10-CM | POA: Diagnosis not present

## 2018-09-29 DIAGNOSIS — N312 Flaccid neuropathic bladder, not elsewhere classified: Secondary | ICD-10-CM | POA: Diagnosis not present

## 2018-09-29 DIAGNOSIS — I10 Essential (primary) hypertension: Secondary | ICD-10-CM | POA: Diagnosis not present

## 2018-09-29 DIAGNOSIS — C187 Malignant neoplasm of sigmoid colon: Secondary | ICD-10-CM

## 2018-09-29 DIAGNOSIS — Z933 Colostomy status: Secondary | ICD-10-CM

## 2018-09-29 MED ORDER — AMLODIPINE BESYLATE 5 MG PO TABS: 5.0000 mg | ORAL_TABLET | Freq: Every day | ORAL | 1 refills | Status: DC

## 2018-09-29 MED ORDER — FLUTICASONE PROPIONATE 50 MCG/ACT NA SUSP: 2.0000 | Freq: Every day | NASAL | 2 refills | Status: DC

## 2018-09-29 MED ORDER — CETIRIZINE HCL 10 MG PO TABS: 10.0000 mg | ORAL_TABLET | Freq: Every day | ORAL | 5 refills | Status: AC

## 2018-09-29 NOTE — Patient Instructions (Signed)
Mr. Skirvin,  It was a pleasure to see you. I am glad to hear you are doing well. For your sinuses, please use Flonase (nasal spray), 2 sprays each nostril daily. Continue to take your zyrtec.   I am checking your cholesterol levels today. I will call you with the results.   Follow up with me again in 6 months or sooner if you have any issues. If you have any questions or concerns, call our clinic at [phone] or after hours call [phone] and ask for the internal medicine resident on call. Thank you!  Dr. Philipp Ovens

## 2018-09-29 NOTE — Assessment & Plan Note (Addendum)
Currently on pravastatin 20 mg. Last lipid panel in 04/2017 with LDL of 80.  -- F/u repeat lipid panel

## 2018-09-29 NOTE — Progress Notes (Signed)
   CC: HTN follow up  HPI:  [name] is a 77 y.o. male with past medical history outlined below here for HTN follow up. For the details of today's visit, please refer to the assessment and plan.  Past Medical History:  Diagnosis Date  . 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 [name] (benign prostatic hyperplasia)    hyposensitive bladder, nodular prostate with increased PSA (10.23 in 03/2006), s/p prostate biopsy 2006 that was normal (Dr Jeffie Pollock) he recommended TURP.  . Cancer ([place])   . Chronic knee pain    left  . Epididymitis    right  . Frozen shoulder    left frozen shoulder  . Gingivitis   . Hyperlipidemia   . Hypertension   . Right shoulder pain    sx in 06/2005  . Shingles   . Sinus bradycardia     Review of Systems  Constitutional: Negative for chills and fever.    Physical Exam:  Vitals:   09/29/18 1417  BP: 126/68  Pulse: 65  Temp: 98.2 F (36.8 C)  TempSrc: Oral  SpO2: 100%  Weight: 200 lb 9.6 oz (91 kg)    Constitutional: NAD, appears comfortable Cardiovascular: RRR, no murmurs, rubs, or gallops.  Pulmonary/Chest: CTAB, no wheezes, rales, or rhonchi.  Abdominal: Colostomy bag in place Extremities: Warm and well perfused. Mild non pitting edema   Psychiatric: Normal mood and affect  Assessment & Plan:   See Encounters Tab for problem based charting.  Patient discussed with Dr. Rebeca Alert

## 2018-09-29 NOTE — Assessment & Plan Note (Addendum)
Patient has a history of adenocarcinoma of his sigmoid colon s/p colectomy with colostomy bag in place. He follows with oncology for surveillance. Plan was for colostomy take down, but patient declined. He states he has not had any issues with the colostomy bag and wishes to avoid further procedures for now. Will follow.

## 2018-09-29 NOTE — Assessment & Plan Note (Signed)
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 He takes bactrim once daily for prophylaxis. Follows with [facility].  -- Records requested

## 2018-09-29 NOTE — Assessment & Plan Note (Signed)
Well controlled on amlodipine 5 mg daily. BP today 126/68.  -- Continue current regimen

## 2018-09-29 NOTE — Assessment & Plan Note (Signed)
Patient has chronic allergic rhinitis previously controlled on zyrtec and flonase. He ran out of his flonase and subsequently developed worsening congestion and rhinorrhea.  -- Restart flonase daily  -- Continue zyrtec

## 2018-09-30 LAB — LIPID PANEL
Chol/HDL Ratio: 1.9 ratio (ref 0.0–5.0)
Cholesterol, Total: 145 mg/dL (ref 100–199)
HDL: 77 mg/dL (ref >39)
LDL Calculated: 62 mg/dL (ref 0–99)
Triglycerides: 30 mg/dL (ref 0–149)
VLDL Cholesterol Cal: 6 mg/dL (ref 5–40)

## 2018-09-30 NOTE — Telephone Encounter (Signed)
Appt was made and pt was seen by PCP on 09/29/18. SChaplin, RN,BSN

## 2018-09-30 NOTE — Progress Notes (Signed)
Internal Medicine Clinic Attending  Case discussed with Dr. Guilloud at the time of the visit.  We reviewed the resident's history and exam and pertinent patient test results.  I agree with the assessment, diagnosis, and plan of care documented in the resident's note.  Alexander Raines, M.D., Ph.D.  

## 2018-10-03 ENCOUNTER — Inpatient Hospital Stay: Payer: Medicare Other | Admitting: Hematology

## 2018-10-03 ENCOUNTER — Inpatient Hospital Stay: Payer: Medicare Other

## 2018-10-07 DIAGNOSIS — K9409 Other complications of colostomy: Secondary | ICD-10-CM | POA: Diagnosis not present

## 2018-10-07 DIAGNOSIS — R338 Other retention of urine: Secondary | ICD-10-CM | POA: Diagnosis not present

## 2018-11-03 NOTE — Progress Notes (Signed)
[name]   [phone] [phone]   Clinic Follow up Note   Patient Care Team: Velna Ochs, MD as PCP - General (Internal Medicine) Judeth Horn, MD as Consulting Physician (General Surgery) Truitt Merle, MD as Consulting Physician (Hematology) Irine Seal, MD as Attending Physician (Urology) Tania Ade, RN as Registered Nurse (Medical Oncology)  Date of Service:  11/05/2018  CHIEF COMPLAINT: Follow up stage III colon cancer   SUMMARY OF ONCOLOGIC HISTORY: Oncology History   Colon adenocarcinoma   Staging form: Colon and Rectum, AJCC 7th Edition     Pathologic stage from 03/12/2015: Stage IIIB (T4a, N1c, cM0) - Signed by Truitt Merle, MD on 04/05/2015        Cancer of sigmoid colon ([place])   02/27/2015 Imaging    CT abdomen and pelvis showed an annular constricting lesion within the sigmoid colon, no significant adenopathy or distant metastasis. CT abdomen and pelvis on 03/11/2015 showed similar findings with obstruction.    03/12/2015 Initial Diagnosis    Sigmoid colon adenocarcinoma    03/12/2015 Tumor Marker    CEA normal 2.5    03/12/2015 Surgery    Sigmoid colon segmental resection by Dr. Excell Seltzer, surgical margins were negative.    03/12/2015 Pathology Results    Well differentiated invasive adenocarcinoma with associated abundant mucin, 14 lymph nodes were negative, a tumor deposit was present, surgical margins were negative. pT4N1c    04/26/2015 - 10/20/2015 Chemotherapy    Capecitabine po '2000mg'$  q12h, 2 weeks on, one week off, as adjuvant chemo     12/02/2015 Imaging    CT chest, abdomen and pelvis with contrast showed a moderately enlarged left external iliac lymph node, 1 cm, indeterminate, interval decrease size of previously described subcentimeter perirectal nodule, no other evidence of metastatic disease.    12/27/2016 Imaging    CT CAP w contrast  IMPRESSION: 1. Similar size of a left external iliac node, which is  not pathologic by size criteria. Otherwise, no evidence of metastatic disease in the chest, abdomen, or pelvis. 2. Tiny liver lesions are felt to be similar over prior exams but warrant followup attention. 3. 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 4. Esophageal air fluid level suggests dysmotility or gastroesophageal reflux.    03/24/2018 Imaging    CT CAP W Contrast 03/24/18 IMPRESSION: No evidence of recurrent or metastatic carcinoma within the chest, abdomen, or pelvis. Increased distention of urinary bladder. This could be due to urinary retention or chronic bladder outlet obstruction; clinical correlation is recommended. Stable enlarged prostate with TURP defect.      CURRENT THERAPY:  Surveillance   INTERVAL HISTORY:  Greg [name] is here for a follow up of colon cancer. He was last seen by me in 03/2018. He presents to the clinic today by himself. He notes he is doing well. He did not abdominal protrusion. He will follow up with colon surgeon this month. He is not sure if this is a hernia. This is not causing him any pain. He notes he has not been able to get colostomy reversal due to not having anyone to be there with him.  He notes having b/l LE swelling which comes and goes. He does elevate his feet sometimes but not constantly.      REVIEW OF SYSTEMS:   Constitutional: Denies fevers, chills or abnormal weight loss Eyes: Denies blurriness of vision Ears, nose, mouth, throat, and face: Denies mucositis or sore throat Respiratory: Denies  cough, dyspnea or wheezes Cardiovascular: Denies palpitation, chest discomfort (+) mild b/l lower extremity swelling Gastrointestinal:  Denies nausea, heartburn or change in bowel habits (+) abdominal protrusion behind colostomy bag.  Skin: Denies abnormal skin rashes Lymphatics: Denies new lymphadenopathy or easy bruising Neurological:Denies numbness, tingling or  new weaknesses Behavioral/Psych: Mood is stable, no new changes  All other systems were reviewed with the patient and are negative.  MEDICAL HISTORY:  Past Medical History:  Diagnosis Date  . 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 [name] (benign prostatic hyperplasia)    hyposensitive bladder, nodular prostate with increased PSA (10.23 in 03/2006), s/p prostate biopsy 2006 that was normal (Dr Jeffie Pollock) he recommended TURP.  . Cancer ([place])   . Chronic knee pain    left  . Epididymitis    right  . Frozen shoulder    left frozen shoulder  . Gingivitis   . Hyperlipidemia   . Hypertension   . Right shoulder pain    sx in 06/2005  . Shingles   . Sinus bradycardia     SURGICAL HISTORY: Past Surgical History:  Procedure Laterality Date  . COLOSTOMY N/A 03/12/2015   Procedure: COLOSTOMY;  Surgeon: Judeth Horn, MD;  Location: [place];  Service: General;  Laterality: N/A;  . COLOSTOMY REVISION N/A 03/12/2015   Procedure:  RESECTION SIGMOID COLON;  Surgeon: Judeth Horn, MD;  Location: [place];  Service: General;  Laterality: N/A;  . hydrocelectomy  11/14/2010   left hydrocelectomy  . KNEE SURGERY     L knee  . LAPAROTOMY N/A 03/12/2015   Procedure: EXPLORATORY LAPAROTOMY;  Surgeon: Judeth Horn, MD;  Location: [place];  Service: General;  Laterality: N/A;  . right shoulder arthroscopic surgery  06/2005  . TRANSURETHRAL RESECTION OF PROSTATE     status post TURP 2/2 [name] in 2008  . VASECTOMY      I have reviewed the social history and family history with the patient and they are unchanged from previous note.  ALLERGIES:  is allergic to ciprofloxacin hcl and ibuprofen.  MEDICATIONS:  Current Outpatient Medications  Medication Sig Dispense Refill  . amLODipine (NORVASC) 5 MG tablet Take 1 tablet (5 mg total) by mouth daily. 90 tablet 1  . cetirizine (ZYRTEC) 10 MG tablet Take 1  tablet (10 mg total) by mouth at bedtime. 30 tablet 5  . Chlorpheniramine-APAP (CORICIDIN) 2-325 MG TABS Take 1 tablet every 6 (six) hours as needed by mouth. 56 each 0  . fluticasone (FLONASE) 50 MCG/ACT nasal spray Place 2 sprays into both nostrils daily. 16 g 2  . pravastatin (PRAVACHOL) 20 MG tablet Take 1 tablet (20 mg total) by mouth daily. 90 tablet 0  . sulfamethoxazole-trimethoprim (BACTRIM DS,SEPTRA DS) 800-160 MG tablet Take 1 tablet by mouth daily.     No current facility-administered medications for this visit.     PHYSICAL EXAMINATION: ECOG PERFORMANCE STATUS: 1 - Symptomatic but completely ambulatory  Vitals:   11/05/18 1100  BP: 127/79  Pulse: (!) 55  Resp: 18  Temp: 97.7 F (36.5 C)  SpO2: 98%   Filed Weights   11/05/18 1100  Weight: 201 lb 9.6 oz (91.4 kg)    GENERAL:alert, no distress and comfortable SKIN: skin color, texture, turgor are normal, no rashes or significant lesions EYES: normal, Conjunctiva are pink and non-injected, sclera clear OROPHARYNX:no exudate, no erythema and lips, buccal mucosa, and tongue  normal  NECK: supple, thyroid normal size, non-tender, without nodularity LYMPH:  no palpable lymphadenopathy in the cervical, axillary or inguinal LUNGS: clear to auscultation and percussion with normal breathing effort HEART: regular rate & rhythm and no murmurs (+) b/l mild lower extremity edema ABDOMEN:abdomen soft, non-tender and normal bowel sounds (+) Colostomy bag in place  Musculoskeletal:no cyanosis of digits and no clubbing  NEURO: alert & oriented x 3 with fluent speech, no focal motor/sensory deficits  LABORATORY DATA:  I have reviewed the data as listed CBC Latest Ref Rng & Units 11/05/2018 03/24/2018 09/23/2017  WBC 4.0 - 10.5 K/uL 5.9 7.9 5.5  Hemoglobin 13.0 - 17.0 g/dL 13.6 13.9 14.6  Hematocrit 39.0 - 52.0 % 41.9 42.9 45.5  Platelets 150 - 400 K/uL 224 215 245     CMP Latest Ref Rng & Units 11/05/2018 03/24/2018 09/23/2017   Glucose 70 - 99 mg/dL 99 89 93  BUN 8 - 23 mg/dL 18 13 11.8  Creatinine 0.61 - 1.24 mg/dL 1.30(H) 1.10 1.2  Sodium 135 - 145 mmol/L 140 140 141  Potassium 3.5 - 5.1 mmol/L 4.5 4.6 5.0  Chloride 98 - 111 mmol/L 106 104 -  CO2 22 - 32 mmol/L '27 28 28  '$ Calcium 8.9 - 10.3 mg/dL 8.6(L) 9.2 9.1  Total Protein 6.5 - 8.1 g/dL 7.3 7.8 7.9  Total Bilirubin 0.3 - 1.2 mg/dL 0.4 0.4 0.60  Alkaline Phos 38 - 126 U/L 87 95 93  AST 15 - 41 U/L 14(L) 18 22  ALT 0 - 44 U/L '9 9 17      '$ RADIOGRAPHIC STUDIES: I have personally reviewed the radiological images as listed and agreed with the findings in the report. No results found.   ASSESSMENT & PLAN:  Greg [name] is a 78 y.o. male with   1. Sigmoid colon cancer, pT4N1cM0, stage IIIB, grade 1, MMR normal -He was diagnosed in 02/2015. He is s/p sigmoid colon resection and adjuvant Xeloda.  -His subsequent scans since 04/2016 have been NED.  -He still has colostomy bag in place. Has not proceeded with reversal surgery due to not having anyone to go with him for pre-op tests. I recommend he proceed with surgery soon.  -He is clinically doing well. Labs reviewed, CBC and CMP is WNL, except Cr at 1.30 and Ca at 8.6. I strongly encouraged him to drink plenty of water. His physical exam was unremarkable.  -I will likely not repeat CT scans unless there are concerning sign of recurrence.  -He is 3.5 years since diagnosis. His risk of cancer recurrence is significantly lower. Will continue surveillance. I do not plan to repeat routine surveillance scans. -F/u in 6 monthly then yearly.   2.  Atony of bladder  -He'll continue follow-up with his urologist.  3. Hypertension, arthritis -He will continue follow-up with his primary care physician     Plan -Continue Surveillance  -Lab and f/u in 6 months    No problem-specific Assessment & Plan notes found for this encounter.   No orders of the defined types were placed in this encounter.  All  questions were answered. The patient knows to call the clinic with any problems, questions or concerns. No barriers to learning was detected. I spent 15 minutes counseling the patient face to face. The total time spent in the appointment was 20 minutes and more than 50% was on counseling and review of test results     Truitt Merle, MD 11/05/2018   I, Joslyn Devon, am  acting as scribe for Truitt Merle, MD.   I have reviewed the above documentation for accuracy and completeness, and I agree with the above.

## 2018-11-05 ENCOUNTER — Inpatient Hospital Stay (HOSPITAL_BASED_OUTPATIENT_CLINIC_OR_DEPARTMENT_OTHER): Payer: Medicare Other | Admitting: Hematology

## 2018-11-05 ENCOUNTER — Inpatient Hospital Stay: Payer: Medicare Other | Attending: Hematology

## 2018-11-05 ENCOUNTER — Telehealth: Payer: Self-pay | Admitting: Hematology

## 2018-11-05 ENCOUNTER — Encounter: Payer: Self-pay | Admitting: Hematology

## 2018-11-05 VITALS — BP 127/79 | HR 55 | Temp 97.7°F | Resp 18 | Ht 71.0 in | Wt 201.6 lb

## 2018-11-05 DIAGNOSIS — Z9221 Personal history of antineoplastic chemotherapy: Secondary | ICD-10-CM

## 2018-11-05 DIAGNOSIS — E785 Hyperlipidemia, unspecified: Secondary | ICD-10-CM | POA: Diagnosis not present

## 2018-11-05 DIAGNOSIS — N401 Enlarged prostate with lower urinary tract symptoms: Secondary | ICD-10-CM

## 2018-11-05 DIAGNOSIS — Z79899 Other long term (current) drug therapy: Secondary | ICD-10-CM | POA: Diagnosis not present

## 2018-11-05 DIAGNOSIS — M7989 Other specified soft tissue disorders: Secondary | ICD-10-CM | POA: Diagnosis not present

## 2018-11-05 DIAGNOSIS — M129 Arthropathy, unspecified: Secondary | ICD-10-CM | POA: Diagnosis not present

## 2018-11-05 DIAGNOSIS — C187 Malignant neoplasm of sigmoid colon: Secondary | ICD-10-CM | POA: Diagnosis not present

## 2018-11-05 DIAGNOSIS — M25511 Pain in right shoulder: Secondary | ICD-10-CM | POA: Diagnosis not present

## 2018-11-05 DIAGNOSIS — R001 Bradycardia, unspecified: Secondary | ICD-10-CM | POA: Diagnosis not present

## 2018-11-05 DIAGNOSIS — I10 Essential (primary) hypertension: Secondary | ICD-10-CM | POA: Diagnosis not present

## 2018-11-05 DIAGNOSIS — N312 Flaccid neuropathic bladder, not elsewhere classified: Secondary | ICD-10-CM

## 2018-11-05 DIAGNOSIS — Z933 Colostomy status: Secondary | ICD-10-CM

## 2018-11-05 LAB — CBC WITH DIFFERENTIAL/PLATELET
ABS IMMATURE GRANULOCYTES: 0.01 10*3/uL (ref 0.00–0.07)
Basophils Absolute: 0.1 10*3/uL (ref 0.0–0.1)
Basophils Relative: 1 %
Eosinophils Absolute: 0.1 10*3/uL (ref 0.0–0.5)
Eosinophils Relative: 1 %
HCT: 41.9 % (ref 39.0–52.0)
HEMOGLOBIN: 13.6 g/dL (ref 13.0–17.0)
IMMATURE GRANULOCYTES: 0 %
LYMPHS ABS: 1.3 10*3/uL (ref 0.7–4.0)
Lymphocytes Relative: 22 %
MCH: 31.6 pg (ref 26.0–34.0)
MCHC: 32.5 g/dL (ref 30.0–36.0)
MCV: 97.2 fL (ref 80.0–100.0)
MONO ABS: 0.6 10*3/uL (ref 0.1–1.0)
Monocytes Relative: 10 %
NEUTROS ABS: 3.9 10*3/uL (ref 1.7–7.7)
Neutrophils Relative %: 66 %
PLATELETS: 224 10*3/uL (ref 150–400)
RBC: 4.31 MIL/uL (ref 4.22–5.81)
RDW: 13.4 % (ref 11.5–15.5)
WBC: 5.9 10*3/uL (ref 4.0–10.5)
nRBC: 0 % (ref 0.0–0.2)

## 2018-11-05 LAB — COMPREHENSIVE METABOLIC PANEL
ALBUMIN: 3.7 g/dL (ref 3.5–5.0)
ALT: 9 U/L (ref 0–44)
AST: 14 U/L — AB (ref 15–41)
Alkaline Phosphatase: 87 U/L (ref 38–126)
Anion gap: 7 (ref 5–15)
BILIRUBIN TOTAL: 0.4 mg/dL (ref 0.3–1.2)
BUN: 18 mg/dL (ref 8–23)
CHLORIDE: 106 mmol/L (ref 98–111)
CO2: 27 mmol/L (ref 22–32)
Calcium: 8.6 mg/dL — ABNORMAL LOW (ref 8.9–10.3)
Creatinine, Ser: 1.3 mg/dL — ABNORMAL HIGH (ref 0.61–1.24)
GFR calc Af Amer: >60 mL/min (ref >60)
GFR calc non Af Amer: 52 mL/min — ABNORMAL LOW (ref >60)
GLUCOSE: 99 mg/dL (ref 70–99)
POTASSIUM: 4.5 mmol/L (ref 3.5–5.1)
SODIUM: 140 mmol/L (ref 135–145)
Total Protein: 7.3 g/dL (ref 6.5–8.1)

## 2018-11-05 LAB — CEA (IN HOUSE-CHCC): CEA (CHCC-In House): 1.62 ng/mL (ref 0.00–5.00)

## 2018-11-05 NOTE — Telephone Encounter (Signed)
Printed calendar and avs. °

## 2018-11-06 ENCOUNTER — Telehealth: Payer: Self-pay | Admitting: Hematology

## 2018-11-06 NOTE — Telephone Encounter (Signed)
No los, but per MD request, lab and FU in 6 months.

## 2018-11-11 ENCOUNTER — Telehealth: Payer: Self-pay

## 2018-11-11 NOTE — Telephone Encounter (Signed)
Left voice message for patient regarding lab results, per Dr. Burr Medico. Creatinine is slightly elevated, instructed him to drink at least 6 to 8 glasses of water daily, CEA was normal, no other concerns.  Encouraged him to call back if he has questions.

## 2018-11-11 NOTE — Telephone Encounter (Signed)
-----   Message from Truitt Merle, MD sent at 11/07/2018 10:59 PM EST ----- Please let pt know his lab result, Cr slightly elevated, make sure he drinks adequate fruids. CEA normal, no other concerns, thanks  Truitt Merle  11/07/2018

## 2018-11-13 DIAGNOSIS — N3020 Other chronic cystitis without hematuria: Secondary | ICD-10-CM | POA: Diagnosis not present

## 2018-11-13 DIAGNOSIS — R300 Dysuria: Secondary | ICD-10-CM | POA: Diagnosis not present

## 2018-11-20 DIAGNOSIS — R338 Other retention of urine: Secondary | ICD-10-CM | POA: Diagnosis not present

## 2018-12-12 ENCOUNTER — Other Ambulatory Visit: Payer: Self-pay | Admitting: Internal Medicine

## 2018-12-31 ENCOUNTER — Emergency Department (HOSPITAL_COMMUNITY)
Admission: EM | Admit: 2018-12-31 | Discharge: 2018-12-31 | Disposition: A | Discharge status: Home / Self Care | Payer: Medicare Other | Attending: Emergency Medicine | Admitting: Emergency Medicine

## 2018-12-31 ENCOUNTER — Encounter (HOSPITAL_COMMUNITY): Payer: Self-pay | Admitting: Pharmacy Technician

## 2018-12-31 ENCOUNTER — Other Ambulatory Visit: Payer: Self-pay

## 2018-12-31 DIAGNOSIS — Z79899 Other long term (current) drug therapy: Secondary | ICD-10-CM | POA: Insufficient documentation

## 2018-12-31 DIAGNOSIS — N390 Urinary tract infection, site not specified: Secondary | ICD-10-CM | POA: Insufficient documentation

## 2018-12-31 DIAGNOSIS — I10 Essential (primary) hypertension: Secondary | ICD-10-CM | POA: Insufficient documentation

## 2018-12-31 DIAGNOSIS — Z87891 Personal history of nicotine dependence: Secondary | ICD-10-CM | POA: Diagnosis not present

## 2018-12-31 DIAGNOSIS — R109 Unspecified abdominal pain: Secondary | ICD-10-CM | POA: Diagnosis present

## 2018-12-31 DIAGNOSIS — Z85038 Personal history of other malignant neoplasm of large intestine: Secondary | ICD-10-CM | POA: Diagnosis not present

## 2018-12-31 LAB — COMPREHENSIVE METABOLIC PANEL
ALT: 11 U/L (ref 0–44)
AST: 20 U/L (ref 15–41)
Albumin: 3.7 g/dL (ref 3.5–5.0)
Alkaline Phosphatase: 71 U/L (ref 38–126)
Anion gap: 8 (ref 5–15)
BUN: 14 mg/dL (ref 8–23)
CHLORIDE: 104 mmol/L (ref 98–111)
CO2: 25 mmol/L (ref 22–32)
CREATININE: 1.16 mg/dL (ref 0.61–1.24)
Calcium: 8.6 mg/dL — ABNORMAL LOW (ref 8.9–10.3)
GFR calc Af Amer: >60 mL/min (ref >60)
GFR, EST NON AFRICAN AMERICAN: 60 mL/min — AB (ref >60)
Glucose, Bld: 96 mg/dL (ref 70–99)
POTASSIUM: 3.5 mmol/L (ref 3.5–5.1)
Sodium: 137 mmol/L (ref 135–145)
Total Bilirubin: 0.8 mg/dL (ref 0.3–1.2)
Total Protein: 6.9 g/dL (ref 6.5–8.1)

## 2018-12-31 LAB — URINALYSIS, ROUTINE W REFLEX MICROSCOPIC
BILIRUBIN URINE: NEGATIVE
Glucose, UA: NEGATIVE mg/dL
Ketones, ur: 15 mg/dL — AB
Nitrite: POSITIVE — AB
Protein, ur: NEGATIVE mg/dL
SPECIFIC GRAVITY, URINE: 1.015 (ref 1.005–1.030)
pH: 7 (ref 5.0–8.0)

## 2018-12-31 LAB — CBC WITH DIFFERENTIAL/PLATELET
ABS IMMATURE GRANULOCYTES: 0.01 10*3/uL (ref 0.00–0.07)
Basophils Absolute: 0.1 10*3/uL (ref 0.0–0.1)
Basophils Relative: 1 %
EOS PCT: 1 %
Eosinophils Absolute: 0 10*3/uL (ref 0.0–0.5)
HCT: 43.3 % (ref 39.0–52.0)
Hemoglobin: 13.4 g/dL (ref 13.0–17.0)
Immature Granulocytes: 0 %
LYMPHS ABS: 1.1 10*3/uL (ref 0.7–4.0)
LYMPHS PCT: 22 %
MCH: 30.2 pg (ref 26.0–34.0)
MCHC: 30.9 g/dL (ref 30.0–36.0)
MCV: 97.7 fL (ref 80.0–100.0)
MONO ABS: 0.5 10*3/uL (ref 0.1–1.0)
Monocytes Relative: 10 %
NEUTROS ABS: 3.5 10*3/uL (ref 1.7–7.7)
Neutrophils Relative %: 66 %
PLATELETS: 241 10*3/uL (ref 150–400)
RBC: 4.43 MIL/uL (ref 4.22–5.81)
RDW: 13.1 % (ref 11.5–15.5)
WBC: 5.2 10*3/uL (ref 4.0–10.5)
nRBC: 0 % (ref 0.0–0.2)

## 2018-12-31 LAB — URINALYSIS, MICROSCOPIC (REFLEX)

## 2018-12-31 LAB — LIPASE, BLOOD: LIPASE: 24 U/L (ref 11–51)

## 2018-12-31 MED ORDER — CIPROFLOXACIN HCL 500 MG PO TABS: 500.0000 mg | ORAL_TABLET | Freq: Two times a day (BID) | ORAL | 0 refills | Status: AC

## 2018-12-31 MED ORDER — ACETAMINOPHEN 325 MG PO TABS
650.0000 mg | ORAL_TABLET | Freq: Once | ORAL | Status: AC
  Administered 2018-12-31: 650 mg via ORAL
  Filled 2018-12-31: qty 2

## 2018-12-31 NOTE — ED Triage Notes (Signed)
Pt arrives pov with R flank pain, worse with movement. Denies fevers, NVD, constipation or urinary complaints.

## 2018-12-31 NOTE — Discharge Instructions (Addendum)
You were given a prescription for antibiotics. Please take the antibiotic prescription fully.   A culture was sent of your urine today to determine if there is any bacterial growth. If the results of the culture are positive and you require an antibiotic or a change of your prescribed antibiotic you will be contacted by the hospital. If the results are negative you will not be contacted.  Please be aware that this antibiotic could put you at higher risk for rupturing a tendon.   Please follow up with your urologist in regards to your visit to the ER today.   You will need to return to the emergency department for any fevers, persistent pain, persistent vomiting, inability to urinate, or any new or worsening symptoms.

## 2018-12-31 NOTE — ED Notes (Signed)
Patient verbalizes understanding of discharge instructions. Opportunity for questioning and answers were provided. 

## 2018-12-31 NOTE — ED Notes (Signed)
Pt given in and out cath, pt's stated they were okay doing the cath themselves. Crystal(RN) present.

## 2018-12-31 NOTE — ED Provider Notes (Signed)
[place] EMERGENCY DEPARTMENT Provider Note   CSN: [id number] Arrival date & time: 12/31/18  1122    History   Chief Complaint Chief Complaint  Patient presents with  . Flank Pain    HPI Greg [name] is a 78 y.o. male.     HPI   Pt is a 78 y/o male with a h/o bladder atony, [name], colon cancer s/p left hydro-colectomy with colostomy, hypertension, hyperlipidemia who presents to the ED today for evaluation of right flank pain that began yesterday but worsened this morning. Pain is constant. At rest pain is a 2/10. States that the pain is worse when he turns or moves in a certain way and he rates it at a 5-6/10 during these times. Pain feels sharp when he moves. Has tried no interventions for his sxs.  No fevers, abdominal pain, nausea, vomiting, diarrhea, or constipation. No dysuria, frequency, or hematuria.  No chest pain, shortness of breath, or pain with inspiration. No recent falls or heavy lifting.  Oncologist: Dr. Burr Medico  Past Medical History:  Diagnosis Date  . 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 [name] (benign prostatic hyperplasia)    hyposensitive bladder, nodular prostate with increased PSA (10.23 in 03/2006), s/p prostate biopsy 2006 that was normal (Dr Jeffie Pollock) he recommended TURP.  . Cancer ([place])   . Chronic knee pain    left  . Epididymitis    right  . Frozen shoulder    left frozen shoulder  . Gingivitis   . Hyperlipidemia   . Hypertension   . Right shoulder pain    sx in 06/2005  . Shingles   . Sinus bradycardia     Patient Active Problem List   Diagnosis Date Noted  . Neuropathy due to chemotherapeutic drug ([place]) 08/31/2015  . Cancer of sigmoid colon ([place]) 03/19/2015  . Allergic rhinitis 03/11/2014  . Healthcare maintenance 09/08/2012  . HLD (hyperlipidemia) 12/06/2008  . Chronic sinus bradycardia 07/27/2008  .  ATONY OF BLADDER 03/18/2008  . Essential hypertension 11/04/2006  . Benign prostatic hyperplasia 11/04/2006    Past Surgical History:  Procedure Laterality Date  . COLOSTOMY N/A 03/12/2015   Procedure: COLOSTOMY;  Surgeon: Judeth Horn, MD;  Location: [place];  Service: General;  Laterality: N/A;  . COLOSTOMY REVISION N/A 03/12/2015   Procedure:  RESECTION SIGMOID COLON;  Surgeon: Judeth Horn, MD;  Location: [place];  Service: General;  Laterality: N/A;  . hydrocelectomy  11/14/2010   left hydrocelectomy  . KNEE SURGERY     L knee  . LAPAROTOMY N/A 03/12/2015   Procedure: EXPLORATORY LAPAROTOMY;  Surgeon: Judeth Horn, MD;  Location: [place];  Service: General;  Laterality: N/A;  . right shoulder arthroscopic surgery  06/2005  . TRANSURETHRAL RESECTION OF PROSTATE     status post TURP 2/2 [name] in 2008  . VASECTOMY          Home Medications    Prior to Admission medications   Medication Sig Start Date End Date Taking? Authorizing Provider  amLODipine (NORVASC) 5 MG tablet Take 1 tablet (5 mg total) by mouth daily. 09/29/18   Velna Ochs, MD  cetirizine (ZYRTEC) 10 MG tablet Take 1 tablet (10 mg total) by mouth at bedtime. 09/29/18   Velna Ochs, MD  Chlorpheniramine-APAP (CORICIDIN) 2-325 MG TABS Take 1 tablet every 6 (six) hours as needed by mouth. 09/09/17   Guilloud,  Hoyle Sauer, MD  ciprofloxacin (CIPRO) 500 MG tablet Take 1 tablet (500 mg total) by mouth every 12 (twelve) hours for 10 days. 12/31/18 01/10/19  Venna Berberich S, PA-C  fluticasone (FLONASE) 50 MCG/ACT nasal spray Place 2 sprays into both nostrils daily. 09/29/18 09/29/19  Velna Ochs, MD  pravastatin (PRAVACHOL) 20 MG tablet TAKE 1 TABLET (20 MG TOTAL) DAILY BY MOUTH. 12/15/18   Velna Ochs, MD  sulfamethoxazole-trimethoprim (BACTRIM DS,SEPTRA DS) 800-160 MG tablet Take 1 tablet by mouth daily. 09/24/18   [provider]    Family History Family History  Problem Relation Age of Onset  . Cancer  Father        unknown type cancer     Social History Social History   Tobacco Use  . Smoking status: Former Smoker    Packs/day: 0.50    Years: 25.00    Pack years: 12.50    Types: Cigarettes    Last attempt to quit: 02/07/1988    Years since quitting: 30.9  . Smokeless tobacco: Never Used  Substance Use Topics  . Alcohol use: Yes    Alcohol/week: 0.0 standard drinks    Comment: occasional   . Drug use: No     Allergies   Ciprofloxacin hcl and Ibuprofen   Review of Systems Review of Systems  Constitutional: Negative for chills and fever.  HENT: Positive for congestion and rhinorrhea. Negative for ear pain and sore throat.   Eyes: Negative for visual disturbance.  Respiratory: Negative for cough and shortness of breath.   Cardiovascular: Positive for leg swelling (intermittent, chronic). Negative for chest pain.  Gastrointestinal: Negative for abdominal pain, constipation, diarrhea, nausea and vomiting.  Genitourinary: Positive for flank pain. Negative for dysuria, hematuria and urgency.  Musculoskeletal: Negative for back pain.  Skin: Negative for rash.  Neurological: Negative for headaches.  All other systems reviewed and are negative.    Physical Exam Updated Vital Signs BP 114/75   Pulse (!) 55   Temp 98.1 F (36.7 C) (Oral)   Resp 14   SpO2 98%   Physical Exam Vitals signs and nursing note reviewed.  Constitutional:      General: He is not in acute distress.    Appearance: He is well-developed. He is not ill-appearing or toxic-appearing.  HENT:     Head: Normocephalic and atraumatic.  Eyes:     Conjunctiva/sclera: Conjunctivae normal.  Neck:     Musculoskeletal: Neck supple.  Cardiovascular:     Rate and Rhythm: Normal rate and regular rhythm.     Heart sounds: Normal heart sounds. No murmur.     Comments: 2+ radial and BP pulses Pulmonary:     Effort: Pulmonary effort is normal. No respiratory distress.     Breath sounds: Normal breath sounds.  No stridor. No wheezing or rhonchi.     Comments: No TTP to the chest wall and no overlying skin changes Abdominal:     General: Bowel sounds are normal.     Palpations: Abdomen is soft.     Tenderness: There is no abdominal tenderness. There is right CVA tenderness. There is no left CVA tenderness, guarding or rebound.     Comments: Colostomy to LLQ  Musculoskeletal:     Comments: Trace ble edema, no calf ttp  Skin:    General: Skin is warm and dry.  Neurological:     Mental Status: He is alert.     ED Treatments / Results  Labs (all labs ordered are listed, but only  abnormal results are displayed) Labs Reviewed  COMPREHENSIVE METABOLIC PANEL - Abnormal; Notable for the following components:      Result Value   Calcium 8.6 (*)    GFR calc non Af Amer 60 (*)    All other components within normal limits  URINALYSIS, ROUTINE W REFLEX MICROSCOPIC - Abnormal; Notable for the following components:   APPearance HAZY (*)    Hgb urine dipstick TRACE (*)    Ketones, ur 15 (*)    Nitrite POSITIVE (*)    Leukocytes,Ua MODERATE (*)    All other components within normal limits  URINALYSIS, MICROSCOPIC (REFLEX) - Abnormal; Notable for the following components:   Bacteria, UA MANY (*)    All other components within normal limits  URINE CULTURE  CBC WITH DIFFERENTIAL/PLATELET  LIPASE, BLOOD    EKG None  Radiology No results found.  Procedures Procedures (including critical care time)  Medications Ordered in ED Medications  acetaminophen (TYLENOL) tablet 650 mg (650 mg Oral Given 12/31/18 1209)     Initial Impression / Assessment and Plan / ED Course  I have reviewed the triage vital signs and the nursing notes.  Pertinent labs & imaging results that were available during my care of the patient were reviewed by me and considered in my medical decision making (see chart for details).    Final Clinical Impressions(s) / ED Diagnoses   Final diagnoses:  Lower urinary tract  infectious disease   Patient presenting with right-sided flank pain that began yesterday.   He is afebrile.  His vital signs are normal.  Abdomen is soft and nontender.  No GI or GU symptoms.  He does have some mild tenderness in the right flank area.  No overlying skin changes or ecchymosis.  CBC wnl CMP without gross electrolyte derangement. Normal kidney and liver function Lipase WNL UA with leukocytes, nitrites, 0-5 rbc, 11-20 wbc, and many bacteria. Will send culture.   Patient's labs are reassuring.  UA consistent with urinary tract infection.  Patient's right-sided flank pain seems musculoskeletal in nature based on his reports that it is worse with movement and certain positions however in the setting of his urinary tract infection will cover him for potential pyelonephritis with Cipro.  Cipro chosen based on previous culture results from 2016 per epic.  Possible Adverse effects dicussed with patient. Have advised patient to call his urologist to make an appointment for follow-up.  Return precautions were discussed. He voices understanding of the plan and reasons to return. All questions answered.   ED Discharge Orders         Ordered    ciprofloxacin (CIPRO) 500 MG tablet  Every 12 hours     12/31/18 1335           Shonteria Abeln S, PA-C 01/01/19 1056    Little, Wenda Overland, MD 01/04/19 1556

## 2019-01-01 LAB — URINE CULTURE

## 2019-01-02 ENCOUNTER — Ambulatory Visit (INDEPENDENT_AMBULATORY_CARE_PROVIDER_SITE_OTHER): Payer: Medicare Other | Admitting: Internal Medicine

## 2019-01-02 ENCOUNTER — Encounter: Payer: Self-pay | Admitting: Internal Medicine

## 2019-01-02 ENCOUNTER — Other Ambulatory Visit: Payer: Self-pay

## 2019-01-02 VITALS — BP 105/62 | HR 50 | Temp 97.7°F | Ht 71.0 in | Wt 196.1 lb

## 2019-01-02 VITALS — BP 105/62 | HR 50 | Temp 97.7°F

## 2019-01-02 DIAGNOSIS — Z792 Long term (current) use of antibiotics: Secondary | ICD-10-CM | POA: Diagnosis not present

## 2019-01-02 DIAGNOSIS — Z0000 Encounter for general adult medical examination without abnormal findings: Secondary | ICD-10-CM

## 2019-01-02 DIAGNOSIS — N12 Tubulo-interstitial nephritis, not specified as acute or chronic: Secondary | ICD-10-CM | POA: Diagnosis not present

## 2019-01-02 DIAGNOSIS — N312 Flaccid neuropathic bladder, not elsewhere classified: Secondary | ICD-10-CM | POA: Diagnosis not present

## 2019-01-02 MED ORDER — LEVOFLOXACIN 750 MG PO TABS: 750.0000 mg | ORAL_TABLET | Freq: Every day | ORAL | 0 refills | Status: AC

## 2019-01-02 NOTE — Patient Instructions (Signed)
Thank you for allowing [place] to provide your care today. Today we discussed your right kidney infection.   I have ordered the following labs for you:  Urine culture  Today we made the following changes to your medications:   Please START taking  Levofloxacin (LEVAQUIN) 750 MG tablet for the next 7 days.   If your pain worsens, develop fever, chills, uncontrollable nausea or vomiting, please call the clinic or return to the emergency department.   Please follow-up if your symptoms do not improve.    Should you have any questions or concerns please call the internal medicine clinic at [phone].

## 2019-01-02 NOTE — Progress Notes (Signed)
Subjective:   Greg [name] is a 78 y.o. male who presents for a [employer] Visit.  The following items have been reviewed and updated today in the appropriate area in the EMR.   Health Risk Assessment  Height, weight, BMI, and BP Visual acuity if needed Depression screen Fall risk / safety level Advance directive discussion Medical and family history were reviewed and updated Updating list of other providers & suppliers Medication reconciliation, including over the counter medicines Cognitive screen Written screening schedule Risk Factor list Personalized health advice, risky behaviors, and treatment advice     Social History   Social History Narrative   Single, lives alone in apartment   No children   Several local siblings in area-will take him to grocery store, etc   Rides bus (75cents/trip) and bus is right outside his house   Prior employment [employer]   Reports strong faith helps him cope with life   Has had to self-cath twice daily since 2008 -Dr. Jeffie Pollock   [facility] for ostomy care and supplies   Ambulates w/walker-independent in ADL's-cooks pot pies and frozen dinners      Current Social History 01/02/2019        Patient lives alone in an apartment which is 1 story. There are not steps up to the entrance the patient uses.       Patient's method of transportation is city bus.      The highest level of education was some high school.      The patient is currently retired.      Identified important Relationships are  "God, I got it right.  I also go riding with my brother sometimes".        Interests / Fun: "I love going to football and basketball games, mainly high school and college.  I've also been to some pro games and met Alvy Beal and Shak".       Current Stressors: "I do not have any stressors in my life right now".       Religious / Personal Beliefs: I believe in God"          Objective:    Vitals: BP 105/62 (BP  Location: Left Arm, Patient Position: Sitting, Cuff Size: Normal)   Pulse (!) 50   Temp 97.7 F (36.5 C) (Oral)   Ht _0  (1.803 m)   Wt 196 lb 1.6 oz (89 kg)   SpO2 99%   BMI 27.35 kg/m   Activities of Daily Living In your present state of health, do you have any difficulty performing the following activities: 01/02/2019 09/29/2018  Hearing? N N  Vision? N N  Comment recent cataract surgery -  Difficulty concentrating or making decisions? N N  Walking or climbing stairs? N Y  Comment - uses a cane "no more than usual"   Dressing or bathing? N N  Doing errands, shopping? N N  Some recent data might be hidden    Goals Goals    . Blood Pressure < 140/90    . LDL CALC < 130       Fall Risk Fall Risk  01/02/2019 09/29/2018 09/09/2017 05/06/2017 09/20/2016  Falls in the past year? 0 0 No No No  Risk Factor Category  - - High Fall Risk - -  Risk for fall due to : - Impaired balance/gait Impaired balance/gait - -  Risk for fall due to: Comment - - uses a cane - -  Follow up  Education provided;Falls prevention discussed Falls prevention discussed Falls prevention discussed - -   Handout on Fall Prevention: Home Exercise Program, Access codes [email or web address] and [email or web address] given to patient with exercise band.   Depression Screen PHQ 2/9 Scores 01/02/2019 09/29/2018 09/09/2017 05/06/2017  PHQ - 2 Score 0 0 0 0  PHQ- 9 Score 0 1 - -     Cognitive Testing I assessed the patient for cognitive issues and the patient did not have issues with his / her cognition.  Mini-Cog - 01/02/19 1615    Normal clock drawing test?  yes    How many words correct?  1       Assessment and Plan:   During the course of the visit and medication reconciliation, it was noted the patient was placed on Cipro recently from the ED for s/s of UTI.  The patient is allergic to Cipro and did not have the RX filled.  The patient was still complaining of mild right flank pain which was discussed with Dr. Sharon Seller.  The  patient was worked into the [facility] schedule after his AWV for evaluation for the UTI.    During the course of the visit the patient was educated and counseled about appropriate screening and preventive services as documented in the assessment and plan.  The printed AVS was given to the patient and included an updated screening schedule, a list of risk factors, and personalized health advice.        Greg Roger, RN  01/02/2019

## 2019-01-02 NOTE — Assessment & Plan Note (Signed)
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 He started having CVA tenderness three days ago and went to the emergency room. UA was positive for infection, and he was given cipro, which has given him diarrhea in the past so he did not take this. Culture in the ED was contaminated. He denies, fever, chills, nausea, pain with urinary catheter.   - in & out cath for urine culture  - levo 750 MG qd for seven days  - advised to return if symptoms do not improve in the next few days or if he develops fever, n/v, or chills.

## 2019-01-02 NOTE — Progress Notes (Signed)
   CC: flank tenderness  HPI:  [name] is a 78 y.o. with PMH as below.   Please see A&P for assessment of the patient's acute and chronic medical conditions.   Past Medical History:  Diagnosis Date  . 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 [name] (benign prostatic hyperplasia)    hyposensitive bladder, nodular prostate with increased PSA (10.23 in 03/2006), s/p prostate biopsy 2006 that was normal (Dr Greg [name]) he recommended TURP.  . Cancer ([place])   . Chronic knee pain    left  . Epididymitis    right  . Frozen shoulder    left frozen shoulder  . Gingivitis   . Hyperlipidemia   . Hypertension   . Right shoulder pain    sx in 06/2005  . Shingles   . Sinus bradycardia    Review of Systems:   Review of Systems  Constitutional: Negative for chills and fever.  Gastrointestinal: Negative for nausea and vomiting.  Genitourinary: Positive for flank pain. Negative for dysuria, frequency and hematuria.   Physical Exam:  Constitution: NAD, appears stated age HENT: [place]/AT Respiratory: non-labored breathing Abdominal: NTTP, non-distended MSK: +CVA tenderness, no edema  Skin: c/d/i    Vitals:   01/02/19 1500  BP: (P) 105/62  Pulse: (!) (P) 50  Temp: (P) 97.7 F (36.5 C)  SpO2: (P) 99%     Assessment & Plan:   See Encounters Tab for problem based charting.  Patient discussed with Dr. Angelia [name]

## 2019-01-02 NOTE — Patient Instructions (Addendum)
Annual Wellness Visit   Medicare Covered Preventative Screenings and Services  Services & Screenings Men and Women Who How Often Need? Date of Last Service Action  Abdominal Aortic Aneurysm Adults with AAA risk factors Once     Alcohol Misuse and Counseling All Adults Screening once a year if no alcohol misuse. Counseling up to 4 face to face sessions. yes  Screen completed  Bone Density Measurement  Adults at risk for osteoporosis Once every 2 yrs     Lipid Panel Z13.6 All adults without CV disease Once every 5 yrs No 09/29/18   Colorectal Cancer   Stool sample or  Colonoscopy All adults 24 and older   Once every year  Every 10 years No  Hx of Colon Cancer, s/p resection - follows w/ Onc for surveillance  Depression All Adults Once a year  Today   Diabetes Screening Blood glucose, post glucose load, or GTT Z13.1  All adults at risk  Pre-diabetics  Once per year  Twice per year     Diabetes  Self-Management Training All adults Diabetics 10 hrs first year; 2 hours subsequent years. Requires Copay     Glaucoma  Diabetics  Family history of glaucoma  African Americans 63 yrs +  Hispanic Americans 34 yrs + Annually - requires coppay yes  Call Dr. Zenia Resides office for appointment  Hepatitis C Z72.89 or F19.20  High Risk for HCV  Born between 1945 and 1965  Annually  Once     HIV Z11.4 All adults based on risk  Annually btw ages 106 & 36 regardless of risk  Annually > 26 yrs if at increased risk     Lung Cancer Screening Asymptomatic adults aged 71-77 with 22 pack yr history and current smoker OR quit within the last 15 yrs Annually Must have counseling and shared decision making documentation before first screen     Medical Nutrition Therapy Adults with   Diabetes  Renal disease  Kidney transplant within past 3 yrs 3 hours first year; 2 hours subsequent years     Obesity and Counseling All adults Screening once a year Counseling if BMI 30 or higher  Today BMI 27.31   Tobacco Use Counseling Adults who use tobacco  Up to 8 visits in one year     Vaccines Z23  Hepatitis B  Influenza   Pneumonia  Adults   Once  Once every flu season  Two different vaccines separated by one year     Next Annual Wellness Visit People with Medicare Every year  Today     Services & Screenings Women Who How Often Need  Date of Last Service Action  Mammogram  Z12.31 Women over 36 One baseline ages 22-39. Annually ager 40 yrs+     Pap tests All women Annually if high risk. Every 2 yrs for normal risk women     Screening for cervical cancer with   Pap (Z01.419 nl or Z01.411abnl) &  HPV Z11.51 Women aged 71 to 30 Once every 5 yrs     Screening pelvic and breast exams All women Annually if high risk. Every 2 yrs for normal risk women     Sexually Transmitted Diseases  Chlamydia  Gonorrhea  Syphilis All at risk adults Annually for non pregnant females at increased risk         [place] Men Who How Ofter Need  Date of Last Service Action  Prostate Cancer - DRE & PSA Men over 50 Annually.  DRE  might require a copay.     Sexually Transmitted Diseases  Syphilis All at risk adults Annually for men at increased risk         Things That May Be Affecting Your Health:  Alcohol  Hearing loss  Pain    Depression  Home Safety  Sexual Health   Diabetes  Lack of physical activity X Stress   Difficulty with daily activities  Loneliness  Tiredness   Drug use  Medicines  Tobacco use   Falls X Motor Vehicle Safety  Weight  X Food choices  Oral Health  Other    YOUR PERSONALIZED HEALTH PLAN : 1. Schedule your next subsequent Medicare Wellness visit in one year 2. Attend all of your regular appointments to address your medical issues 3. Complete the preventative screenings and services 4. Please call Dr. Katy Fitch and make your yearly eye appointment.   Fall Prevention in the Home, Adult Falls can cause injuries. They can happen to people of all  ages. There are many things you can do to make your home safe and to help prevent falls. Ask for help when making these changes, if needed. What actions can I take to prevent falls? General Instructions  Use good lighting in all rooms. Replace any light bulbs that burn out.  Turn on the lights when you go into a dark area. Use night-lights.  Keep items that you use often in easy-to-reach places. Lower the shelves around your home if necessary.  Set up your furniture so you have a clear path. Avoid moving your furniture around.  Do not have throw rugs and other things on the floor that can make you trip.  Avoid walking on wet floors.  If any of your floors are uneven, fix them.  Add color or contrast paint or tape to clearly mark and help you see: ? Any grab bars or handrails. ? First and last steps of stairways. ? Where the edge of each step is.  If you use a stepladder: ? Make sure that it is fully opened. Do not climb a closed stepladder. ? Make sure that both sides of the stepladder are locked into place. ? Ask someone to hold the stepladder for you while you use it.  If there are any pets around you, be aware of where they are. What can I do in the bathroom?      Keep the floor dry. Clean up any water that spills onto the floor as soon as it happens.  Remove soap buildup in the tub or shower regularly.  Use non-skid mats or decals on the floor of the tub or shower.  Attach bath mats securely with double-sided, non-slip rug tape.  If you need to sit down in the shower, use a plastic, non-slip stool.  Install grab bars by the toilet and in the tub and shower. Do not use towel bars as grab bars. What can I do in the bedroom?  Make sure that you have a light by your bed that is easy to reach.  Do not use any sheets or blankets that are too big for your bed. They should not hang down onto the floor.  Have a firm chair that has side arms. You can use this for support  while you get dressed. What can I do in the kitchen?  Clean up any spills right away.  If you need to reach something above you, use a strong step stool that has a grab bar.  Keep  electrical cords out of the way.  Do not use floor polish or wax that makes floors slippery. If you must use wax, use non-skid floor wax. What can I do with my stairs?  Do not leave any items on the stairs.  Make sure that you have a light switch at the top of the stairs and the bottom of the stairs. If you do not have them, ask someone to add them for you.  Make sure that there are handrails on both sides of the stairs, and use them. Fix handrails that are broken or loose. Make sure that handrails are as long as the stairways.  Install non-slip stair treads on all stairs in your home.  Avoid having throw rugs at the top or bottom of the stairs. If you do have throw rugs, attach them to the floor with carpet tape.  Choose a carpet that does not hide the edge of the steps on the stairway.  Check any carpeting to make sure that it is firmly attached to the stairs. Fix any carpet that is loose or worn. What can I do on the outside of my home?  Use bright outdoor lighting.  Regularly fix the edges of walkways and driveways and fix any cracks.  Remove anything that might make you trip as you walk through a door, such as a raised step or threshold.  Trim any bushes or trees on the path to your home.  Regularly check to see if handrails are loose or broken. Make sure that both sides of any steps have handrails.  Install guardrails along the edges of any raised decks and porches.  Clear walking paths of anything that might make someone trip, such as tools or rocks.  Have any leaves, snow, or ice cleared regularly.  Use sand or salt on walking paths during winter.  Clean up any spills in your garage right away. This includes grease or oil spills. What other actions can I take?  Wear shoes  that: ? Have a low heel. Do not wear high heels. ? Have rubber bottoms. ? Are comfortable and fit you well. ? Are closed at the toe. Do not wear open-toe sandals.  Use tools that help you move around (mobility aids) if they are needed. These include: ? Canes. ? Walkers. ? Scooters. ? Crutches.  Review your medicines with your doctor. Some medicines can make you feel dizzy. This can increase your chance of falling. Ask your doctor what other things you can do to help prevent falls. Where to find more information  Centers for Disease Control and Prevention, STEADI: https://garcia.biz/  [employer] on Aging: BrainJudge.co.uk Contact a doctor if:  You are afraid of falling at home.  You feel weak, drowsy, or dizzy at home.  You fall at home. Summary  There are many simple things that you can do to make your home safe and to help prevent falls.  Ways to make your home safe include removing tripping hazards and installing grab bars in the bathroom.  Ask for help when making these changes in your home. This information is not intended to replace advice given to you by your health care provider. Make sure you discuss any questions you have with your health care provider. Document Released: 08/04/2009 Document Revised: 05/23/2017 Document Reviewed: 05/23/2017 Elsevier Interactive Patient Education  2019 [place] Maintenance, Male A healthy lifestyle and preventive care is important for your health and wellness. Ask your health care provider about what  schedule of regular examinations is right for you. What should I know about weight and diet? Eat a Healthy Diet  Eat plenty of vegetables, fruits, whole grains, low-fat dairy products, and lean protein.  Do not eat a lot of foods high in solid fats, added sugars, or salt.  Maintain a Healthy Weight Regular exercise can help you achieve or maintain a healthy weight. You should:  Do at least 150  minutes of exercise each week. The exercise should increase your heart rate and make you sweat (moderate-intensity exercise).  Do strength-training exercises at least twice a week. Watch Your Levels of Cholesterol and Blood Lipids  Have your blood tested for lipids and cholesterol every 5 years starting at 79 years of age. If you are at high risk for heart disease, you should start having your blood tested when you are 79 years old. You may need to have your cholesterol levels checked more often if: ? Your lipid or cholesterol levels are high. ? You are older than 79 years of age. ? You are at high risk for heart disease. What should I know about cancer screening? Many types of cancers can be detected early and may often be prevented. Lung Cancer  You should be screened every year for lung cancer if: ? You are a current smoker who has smoked for at least 30 years. ? You are a former smoker who has quit within the past 15 years.  Talk to your health care provider about your screening options, when you should start screening, and how often you should be screened. Colorectal Cancer  Routine colorectal cancer screening usually begins at 79 years of age and should be repeated every 5-10 years until you are 79 years old. You may need to be screened more often if early forms of precancerous polyps or small growths are found. Your health care provider may recommend screening at an earlier age if you have risk factors for colon cancer.  Your health care provider may recommend using home test kits to check for hidden blood in the stool.  A small camera at the end of a tube can be used to examine your colon (sigmoidoscopy or colonoscopy). This checks for the earliest forms of colorectal cancer. Prostate and Testicular Cancer  Depending on your age and overall health, your health care provider may do certain tests to screen for prostate and testicular cancer.  Talk to your health care provider about  any symptoms or concerns you have about testicular or prostate cancer. Skin Cancer  Check your skin from head to toe regularly.  Tell your health care provider about any new moles or changes in moles, especially if: ? There is a change in a moles size, shape, or color. ? You have a mole that is larger than a pencil eraser.  Always use sunscreen. Apply sunscreen liberally and repeat throughout the day.  Protect yourself by wearing long sleeves, pants, a wide-brimmed hat, and sunglasses when outside. What should I know about heart disease, diabetes, and high blood pressure?  If you are 34-18 years of age, have your blood pressure checked every 3-5 years. If you are 31 years of age or older, have your blood pressure checked every year. You should have your blood pressure measured twice--once when you are at a hospital or clinic, and once when you are not at a hospital or clinic. Record the average of the two measurements. To check your blood pressure when you are not at a hospital or  clinic, you can use: ? An automated blood pressure machine at a pharmacy. ? A home blood pressure monitor.  Talk to your health care provider about your target blood pressure.  If you are between 51-23 years old, ask your health care provider if you should take aspirin to prevent heart disease.  Have regular diabetes screenings by checking your fasting blood sugar level. ? If you are at a normal weight and have a low risk for diabetes, have this test once every three years after the age of 64. ? If you are overweight and have a high risk for diabetes, consider being tested at a younger age or more often.  A one-time screening for abdominal aortic aneurysm (AAA) by ultrasound is recommended for men aged 3-75 years who are current or former smokers. What should I know about preventing infection? Hepatitis B If you have a higher risk for hepatitis B, you should be screened for this virus. Talk with your health  care provider to find out if you are at risk for hepatitis B infection. Hepatitis C Blood testing is recommended for:  Everyone born from [birth year] through 1966.  Anyone with known risk factors for hepatitis C. Sexually Transmitted Diseases (STDs)  You should be screened each year for STDs including gonorrhea and chlamydia if: ? You are sexually active and are younger than 79 years of age. ? You are older than 79 years of age and your health care provider tells you that you are at risk for this type of infection. ? Your sexual activity has changed since you were last screened and you are at an increased risk for chlamydia or gonorrhea. Ask your health care provider if you are at risk.  Talk with your health care provider about whether you are at high risk of being infected with HIV. Your health care provider may recommend a prescription medicine to help prevent HIV infection. What else can I do?  Schedule regular health, dental, and eye exams.  Stay current with your vaccines (immunizations).  Do not use any tobacco products, such as cigarettes, chewing tobacco, and e-cigarettes. If you need help quitting, ask your health care provider.  Limit alcohol intake to no more than 2 drinks per day. One drink equals 12 ounces of beer, 5 ounces of wine, or 1 ounces of hard liquor.  Do not use street drugs.  Do not share needles.  Ask your health care provider for help if you need support or information about quitting drugs.  Tell your health care provider if you often feel depressed.  Tell your health care provider if you have ever been abused or do not feel safe at home. This information is not intended to replace advice given to you by your health care provider. Make sure you discuss any questions you have with your health care provider. Document Released: 04/05/2008 Document Revised: 06/06/2016 Document Reviewed: 07/12/2015 Elsevier Interactive Patient Education  2019 [employer].

## 2019-01-05 DIAGNOSIS — R338 Other retention of urine: Secondary | ICD-10-CM | POA: Diagnosis not present

## 2019-01-05 NOTE — Progress Notes (Signed)
Internal Medicine Clinic Attending  Case discussed with Dr. Seawell at the time of the visit.  We reviewed the resident's history and exam and pertinent patient test results.  I agree with the assessment, diagnosis, and plan of care documented in the resident's note.    

## 2019-01-06 ENCOUNTER — Encounter: Payer: Self-pay | Admitting: Internal Medicine

## 2019-01-07 DIAGNOSIS — R338 Other retention of urine: Secondary | ICD-10-CM | POA: Diagnosis not present

## 2019-01-07 LAB — URINE CULTURE

## 2019-01-13 NOTE — Progress Notes (Signed)
I discussed the AWV findings with the RN who conducted the visit. I was present in the office suite and immediately available to provide assistance and direction throughout the time the service was provided.   

## 2019-02-10 DIAGNOSIS — Z933 Colostomy status: Secondary | ICD-10-CM | POA: Diagnosis not present

## 2019-02-20 DIAGNOSIS — R338 Other retention of urine: Secondary | ICD-10-CM | POA: Diagnosis not present

## 2019-03-24 ENCOUNTER — Other Ambulatory Visit: Payer: Self-pay | Admitting: Internal Medicine

## 2019-03-24 DIAGNOSIS — I10 Essential (primary) hypertension: Secondary | ICD-10-CM

## 2019-04-09 DIAGNOSIS — R338 Other retention of urine: Secondary | ICD-10-CM | POA: Diagnosis not present

## 2019-05-06 ENCOUNTER — Inpatient Hospital Stay: Payer: Medicare Other | Attending: Hematology

## 2019-05-06 ENCOUNTER — Inpatient Hospital Stay: Payer: Medicare Other | Admitting: Hematology

## 2019-05-22 DIAGNOSIS — R338 Other retention of urine: Secondary | ICD-10-CM | POA: Diagnosis not present

## 2019-05-22 NOTE — Progress Notes (Signed)
[place]   [phone] [phone]   Clinic Follow up Note   Patient Care Team: Velna Ochs, MD as PCP - General (Internal Medicine) Judeth Horn, MD as Consulting Physician (General Surgery) Truitt Merle, MD as Consulting Physician (Hematology) Irine Seal, MD as Attending Physician (Urology) Tania Ade, RN as Registered Nurse (Medical Oncology) [facility] as Consulting Physician   I connected with Greg [name] on 05/27/2019 at 11:45 AM EDT by telephone visit and verified that I am speaking with the correct person using two identifiers.  I discussed the limitations, risks, security and privacy concerns of performing an evaluation and management service by telephone and the availability of in person appointments. I also discussed with the patient that there may be a patient responsible charge related to this service. The patient expressed understanding and agreed to proceed.   Patient's location:  His home  Provider's location:  My Office   CHIEF COMPLAINT: Follow up stage III colon cancer  SUMMARY OF ONCOLOGIC HISTORY: Oncology History Overview Note  Colon adenocarcinoma   Staging form: Colon and Rectum, AJCC 7th Edition     Pathologic stage from 03/12/2015: Stage IIIB (T4a, N1c, cM0) - Signed by Truitt Merle, MD on 04/05/2015      Cancer of sigmoid colon ([place])  02/27/2015 Imaging   CT abdomen and pelvis showed an annular constricting lesion within the sigmoid colon, no significant adenopathy or distant metastasis. CT abdomen and pelvis on 03/11/2015 showed similar findings with obstruction.   03/12/2015 Initial Diagnosis   Sigmoid colon adenocarcinoma   03/12/2015 Tumor Marker   CEA normal 2.5   03/12/2015 Surgery   Sigmoid colon segmental resection by Dr. Excell Seltzer, surgical margins were negative.   03/12/2015 Pathology Results   Well differentiated invasive adenocarcinoma with associated abundant mucin, 14 lymph  nodes were negative, a tumor deposit was present, surgical margins were negative. pT4N1c   04/26/2015 - 10/20/2015 Chemotherapy   Capecitabine po 2064m q12h, 2 weeks on, one week off, as adjuvant chemo    12/02/2015 Imaging   CT chest, abdomen and pelvis with contrast showed a moderately enlarged left external iliac lymph node, 1 cm, indeterminate, interval decrease size of previously described subcentimeter perirectal nodule, no other evidence of metastatic disease.   12/27/2016 Imaging   CT CAP w contrast  IMPRESSION: 1. Similar size of a left external iliac node, which is not pathologic by size criteria. Otherwise, no evidence of metastatic disease in the chest, abdomen, or pelvis. 2. Tiny liver lesions are felt to be similar over prior exams but warrant followup attention. 3. 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 4. Esophageal air fluid level suggests dysmotility or gastroesophageal reflux.   03/24/2018 Imaging   CT CAP W Contrast 03/24/18 IMPRESSION: No evidence of recurrent or metastatic carcinoma within the chest, abdomen, or pelvis. Increased distention of urinary bladder. This could be due to urinary retention or chronic bladder outlet obstruction; clinical correlation is recommended. Stable enlarged prostate with TURP defect.      CURRENT THERAPY:  Surveillance   INTERVAL HISTORY:  TPeyton Spengleris here for a follow up of colon cancer. He was last seen by me 7 months ago. They identified themselves by birth date. He is doing well. He denies nay new changes since last visit. He went to ER in 12/2018 for UTI which has resolved. He denies pain and notes he is eating well. He notes he has not set  up colostomy reversal but still interested.    REVIEW OF SYSTEMS:   Constitutional: Denies fevers, chills or abnormal weight loss Eyes: Denies blurriness of vision Ears, nose, mouth, throat, and face: Denies  mucositis or sore throat Respiratory: Denies cough, dyspnea or wheezes Cardiovascular: Denies palpitation, chest discomfort or lower extremity swelling Gastrointestinal:  Denies nausea, heartburn or change in bowel habits Skin: Denies abnormal skin rashes Lymphatics: Denies new lymphadenopathy or easy bruising Neurological:Denies numbness, tingling or new weaknesses Behavioral/Psych: Mood is stable, no new changes  All other systems were reviewed with the patient and are negative.  MEDICAL HISTORY:  Past Medical History:  Diagnosis Date  . 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 [name] (benign prostatic hyperplasia)    hyposensitive bladder, nodular prostate with increased PSA (10.23 in 03/2006), s/p prostate biopsy 2006 that was normal (Dr Jeffie Pollock) he recommended TURP.  . Cancer ([place])   . Chronic knee pain    left  . Epididymitis    right  . Frozen shoulder    left frozen shoulder  . Gingivitis   . Hyperlipidemia   . Hypertension   . Right shoulder pain    sx in 06/2005  . Shingles   . Sinus bradycardia     SURGICAL HISTORY: Past Surgical History:  Procedure Laterality Date  . COLOSTOMY N/A 03/12/2015   Procedure: COLOSTOMY;  Surgeon: Judeth Horn, MD;  Location: [facility];  Service: General;  Laterality: N/A;  . COLOSTOMY REVISION N/A 03/12/2015   Procedure:  RESECTION SIGMOID COLON;  Surgeon: Judeth Horn, MD;  Location: [facility];  Service: General;  Laterality: N/A;  . hydrocelectomy  11/14/2010   left hydrocelectomy  . KNEE SURGERY     L knee  . LAPAROTOMY N/A 03/12/2015   Procedure: EXPLORATORY LAPAROTOMY;  Surgeon: Judeth Horn, MD;  Location: [facility];  Service: General;  Laterality: N/A;  . right shoulder arthroscopic surgery  06/2005  . TRANSURETHRAL RESECTION OF PROSTATE     status post TURP 2/2 [name] in 2008  . VASECTOMY      I have reviewed the social history and  family history with the patient and they are unchanged from previous note.  ALLERGIES:  is allergic to ciprofloxacin hcl and ibuprofen.  MEDICATIONS:  Current Outpatient Medications  Medication Sig Dispense Refill  . amLODipine (NORVASC) 5 MG tablet TAKE 1 TABLET BY MOUTH EVERY DAY 90 tablet 1  . AZO-CRANBERRY PO Take 500 mg by mouth daily. Two softgels daily    . cetirizine (ZYRTEC) 10 MG tablet Take 1 tablet (10 mg total) by mouth at bedtime. 30 tablet 5  . Chlorpheniramine-APAP (CORICIDIN) 2-325 MG TABS Take 1 tablet every 6 (six) hours as needed by mouth. 56 each 0  . fluticasone (FLONASE) 50 MCG/ACT nasal spray Place 2 sprays into both nostrils daily. (Patient not taking: Reported on 01/02/2019) 16 g 2  . pravastatin (PRAVACHOL) 20 MG tablet TAKE 1 TABLET (20 MG TOTAL) DAILY BY MOUTH. 90 tablet 4  . sulfamethoxazole-trimethoprim (BACTRIM DS,SEPTRA DS) 800-160 MG tablet Take 1 tablet by mouth daily.     No current facility-administered medications for this visit.     PHYSICAL EXAMINATION: ECOG PERFORMANCE STATUS: 0 - Asymptomatic  No vitals taken today, Exam not performed today   LABORATORY DATA:  I have reviewed the data as listed CBC Latest Ref Rng & Units 12/31/2018 11/05/2018 03/24/2018  WBC 4.0 -  10.5 K/uL 5.2 5.9 7.9  Hemoglobin 13.0 - 17.0 g/dL 13.4 13.6 13.9  Hematocrit 39.0 - 52.0 % 43.3 41.9 42.9  Platelets 150 - 400 K/uL 241 224 215     CMP Latest Ref Rng & Units 12/31/2018 11/05/2018 03/24/2018  Glucose 70 - 99 mg/dL 96 99 89  BUN 8 - 23 mg/dL _0 Creatinine 0.61 - 1.24 mg/dL 1.16 1.30(H) 1.10  Sodium 135 - 145 mmol/L 137 140 140  Potassium 3.5 - 5.1 mmol/L 3.5 4.5 4.6  Chloride 98 - 111 mmol/L 104 106 104  CO2 22 - 32 mmol/L _1 Calcium 8.9 - 10.3 mg/dL 8.6(L) 8.6(L) 9.2  Total Protein 6.5 - 8.1 g/dL 6.9 7.3 7.8  Total Bilirubin 0.3 - 1.2 mg/dL 0.8 0.4 0.4  Alkaline Phos 38 - 126 U/L 71 87 95  AST 15 - 41 U/L 20 14(L) 18  ALT 0 - 44 U/L _2 RADIOGRAPHIC STUDIES: I have personally reviewed the radiological images as listed and agreed with the findings in the report. No results found.   ASSESSMENT & PLAN:  Greg [name] is a 78 y.o. male with   1. Sigmoid colon cancer, pT4N1cM0, stage IIIB, grade 1, MMR normal -He was diagnosed in 02/2015. He is s/p sigmoid colon resection and adjuvant Xeloda.  -His subsequent scans since 04/2016 have been NED.  -He still has colostomy bag in place. Has not proceeded with reversal surgery due to not having anyone to go with him for pre-op tests. I recommend he proceed with surgery soon. He has not set up yet but is still interested.  -He is also overdue for colonoscopy, I encouraged him to set up this year if possible. I will send a message to Dr. Paulita Fujita  -He is clinically doing well and stable. He has no concerns. -He is over 4 years since diagnosis. His risk of recurrence has significantly decreased. Will continue surveillance. I do not plan to repeat routine surveillance scans. -F/u in 6 months    2. Atony of bladder -He'll continue follow-up with his urologist.   3. Hypertension, arthritis -He will continue follow-up with his primary care physician   Plan -Continue Surveillance  -Lab and f/u in 6 months  -Send message to Dr. Paulita Fujita for his colonoscopy    No problem-specific Assessment & Plan notes found for this encounter.   No orders of the defined types were placed in this encounter.  I discussed the assessment and treatment plan with the patient. The patient was provided an opportunity to ask questions and all were answered. The patient agreed with the plan and demonstrated an understanding of the instructions.  The patient was advised to call back or seek an in-person evaluation if the symptoms worsen or if the condition fails to improve as anticipated.  I provided 10 minutes of non face-to-face telephone visit time during this encounter, and > 50% was spent counseling  as documented under my assessment & plan.    Truitt Merle, MD 05/27/2019   I, Joslyn Devon, am acting as scribe for Truitt Merle, MD.   I have reviewed the above documentation for accuracy and completeness, and I agree with the above.

## 2019-05-26 ENCOUNTER — Telehealth: Payer: Self-pay | Admitting: Hematology

## 2019-05-27 ENCOUNTER — Inpatient Hospital Stay: Payer: Medicare Other | Attending: Hematology | Admitting: Hematology

## 2019-05-27 ENCOUNTER — Inpatient Hospital Stay: Payer: Medicare Other

## 2019-05-27 DIAGNOSIS — I10 Essential (primary) hypertension: Secondary | ICD-10-CM

## 2019-05-27 DIAGNOSIS — C187 Malignant neoplasm of sigmoid colon: Secondary | ICD-10-CM | POA: Diagnosis not present

## 2019-05-28 ENCOUNTER — Telehealth: Payer: Self-pay | Admitting: Hematology

## 2019-05-28 ENCOUNTER — Encounter: Payer: Self-pay | Admitting: Hematology

## 2019-05-28 NOTE — Telephone Encounter (Signed)
Scheduled appt per 8/5 los.  Left a voice message of appt date and time.

## 2019-07-08 DIAGNOSIS — R339 Retention of urine, unspecified: Secondary | ICD-10-CM | POA: Diagnosis not present

## 2019-07-08 DIAGNOSIS — Z87440 Personal history of urinary (tract) infections: Secondary | ICD-10-CM | POA: Diagnosis not present

## 2019-08-07 ENCOUNTER — Other Ambulatory Visit: Payer: Self-pay

## 2019-08-07 NOTE — Patient Outreach (Signed)
[facility]) Care Management  08/07/2019  Josephus Cruthirds [birth year] [id number]  Medication Adherence call to Mr. Diablock Compliant Voice message left with a call back number. Mr. Swaringen is showing past due on Pravastatin 20 mg under [facility].   [facility] Management Direct Dial [phone]  Fax [phone] [email or web address]

## 2019-10-20 DIAGNOSIS — R339 Retention of urine, unspecified: Secondary | ICD-10-CM | POA: Diagnosis not present

## 2019-10-20 DIAGNOSIS — Z87440 Personal history of urinary (tract) infections: Secondary | ICD-10-CM | POA: Diagnosis not present

## 2019-11-27 ENCOUNTER — Inpatient Hospital Stay: Payer: Medicare Other | Admitting: Hematology

## 2019-11-27 ENCOUNTER — Inpatient Hospital Stay: Payer: Medicare Other | Attending: Internal Medicine

## 2019-11-30 DIAGNOSIS — N3020 Other chronic cystitis without hematuria: Secondary | ICD-10-CM | POA: Diagnosis not present

## 2019-12-03 ENCOUNTER — Other Ambulatory Visit: Payer: Self-pay | Admitting: Student in an Organized Health Care Education/Training Program

## 2019-12-03 DIAGNOSIS — I10 Essential (primary) hypertension: Secondary | ICD-10-CM

## 2020-01-13 ENCOUNTER — Other Ambulatory Visit: Payer: Self-pay | Admitting: Internal Medicine

## 2020-01-13 ENCOUNTER — Encounter: Payer: Medicare Other | Admitting: Internal Medicine

## 2020-01-20 ENCOUNTER — Other Ambulatory Visit: Payer: Self-pay

## 2020-01-20 ENCOUNTER — Telehealth: Payer: Self-pay | Admitting: *Deleted

## 2020-01-20 ENCOUNTER — Encounter: Payer: Self-pay | Admitting: Internal Medicine

## 2020-01-20 ENCOUNTER — Ambulatory Visit (INDEPENDENT_AMBULATORY_CARE_PROVIDER_SITE_OTHER): Payer: Medicare Other | Admitting: Internal Medicine

## 2020-01-20 DIAGNOSIS — E785 Hyperlipidemia, unspecified: Secondary | ICD-10-CM | POA: Diagnosis not present

## 2020-01-20 DIAGNOSIS — I10 Essential (primary) hypertension: Secondary | ICD-10-CM

## 2020-01-20 DIAGNOSIS — N312 Flaccid neuropathic bladder, not elsewhere classified: Secondary | ICD-10-CM | POA: Diagnosis not present

## 2020-01-20 MED ORDER — AMLODIPINE BESYLATE 5 MG PO TABS: 5.0000 mg | ORAL_TABLET | Freq: Every day | ORAL | 3 refills | Status: DC

## 2020-01-20 MED ORDER — PRAVASTATIN SODIUM 20 MG PO TABS: 20.0000 mg | ORAL_TABLET | Freq: Every day | ORAL | 3 refills | Status: DC

## 2020-01-20 NOTE — Assessment & Plan Note (Signed)
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  He was recently seen in our acute care clinic 2 weeks ago due to concern for pyelonephritis.  Urine cultures grew E. coli from that visit resistant to ampicillin, nitrofurantoin, and Bactrim.  Since that visit, he denies any pain or fevers.  He was prescribed 1 week of levofloxacin, but says he never picked this up from the pharmacy.  He was unaware that it was prescribed.  He has followed up with his urologist over the phone who has him taking Azo cranberry supplements.  Given he is asymptomatic, will hold off on treating for now.  Suspect he is colonized given his chronic catheterizations.  We will continue to monitor.

## 2020-01-20 NOTE — Assessment & Plan Note (Signed)
Refilled pravastatin 20 mg daily.

## 2020-01-20 NOTE — Telephone Encounter (Signed)
Called pt x 2 to complete intake questions per telehealth appt; no answer.

## 2020-01-20 NOTE — Assessment & Plan Note (Signed)
Patient reports compliance with his amlodipine 5 mg daily.  Reports that he checks his blood pressure at home and that it has been "good".  But he does not have any numbers to report over the phone.  He was well controlled on this regimen at his last visit.  Sent refills to his pharmacy.

## 2020-01-20 NOTE — Progress Notes (Signed)
    This is a telephone encounter between [facility] and Greg [name] on 01/20/2020 for follow up of pyelonephritis. The visit was conducted with the patient located at home and Greg [name] at [facility]. The patient's identity was confirmed using their DOB and current address. The patient has consented to being evaluated through a telephone encounter and understands the associated risks (an examination cannot be done and the patient may need to come in for an appointment) / benefits (allows the patient to remain at home, decreasing exposure to coronavirus). I personally spent 10 minutes on medical discussion.   CC: Urine infection   HPI:  [name] is a 79 y.o. male with past medical history outlined below. Patient was contacted for a telehealth appointment for follow up of recent pyelonephritis. For the details of today's visit, please refer to the assessment and plan.   Review of Systems  Respiratory: Negative for shortness of breath.   Cardiovascular: Negative for chest pain.     Assessment & Plan:   See Encounters Tab for problem based charting.

## 2020-01-20 NOTE — Progress Notes (Signed)
Called pt x 2 to complete intake questions per telehealth appt; no answer.

## 2020-01-27 DIAGNOSIS — Z933 Colostomy status: Secondary | ICD-10-CM | POA: Diagnosis not present

## 2020-02-01 DIAGNOSIS — R339 Retention of urine, unspecified: Secondary | ICD-10-CM | POA: Diagnosis not present

## 2020-02-01 DIAGNOSIS — Z87440 Personal history of urinary (tract) infections: Secondary | ICD-10-CM | POA: Diagnosis not present

## 2020-02-24 ENCOUNTER — Encounter: Payer: Self-pay | Admitting: *Deleted

## 2020-02-24 NOTE — Progress Notes (Signed)

## 2020-02-29 NOTE — Progress Notes (Signed)
Things That May Be Affecting Your Health:  Alcohol  Hearing loss  Pain    Depression  Home Safety  Sexual Health   Diabetes  Lack of physical activity  Stress   Difficulty with daily activities  Loneliness  Tiredness   Drug use  Medicines  Tobacco use   Falls  Motor Vehicle Safety  Weight   Food choices  Oral Health  Other - chronic atonic bladder requires self cath     YOUR PERSONALIZED HEALTH PLAN : 1. Schedule your next subsequent Medicare Wellness visit in one year 2. Attend all of your regular appointments to address your medical issues 3. Complete the preventative screenings and services   Annual Wellness Visit   Medicare Covered Preventative Screenings and [place] Men and Women Who How Often Need? Date of Last Service Action  Abdominal Aortic Aneurysm Adults with AAA risk factors Once     Alcohol Misuse and Counseling All Adults Screening once a year if no alcohol misuse. Counseling up to 4 face to face sessions.     Bone Density Measurement  Adults at risk for osteoporosis Once every 2 yrs yes    Lipid Panel Z13.6 All adults without CV disease Once every 5 yrs no 09/29/2018   Colorectal Cancer   Stool sample or  Colonoscopy All adults 4 and older   Once every year  Every 10 years     Depression All Adults Once a year  Today   Diabetes Screening Blood glucose, post glucose load, or GTT Z13.1  All adults at risk  Pre-diabetics  Once per year  Twice per year     Diabetes  Self-Management Training All adults Diabetics 10 hrs first year; 2 hours subsequent years. Requires Copay     Glaucoma  Diabetics  Family history of glaucoma  African Americans 45 yrs +  Hispanic Americans 38 yrs + Annually - requires coppay     Hepatitis C Z72.89 or F19.20  High Risk for HCV  Born between 1945 and 1965  Annually  Once     HIV Z11.4 All adults based on risk  Annually btw ages 34 & 36 regardless of risk  Annually > 65 yrs if at increased  risk     Lung Cancer Screening Asymptomatic adults aged 6-77 with 30 pack yr history and current smoker OR quit within the last 15 yrs Annually Must have counseling and shared decision making documentation before first screen     Medical Nutrition Therapy Adults with   Diabetes  Renal disease  Kidney transplant within past 3 yrs 3 hours first year; 2 hours subsequent years     Obesity and Counseling All adults Screening once a year Counseling if BMI 30 or higher  Today   Tobacco Use Counseling Adults who use tobacco  Up to 8 visits in one year     Vaccines Z23  Hepatitis B  Influenza   Pneumonia  Adults   Once  Once every flu season  Two different vaccines separated by one year     Next Annual Wellness Visit People with Medicare Every year  Today     Services & Screenings Women Who How Often Need  Date of Last Service Action  Mammogram  Z12.31 Women over 3 One baseline ages 71-39. Annually ager 40 yrs+     Pap tests All women Annually if high risk. Every 2 yrs for normal risk women     Screening for cervical cancer with  Pap (Z01.419 nl or Z01.411abnl) &  HPV Z11.51 Women aged 28 to 11 Once every 5 yrs     Screening pelvic and breast exams All women Annually if high risk. Every 2 yrs for normal risk women     Sexually Transmitted Diseases  Chlamydia  Gonorrhea  Syphilis All at risk adults Annually for non pregnant females at increased risk         [place] Men Who How Ofter Need  Date of Last Service Action  Prostate Cancer - DRE & PSA Men over 50 Annually.  DRE might require a copay.     Sexually Transmitted Diseases  Syphilis All at risk adults Annually for men at increased risk

## 2020-03-07 ENCOUNTER — Other Ambulatory Visit: Payer: Self-pay | Admitting: Internal Medicine

## 2020-03-07 NOTE — Telephone Encounter (Signed)
Need refill on amLODipine (NORVASC) 5 MG tablet ;pt contact [phone]   [id number] - [place], [place] - [address]

## 2020-03-07 NOTE — Telephone Encounter (Signed)
Amlodipine 5mg  , #90 with 3 RF were sent via [place] on 01/20/20 Pt called, VM obtained and hippa compliant message left on VM. SChaplin, RN,BSN

## 2020-03-20 ENCOUNTER — Emergency Department (HOSPITAL_COMMUNITY)
Admission: EM | Admit: 2020-03-20 | Discharge: 2020-03-20 | Disposition: A | Discharge status: Home / Self Care | Payer: Medicare Other | Attending: Emergency Medicine | Admitting: Emergency Medicine

## 2020-03-20 ENCOUNTER — Emergency Department (HOSPITAL_COMMUNITY): Payer: Medicare Other

## 2020-03-20 ENCOUNTER — Other Ambulatory Visit: Payer: Self-pay

## 2020-03-20 ENCOUNTER — Encounter (HOSPITAL_COMMUNITY): Payer: Self-pay | Admitting: Emergency Medicine

## 2020-03-20 DIAGNOSIS — I10 Essential (primary) hypertension: Secondary | ICD-10-CM | POA: Insufficient documentation

## 2020-03-20 DIAGNOSIS — R1032 Left lower quadrant pain: Secondary | ICD-10-CM | POA: Diagnosis not present

## 2020-03-20 DIAGNOSIS — Z87891 Personal history of nicotine dependence: Secondary | ICD-10-CM | POA: Diagnosis not present

## 2020-03-20 DIAGNOSIS — Z85038 Personal history of other malignant neoplasm of large intestine: Secondary | ICD-10-CM | POA: Diagnosis not present

## 2020-03-20 DIAGNOSIS — M545 Low back pain, unspecified: Secondary | ICD-10-CM

## 2020-03-20 DIAGNOSIS — R109 Unspecified abdominal pain: Secondary | ICD-10-CM | POA: Diagnosis not present

## 2020-03-20 LAB — URINALYSIS, ROUTINE W REFLEX MICROSCOPIC
Bilirubin Urine: NEGATIVE
Glucose, UA: NEGATIVE mg/dL
Ketones, ur: 5 mg/dL — AB
Nitrite: NEGATIVE
Protein, ur: 30 mg/dL — AB
Specific Gravity, Urine: 1.021 (ref 1.005–1.030)
pH: 7 (ref 5.0–8.0)

## 2020-03-20 MED ORDER — KETOROLAC TROMETHAMINE 60 MG/2ML IM SOLN
15.0000 mg | Freq: Once | INTRAMUSCULAR | Status: AC
  Administered 2020-03-20: 15 mg via INTRAMUSCULAR
  Filled 2020-03-20: qty 2

## 2020-03-20 MED ORDER — OXYCODONE HCL 5 MG PO TABS
2.5000 mg | ORAL_TABLET | Freq: Once | ORAL | Status: AC
  Administered 2020-03-20: 2.5 mg via ORAL
  Filled 2020-03-20: qty 1

## 2020-03-20 MED ORDER — ACETAMINOPHEN 500 MG PO TABS
1000.0000 mg | ORAL_TABLET | Freq: Once | ORAL | Status: AC
  Administered 2020-03-20: 1000 mg via ORAL
  Filled 2020-03-20: qty 2

## 2020-03-20 MED ORDER — DICLOFENAC SODIUM 1 % EX GEL
4.0000 g | Freq: Four times a day (QID) | CUTANEOUS | 0 refills | Status: DC
Start: 2020-03-20 — End: 2020-06-15

## 2020-03-20 NOTE — ED Triage Notes (Signed)
C/o dull R flank pain since this morning.  Denies urinary complaint, fever, or chills.

## 2020-03-20 NOTE — Discharge Instructions (Addendum)
Your back pain is most likely due to a muscular strain.  There is been a lot of research on back pain, unfortunately the only thing that seems to really help is Tylenol and ibuprofen.  Relative rest is also important to not lift greater than 10 pounds bending or twisting at the waist.  Please follow-up with your family physician.  The other thing that really seems to benefit patients is physical therapy which your doctor may send you for.  Please return to the emergency department for new numbness or weakness to your arms or legs. Difficulty with urinating or urinating or pooping on yourself.  Also if you cannot feel toilet paper when you wipe or get a fever.   Apply the gel I prescribed to the area that hurts Also take tylenol 1000mg (2 extra strength) four times a day.   Your CT showed a cyst in your left scrotum or testicle and the radiologist recommends getting an ultrasound.  Please discuss this with your family doctor or urologist.

## 2020-03-20 NOTE — ED Notes (Signed)
Patient verbalizes understanding of discharge instructions. Opportunity for questioning and answers were provided. Armband removed by staff, pt discharged from ED ambulatory.   

## 2020-03-20 NOTE — ED Provider Notes (Signed)
[facility] EMERGENCY DEPARTMENT Provider Note   CSN: [id number] Arrival date & time: 03/20/20  1520     History Chief Complaint  Patient presents with  . Flank Pain    Greg [name] is a 79 y.o. male.  79 yo M with a chief complaints of right sided low back pain.  Going on for the past couple days.  Denies trauma denies radiation of the pain.  He self caths himself.  Has not noticed any real change in his urine.  Denies abdominal pain denies fevers.  Has pain in the past though usually on the other side.  Denies numbness or weakness to the legs.  Denies numbness to his groin.  The history is provided by the patient.  Flank Pain This is a new problem. The current episode started 2 days ago. The problem occurs constantly. The problem has not changed since onset.Pertinent negatives include no chest pain, no abdominal pain, no headaches and no shortness of breath. The symptoms are aggravated by walking, bending and twisting. Nothing relieves the symptoms. He has tried nothing for the symptoms. The treatment provided no relief.       Past Medical History:  Diagnosis Date  . 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 [name] (benign prostatic hyperplasia)    hyposensitive bladder, nodular prostate with increased PSA (10.23 in 03/2006), s/p prostate biopsy 2006 that was normal (Dr Jeffie Pollock) he recommended TURP.  . Cancer ([place])   . Chronic knee pain    left  . Epididymitis    right  . Frozen shoulder    left frozen shoulder  . Gingivitis   . Hyperlipidemia   . Hypertension   . Right shoulder pain    sx in 06/2005  . Shingles   . Sinus bradycardia     Patient Active Problem List   Diagnosis Date Noted  . 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 Cancer of sigmoid colon ([place]) 03/19/2015  . Allergic rhinitis 03/11/2014  . Healthcare  maintenance 09/08/2012  . HLD (hyperlipidemia) 12/06/2008  . Chronic sinus bradycardia 07/27/2008  . ATONY OF BLADDER 03/18/2008  . Essential hypertension 11/04/2006  . Benign prostatic hyperplasia 11/04/2006    Past Surgical History:  Procedure Laterality Date  . COLOSTOMY N/A 03/12/2015   Procedure: COLOSTOMY;  Surgeon: Judeth Horn, MD;  Location: [place];  Service: General;  Laterality: N/A;  . COLOSTOMY REVISION N/A 03/12/2015   Procedure:  RESECTION SIGMOID COLON;  Surgeon: Judeth Horn, MD;  Location: [place];  Service: General;  Laterality: N/A;  . hydrocelectomy  11/14/2010   left hydrocelectomy  . KNEE SURGERY     L knee  . LAPAROTOMY N/A 03/12/2015   Procedure: EXPLORATORY LAPAROTOMY;  Surgeon: Judeth Horn, MD;  Location: [place];  Service: General;  Laterality: N/A;  . right shoulder arthroscopic surgery  06/2005  . TRANSURETHRAL RESECTION OF PROSTATE     status post TURP 2/2 [name] in 2008  . VASECTOMY         Family History  Problem Relation Age of Onset  . Cancer Father        unknown type cancer     Social History   Tobacco Use  . Smoking status: Former Smoker    Packs/day: 0.50    Years: 25.00    Pack years: 12.50    Types: Cigarettes    Quit  date: 02/07/1988    Years since quitting: 32.1  . Smokeless tobacco: Never Used  Substance Use Topics  . Alcohol use: Yes    Alcohol/week: 0.0 standard drinks    Comment: occasional   . Drug use: No    Home Medications Prior to Admission medications   Medication Sig Start Date End Date Taking? Authorizing Provider  amLODipine (NORVASC) 5 MG tablet Take 1 tablet (5 mg total) by mouth daily. 01/20/20   Velna Ochs, MD  AZO-CRANBERRY PO Take 500 mg by mouth daily. Two softgels daily    [provider]  cetirizine (ZYRTEC) 10 MG tablet Take 1 tablet (10 mg total) by mouth at bedtime. 09/29/18   Velna Ochs, MD  Chlorpheniramine-APAP (CORICIDIN) 2-325 MG TABS Take 1 tablet every 6 (six) hours as needed by  mouth. 09/09/17   Velna Ochs, MD  diclofenac Sodium (VOLTAREN) 1 % GEL Apply 4 g topically 4 (four) times daily. 03/20/20   Deno Etienne, DO  fluticasone (FLONASE) 50 MCG/ACT nasal spray Place 2 sprays into both nostrils daily. Patient not taking: Reported on 01/02/2019 09/29/18 09/29/19  Velna Ochs, MD  pravastatin (PRAVACHOL) 20 MG tablet Take 1 tablet (20 mg total) by mouth daily. 01/20/20   Velna Ochs, MD  sulfamethoxazole-trimethoprim (BACTRIM DS,SEPTRA DS) 800-160 MG tablet Take 1 tablet by mouth daily. 09/24/18   [provider]    Allergies    Ciprofloxacin hcl and Ibuprofen  Review of Systems   Review of Systems  Constitutional: Negative for chills and fever.  HENT: Negative for congestion and facial swelling.   Eyes: Negative for discharge and visual disturbance.  Respiratory: Negative for shortness of breath.   Cardiovascular: Negative for chest pain and palpitations.  Gastrointestinal: Negative for abdominal pain, diarrhea and vomiting.  Genitourinary: Positive for flank pain.  Musculoskeletal: Negative for arthralgias and myalgias.  Skin: Negative for color change and rash.  Neurological: Negative for tremors, syncope and headaches.  Psychiatric/Behavioral: Negative for confusion and dysphoric mood.    Physical Exam Updated Vital Signs BP 140/79 (BP Location: Right Arm)   Pulse 74   Temp 98 F (36.7 C) (Oral)   Resp 16   SpO2 95%   Physical Exam Vitals and nursing note reviewed.  Constitutional:      Appearance: He is well-developed.  HENT:     Head: Normocephalic and atraumatic.  Eyes:     Pupils: Pupils are equal, round, and reactive to light.  Neck:     Vascular: No JVD.  Cardiovascular:     Rate and Rhythm: Normal rate and regular rhythm.     Heart sounds: No murmur. No friction rub. No gallop.   Pulmonary:     Effort: No respiratory distress.     Breath sounds: No wheezing.  Abdominal:     General: There is no distension.      Tenderness: There is no abdominal tenderness. There is no guarding or rebound.  Musculoskeletal:        General: Tenderness present. Normal range of motion.     Cervical back: Normal range of motion and neck supple.     Comments: Tenderness to the iliac crest along the posterior axillary line reproduces the patient's symptoms.  No pain along the piriformis.  No pain along the midline low back.  Pulse motor and sensation are intact to bilateral lower extremities.  Negative straight leg raise test.  No clonus reflexes are 2+ and equal.  Negative Babinski bilaterally.  Skin:    Coloration:  Skin is not pale.     Findings: No rash.  Neurological:     Mental Status: He is alert and oriented to person, place, and time.  Psychiatric:        Behavior: Behavior normal.     ED Results / Procedures / Treatments   Labs (all labs ordered are listed, but only abnormal results are displayed) Labs Reviewed  URINALYSIS, ROUTINE W REFLEX MICROSCOPIC - Abnormal; Notable for the following components:      Result Value   APPearance CLOUDY (*)    Hgb urine dipstick LARGE (*)    Ketones, ur 5 (*)    Protein, ur 30 (*)    Leukocytes,Ua SMALL (*)    Bacteria, UA RARE (*)    All other components within normal limits    EKG None  Radiology No results found.  Procedures Procedures (including critical care time)  Medications Ordered in ED Medications  acetaminophen (TYLENOL) tablet 1,000 mg (1,000 mg Oral Given 03/20/20 1702)  ketorolac (TORADOL) injection 15 mg (15 mg Intramuscular Given 03/20/20 1702)  oxyCODONE (Oxy IR/ROXICODONE) immediate release tablet 2.5 mg (2.5 mg Oral Given 03/20/20 1702)    ED Course  I have reviewed the triage vital signs and the nursing notes.  Pertinent labs & imaging results that were available during my care of the patient were reviewed by me and considered in my medical decision making (see chart for details).    MDM Rules/Calculators/A&P                       79 yo M with a chief complaints of right-sided low back pain.  Nontraumatic.  Will obtain a CT scan to further evaluate.  Patient has had frequent UTIs in the past.  CT scan with no acute intra-abdominal pathology.  No acute L-spine injury.  Incidentally there is a cystic structure localized in the right scrotum or testicle.  We will have him follow-up as an outpatient for that.  Most likely his pain is musculoskeletal.  UA is negative for infection.  PCP follow-up.  7:29 PM:  I have discussed the diagnosis/risks/treatment options with the patient and believe the pt to be eligible for discharge home to follow-up with PCP. We also discussed returning to the ED immediately if new or worsening sx occur. We discussed the sx which are most concerning (e.g., sudden worsening pain, fever, inability to tolerate by mouth) that necessitate immediate return. Medications administered to the patient during their visit and any new prescriptions provided to the patient are listed below.  Medications given during this visit Medications  acetaminophen (TYLENOL) tablet 1,000 mg (1,000 mg Oral Given 03/20/20 1702)  ketorolac (TORADOL) injection 15 mg (15 mg Intramuscular Given 03/20/20 1702)  oxyCODONE (Oxy IR/ROXICODONE) immediate release tablet 2.5 mg (2.5 mg Oral Given 03/20/20 1702)     The patient appears reasonably screen and/or stabilized for discharge and I doubt any other medical condition or other [facility] requiring further screening, evaluation, or treatment in the ED at this time prior to discharge.   Final Clinical Impression(s) / ED Diagnoses Final diagnoses:  Low back pain    Rx / DC Orders ED Discharge Orders         Ordered    diclofenac Sodium (VOLTAREN) 1 % GEL  4 times daily     03/20/20 1924           Orin Eberwein, DO 03/20/20 1929

## 2020-04-06 ENCOUNTER — Encounter: Payer: Medicare Other | Admitting: Internal Medicine

## 2020-05-02 ENCOUNTER — Other Ambulatory Visit: Payer: Self-pay

## 2020-05-02 NOTE — Telephone Encounter (Signed)
Pravastatin RX was sent via [facility] on 01/20/20; 90 day RX with 3 RF.  Patient does not require a new RX and only needs to request a refill at his pharmacy.  TC to patient to inform him of above, no answer, no vm. SChaplin, RN,BSN

## 2020-05-02 NOTE — Telephone Encounter (Signed)
pravastatin (PRAVACHOL) 20 MG tablet, REFILL REQUEST @  [id number] [place], [place] - [address] Phone:  [phone]  Fax:  [phone]

## 2020-06-01 ENCOUNTER — Encounter: Payer: Medicare Other | Admitting: Internal Medicine

## 2020-06-01 ENCOUNTER — Telehealth: Payer: Self-pay | Admitting: *Deleted

## 2020-06-01 NOTE — Telephone Encounter (Signed)
Pt did not come to his appt this am. Called pt - no answer; "call cannot be completed". Unable to leave a message. Appt re-scheduled per front office for 8/25 @ 1015 AM; appt to be mailed to pt.

## 2020-06-15 ENCOUNTER — Encounter: Payer: Self-pay | Admitting: Internal Medicine

## 2020-06-15 ENCOUNTER — Ambulatory Visit (INDEPENDENT_AMBULATORY_CARE_PROVIDER_SITE_OTHER): Payer: Medicare Other | Admitting: Internal Medicine

## 2020-06-15 ENCOUNTER — Encounter: Payer: Medicare Other | Admitting: Internal Medicine

## 2020-06-15 ENCOUNTER — Other Ambulatory Visit: Payer: Self-pay

## 2020-06-15 VITALS — BP 110/85 | HR 53 | Temp 97.7°F | Ht 71.0 in | Wt 183.9 lb

## 2020-06-15 DIAGNOSIS — I10 Essential (primary) hypertension: Secondary | ICD-10-CM

## 2020-06-15 DIAGNOSIS — E785 Hyperlipidemia, unspecified: Secondary | ICD-10-CM

## 2020-06-15 MED ORDER — AMLODIPINE BESYLATE 5 MG PO TABS: 5.0000 mg | ORAL_TABLET | Freq: Every day | ORAL | 3 refills | Status: DC

## 2020-06-15 MED ORDER — PRAVASTATIN SODIUM 20 MG PO TABS: 20.0000 mg | ORAL_TABLET | Freq: Every day | ORAL | 3 refills | Status: DC

## 2020-06-15 NOTE — Assessment & Plan Note (Signed)
BP Readings from Last 3 Encounters:  06/15/20 110/85  03/20/20 131/72  01/02/19 105/62   Patient BP is at goal and reports taking his amlodipine 5 mg daily. Denies lightheaded or dizziness upon standing. Sent refills to the pharmacy .

## 2020-06-15 NOTE — Progress Notes (Signed)
   CC: high blood pressure and high cholesterol   HPI:[name] is a 79 y.o. male who presents for evaluation of hypertension and hyperlipidemia . Please see individual problem based A/P for details.  Depression, PHQ-9: Based on the patients    Office Visit from 06/15/2020 in [place]  PHQ-9 Total Score 1     score we have negative for depression  Past Medical History:  Diagnosis Date  . 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 [name] (benign prostatic hyperplasia)    hyposensitive bladder, nodular prostate with increased PSA (10.23 in 03/2006), s/p prostate biopsy 2006 that was normal (Dr Jeffie Pollock) he recommended TURP.  . Cancer ([place])   . Chronic knee pain    left  . Epididymitis    right  . Frozen shoulder    left frozen shoulder  . Gingivitis   . Hyperlipidemia   . Hypertension   . Right shoulder pain    sx in 06/2005  . Shingles   . Sinus bradycardia    Review of Systems:   Review of Systems  Constitutional: Negative for chills and fever.  Cardiovascular: Negative for chest pain and palpitations.     Physical Exam: Vitals:   06/15/20 1019  BP: 110/85  Pulse: (!) 53  Temp: 97.7 F (36.5 C)  TempSrc: Oral  SpO2: 99%  Weight: 183 lb 14.4 oz (83.4 kg)  Height: 5\' 11"  (1.803 m)   General: NAD, nl appearance HEENT: Normocephalic, atraumatic , Conjunctiva nl  Cardiovascular: Normal rate, regular rhythm.  No murmurs, rubs, or gallops Pulmonary : Equal breath sounds, No wheezes, rales, or rhonchi Abdominal: soft, nontender,  bowel sounds present   Assessment & Plan:   See Encounters Tab for problem based charting.  Patient discussed with Dr. Rebeca Alert

## 2020-06-15 NOTE — Patient Instructions (Signed)
Thank you for trusting me with your care. To recap, today we discussed the following:   1. High blood pressure, at goal  - amLODipine (NORVASC) 5 MG tablet; Take 1 tablet (5 mg total) by mouth daily.  Dispense: 90 tablet; Refill: 3  2. High Cholesterol, at goal  Your cholesterol was checked in 2019 and it is normal now on this medication.   - pravastatin (PRAVACHOL) 20 MG tablet; Take 1 tablet (20 mg total) by mouth daily.  Dispense: 90 tablet; Refill: 3  For assistance in finding a vaccine clinic near you, please call [phone].  Find all vaccine locations throughout the state of [place] at [email or web address]

## 2020-06-15 NOTE — Assessment & Plan Note (Signed)
Reviewed patient lipid panel from 2019, LDL 62, HDL 77.  Assessment: Hyperlipidemia controlled on Pravastatin Plan:  - Continue Pravastatin 20 mg daily, refill sent to pharmacy

## 2020-06-17 NOTE — Progress Notes (Signed)
Internal Medicine Clinic Attending  Case discussed with Dr. Steen at the time of the visit.  We reviewed the resident's history and exam and pertinent patient test results.  I agree with the assessment, diagnosis, and plan of care documented in the resident's note.  Falisha Osment, M.D., Ph.D.  

## 2020-08-03 DIAGNOSIS — R338 Other retention of urine: Secondary | ICD-10-CM | POA: Diagnosis not present

## 2020-09-12 ENCOUNTER — Encounter: Payer: Self-pay | Admitting: *Deleted

## 2020-09-12 NOTE — Progress Notes (Signed)

## 2020-09-19 NOTE — Progress Notes (Signed)
Things That May Be Affecting Your Health:  Alcohol  Hearing loss  Pain    Depression  Home Safety  Sexual Health   Diabetes  Lack of physical activity  Stress   Difficulty with daily activities  Loneliness  Tiredness   Drug use  Medicines  Tobacco use   Falls  Motor Vehicle Safety  Weight   Food choices  Oral Health  Other    YOUR PERSONALIZED HEALTH PLAN : 1. Schedule your next subsequent Medicare Wellness visit in one year 2. Attend all of your regular appointments to address your medical issues 3. Complete the preventative screenings and services   Annual Wellness Visit   Medicare Covered Preventative Screenings and [place] Men and Women Who How Often Need? Date of Last Service Action  Abdominal Aortic Aneurysm Adults with AAA risk factors Once No    Alcohol Misuse and Counseling All Adults Screening once a year if no alcohol misuse. Counseling up to 4 face to face sessions. Yes    Please screen for use / misuse  Bone Density Measurement  Adults at risk for osteoporosis Once every 2 yrs     Lipid Panel Z13.6 All adults without CV disease Once every 5 yrs No 09/29/2018   Colorectal Cancer   Stool sample or  Colonoscopy All adults 50 and older   Once every year  Every 10 years Yes   Personal history of colon cancer with colostomy bag in place (never had it reversed). Unclear when his last colonoscopy was. Need GI records.   Depression All Adults Once a year Yes Today   Diabetes Screening Blood glucose, post glucose load, or GTT Z13.1  All adults at risk  Pre-diabetics  Once per year  Twice per year     Diabetes  Self-Management Training All adults Diabetics 10 hrs first year; 2 hours subsequent years. Requires Copay     Glaucoma  Diabetics  Family history of glaucoma  African Americans 50 yrs +  Hispanic Americans 65 yrs + Annually - requires coppay    Screen for family history. Can offer opthalmology referral if interested.   Hepatitis  C Z72.89 or F19.20  High Risk for HCV  Born between 1945 and 1965  Annually  Once     HIV Z11.4 All adults based on risk  Annually btw ages 46 & 43 regardless of risk  Annually > 65 yrs if at increased risk     Lung Cancer Screening Asymptomatic adults aged 54-77 with 30 pack yr history and current smoker OR quit within the last 15 yrs Annually Must have counseling and shared decision making documentation before first screen     Medical Nutrition Therapy Adults with   Diabetes  Renal disease  Kidney transplant within past 3 yrs 3 hours first year; 2 hours subsequent years     Obesity and Counseling All adults Screening once a year Counseling if BMI 30 or higher  Today   Tobacco Use Counseling Adults who use tobacco  Up to 8 visits in one year     Vaccines Z23  Hepatitis B  Influenza   Pneumonia  Adults   Once  Once every flu season  Two different vaccines separated by one year Yes    Needs flu shot  Next Annual Wellness Visit People with Medicare Every year  Today     Services & Screenings Women Who How Often Need  Date of Last Service Action  Mammogram  Z12.31 Women over  84 One baseline ages 25-39. Annually ager 40 yrs+     Pap tests All women Annually if high risk. Every 2 yrs for normal risk women     Screening for cervical cancer with   Pap (Z01.419 nl or Z01.411abnl) &  HPV Z11.51 Women aged 24 to 18 Once every 5 yrs     Screening pelvic and breast exams All women Annually if high risk. Every 2 yrs for normal risk women     Sexually Transmitted Diseases  Chlamydia  Gonorrhea  Syphilis All at risk adults Annually for non pregnant females at increased risk         [place] Men Who How Ofter Need  Date of Last Service Action  Prostate Cancer - DRE & PSA Men over 50 Annually.  DRE might require a copay.     Sexually Transmitted Diseases  Syphilis All at risk adults Annually for men at increased risk

## 2020-09-20 DIAGNOSIS — R338 Other retention of urine: Secondary | ICD-10-CM | POA: Diagnosis not present

## 2020-11-07 DIAGNOSIS — R338 Other retention of urine: Secondary | ICD-10-CM | POA: Diagnosis not present

## 2020-12-19 DIAGNOSIS — N3020 Other chronic cystitis without hematuria: Secondary | ICD-10-CM | POA: Diagnosis not present

## 2020-12-30 DIAGNOSIS — R338 Other retention of urine: Secondary | ICD-10-CM | POA: Diagnosis not present

## 2021-02-14 DIAGNOSIS — R338 Other retention of urine: Secondary | ICD-10-CM | POA: Diagnosis not present

## 2021-04-25 DIAGNOSIS — Z933 Colostomy status: Secondary | ICD-10-CM | POA: Diagnosis not present

## 2021-06-16 ENCOUNTER — Other Ambulatory Visit: Payer: Self-pay | Admitting: Internal Medicine

## 2021-06-16 DIAGNOSIS — I10 Essential (primary) hypertension: Secondary | ICD-10-CM

## 2021-06-16 DIAGNOSIS — E785 Hyperlipidemia, unspecified: Secondary | ICD-10-CM

## 2021-08-02 ENCOUNTER — Other Ambulatory Visit: Payer: Self-pay

## 2021-08-02 ENCOUNTER — Telehealth: Payer: Medicare Other | Admitting: Internal Medicine

## 2021-08-09 ENCOUNTER — Ambulatory Visit (INDEPENDENT_AMBULATORY_CARE_PROVIDER_SITE_OTHER): Payer: Medicare Other | Admitting: Student

## 2021-08-09 ENCOUNTER — Other Ambulatory Visit: Payer: Self-pay

## 2021-08-09 ENCOUNTER — Encounter: Payer: Self-pay | Admitting: Student

## 2021-08-09 VITALS — BP 127/70 | HR 57 | Temp 97.5°F | Ht 71.0 in | Wt 170.6 lb

## 2021-08-09 DIAGNOSIS — E785 Hyperlipidemia, unspecified: Secondary | ICD-10-CM

## 2021-08-09 DIAGNOSIS — N312 Flaccid neuropathic bladder, not elsewhere classified: Secondary | ICD-10-CM

## 2021-08-09 DIAGNOSIS — R001 Bradycardia, unspecified: Secondary | ICD-10-CM

## 2021-08-09 DIAGNOSIS — I10 Essential (primary) hypertension: Secondary | ICD-10-CM | POA: Diagnosis not present

## 2021-08-09 DIAGNOSIS — C187 Malignant neoplasm of sigmoid colon: Secondary | ICD-10-CM | POA: Diagnosis not present

## 2021-08-09 MED ORDER — AMLODIPINE BESYLATE 5 MG PO TABS: 5.0000 mg | ORAL_TABLET | Freq: Every day | ORAL | 1 refills | Status: DC

## 2021-08-09 MED ORDER — PRAVASTATIN SODIUM 20 MG PO TABS: 20.0000 mg | ORAL_TABLET | Freq: Every day | ORAL | 1 refills | Status: DC

## 2021-08-09 NOTE — Assessment & Plan Note (Signed)
Stable. Patient reports that he does self in and out cath 2 times daily. He denies any dysuria, hematuria, chills or fevers. PSA in 11/2020 down from last year, (7.56<--7.84).  -- Continue to follow urology

## 2021-08-09 NOTE — Assessment & Plan Note (Addendum)
Stable. Patient reports that he does self in and out cath 2 times daily. He denies any dysuria, hematuria, chills or fevers. UA from February 2022 showed dirty urine however urology decided not to treat as patient remained asymptomatic. -- Continue to follow urology -- Continue Bactrim 800-160 mg daily -- Continue Azo cranberry 500 mg daily

## 2021-08-09 NOTE — Assessment & Plan Note (Signed)
Patient's BP remains at goal. BP 127/70 today. CMP 2 years ago showed normal kidney function with mild hypocalcemia. -- Continue amlodipine 5 mg a -- Follow-up repeat CMP

## 2021-08-09 NOTE — Assessment & Plan Note (Signed)
Patient continues to be asymptomatic. Heart rate 57 in clinic today. --Continue to monitor

## 2021-08-09 NOTE — Assessment & Plan Note (Signed)
Stable. On exam, stoma remains pink and there is no stool in colostomy bag. Follows with oncology every 6 months for surveillance. Continues to be hesitant about reversal surgery. We will continue to monitor. -- Continue to follow oncology

## 2021-08-09 NOTE — Patient Instructions (Signed)
Thank you, Greg [name] for allowing [place] to provide your care today. Today we discussed your colon cancer, blood pressure and cholesterol.  We are glad that you are doing well overall.  We encourage you to get your COVID vaccines at the pharmacy near you.  He received a flu vaccine today.  Continue to follow-up with your oncologist and urologist.  I have ordered the following labs for you:  Lab Orders         CMP14 + Anion Gap         Lipid Profile       I will call if any are abnormal. All of your labs can be accessed through "My Chart".  My Chart Access: [email or web address]  Please follow-up in 6 months  Please make sure to arrive 15 minutes prior to your next appointment. If you arrive late, you may be asked to reschedule.    We look forward to seeing you next time. Please call our clinic at [phone] if you have any questions or concerns. The best time to call is Monday-Friday from 9am-4pm, but there is someone available 24/7. If after hours or the weekend, call the main hospital number and ask for the Internal Medicine Resident On-Call. If you need medication refills, please notify your pharmacy one week in advance and they will send [place] a request.   Thank you for letting us take part in your care. Wishing you the best!  Lacinda Axon, MD 08/09/2021, 10:16 AM IM Resident, PGY-2 [name] 41:10

## 2021-08-09 NOTE — Progress Notes (Signed)
   CC: Follow up  HPI:  [name] is a 80 y.o. male with PMH as below who presents to clinic for follow-up on his chronic medical problems. Please see problem based charting for evaluation, assessment and plan.  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   Cancer [facility])    Chronic knee pain    left   Epididymitis    right   Frozen shoulder    left frozen shoulder   Gingivitis    Hyperlipidemia    Hypertension    Right shoulder pain    sx in 06/2005   Shingles    Sinus bradycardia     Review of Systems:  Constitutional: Negative for fever or fatigue Respiratory: Negative for shortness of breath Cardiac: Negative for chest pain Abdomen: Negative for abdominal pain, bloody stools, N/V, constipation or diarrhea GU: Negative for dysuria or frequent urination Neuro: Negative for headache or weakness  Physical Exam: General: Pleasant, well-appearing elderly male.  No acute distress. Cardiac: RRR. No murmurs, rubs or gallops. No LE edema Respiratory: Lungs CTAB. No wheezing or crackles. Abdominal: Soft, NT/ND. Normal BS. Stoma pink, no stool in colostomy bag. Extremities: Atraumatic. Full ROM. Palpable radial and DP pulses. Neuro: A&O x 3. Moves all extremities   Vitals:   08/09/21 0947  BP: 127/70  Pulse: (!) 57  Temp: (!) 97.5 F (36.4 C)  TempSrc: Oral  SpO2: 100%  Weight: 170 lb 9.6 oz (77.4 kg)  Height: 5\' 11"  (1.803 m)     Assessment & Plan:   See Encounters Tab for problem based charting.  Patient discussed with Dr. Lorenz Coaster, MD, MPH

## 2021-08-09 NOTE — Assessment & Plan Note (Addendum)
Controlled on low intensity statin.  Last lipid panel in 2019 showed LDL 62.  -- Repeat lipid panel -- Continue pravastatin 20 mg daily -- Advised to continue lifestyle modifications with exercise and dietary changes

## 2021-08-10 LAB — CMP14 + ANION GAP
ALT: 7 IU/L (ref 0–44)
AST: 17 IU/L (ref 0–40)
Albumin/Globulin Ratio: 1.8 (ref 1.2–2.2)
Albumin: 4.6 g/dL (ref 3.6–4.6)
Alkaline Phosphatase: 86 IU/L (ref 44–121)
Anion Gap: 14 mmol/L (ref 10.0–18.0)
BUN/Creatinine Ratio: 15 (ref 10–24)
BUN: 17 mg/dL (ref 8–27)
Bilirubin Total: 0.3 mg/dL (ref 0.0–1.2)
CO2: 25 mmol/L (ref 20–29)
Calcium: 9 mg/dL (ref 8.6–10.2)
Chloride: 101 mmol/L (ref 96–106)
Creatinine, Ser: 1.17 mg/dL (ref 0.76–1.27)
Globulin, Total: 2.6 g/dL (ref 1.5–4.5)
Glucose: 63 mg/dL — ABNORMAL LOW (ref 70–99)
Potassium: 4.8 mmol/L (ref 3.5–5.2)
Sodium: 140 mmol/L (ref 134–144)
Total Protein: 7.2 g/dL (ref 6.0–8.5)
eGFR: 63 mL/min/{1.73_m2} (ref >59)

## 2021-08-10 LAB — LIPID PANEL
Chol/HDL Ratio: 2 ratio (ref 0.0–5.0)
Cholesterol, Total: 168 mg/dL (ref 100–199)
HDL: 82 mg/dL (ref >39)
LDL Chol Calc (NIH): 77 mg/dL (ref 0–99)
Triglycerides: 39 mg/dL (ref 0–149)
VLDL Cholesterol Cal: 9 mg/dL (ref 5–40)

## 2021-08-14 NOTE — Progress Notes (Signed)
Internal Medicine Clinic Attending  Case discussed with Dr. [place]  At the time of the visit.  We reviewed the resident's history and exam and pertinent patient test results.  I agree with the assessment, diagnosis, and plan of care documented in the resident's note.  

## 2021-08-16 DIAGNOSIS — Z933 Colostomy status: Secondary | ICD-10-CM | POA: Diagnosis not present

## 2021-09-12 ENCOUNTER — Other Ambulatory Visit: Payer: Self-pay

## 2021-09-12 ENCOUNTER — Encounter (HOSPITAL_BASED_OUTPATIENT_CLINIC_OR_DEPARTMENT_OTHER): Payer: Self-pay

## 2021-09-12 ENCOUNTER — Emergency Department (HOSPITAL_BASED_OUTPATIENT_CLINIC_OR_DEPARTMENT_OTHER): Payer: Medicare Other

## 2021-09-12 ENCOUNTER — Emergency Department (HOSPITAL_BASED_OUTPATIENT_CLINIC_OR_DEPARTMENT_OTHER)
Admission: EM | Admit: 2021-09-12 | Discharge: 2021-09-12 | Disposition: A | Discharge status: Home / Self Care | Payer: Medicare Other | Attending: Emergency Medicine | Admitting: Emergency Medicine

## 2021-09-12 DIAGNOSIS — Z85038 Personal history of other malignant neoplasm of large intestine: Secondary | ICD-10-CM | POA: Insufficient documentation

## 2021-09-12 DIAGNOSIS — Z79899 Other long term (current) drug therapy: Secondary | ICD-10-CM | POA: Diagnosis not present

## 2021-09-12 DIAGNOSIS — W01198A Fall on same level from slipping, tripping and stumbling with subsequent striking against other object, initial encounter: Secondary | ICD-10-CM | POA: Diagnosis not present

## 2021-09-12 DIAGNOSIS — Z87891 Personal history of nicotine dependence: Secondary | ICD-10-CM | POA: Insufficient documentation

## 2021-09-12 DIAGNOSIS — Z23 Encounter for immunization: Secondary | ICD-10-CM | POA: Insufficient documentation

## 2021-09-12 DIAGNOSIS — I10 Essential (primary) hypertension: Secondary | ICD-10-CM | POA: Insufficient documentation

## 2021-09-12 DIAGNOSIS — S0101XA Laceration without foreign body of scalp, initial encounter: Secondary | ICD-10-CM | POA: Diagnosis not present

## 2021-09-12 DIAGNOSIS — S0990XA Unspecified injury of head, initial encounter: Secondary | ICD-10-CM | POA: Diagnosis present

## 2021-09-12 MED ORDER — TETANUS-DIPHTH-ACELL PERTUSSIS 5-2.5-18.5 LF-MCG/0.5 IM SUSY
0.5000 mL | PREFILLED_SYRINGE | Freq: Once | INTRAMUSCULAR | Status: AC
  Administered 2021-09-12: 0.5 mL via INTRAMUSCULAR
  Filled 2021-09-12: qty 0.5

## 2021-09-12 NOTE — Discharge Instructions (Addendum)
Your staples need to be removed in 7 to 10 days.  That can be done at your primary care doctor's office or emergency department.  Use gentle soap and water to laceration area.  Do not scrub vigorously.

## 2021-09-12 NOTE — ED Triage Notes (Signed)
From home via EMS from home s/p fall.  Hit head on wall  Denies LOC.  Lac noted to back right side head.  Not on blood thinners

## 2021-09-12 NOTE — ED Notes (Signed)
Patient transported to CT 

## 2021-09-12 NOTE — ED Provider Notes (Signed)
[place] EMERGENCY DEPT Provider Note   CSN: [id number] Arrival date & time: 09/12/21  1640     History Chief Complaint  Patient presents with   Lytle Michaels    Greg [name] is a 79 y.o. male.  Chemical fall prior to arrival.  Laceration to the back of his head.  Not on blood thinners.  Ambulatory after fall.  Was try to get up from his couch using a nearby piece of furniture that fell over which caused him to fall and hit the back of his head.  No loss of consciousness.  The history is provided by the patient.  Fall This is a new problem. The current episode started less than 1 hour ago. The problem has been resolved. Pertinent negatives include no chest pain, no abdominal pain, no headaches and no shortness of breath. Nothing aggravates the symptoms. Nothing relieves the symptoms. He has tried nothing for the symptoms. The treatment provided no relief.      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   Cancer of sigmoid colon ([place]) 03/19/2015   Allergic rhinitis 03/11/2014   Healthcare maintenance 09/08/2012   HLD (hyperlipidemia) 12/06/2008   Chronic sinus bradycardia 07/27/2008   ATONY OF BLADDER 03/18/2008   Essential hypertension 11/04/2006   Benign prostatic hyperplasia  11/04/2006    Past Surgical History:  Procedure Laterality Date   COLOSTOMY N/A 03/12/2015   Procedure: COLOSTOMY;  Surgeon: Judeth Horn, MD;  Location: [place];  Service: General;  Laterality: N/A;   COLOSTOMY REVISION N/A 03/12/2015   Procedure:  RESECTION SIGMOID COLON;  Surgeon: Judeth Horn, MD;  Location: [place];  Service: General;  Laterality: N/A;   hydrocelectomy  11/14/2010   left hydrocelectomy   KNEE SURGERY     L knee   LAPAROTOMY N/A 03/12/2015   Procedure: EXPLORATORY LAPAROTOMY;  Surgeon: Judeth Horn, MD;  Location: [place];  Service: General;  Laterality: N/A;   right shoulder arthroscopic surgery  06/2005   TRANSURETHRAL RESECTION OF PROSTATE     status post TURP 2/2 [name] in 2008   VASECTOMY         Family History  Problem Relation Age of Onset   Cancer Father        unknown type cancer     Social History   Tobacco Use   Smoking status: Former    Packs/day: 0.50    Years: 25.00    Pack years: 12.50    Types: Cigarettes    Quit date: 02/07/1988    Years since quitting: 33.6   Smokeless tobacco: Never  Vaping Use   Vaping Use: Never used  Substance Use Topics   Alcohol use: Yes  Alcohol/week: 0.0 standard drinks    Comment: occasional    Drug use: No    Home Medications Prior to Admission medications   Medication Sig Start Date End Date Taking? Authorizing Provider  amLODipine (NORVASC) 5 MG tablet Take 1 tablet (5 mg total) by mouth daily. 08/09/21   Lacinda Axon, MD  AZO-CRANBERRY PO Take 500 mg by mouth daily. Two softgels daily    [provider]  cetirizine (ZYRTEC) 10 MG tablet Take 1 tablet (10 mg total) by mouth at bedtime. 09/29/18   Velna Ochs, MD  pravastatin (PRAVACHOL) 20 MG tablet Take 1 tablet (20 mg total) by mouth daily. 08/09/21   Lacinda Axon, MD  sulfamethoxazole-trimethoprim (BACTRIM DS,SEPTRA DS) 800-160 MG tablet Take 1 tablet by mouth daily. 09/24/18   [provider]    Allergies     Ciprofloxacin hcl and Ibuprofen  Review of Systems   Review of Systems  Constitutional:  Negative for chills and fever.  HENT:  Negative for ear pain and sore throat.   Eyes:  Negative for pain and visual disturbance.  Respiratory:  Negative for cough and shortness of breath.   Cardiovascular:  Negative for chest pain and palpitations.  Gastrointestinal:  Negative for abdominal pain and vomiting.  Genitourinary:  Negative for dysuria and hematuria.  Musculoskeletal:  Negative for arthralgias and back pain.  Skin:  Positive for wound. Negative for color change and rash.  Neurological:  Negative for seizures, syncope and headaches.  All other systems reviewed and are negative.  Physical Exam Updated Vital Signs BP 135/70 (BP Location: Right Arm)   Pulse 61   Temp 98 F (36.7 C) (Oral)   Resp 18   Ht 5\' 11"  (1.803 m)   Wt 81.6 kg   SpO2 99%   BMI 25.10 kg/m   Physical Exam Vitals and nursing note reviewed.  Constitutional:      General: He is not in acute distress.    Appearance: He is well-developed. He is not ill-appearing.  HENT:     Head: Normocephalic and atraumatic.     Nose: Nose normal.     Mouth/Throat:     Mouth: Mucous membranes are moist.  Eyes:     Extraocular Movements: Extraocular movements intact.     Conjunctiva/sclera: Conjunctivae normal.     Pupils: Pupils are equal, round, and reactive to light.  Cardiovascular:     Rate and Rhythm: Normal rate and regular rhythm.     Heart sounds: No murmur heard. Pulmonary:     Effort: Pulmonary effort is normal. No respiratory distress.     Breath sounds: Normal breath sounds.  Abdominal:     General: Abdomen is flat.     Palpations: Abdomen is soft.     Tenderness: There is no abdominal tenderness.  Musculoskeletal:        General: No swelling. Normal range of motion.     Cervical back: Neck supple.  Skin:    General: Skin is warm and dry.     Capillary Refill: Capillary refill takes less than 2  seconds.     Comments: 3 cm laceration to the back of the head that is hemostatic  Neurological:     General: No focal deficit present.     Mental Status: He is alert and oriented to person, place, and time.     Cranial Nerves: No cranial nerve deficit.     Sensory: No sensory deficit.     Motor: No weakness.  Coordination: Coordination normal.  Psychiatric:        Mood and Affect: Mood normal.    ED Results / Procedures / Treatments   Labs (all labs ordered are listed, but only abnormal results are displayed) Labs Reviewed - No data to display  EKG None  Radiology CT Head Wo Contrast  Result Date: 09/12/2021 CLINICAL DATA:  Head trauma, mod-severe fall EXAM: CT HEAD WITHOUT CONTRAST TECHNIQUE: Contiguous axial images were obtained from the base of the skull through the vertex without intravenous contrast. COMPARISON:  None. FINDINGS: Brain: There is no acute intracranial hemorrhage, mass effect, or edema. Gray-white differentiation is preserved. There is no extra-axial fluid collection. Ventricles and sulci are within normal limits in size and configuration. Vascular: There is atherosclerotic calcification at the skull base. Skull: Calvarium is unremarkable. Sinuses/Orbits: No acute finding. Other: None. IMPRESSION: No evidence of acute intracranial injury. Electronically Signed   By: Macy Mis M.D.   On: 09/12/2021 18:07   CT Cervical Spine Wo Contrast  Result Date: 09/12/2021 CLINICAL DATA:  Polytrauma, critical, head/C-spine injury suspected fall EXAM: CT CERVICAL SPINE WITHOUT CONTRAST TECHNIQUE: Multidetector CT imaging of the cervical spine was performed without intravenous contrast. Multiplanar CT image reconstructions were also generated. COMPARISON:  None. FINDINGS: Alignment: Mild multilevel degenerative listhesis. Skull base and vertebrae: Vertebral body heights are maintained apart from degenerative endplate irregularity. There is no acute fracture. Bulky anterior  osteophytes are present particularly at C5-C6 and C6-C7. Soft tissues and spinal canal: No prevertebral fluid or swelling. No visible canal hematoma. Disc levels: Multilevel degenerative changes are present including disc space narrowing, endplate osteophytes, and facet and uncovertebral hypertrophy. There is multilevel significant canal and foraminal stenosis. Upper chest: No apical lung mass. Other: None. IMPRESSION: No acute cervical spine fracture. Electronically Signed   By: Macy Mis M.D.   On: 09/12/2021 18:26    Procedures .[place] KitchenLaceration Repair  Date/Time: 09/12/2021 5:13 PM Performed by: Lennice Sites, DO Authorized by: Lennice Sites, DO   Consent:    Consent obtained:  Verbal   Consent given by:  Patient   Risks, benefits, and alternatives were discussed: yes     Risks discussed:  Infection, need for additional repair, nerve damage, poor wound healing, pain, poor cosmetic result, retained foreign body, tendon damage and vascular damage   Alternatives discussed:  No treatment Universal protocol:    Procedure explained and questions answered to patient or proxy's satisfaction: no     Relevant documents present and verified: no     Test results available: no     Patient identity confirmed:  Verbally with patient Anesthesia:    Anesthesia method:  None Laceration details:    Location:  Scalp   Scalp location:  Occipital   Length (cm):  3   Depth (mm):  1 Pre-procedure details:    Preparation:  Patient was prepped and draped in usual sterile fashion and imaging obtained to evaluate for foreign bodies Exploration:    Limited defect created (wound extended): no     Imaging outcome: foreign body not noted     Wound exploration: wound explored through full range of motion and entire depth of wound visualized     Wound extent: no areolar tissue violation noted, no fascia violation noted, no foreign bodies/material noted, no muscle damage noted, no nerve damage noted, no tendon  damage noted, no underlying fracture noted and no vascular damage noted     Contaminated: no   Treatment:    Area cleansed with:  Shur-Clens   Amount of cleaning:  Standard   Visualized foreign bodies/material removed: no   Skin repair:    Repair method:  Staples   Number of staples:  2 Approximation:    Approximation:  Close Repair type:    Repair type:  Simple Post-procedure details:    Dressing:  Open (no dressing)   Procedure completion:  Tolerated   Medications Ordered in ED Medications  Tdap (BOOSTRIX) injection 0.5 mL (0.5 mLs Intramuscular Given 09/12/21 1731)    ED Course  I have reviewed the triage vital signs and the nursing notes.  Pertinent labs & imaging results that were available during my care of the patient were reviewed by me and considered in my medical decision making (see chart for details).    MDM Rules/Calculators/A&P                           Greg [name] is here after mechanical fall where he hit the back of his head with laceration.  Tetanus shot updated.  No loss consciousness.  No other pain elsewhere.  Head and neck CT have been ordered.  Laceration repaired with staples.  Overall appears well.  CT is negative for injuries.  Discharged in good condition.  This chart was dictated using voice recognition software.  Despite best efforts to proofread,  errors can occur which can change the documentation meaning.   Final Clinical Impression(s) / ED Diagnoses Final diagnoses:  Laceration of scalp, initial encounter    Rx / DC Orders ED Discharge Orders     None        Lennice Sites, DO 09/12/21 1830

## 2021-09-20 ENCOUNTER — Emergency Department (HOSPITAL_COMMUNITY)
Admission: EM | Admit: 2021-09-20 | Discharge: 2021-09-20 | Disposition: A | Discharge status: Home / Self Care | Payer: Medicare Other | Attending: Emergency Medicine | Admitting: Emergency Medicine

## 2021-09-20 ENCOUNTER — Encounter (HOSPITAL_COMMUNITY): Payer: Self-pay | Admitting: Emergency Medicine

## 2021-09-20 ENCOUNTER — Other Ambulatory Visit: Payer: Self-pay

## 2021-09-20 DIAGNOSIS — I10 Essential (primary) hypertension: Secondary | ICD-10-CM | POA: Diagnosis not present

## 2021-09-20 DIAGNOSIS — Z87891 Personal history of nicotine dependence: Secondary | ICD-10-CM | POA: Insufficient documentation

## 2021-09-20 DIAGNOSIS — Z79899 Other long term (current) drug therapy: Secondary | ICD-10-CM | POA: Insufficient documentation

## 2021-09-20 DIAGNOSIS — Z4802 Encounter for removal of sutures: Secondary | ICD-10-CM | POA: Diagnosis present

## 2021-09-20 NOTE — Discharge Instructions (Signed)
Follow up with primary care provider as needed.

## 2021-09-20 NOTE — ED Provider Notes (Signed)
[place] EMERGENCY DEPARTMENT Provider Note   CSN: [id number] Arrival date & time: 09/20/21  1057     History Chief Complaint  Patient presents with   Suture / Staple Removal    Greg [name] is a 80 y.o. male here for evaluation of suture removal.  Had mechanical fall approximately 8 days ago.  Seen here in the ED had reassuring work-up however had laceration to right side of his head.  Wound has been healing.  No drainage, bleeding, headache, numbness, weakness, redness or warmth to wound.  Denies any complaints.  No pain.  History obtained from patient and past medical records.  No interpreter is used.  HPI     Past Medical History:  Diagnosis Date   Atony of bladder    followed at [facility]; CIC 3x/day, has been treated for culture proven serratia marcesens 05/2011, 06/2011, 07/2011, 09/2011   [name] (benign prostatic hyperplasia)    hyposensitive bladder, nodular prostate with increased PSA (10.23 in 03/2006), s/p prostate biopsy 2006 that was normal (Dr Jeffie Pollock) he recommended TURP.   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   Cancer of sigmoid colon ([place]) 03/19/2015   Allergic rhinitis 03/11/2014   Healthcare maintenance 09/08/2012   HLD (hyperlipidemia) 12/06/2008   Chronic sinus bradycardia 07/27/2008   ATONY OF BLADDER 03/18/2008   Essential hypertension 11/04/2006   Benign prostatic hyperplasia 11/04/2006    Past Surgical History:  Procedure Laterality Date   COLOSTOMY N/A 03/12/2015   Procedure: COLOSTOMY;  Surgeon: Judeth Horn, MD;  Location: [place];  Service: General;  Laterality: N/A;   COLOSTOMY REVISION N/A 03/12/2015    Procedure:  RESECTION SIGMOID COLON;  Surgeon: Judeth Horn, MD;  Location: [place];  Service: General;  Laterality: N/A;   hydrocelectomy  11/14/2010   left hydrocelectomy   KNEE SURGERY     L knee   LAPAROTOMY N/A 03/12/2015   Procedure: EXPLORATORY LAPAROTOMY;  Surgeon: Judeth Horn, MD;  Location: [place];  Service: General;  Laterality: N/A;   right shoulder arthroscopic surgery  06/2005   TRANSURETHRAL RESECTION OF PROSTATE     status post TURP 2/2 [name] in 2008   VASECTOMY         Family History  Problem Relation Age of Onset   Cancer Father        unknown type cancer     Social History   Tobacco Use   Smoking status: Former    Packs/day: 0.50    Years: 25.00    Pack years: 12.50    Types: Cigarettes    Quit date: 02/07/1988    Years since quitting: 33.6   Smokeless tobacco: Never  Vaping Use   Vaping Use: Never used  Substance Use Topics   Alcohol use: Yes    Alcohol/week: 0.0 standard drinks    Comment: occasional    Drug use: No    Home Medications Prior to Admission medications   Medication Sig Start Date End Date Taking? Authorizing Provider  amLODipine (NORVASC) 5 MG tablet Take 1 tablet (5  mg total) by mouth daily. 08/09/21   Lacinda Axon, MD  AZO-CRANBERRY PO Take 500 mg by mouth daily. Two softgels daily    [provider]  cetirizine (ZYRTEC) 10 MG tablet Take 1 tablet (10 mg total) by mouth at bedtime. 09/29/18   Velna Ochs, MD  pravastatin (PRAVACHOL) 20 MG tablet Take 1 tablet (20 mg total) by mouth daily. 08/09/21   Lacinda Axon, MD  sulfamethoxazole-trimethoprim (BACTRIM DS,SEPTRA DS) 800-160 MG tablet Take 1 tablet by mouth daily. 09/24/18   [provider]    Allergies    Ciprofloxacin hcl and Ibuprofen  Review of Systems   Review of Systems  Constitutional: Negative.   HENT: Negative.    Respiratory: Negative.    Cardiovascular: Negative.   Gastrointestinal: Negative.   Genitourinary: Negative.    Musculoskeletal: Negative.   Skin:  Positive for wound.  Neurological: Negative.   All other systems reviewed and are negative.  Physical Exam Updated Vital Signs BP 134/74   Pulse (!) 50   Temp 98.9 F (37.2 C)   Resp 20   SpO2 96%   Physical Exam Vitals and nursing note reviewed.  Constitutional:      General: He is not in acute distress.    Appearance: He is well-developed. He is not ill-appearing, toxic-appearing or diaphoretic.  HENT:     Head: Normocephalic and atraumatic.     Jaw: There is normal jaw occlusion.     Comments: Nontender, full range of motion    Nose: Nose normal.  Eyes:     Pupils: Pupils are equal, round, and reactive to light.  Cardiovascular:     Rate and Rhythm: Normal rate and regular rhythm.  Pulmonary:     Effort: Pulmonary effort is normal. No respiratory distress.  Abdominal:     General: There is no distension.     Palpations: Abdomen is soft.  Musculoskeletal:        General: Normal range of motion.     Cervical back: Normal range of motion and neck supple.  Skin:    General: Skin is warm and dry.     Capillary Refill: Capillary refill takes less than 2 seconds.     Comments: Well-healing laceration left scalp, # 2 staples.  Surrounding erythema, warmth, drainage  Neurological:     General: No focal deficit present.     Mental Status: He is alert and oriented to person, place, and time.   ED Results / Procedures / Treatments   Labs (all labs ordered are listed, but only abnormal results are displayed) Labs Reviewed - No data to display  EKG None  Radiology No results found.  Procedures .Suture Removal  Date/Time: 09/20/2021 12:55 PM Performed by: Nettie Elm, PA-C Authorized by: Nettie Elm, PA-C   Consent:    Consent obtained:  Verbal   Consent given by:  Patient   Risks, benefits, and alternatives were discussed: yes     Risks discussed:  Bleeding, pain and wound separation   Alternatives discussed:   No treatment, alternative treatment, observation and referral Universal protocol:    Procedure explained and questions answered to patient or proxy's satisfaction: yes     Relevant documents present and verified: yes     Test results available: yes     Imaging studies available: yes     Required blood products, implants, devices, and special equipment available: yes     Site/side marked: yes     Immediately prior to  procedure, a time out was called: yes     Patient identity confirmed:  Verbally with patient Location:    Location:  [place] location:  Scalp Procedure details:    Wound appearance:  No signs of infection, good wound healing and clean   Number of sutures removed:  0   Number of staples removed:  2 Post-procedure details:    Post-removal:  No dressing applied   Procedure completion:  Tolerated well, no immediate complications   Medications Ordered in ED Medications - No data to display  ED Course  I have reviewed the triage vital signs and the nursing notes.  Pertinent labs & imaging results that were available during my care of the patient were reviewed by me and considered in my medical decision making (see chart for details).  Here for evaluation of staple removal.  Occurred 1 week ago after mechanical fall.  Had reassuring exam at that time.  He has #2 staples to left scalp.  No active bleeding or drainage.   Procedure tolerated well. Vitals normal, no signs of infection. Scar minimization & return precautions given at dc.   The patient has been appropriately medically screened and/or stabilized in the ED. I have low suspicion for any other emergent medical condition which would require further screening, evaluation or treatment in the ED or require inpatient management.  Patient is hemodynamically stable and in no acute distress.  Patient able to ambulate in department prior to ED.  Evaluation does not show acute pathology that would require ongoing  or additional emergent interventions while in the emergency department or further inpatient treatment.  I have discussed the diagnosis with the patient and answered all questions.  Pain is been managed while in the emergency department and patient has no further complaints prior to discharge.  Patient is comfortable with plan discussed in room and is stable for discharge at this time.  I have discussed strict return precautions for returning to the emergency department.  Patient was encouraged to follow-up with PCP/specialist refer to at discharge.      MDM Rules/Calculators/A&P                            Final Clinical Impression(s) / ED Diagnoses Final diagnoses:  Encounter for staple removal    Rx / DC Orders ED Discharge Orders     None        Raylin Diguglielmo A, PA-C 09/20/21 [place], [place], DO 09/20/21 1333

## 2021-09-20 NOTE — ED Triage Notes (Signed)
Pt here to have staples removed from head.  States they have been in 8 days.  Denies any problems with them.

## 2021-11-15 ENCOUNTER — Encounter: Payer: Medicare Other | Admitting: Internal Medicine

## 2021-11-29 DIAGNOSIS — Z933 Colostomy status: Secondary | ICD-10-CM | POA: Diagnosis not present

## 2021-11-29 DIAGNOSIS — R339 Retention of urine, unspecified: Secondary | ICD-10-CM | POA: Diagnosis not present

## 2021-12-27 ENCOUNTER — Encounter: Payer: Medicare Other | Admitting: Internal Medicine

## 2022-01-10 ENCOUNTER — Encounter: Payer: Self-pay | Admitting: Internal Medicine

## 2022-01-10 ENCOUNTER — Other Ambulatory Visit: Payer: Self-pay

## 2022-01-10 ENCOUNTER — Ambulatory Visit (INDEPENDENT_AMBULATORY_CARE_PROVIDER_SITE_OTHER): Payer: Medicare Other | Admitting: Internal Medicine

## 2022-01-10 DIAGNOSIS — N312 Flaccid neuropathic bladder, not elsewhere classified: Secondary | ICD-10-CM

## 2022-01-10 DIAGNOSIS — I10 Essential (primary) hypertension: Secondary | ICD-10-CM

## 2022-01-10 DIAGNOSIS — C187 Malignant neoplasm of sigmoid colon: Secondary | ICD-10-CM | POA: Diagnosis not present

## 2022-01-10 DIAGNOSIS — E785 Hyperlipidemia, unspecified: Secondary | ICD-10-CM

## 2022-01-10 DIAGNOSIS — R001 Bradycardia, unspecified: Secondary | ICD-10-CM

## 2022-01-10 MED ORDER — PRAVASTATIN SODIUM 20 MG PO TABS: 20.0000 mg | ORAL_TABLET | Freq: Every day | ORAL | 2 refills | Status: DC

## 2022-01-10 MED ORDER — AMLODIPINE BESYLATE 5 MG PO TABS: 5.0000 mg | ORAL_TABLET | Freq: Every day | ORAL | 2 refills | Status: DC

## 2022-01-10 NOTE — Assessment & Plan Note (Signed)
Patient's BP remains at goal. BP 126/77 today. Great med compliance and tolerating it well without adverse effects.  ? ?-- Continue amlodipine 5 mg daily  ?

## 2022-01-10 NOTE — Assessment & Plan Note (Signed)
Patient continues to be asymptomatic. Heart rate 56 in clinic today. ?--Continue to monitor ?

## 2022-01-10 NOTE — Progress Notes (Signed)
? ?CC: routine clinic follow up  ? ?HPI: ? ?[name] is a 81 y.o. male with a PMHx stated below and presents today for stated above. Please see the Encounters tab for problem-based Assessment & Plan for additional details.  ? ?Past Medical History:  ?Diagnosis Date  ? Atony of bladder   ? followed at [facility]; CIC 3x/day, has been treated for culture proven serratia marcesens 05/2011, 06/2011, 07/2011, 09/2011  ? [name] (benign prostatic hyperplasia)   ? hyposensitive bladder, nodular prostate with increased PSA (10.23 in 03/2006), s/p prostate biopsy 2006 that was normal (Dr Greg [name]) he recommended TURP.  ? Cancer [facility])   ? Chronic knee pain   ? left  ? Epididymitis   ? right  ? Frozen shoulder   ? left frozen shoulder  ? Gingivitis   ? Hyperlipidemia   ? Hypertension   ? Right shoulder pain   ? sx in 06/2005  ? Shingles   ? Sinus bradycardia   ? ? ?Current Outpatient Medications on File Prior to Visit  ?Medication Sig Dispense Refill  ? amLODipine (NORVASC) 5 MG tablet Take 1 tablet (5 mg total) by mouth daily. 90 tablet 1  ? AZO-CRANBERRY PO Take 500 mg by mouth daily. Two softgels daily    ? cetirizine (ZYRTEC) 10 MG tablet Take 1 tablet (10 mg total) by mouth at bedtime. 30 tablet 5  ? pravastatin (PRAVACHOL) 20 MG tablet Take 1 tablet (20 mg total) by mouth daily. 90 tablet 1  ? sulfamethoxazole-trimethoprim (BACTRIM DS,SEPTRA DS) 800-160 MG tablet Take 1 tablet by mouth daily.    ? ?No current facility-administered medications on file prior to visit.  ? ? ?Family History  ?Problem Relation Age of Onset  ? Cancer Father   ?     unknown type cancer   ? ? ?Social History  ? ?Socioeconomic History  ? Marital status: Single  ?  Spouse name: Not on file  ? Number of children: Not on file  ? Years of education: 59  ? Highest education level: Not on file  ?Occupational History  ?  Employer: RETIRED  ?  Comment: retired  ?Tobacco Use  ? Smoking status: Former  ?  Packs/day: 0.50  ?  Years: 25.00  ?  Pack  years: 12.50  ?  Types: Cigarettes  ?  Quit date: 02/07/1988  ?  Years since quitting: 33.9  ? Smokeless tobacco: Never  ?Vaping Use  ? Vaping Use: Never used  ?Substance and Sexual Activity  ? Alcohol use: Yes  ?  Alcohol/week: 0.0 standard drinks  ?  Comment: occasional   ? Drug use: No  ? Sexual activity: Not on file  ?  Comment: condoms   ?Other Topics Concern  ? Not on file  ?Social History Narrative  ? Single, lives alone in apartment  ? No children  ? Several local siblings in area-will take him to grocery store, etc  ? Rides bus (75cents/trip) and bus is right outside his house  ? Prior employment recycle [employer]  ? Reports strong faith helps him cope with life  ? Has had to self-cath twice daily since 2008 -Dr. Jeffie [name]  ? [facility] for ostomy care and supplies  ? Ambulates w/walker-independent in [place]-cooks pot pies and frozen dinners  ?   ? Current Social History 01/02/2019    ?   ? Patient lives alone in an apartment which is 1 story. There are not steps up to the entrance the  patient uses.   ?   ? Patient's method of transportation is city bus.  ?   ? The highest level of education was some high school.  ?   ? The patient is currently retired.  ?   ? Identified important Relationships are  "God, I got it right.  I also go riding with my brother sometimes".   ?    ? Interests / Fun: "I love going to football and basketball games, mainly high school and college.  I've also been to some pro games and met Alvy Beal and Shak".   ?   ? Current Stressors: "I do not have any stressors in my life right now".   ?   ? Religious / Personal Beliefs: I believe in God"   ? ?Social Determinants of Health  ? ?Financial Resource Strain: Not on file  ?Food Insecurity: Not on file  ?Transportation Needs: Not on file  ?Physical Activity: Not on file  ?Stress: Not on file  ?Social Connections: Not on file  ?Intimate Partner Violence: Not on file  ? ? ?Review of Systems: ?ROS negative except for what is noted on the  assessment and plan. ? ?Vitals:  ? 01/10/22 1007  ?BP: 126/77  ?Pulse: (!) 56  ?Temp: 97.7 ?F (36.5 ?C)  ?TempSrc: Oral  ?SpO2: 100%  ?Weight: 171 lb 3.2 oz (77.7 kg)  ?Height: 5' 11"  (1.803 m)  ? ? ?Physical Exam: ?Constitutional: alert, well-appearing, in NAD ?HENT: normocephalic, atraumatic, mucous membranes moist ?Eyes: conjunctiva non-erythematous, EOMI ?Cardiovascular: RRR, no m/r/g, non-edematous bilateral LE ?Pulmonary/Chest: normal work of breathing on RA, LCTAB ?Abdominal: soft, non-tender to palpation, non-distended ?MSK: normal bulk and tone  ?Neurological: A&O x 3 and follows commands  ? ?Assessment & Plan:  ? ?See Encounters Tab for problem based charting. ? ?Patient discussed with Dr. Daryll [name] ? ?Greg Manes, MD  ?Internal Medicine Resident, PGY-1 ?[name]  ?

## 2022-01-10 NOTE — Patient Instructions (Signed)
Please continue to take your medications daily. There are no medication changes today.  ? ?I have called Dr Lewayne Bunting office. They will call you about an appointment, please keep your phone on you.  ? ?It was very nice to meet you! ? ?

## 2022-01-10 NOTE — Assessment & Plan Note (Signed)
Well controlled on Pravastatin 20 mg daily. Last lipid panel on 10/19 with LDL 77. Good medication compliance and tolerating medication well. Continue current regimen.  ?

## 2022-01-10 NOTE — Assessment & Plan Note (Addendum)
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  ?Had been following Dr Burr Medico for surveillance, however has been lost to follow up since 2020. Per last oncology note: "His risk of recurrence has significantly decreased. Will continue surveillance" which is overall reassuring. He denies any melena or hematochezia. He reports doing well and feeling well. Called Dr Ernestina Penna office at the [facility] to get this pleasant gentleman seen by her; her office will be in contact with him, and I have advised him to keep his phone handy.  ?

## 2022-01-10 NOTE — Assessment & Plan Note (Signed)
Stable. Continues to perform self in and out cath BID. He denies dysuria, hematuria, chills or fevers. He reports that he will be following up with Urology this month. Continue with Bactrim daily.  ?

## 2022-01-11 DIAGNOSIS — Z933 Colostomy status: Secondary | ICD-10-CM | POA: Diagnosis not present

## 2022-01-18 ENCOUNTER — Telehealth: Payer: Self-pay | Admitting: Hematology

## 2022-01-18 NOTE — Telephone Encounter (Signed)
Left message with follow-up appointment per 3/22 staff message. ?

## 2022-01-18 NOTE — Progress Notes (Signed)
Internal Medicine Clinic Attending  Case discussed with Dr. Patel at the time of the visit.  We reviewed the resident's history and exam and pertinent patient test results.  I agree with the assessment, diagnosis, and plan of care documented in the resident's note.  

## 2022-01-24 ENCOUNTER — Inpatient Hospital Stay: Payer: Medicare Other | Attending: Hematology | Admitting: Hematology

## 2022-01-24 DIAGNOSIS — C187 Malignant neoplasm of sigmoid colon: Secondary | ICD-10-CM

## 2022-02-12 DIAGNOSIS — Z933 Colostomy status: Secondary | ICD-10-CM | POA: Diagnosis not present

## 2022-03-23 DIAGNOSIS — R300 Dysuria: Secondary | ICD-10-CM | POA: Diagnosis not present

## 2022-03-23 DIAGNOSIS — N3020 Other chronic cystitis without hematuria: Secondary | ICD-10-CM | POA: Diagnosis not present

## 2022-07-03 ENCOUNTER — Ambulatory Visit (INDEPENDENT_AMBULATORY_CARE_PROVIDER_SITE_OTHER): Payer: Medicare Other

## 2022-07-03 DIAGNOSIS — Z0000 Encounter for general adult medical examination without abnormal findings: Secondary | ICD-10-CM | POA: Diagnosis not present

## 2022-07-03 NOTE — Patient Instructions (Signed)
Health Maintenance, Male Adopting a healthy lifestyle and getting preventive care are important in promoting health and wellness. Ask your health care provider about: The right schedule for you to have regular tests and exams. Things you can do on your own to prevent diseases and keep yourself healthy. What should I know about diet, weight, and exercise? Eat a healthy diet  Eat a diet that includes plenty of vegetables, fruits, low-fat dairy products, and lean protein. Do not eat a lot of foods that are high in solid fats, added sugars, or sodium. Maintain a healthy weight Body mass index (BMI) is a measurement that can be used to identify possible weight problems. It estimates body fat based on height and weight. Your health care provider can help determine your BMI and help you achieve or maintain a healthy weight. Get regular exercise Get regular exercise. This is one of the most important things you can do for your health. Most adults should: Exercise for at least 150 minutes each week. The exercise should increase your heart rate and make you sweat (moderate-intensity exercise). Do strengthening exercises at least twice a week. This is in addition to the moderate-intensity exercise. Spend less time sitting. Even light physical activity can be beneficial. Watch cholesterol and blood lipids Have your blood tested for lipids and cholesterol at 82 years of age, then have this test every 5 years. You may need to have your cholesterol levels checked more often if: Your lipid or cholesterol levels are high. You are older than 82 years of age. You are at high risk for heart disease. What should I know about cancer screening? Many types of cancers can be detected early and may often be prevented. Depending on your health history and family history, you may need to have cancer screening at various ages. This may include screening for: Colorectal cancer. Prostate cancer. Skin cancer. Lung  cancer. What should I know about heart disease, diabetes, and high blood pressure? Blood pressure and heart disease High blood pressure causes heart disease and increases the risk of stroke. This is more likely to develop in people who have high blood pressure readings or are overweight. Talk with your health care provider about your target blood pressure readings. Have your blood pressure checked: Every 3-5 years if you are 18-39 years of age. Every year if you are 40 years old or older. If you are between the ages of 65 and 75 and are a current or former smoker, ask your health care provider if you should have a one-time screening for abdominal aortic aneurysm (AAA). Diabetes Have regular diabetes screenings. This checks your fasting blood sugar level. Have the screening done: Once every three years after age 45 if you are at a normal weight and have a low risk for diabetes. More often and at a younger age if you are overweight or have a high risk for diabetes. What should I know about preventing infection? Hepatitis B If you have a higher risk for hepatitis B, you should be screened for this virus. Talk with your health care provider to find out if you are at risk for hepatitis B infection. Hepatitis C Blood testing is recommended for: Everyone born from 1945 through 1965. Anyone with known risk factors for hepatitis C. Sexually transmitted infections (STIs) You should be screened each year for STIs, including gonorrhea and chlamydia, if: You are sexually active and are younger than 82 years of age. You are older than 82 years of age and your   health care provider tells you that you are at risk for this type of infection. Your sexual activity has changed since you were last screened, and you are at increased risk for chlamydia or gonorrhea. Ask your health care provider if you are at risk. Ask your health care provider about whether you are at high risk for HIV. Your health care provider  may recommend a prescription medicine to help prevent HIV infection. If you choose to take medicine to prevent HIV, you should first get tested for HIV. You should then be tested every 3 months for as long as you are taking the medicine. Follow these instructions at home: Alcohol use Do not drink alcohol if your health care provider tells you not to drink. If you drink alcohol: Limit how much you have to 0-2 drinks a day. Know how much alcohol is in your drink. In the [place] one drink equals one 12 oz bottle of beer (355 mL), one 5 oz glass of wine (148 mL), or one 1 oz glass of hard liquor (44 mL). Lifestyle Do not use any products that contain nicotine or tobacco. These products include cigarettes, chewing tobacco, and vaping devices, such as e-cigarettes. If you need help quitting, ask your health care provider. Do not use street drugs. Do not share needles. Ask your health care provider for help if you need support or information about quitting drugs. General instructions Schedule regular health, dental, and eye exams. Stay current with your vaccines. Tell your health care provider if: You often feel depressed. You have ever been abused or do not feel safe at home. Summary Adopting a healthy lifestyle and getting preventive care are important in promoting health and wellness. Follow your health care provider's instructions about healthy diet, exercising, and getting tested or screened for diseases. Follow your health care provider's instructions on monitoring your cholesterol and blood pressure. This information is not intended to replace advice given to you by your health care provider. Make sure you discuss any questions you have with your health care provider. Document Revised: 02/27/2021 Document Reviewed: 02/27/2021 Elsevier Patient Education  2023 [employer]  

## 2022-07-03 NOTE — Progress Notes (Signed)
Subjective:   Greg [name] is a 81 y.o. male who presents for Medicare Annual/Subsequent preventive examination. I connected with  Tiney Rouge on 07/04/22 by a audio enabled telemedicine application and verified that I am speaking with the correct person using two identifiers.  Patient Location: Home  Provider Location: Office/Clinic  I discussed the limitations of evaluation and management by telemedicine. The patient expressed understanding and agreed to proceed.   Review of Systems    DEFERRED TO PCP  Cardiac Risk Factors include: advanced age (>4mn, >>22women);male gender;hypertension     Objective:    There were no vitals filed for this visit. There is no height or weight on file to calculate BMI.     07/03/2022    3:11 PM 01/10/2022   10:14 AM 09/12/2021    4:53 PM 08/09/2021    9:44 AM 06/15/2020   10:19 AM 01/02/2019    1:54 PM 12/31/2018   11:35 AM  Advanced Directives  Does Patient Have a Medical Advance Directive? No No No No No No No  Would patient like information on creating a medical advance directive? No - Patient declined No - Patient declined  Yes (MAU/Ambulatory/Procedural Areas - Information given) Yes (MAU/Ambulatory/Procedural Areas - Information given) Yes (MAU/Ambulatory/Procedural Areas - Information given) No - Patient declined    Current Medications (verified) Outpatient Encounter Medications as of 07/03/2022  Medication Sig   amLODipine (NORVASC) 5 MG tablet Take 1 tablet (5 mg total) by mouth daily.   AZO-CRANBERRY PO Take 500 mg by mouth daily. Two softgels daily   cetirizine (ZYRTEC) 10 MG tablet Take 1 tablet (10 mg total) by mouth at bedtime.   pravastatin (PRAVACHOL) 20 MG tablet Take 1 tablet (20 mg total) by mouth daily.   sulfamethoxazole-trimethoprim (BACTRIM DS,SEPTRA DS) 800-160 MG tablet Take 1 tablet by mouth daily.   No facility-administered encounter medications on file as of 07/03/2022.    Allergies  (verified) Ciprofloxacin hcl and Ibuprofen   History: Past Medical History:  Diagnosis Date   Atony of bladder    [facility]; CIC 3x/day, has been treated for culture proven serratia marcesens 05/2011, 06/2011, 07/2011, 09/2011   [name] (benign prostatic hyperplasia)    hyposensitive bladder, nodular prostate with increased PSA (10.23 in 03/2006), s/p prostate biopsy 2006 that was normal (Dr WJeffie Pollock he recommended TURP.   Cancer ([facility]    Chronic knee pain    left   Epididymitis    right   Frozen shoulder    left frozen shoulder   Gingivitis    Hyperlipidemia    Hypertension    Right shoulder pain    sx in 06/2005   Shingles    Sinus bradycardia    Past Surgical History:  Procedure Laterality Date   COLOSTOMY N/A 03/12/2015   Procedure: COLOSTOMY;  Surgeon: JJudeth Horn MD;  [facility]  Service: General;  Laterality: N/A;   COLOSTOMY REVISION N/A 03/12/2015   Procedure:  RESECTION SIGMOID COLON;  Surgeon: JJudeth Horn MD;  [facility]  Service: General;  Laterality: N/A;   hydrocelectomy  11/14/2010   left hydrocelectomy   KNEE SURGERY     L knee   LAPAROTOMY N/A 03/12/2015   Procedure: EXPLORATORY LAPAROTOMY;  Surgeon: JJudeth Horn MD;  [facility]  Service: General;  Laterality: N/A;   right shoulder arthroscopic surgery  06/2005   TRANSURETHRAL RESECTION OF PROSTATE     status post TURP 2/2 [name] in 2008   VASECTOMY  Family History  Problem Relation Age of Onset   Cancer Father        unknown type cancer    Social History   Socioeconomic History   Marital status: Single    Spouse name: Not on file   Number of children: Not on file   Years of education: 12   Highest education level: Not on file  Occupational History    Employer: RETIRED    Comment: retired  Tobacco Use   Smoking status: Former    Packs/day: 0.50    Years: 25.00    Total pack years: 12.50    Types: Cigarettes    Quit date: 02/07/1988    Years since quitting:  34.4   Smokeless tobacco: Never  Vaping Use   Vaping Use: Never used  Substance and Sexual Activity   Alcohol use: Yes    Alcohol/week: 0.0 standard drinks of alcohol    Comment: occasional    Drug use: No   Sexual activity: Not on file    Comment: condoms   Other Topics Concern   Not on file  Social History Narrative   Single, lives alone in apartment   No children   Several local siblings in area-will take him to grocery store, etc   Rides bus (75cents/trip) and bus is right outside his house   Prior employment [employer]   Reports strong faith helps him cope with life   Has had to self-cath twice daily since 2008 -Dr. Jeffie Pollock   [facility] for ostomy care and supplies   Ambulates w/walker-independent in ADL's-cooks pot pies and frozen dinners      Current Social History 01/02/2019        Patient lives alone in an apartment which is 1 story. There are not steps up to the entrance the patient uses.       Patient's method of transportation is city bus.      The highest level of education was some high school.      The patient is currently retired.      Identified important Relationships are  "God, I got it right.  I also go riding with my brother sometimes".        Interests / Fun: "I love going to football and basketball games, mainly high school and college.  I've also been to some pro games and met Alvy Beal and Shak".       Current Stressors: "I do not have any stressors in my life right now".       Religious / Personal Beliefs: I believe in God"    Social Determinants of Health   Financial Resource Strain: Low Risk  (07/03/2022)   Overall Financial Resource Strain (CARDIA)    Difficulty of Paying Living Expenses: Not hard at all  Food Insecurity: No Food Insecurity (07/03/2022)   Hunger Vital Sign    Worried About Running Out of Food in the Last Year: Never true    Ran Out of Food in the Last Year: Never true  Transportation Needs: Unmet Transportation Needs  (07/03/2022)   PRAPARE - Hydrologist (Medical): Yes    Lack of Transportation (Non-Medical): No  Physical Activity: Sufficiently Active (07/03/2022)   Exercise Vital Sign    Days of Exercise per Week: 4 days    Minutes of Exercise per Session: 50 min  Stress: No Stress Concern Present (07/03/2022)   [place]  Feeling of Stress : Not at all  Social Connections: Moderately Isolated (07/03/2022)   Social Connection and Isolation Panel [NHANES]    Frequency of Communication with Friends and Family: More than three times a week    Frequency of Social Gatherings with Friends and Family: Twice a week    Attends Religious Services: 1 to 4 times per year    Active Member of [employer] or Organizations: No    Attends Music therapist: Never    Marital Status: Never married    Tobacco Counseling Counseling given: Not Answered   Clinical Intake:  Pre-visit preparation completed: Yes  Pain : No/denies pain     Diabetes: No  How often do you need to have someone help you when you read instructions, pamphlets, or other written materials from your doctor or pharmacy?: 1 - Never What is the last grade level you completed in school?: 9 MONTH  Diabetic? NO      Information entered by :: [facility]   Activities of Daily Living    07/03/2022    3:13 PM 01/10/2022   10:13 AM  In your present state of health, do you have any difficulty performing the following activities:  Hearing? 0 0  Vision? 0 0  Difficulty concentrating or making decisions? 0 0  Walking or climbing stairs? 1 1  Comment  Sometimes.  Dressing or bathing? 0 0  Doing errands, shopping? 0 0  Preparing Food and eating ? N   Using the Toilet? N   In the past six months, have you accidently leaked urine? N   Do you have problems with loss of bowel control? N   Managing your Medications? N   Managing your  Finances? N   Housekeeping or managing your Housekeeping? N     Patient Care Team: Velna Ochs, MD as PCP - General (Internal Medicine) Judeth Horn, MD as Consulting Physician (General Surgery) Truitt Merle, MD as Consulting Physician (Hematology) Irine Seal, MD as Attending Physician (Urology) Tania Ade, RN as Registered Nurse (Medical Oncology) [facility] as Consulting Physician  Indicate any recent Medical Services you may have received from other than [facility] providers in the past year (date may be approximate).     Assessment:   This is a routine wellness examination for [facility].  Hearing/Vision screen No results found.  Dietary issues and exercise activities discussed: Current Exercise Habits: Home exercise routine, Type of exercise: walking, Time (Minutes): 45, Frequency (Times/Week): 3, Weekly Exercise (Minutes/Week): 135, Intensity: Mild, Exercise limited by: None identified   Goals Addressed   None   Depression Screen    07/03/2022    3:10 PM 01/10/2022   10:13 AM 08/09/2021    9:59 AM 06/15/2020   10:34 AM 01/02/2019    4:16 PM 09/29/2018    2:19 PM 09/09/2017    2:04 PM  PHQ 2/9 Scores  PHQ - 2 Score 0 0 0 1 0 0 0  PHQ- 9 Score   0 1 0 1     Fall Risk    07/03/2022    3:11 PM 01/10/2022   10:13 AM 08/09/2021    9:44 AM 06/15/2020   10:19 AM 01/02/2019    4:17 PM  Fall Risk   Falls in the past year? 0 0 0 0   Number falls in past yr: 0 0     Injury with Fall? 0 0     Risk for fall due to : Other (Comment) No  Fall Risks Other (Comment)    Risk for fall due to: Comment   says leg gets weak at times    Follow up Falls evaluation completed;Falls prevention discussed Falls evaluation completed;Follow up appointment Falls evaluation completed;Education provided Falls evaluation completed Education provided;Falls prevention discussed    FALL RISK PREVENTION PERTAINING TO THE HOME:  Any stairs in or around the home? No  If so, are  there any without handrails? No  Home free of loose throw rugs in walkways, pet beds, electrical cords, etc? No  Adequate lighting in your home to reduce risk of falls? Yes   ASSISTIVE DEVICES UTILIZED TO PREVENT FALLS:  Life alert? No  Use of a cane, walker or w/c? Yes  Grab bars in the bathroom? Yes  Shower chair or bench in shower? No  Elevated toilet seat or a handicapped toilet? No   TIMED UP AND GO:  Was the test performed? No .  Length of time to ambulate 10 feet: N/A sec.     Cognitive Function:        07/03/2022    3:15 PM  6CIT Screen  What Year? 0 points  What month? 0 points  What time? 0 points  Count back from 20 0 points  Months in reverse 0 points  Repeat phrase 0 points  Total Score 0 points    Immunizations Immunization History  Administered Date(s) Administered   Influenza Split 08/20/2011, 09/08/2012   Influenza Whole 08/23/2006, 09/26/2009, 08/09/2010   Influenza,inj,Quad PF,6+ Mos 09/03/2013, 07/22/2014, 07/07/2015, 09/09/2017, 09/29/2018   Influenza-Unspecified 08/09/2021   Pneumococcal Conjugate-13 07/07/2015   Pneumococcal Polysaccharide-23 09/26/2009   Td 02/25/2012   Tdap 09/12/2021    TDAP status: UP TO DATE   Flu Vaccine status: Due, Education has been provided regarding the importance of this vaccine. Advised may receive this vaccine at local pharmacy or Health Dept. Aware to provide a copy of the vaccination record if obtained from local pharmacy or Health Dept. Verbalized acceptance and understanding.  Pneumococcal vaccine status: Up to date  Covid-19 vaccine status: Information provided on how to obtain vaccines.   Qualifies for Shingles Vaccine? YES   Zostavax completed No   Shingrix Completed?: No.    Education has been provided regarding the importance of this vaccine. Patient has been advised to call insurance company to determine out of pocket expense if they have not yet received this vaccine. Advised may also receive  vaccine at local pharmacy or Health Dept. Verbalized acceptance and understanding.  Screening Tests Health Maintenance  Topic Date Due   COVID-19 Vaccine (1) Never done   Zoster Vaccines- Shingrix (1 of 2) Never done   INFLUENZA VACCINE  05/22/2022   TETANUS/TDAP  09/13/2031   Pneumonia Vaccine 52+ Years old  Completed   HPV VACCINES  Aged Out    Health Maintenance  Health Maintenance Due  Topic Date Due   COVID-19 Vaccine (1) Never done   Zoster Vaccines- Shingrix (1 of 2) Never done   INFLUENZA VACCINE  05/22/2022    Colorectal cancer screening: No longer required.   Lung Cancer Screening: (Low Dose CT Chest recommended if Age 76-80 years, 30 pack-year currently smoking OR have quit w/in 15years.) does not qualify.   Lung Cancer Screening Referral: DEFERRED TO PCP   Additional Screening:  Hepatitis C Screening: does qualify;  DEFERRED TO PCP   Vision Screening: Recommended annual ophthalmology exams for early detection of glaucoma and other disorders of the eye. Is the patient up to date  with their annual eye exam?  No  Who is the provider or what is the name of the office in which the patient attends annual eye exams? [facility]  If pt is not established with a provider, would they like to be referred to a provider to establish care? No .   Dental Screening: Recommended annual dental exams for proper oral hygiene  Community Resource Referral / Chronic Care Management: CRR required this visit?  No   CCM required this visit?  No      Plan:     I have personally reviewed and noted the following in the patient's chart:   Medical and social history Use of alcohol, tobacco or illicit drugs  Current medications and supplements including opioid prescriptions. Patient is not currently taking opioid prescriptions. Functional ability and status Nutritional status Physical activity Advanced directives List of other physicians Hospitalizations, surgeries, and ER  visits in previous 12 months Vitals Screenings to include cognitive, depression, and falls Referrals and appointments  In addition, I have reviewed and discussed with patient certain preventive protocols, quality metrics, and best practice recommendations. A written personalized care plan for preventive services as well as general preventive health recommendations were provided to patient.     Judyann Munson, CMA   07/04/2022   Nurse Notes: NON FACE TO FACE   Mr. Smock , Thank you for taking time to come for your Medicare Wellness Visit. I appreciate your ongoing commitment to your health goals. Please review the following plan we discussed and let me know if I can assist you in the future.   These are the goals we discussed:  Goals      Blood Pressure < 140/90     LDL CALC < 130        This is a list of the screening recommended for you and due dates:  Health Maintenance  Topic Date Due   COVID-19 Vaccine (1) Never done   Zoster (Shingles) Vaccine (1 of 2) Never done   Flu Shot  05/22/2022   Tetanus Vaccine  09/13/2031   Pneumonia Vaccine  Completed   HPV Vaccine  Aged Out

## 2022-07-18 NOTE — Progress Notes (Signed)
Internal Medicine Clinic Attending  Case and documentation of Dr. Masters  reviewed.  I reviewed the AWV findings.  I agree with the assessment, diagnosis, and plan of care documented in the AWV note.     

## 2022-10-02 DIAGNOSIS — Z933 Colostomy status: Secondary | ICD-10-CM | POA: Diagnosis not present

## 2022-12-19 ENCOUNTER — Other Ambulatory Visit: Payer: Self-pay | Admitting: Internal Medicine

## 2022-12-19 DIAGNOSIS — I10 Essential (primary) hypertension: Secondary | ICD-10-CM

## 2022-12-19 NOTE — Telephone Encounter (Signed)
Approved 30 day refill of amlodipine until he can be seen.

## 2023-01-11 ENCOUNTER — Other Ambulatory Visit: Payer: Self-pay | Admitting: Internal Medicine

## 2023-01-11 DIAGNOSIS — I10 Essential (primary) hypertension: Secondary | ICD-10-CM

## 2023-01-25 ENCOUNTER — Encounter: Payer: 59 | Admitting: Student

## 2023-01-31 ENCOUNTER — Ambulatory Visit (INDEPENDENT_AMBULATORY_CARE_PROVIDER_SITE_OTHER): Payer: 59

## 2023-01-31 ENCOUNTER — Other Ambulatory Visit: Payer: Self-pay

## 2023-01-31 VITALS — BP 129/78 | HR 54 | Temp 98.0°F | Ht 71.0 in | Wt 173.6 lb

## 2023-01-31 DIAGNOSIS — E785 Hyperlipidemia, unspecified: Secondary | ICD-10-CM

## 2023-01-31 DIAGNOSIS — C187 Malignant neoplasm of sigmoid colon: Secondary | ICD-10-CM

## 2023-01-31 DIAGNOSIS — I10 Essential (primary) hypertension: Secondary | ICD-10-CM

## 2023-01-31 DIAGNOSIS — N312 Flaccid neuropathic bladder, not elsewhere classified: Secondary | ICD-10-CM | POA: Diagnosis not present

## 2023-01-31 DIAGNOSIS — Z0000 Encounter for general adult medical examination without abnormal findings: Secondary | ICD-10-CM

## 2023-01-31 MED ORDER — PRAVASTATIN SODIUM 20 MG PO TABS: 20.0000 mg | ORAL_TABLET | Freq: Every day | ORAL | 2 refills | Status: DC

## 2023-01-31 NOTE — Patient Instructions (Signed)
Mr.Devynn Shehan, it was a pleasure seeing you today! You endorsed feeling well today. Below are some of the things we talked about this visit. We look forward to seeing you in the follow up appointment!  Today we discussed: Blood pressure: no changes Cholesterol: keep taking the pravastatin Colon cancer: I don't think you need to go back and see dr. Mosetta Putt unless you start to notice blood in stool then I would make an appointment with her. Bladder: make an appointment with Dr. Annabell Howells and keep taking the bactrim.   Make sure to get your second shingles shot.  I have ordered the following labs today:   Lab Orders         Lipid Profile         CMP14 + Anion Gap       Referrals ordered today:   Referral Orders  No referral(s) requested today     I have ordered the following medication/changed the following medications:   Stop the following medications: Medications Discontinued During This Encounter  Medication Reason   pravastatin (PRAVACHOL) 20 MG tablet Reorder     Start the following medications: Meds ordered this encounter  Medications   pravastatin (PRAVACHOL) 20 MG tablet    Sig: Take 1 tablet (20 mg total) by mouth daily.    Dispense:  90 tablet    Refill:  2     Follow-up: 6 months - 1 year routine follow up   Please make sure to arrive 15 minutes prior to your next appointment. If you arrive late, you may be asked to reschedule.   We look forward to seeing you next time. Please call our clinic at [phone] if you have any questions or concerns. The best time to call is Monday-Friday from 9am-4pm, but there is someone available 24/7. If after hours or the weekend, call the main hospital number and ask for the Internal Medicine Resident On-Call. If you need medication refills, please notify your pharmacy one week in advance and they will send [place] a request.  Thank you for letting us take part in your care. Wishing you the best!  Thank you, Lyndle Herrlich  MD

## 2023-01-31 NOTE — Assessment & Plan Note (Addendum)
Wasn't able to follow up with Dr. Mosetta Putt, has ostomy in place. No melena or hematochezia. He is not interested in ostomy reversal at this time. It seems like the last time he was seen, they mentioned he has low risk for recurrence. Can follow up with them PRN if he has symptomatic changes.

## 2023-01-31 NOTE — Assessment & Plan Note (Signed)
BP looks good in clinic today on amlodipine 5, continue

## 2023-01-31 NOTE — Assessment & Plan Note (Signed)
Refilled pravastatin, lipid profile today.

## 2023-01-31 NOTE — Progress Notes (Signed)
Internal Medicine Clinic Attending  Case discussed with Dr. Sridharan  At the time of the visit.  We reviewed the resident's history and exam and pertinent patient test results.  I agree with the assessment, diagnosis, and plan of care documented in the resident's note.  

## 2023-01-31 NOTE — Progress Notes (Signed)
   CC: routine f\u  HPI:  Mr.Briyan Wildey is a 82 y.o.-year-old male with past medical history as below presenting for routine f\u.  Please see encounters tab for problem-based charting.  Past Medical History:  Diagnosis Date   Atony of bladder    followed at [facility]; CIC 3x/day, has been treated for culture proven serratia marcesens 05/2011, 06/2011, 07/2011, 09/2011   [name] (benign prostatic hyperplasia)    hyposensitive bladder, nodular prostate with increased PSA (10.23 in 03/2006), s/p prostate biopsy 2006 that was normal ([name]) he recommended TURP.   Cancer    Chronic knee pain    left   Epididymitis    right   Frozen shoulder    left frozen shoulder   Gingivitis    Hyperlipidemia    Hypertension    Right shoulder pain    sx in 06/2005   Shingles    Sinus bradycardia    Review of Systems: As in HPI.  Please see encounters tab for problem based charting.  Physical Exam:  Vitals:   01/31/23 1002  BP: 129/78  Pulse: (!) 54  Temp: 98 F (36.7 C)  TempSrc: Oral  SpO2: 99%  Weight: 173 lb 9.6 oz (78.7 kg)  Height: 5\' 11"  (1.803 m)   General:Well-appearing, pleasant, In NAD Cardiac: RRR, no murmurs rubs or gallops. Respiratory: Normal work of breathing on room air, CTAB Abdominal: Soft, nontender, nondistended, ostomy back in place without surrounding TTP draining brown stool   Assessment & Plan:   Atony of bladder In and out cath three times a day. Still taking bactrim daily. No hematuria, dysuria, no changes in urinary frequency. Will check kidney function today.  HLD (hyperlipidemia) Refilled pravastatin, lipid profile today.  Essential hypertension BP looks good in clinic today on amlodipine 5, continue  Cancer of sigmoid colon (HCC) Wasn't able to follow up with Dr. Mosetta Putt, has ostomy in place. No melena or hematochezia. He is not interested in ostomy reversal at this time. It seems like the last time he was seen, they mentioned he has low  risk for recurrence. Can follow up with them PRN if he has symptomatic changes.  Healthcare maintenance Got first shingrix dose a couple years ago, had phone issues so hasn't gotten second one but he mentioned he is planning to soon.   Patient discussed with Dr.  Lafonda Mosses

## 2023-01-31 NOTE — Assessment & Plan Note (Addendum)
In and out cath three times a day. Still taking bactrim daily. No hematuria, dysuria, no changes in urinary frequency. Will check kidney function today.

## 2023-01-31 NOTE — Assessment & Plan Note (Signed)
Got first shingrix dose a couple years ago, had phone issues so hasn't gotten second one but he mentioned he is planning to soon.

## 2023-02-01 LAB — CMP14 + ANION GAP
ALT: 8 IU/L (ref 0–44)
AST: 12 IU/L (ref 0–40)
Albumin/Globulin Ratio: 1.4 (ref 1.2–2.2)
Albumin: 4.2 g/dL (ref 3.7–4.7)
Alkaline Phosphatase: 93 IU/L (ref 44–121)
Anion Gap: 13 mmol/L (ref 10.0–18.0)
BUN/Creatinine Ratio: 11 (ref 10–24)
BUN: 14 mg/dL (ref 8–27)
Bilirubin Total: 0.4 mg/dL (ref 0.0–1.2)
CO2: 24 mmol/L (ref 20–29)
Calcium: 8.6 mg/dL (ref 8.6–10.2)
Chloride: 102 mmol/L (ref 96–106)
Creatinine, Ser: 1.24 mg/dL (ref 0.76–1.27)
Globulin, Total: 2.9 g/dL (ref 1.5–4.5)
Glucose: 86 mg/dL (ref 70–99)
Potassium: 4.2 mmol/L (ref 3.5–5.2)
Sodium: 139 mmol/L (ref 134–144)
Total Protein: 7.1 g/dL (ref 6.0–8.5)
eGFR: 58 mL/min/{1.73_m2} — ABNORMAL LOW (ref >59)

## 2023-02-01 LAB — LIPID PANEL
Chol/HDL Ratio: 2.3 ratio (ref 0.0–5.0)
Cholesterol, Total: 191 mg/dL (ref 100–199)
HDL: 84 mg/dL (ref >39)
LDL Chol Calc (NIH): 98 mg/dL (ref 0–99)
Triglycerides: 47 mg/dL (ref 0–149)
VLDL Cholesterol Cal: 9 mg/dL (ref 5–40)

## 2023-05-01 DIAGNOSIS — R300 Dysuria: Secondary | ICD-10-CM | POA: Diagnosis not present

## 2023-05-01 DIAGNOSIS — N3020 Other chronic cystitis without hematuria: Secondary | ICD-10-CM | POA: Diagnosis not present

## 2023-05-22 DIAGNOSIS — R339 Retention of urine, unspecified: Secondary | ICD-10-CM | POA: Diagnosis not present

## 2023-07-07 ENCOUNTER — Other Ambulatory Visit: Payer: Self-pay | Admitting: Internal Medicine

## 2023-07-07 DIAGNOSIS — I10 Essential (primary) hypertension: Secondary | ICD-10-CM

## 2023-08-15 DIAGNOSIS — R339 Retention of urine, unspecified: Secondary | ICD-10-CM | POA: Diagnosis not present

## 2023-11-06 ENCOUNTER — Encounter: Payer: 59 | Admitting: Internal Medicine

## 2023-11-20 ENCOUNTER — Ambulatory Visit: Payer: 59 | Admitting: Internal Medicine

## 2023-11-20 VITALS — BP 128/74 | HR 57 | Temp 97.5°F | Ht 71.0 in | Wt 171.2 lb

## 2023-11-20 DIAGNOSIS — Z85038 Personal history of other malignant neoplasm of large intestine: Secondary | ICD-10-CM

## 2023-11-20 DIAGNOSIS — I10 Essential (primary) hypertension: Secondary | ICD-10-CM | POA: Diagnosis not present

## 2023-11-20 DIAGNOSIS — E785 Hyperlipidemia, unspecified: Secondary | ICD-10-CM

## 2023-11-20 DIAGNOSIS — N312 Flaccid neuropathic bladder, not elsewhere classified: Secondary | ICD-10-CM | POA: Diagnosis not present

## 2023-11-20 MED ORDER — PRAVASTATIN SODIUM 20 MG PO TABS: 20.0000 mg | ORAL_TABLET | Freq: Every day | ORAL | 2 refills | Status: AC

## 2023-11-20 MED ORDER — AMLODIPINE BESYLATE 5 MG PO TABS: 5.0000 mg | ORAL_TABLET | Freq: Every day | ORAL | 3 refills | Status: AC

## 2023-11-20 NOTE — Patient Instructions (Addendum)
Thank you, [name] for allowing [place] to provide your care today.  I am checking labs for your kidneys and your cholesterol.  I will call you with those results.  I have sent in refills for amlodipine and pravastatin.  We will see if we can get records from your pharmacy to see if you had the flu vaccine.  Please call Dr. Latanya [name] office to get follow-up for cancer surveillance.  Listed her contact information below I think that you should check with your brother to see if he could help you with getting a ride there. [facility]   [phone] [phone]   Clinic Follow up Note    I have ordered the following labs for you:  Lab Orders         Lipid Profile         BMP8+Anion Gap      I have ordered the following medication/changed the following medications:   Stop the following medications: Medications Discontinued During This Encounter  Medication Reason   sulfamethoxazole-trimethoprim (BACTRIM DS,SEPTRA DS) 800-160 MG tablet    pravastatin (PRAVACHOL) 20 MG tablet Reorder   amLODipine (NORVASC) 5 MG tablet Reorder     Start the following medications: Meds ordered this encounter  Medications   amLODipine (NORVASC) 5 MG tablet    Sig: Take 1 tablet (5 mg total) by mouth daily.    Dispense:  90 tablet    Refill:  3   pravastatin (PRAVACHOL) 20 MG tablet    Sig: Take 1 tablet (20 mg total) by mouth daily.    Dispense:  90 tablet    Refill:  2     Follow up:12 months  We look forward to seeing you next time. Please call our clinic at [phone] if you have any questions or concerns. The best time to call is Monday-Friday from 9am-4pm, but there is someone available 24/7. If after hours or the weekend, call the main hospital number and ask for the Internal Medicine Resident On-Call. If you need medication refills, please notify your pharmacy one week in advance and they will send [place] a request.   Thank you for trusting me with your  care. Wishing you the best!   Rudene Christians, DO [facility]

## 2023-11-20 NOTE — Progress Notes (Addendum)
Subjective:  CC: follow-up  HPI:  Greg [name] is a 83 y.o. male with a past medical history of allergic rhinitis, hypertension, atony of bladder, self-catheterization, [name], history cancer of sigmoid colon who presents today for follow-up on chronic conditions.    He has not been seen in clinic since April 2024.  Please see problem based assessment and plan for additional details.  Past Medical History:  Diagnosis Date   Atony of bladder    followed at [facility]; CIC 3x/day, has been treated for culture proven serratia marcesens 05/2011, 06/2011, 07/2011, 09/2011   [name] (benign prostatic hyperplasia)    hyposensitive bladder, nodular prostate with increased PSA (10.23 in 03/2006), s/p prostate biopsy 2006 that was normal ([name]) he recommended TURP.   Cancer [facility])    Chronic knee pain    left   Epididymitis    right   Frozen shoulder    left frozen shoulder   Gingivitis    Hyperlipidemia    Hypertension    Right shoulder pain    sx in 06/2005   Shingles    Sinus bradycardia     MEDICATIONS:  Amlodipine 5 mg Pravastatin 20 mg Cetirizine 10 mg Family History  Problem Relation Age of Onset   Cancer Father        unknown type cancer     Social History   Socioeconomic History   Marital status: Single    Spouse name: Not on file   Number of children: Not on file   Years of education: 12   Highest education level: Not on file  Occupational History    Employer: RETIRED    Comment: retired  Tobacco Use   Smoking status: Former    Current packs/day: 0.00    Average packs/day: 0.5 packs/day for 25.0 years (12.5 ttl pk-yrs)    Types: Cigarettes    Start date: 02/07/1963    Quit date: 02/07/1988    Years since quitting: 35.8   Smokeless tobacco: Never  Vaping Use   Vaping status: Never Used  Substance and Sexual Activity   Alcohol use: Yes    Alcohol/week: 0.0 standard drinks of alcohol    Comment: occasional    Drug use: No   Sexual activity:  Not on file    Comment: condoms   Other Topics Concern   Not on file  Social History Narrative   Single, lives alone in apartment   No children   Several local siblings in area-will take him to grocery store, etc   Rides bus (75cents/trip) and bus is right outside his house   Prior employment [employer]   Reports strong faith helps him cope with life   Has had to self-cath twice daily since 2008 -Dr. Annabell Howells   [facility] for ostomy care and supplies   Ambulates w/walker-independent in ADL's-cooks pot pies and frozen dinners      Current Social History 01/02/2019        Patient lives alone in an apartment which is 1 story. There are not steps up to the entrance the patient uses.       Patient's method of transportation is city bus.      The highest level of education was some high school.      The patient is currently retired.      Identified important Relationships are  "God, I got it right.  I also go riding with my brother sometimes".  Interests / Fun: "I love going to football and basketball games, mainly high school and college.  I've also been to some pro games and met Rocco Serene and Shak".       Current Stressors: "I do not have any stressors in my life right now".       Religious / Personal Beliefs: I believe in God"    Social Drivers of Health   Financial Resource Strain: Low Risk  (07/03/2022)   Overall Financial Resource Strain (CARDIA)    Difficulty of Paying Living Expenses: Not hard at all  Food Insecurity: No Food Insecurity (07/03/2022)   Hunger Vital Sign    Worried About Running Out of Food in the Last Year: Never true    Ran Out of Food in the Last Year: Never true  Transportation Needs: Unmet Transportation Needs (01/31/2023)   PRAPARE - Administrator, Civil Service (Medical): Yes    Lack of Transportation (Non-Medical): No  Physical Activity: Sufficiently Active (07/03/2022)   Exercise Vital Sign    Days of Exercise per Week: 4  days    Minutes of Exercise per Session: 50 min  Stress: No Stress Concern Present (07/03/2022)   [employer] of Occupational Health - Occupational Stress Questionnaire    Feeling of Stress : Not at all  Social Connections: Moderately Isolated (07/03/2022)   Social Connection and Isolation Panel [NHANES]    Frequency of Communication with Friends and Family: More than three times a week    Frequency of Social Gatherings with Friends and Family: Twice a week    Attends Religious Services: 1 to 4 times per year    Active Member of [employer] or Organizations: No    Attends Banker Meetings: Never    Marital Status: Never married  Intimate Partner Violence: Not At Risk (07/03/2022)   Humiliation, Afraid, Rape, and Kick questionnaire    Fear of Current or Ex-Partner: No    Emotionally Abused: No    Physically Abused: No    Sexually Abused: No    Review of Systems: ROS negative except for what is noted on the assessment and plan.  Objective:   Vitals:   11/20/23 1336  BP: 128/74  Pulse: (!) 57  Temp: (!) 97.5 F (36.4 C)  TempSrc: Oral  SpO2: 100%  Weight: 171 lb 3.2 oz (77.7 kg)  Height: 5\' 11"  (1.803 m)    Physical Exam: Constitutional: well-appearing, in no acute distress, relates with assistance of walker Cardiovascular: regular rate and rhythm, no m/r/g Pulmonary/Chest: normal work of breathing on room air, lungs clear to auscultation bilaterally MSK: normal bulk and tone Neurological: alert & oriented x 3 Skin: warm and dry Psych: Pleasant   Assessment & Plan:  History of colon cancer Last visit with oncology was in 2020 with Dr. Mosetta Putt.  Her note indicates that his risk of recurrence has significantly decreased.  I do think he should follow-up with her if this is within his goals of care.  I reviewed this with him and his main trouble is getting a ride there.  He does have a brother in town that could drive him. -Follow-up with oncology for  surveillance  Essential hypertension Blood pressure well-controlled with amlodipine 5 mg. Repeat BMP  Atony of bladder Continues to self cath 3 times a day.  He follows closely with Dr. Alycia Rossetti with urology.  He does not have any difficulty with getting his catheterization supplies for ostomy supplies.  HLD (hyperlipidemia) Repeat lipid panel.  He reports adherence with pravastatin 20 mg. LDL at goal for primary prevention  Addendum: LDL at goal for primary prevention in 80s.   Patient discussed with Dr. Hurshel Keys Mikalyn Hermida, D.O. [facility]  [id number]-[id number] Pager: [phone]  Phone: [phone] Date 11/22/2023  Time 2:04 PM

## 2023-11-21 LAB — BMP8+ANION GAP
Anion Gap: 15 mmol/L (ref 10.0–18.0)
BUN/Creatinine Ratio: 17 (ref 10–24)
BUN: 15 mg/dL (ref 8–27)
CO2: 22 mmol/L (ref 20–29)
Calcium: 8.9 mg/dL (ref 8.6–10.2)
Chloride: 102 mmol/L (ref 96–106)
Creatinine, Ser: 0.86 mg/dL (ref 0.76–1.27)
Glucose: 87 mg/dL (ref 70–99)
Potassium: 4.2 mmol/L (ref 3.5–5.2)
Sodium: 139 mmol/L (ref 134–144)
eGFR: 86 mL/min/{1.73_m2} (ref >59)

## 2023-11-21 LAB — LIPID PANEL
Chol/HDL Ratio: 2.2 {ratio} (ref 0.0–5.0)
Cholesterol, Total: 165 mg/dL (ref 100–199)
HDL: 76 mg/dL (ref >39)
LDL Chol Calc (NIH): 81 mg/dL (ref 0–99)
Triglycerides: 36 mg/dL (ref 0–149)
VLDL Cholesterol Cal: 8 mg/dL (ref 5–40)

## 2023-11-21 NOTE — Assessment & Plan Note (Signed)
Last visit with oncology was in 2020 with Dr. Mosetta Putt.  Her note indicates that his risk of recurrence has significantly decreased.  I do think he should follow-up with her if this is within his goals of care.  I reviewed this with him and his main trouble is getting a ride there.  He does have a brother in town that could drive him. -Follow-up with oncology for surveillance

## 2023-11-21 NOTE — Assessment & Plan Note (Signed)
Continues to self cath 3 times a day.  He follows closely with Dr. Alycia Rossetti with urology.  He does not have any difficulty with getting his catheterization supplies for ostomy supplies.

## 2023-11-21 NOTE — Assessment & Plan Note (Addendum)
Repeat lipid panel.  He reports adherence with pravastatin 20 mg. LDL at goal for primary prevention  Addendum: LDL at goal for primary prevention in 80s.

## 2023-11-21 NOTE — Assessment & Plan Note (Signed)
Blood pressure well-controlled with amlodipine 5 mg. Repeat BMP

## 2023-11-22 ENCOUNTER — Encounter: Payer: Self-pay | Admitting: Internal Medicine

## 2023-11-25 NOTE — Progress Notes (Signed)
 Internal Medicine Clinic Attending  Case discussed with the resident at the time of the visit.  We reviewed the resident's history and exam and pertinent patient test results.  I agree with the assessment, diagnosis, and plan of care documented in the resident's note.

## 2024-02-18 DIAGNOSIS — R339 Retention of urine, unspecified: Secondary | ICD-10-CM | POA: Diagnosis not present

## 2024-04-22 ENCOUNTER — Ambulatory Visit

## 2024-04-22 VITALS — Ht 71.0 in | Wt 182.0 lb

## 2024-04-22 DIAGNOSIS — Z0000 Encounter for general adult medical examination without abnormal findings: Secondary | ICD-10-CM

## 2024-04-22 NOTE — Patient Instructions (Signed)
 Greg [name] , Thank you for taking time out of your busy schedule to complete your Annual Wellness Visit with me. I enjoyed our conversation and look forward to speaking with you again next year. I, as well as your care team,  appreciate your ongoing commitment to your health goals. Please review the following plan we discussed and let me know if I can assist you in the future. Your Game plan/ To Do List    Referrals: If you haven't heard from the office you've been referred to, please reach out to them at the phone provided.   Follow up Visits: Next Medicare AWV with our clinical staff: 04/28/2025 at 3:30 pm phone visit with Nurse   Have you seen your provider in the last 6 months (3 months if uncontrolled diabetes)? No Next Office Visit with your provider: No  Clinician Recommendations:  Aim for 30 minutes of exercise or brisk walking, 6-8 glasses of water, and 5 servings of fruits and vegetables each day.       This is a list of the screening recommended for you and due dates:  Health Maintenance  Topic Date Due   COVID-19 Vaccine (1) Never done   Zoster (Shingles) Vaccine (2 of 2) 03/02/2024   Flu Shot  05/22/2024   Medicare Annual Wellness Visit  04/22/2025   DTaP/Tdap/Td vaccine (3 - Td or Tdap) 09/13/2031   Pneumococcal Vaccine for age over 81  Completed   Hepatitis B Vaccine  Aged Out   HPV Vaccine  Aged Out   Meningitis B Vaccine  Aged Out    Advanced directives:  Advance Care Planning is important because it:  [x]  Makes sure you receive the medical care that is consistent with your values, goals, and preferences  [x]  It provides guidance to your family and loved ones and reduces their decisional burden about whether or not they are making the right decisions based on your wishes.  Follow the link provided in your after visit summary or read over the paperwork we have mailed to you to help you started getting your Advance Directives in place. If you need assistance in  completing these, please reach out to us  so that we can help you!  See attachments for Preventive Care and Fall Prevention Tips.

## 2024-04-22 NOTE — Progress Notes (Signed)
 Because this visit was a virtual/telehealth visit,  certain criteria was not obtained, such a blood pressure, CBG if applicable, and timed get up and go. Any medications not marked as taking were not mentioned during the medication reconciliation part of the visit. Any vitals not documented were not able to be obtained due to this being a telehealth visit or patient was unable to self-report a recent blood pressure reading due to a lack of equipment at home via telehealth. Vitals that have been documented are verbally provided by the patient.   Subjective:   Greg [name] is a 83 y.o. who presents for a Medicare Wellness preventive visit.  As a reminder, Annual Wellness Visits don't include a physical exam, and some assessments may be limited, especially if this visit is performed virtually. We may recommend an in-person follow-up visit with your provider if needed.  Visit Complete: Virtual I connected with  [employer] on 04/22/24 by a audio enabled telemedicine application and verified that I am speaking with the correct person using two identifiers.  Patient Location: Home  Provider Location: Office/Clinic  I discussed the limitations of evaluation and management by telemedicine. The patient expressed understanding and agreed to proceed.  Vital Signs: Because this visit was a virtual/telehealth visit, some criteria may be missing or patient reported. Any vitals not documented were not able to be obtained and vitals that have been documented are patient reported.  VideoDeclined- This patient declined Librarian, academic. Therefore the visit was completed with audio only.  Persons Participating in Visit: Patient.  AWV Questionnaire: No: Patient Medicare AWV questionnaire was not completed prior to this visit.  Cardiac Risk Factors include: advanced age (>61men, >18 women);dyslipidemia;hypertension;male gender     Objective:    Today's Vitals    04/22/24 1543  Weight: 182 lb (82.6 kg)  Height: 5' 11 (1.803 m)  PainSc: 0-No pain   Body mass index is 25.38 kg/m.     04/22/2024    3:45 PM 01/31/2023   10:06 AM 07/03/2022    3:11 PM 01/10/2022   10:14 AM 09/12/2021    4:53 PM 08/09/2021    9:44 AM 06/15/2020   10:19 AM  Advanced Directives  Does Patient Have a Medical Advance Directive? No No No No No No No  Would patient like information on creating a medical advance directive? No - Patient declined No - Patient declined No - Patient declined No - Patient declined  Yes (MAU/Ambulatory/Procedural Areas - Information given) Yes (MAU/Ambulatory/Procedural Areas - Information given)    Current Medications (verified) Outpatient Encounter Medications as of 04/22/2024  Medication Sig   amLODipine  (NORVASC ) 5 MG tablet Take 1 tablet (5 mg total) by mouth daily.   AZO-CRANBERRY PO Take 500 mg by mouth daily. Two softgels daily   cetirizine  (ZYRTEC ) 10 MG tablet Take 1 tablet (10 mg total) by mouth at bedtime.   pravastatin  (PRAVACHOL ) 20 MG tablet Take 1 tablet (20 mg total) by mouth daily.   No facility-administered encounter medications on file as of 04/22/2024.    Allergies (verified) Ciprofloxacin  hcl and Ibuprofen   History: Past Medical History:  Diagnosis Date   Atony of bladder    followed at [facility]; CIC 3x/day, has been treated for culture proven serratia marcesens 05/2011, 06/2011, 07/2011, 09/2011   [name] (benign prostatic hyperplasia)    hyposensitive bladder, nodular prostate with increased PSA (10.23 in 03/2006), s/p prostate biopsy 2006 that was normal (Dr Watt) he recommended TURP.   Cancer (  HCC)    Chronic knee pain    left   Epididymitis    right   Frozen shoulder    left frozen shoulder   Gingivitis    Hyperlipidemia    Hypertension    Right shoulder pain    sx in 06/2005   Shingles    Sinus bradycardia    Past Surgical History:  Procedure Laterality Date   COLOSTOMY N/A 03/12/2015    Procedure: COLOSTOMY;  Surgeon: Lynwood Pina, MD;  [facility] OR;  Service: General;  Laterality: N/A;   COLOSTOMY REVISION N/A 03/12/2015   Procedure:  RESECTION SIGMOID COLON;  Surgeon: Lynwood Pina, MD;  Location: [facility] OR;  Service: General;  Laterality: N/A;   hydrocelectomy  11/14/2010   left hydrocelectomy   KNEE SURGERY     L knee   LAPAROTOMY N/A 03/12/2015   Procedure: EXPLORATORY LAPAROTOMY;  Surgeon: Lynwood Pina, MD;  Location: [facility] OR;  Service: General;  Laterality: N/A;   right shoulder arthroscopic surgery  06/2005   TRANSURETHRAL RESECTION OF PROSTATE     status post TURP 2/2 [name] in 2008   VASECTOMY     Family History  Problem Relation Age of Onset   Cancer Father        unknown type cancer    Social History   Socioeconomic History   Marital status: Single    Spouse name: Not on file   Number of children: Not on file   Years of education: 12   Highest education level: Not on file  Occupational History    Employer: RETIRED    Comment: retired  Tobacco Use   Smoking status: Former    Current packs/day: 0.00    Average packs/day: 0.5 packs/day for 25.0 years (12.5 ttl pk-yrs)    Types: Cigarettes    Start date: 02/07/1963    Quit date: 02/07/1988    Years since quitting: 36.2   Smokeless tobacco: Never  Vaping Use   Vaping status: Never Used  Substance and Sexual Activity   Alcohol use: Yes    Alcohol/week: 0.0 standard drinks of alcohol    Comment: occasional    Drug use: No   Sexual activity: Not on file    Comment: condoms   Other Topics Concern   Not on file  Social History Narrative   Single, lives alone in apartment   No children   Several local siblings in area-will take him to grocery store, etc   Rides bus (75cents/trip) and bus is right outside his house   Prior employment [employer]   Reports strong faith helps him cope with life   Has had to self-cath twice daily since 2008 -Dr. Watt   [facility] for ostomy care and supplies    Ambulates w/walker-independent in ADL's-cooks pot pies and frozen dinners      Current Social History 01/02/2019        Patient lives alone in an apartment which is 1 story. There are not steps up to the entrance the patient uses.       Patient's method of transportation is city bus.      The highest level of education was some high school.      The patient is currently retired.      Identified important Relationships are  God, I got it right.  I also go riding with my brother sometimes.        Interests / Fun: I love going to football and basketball games,  mainly high school and college.  I've also been to some pro games and met Lajoyce and Shak.       Current Stressors: I do not have any stressors in my life right now.       Religious / Personal Beliefs: I believe in God    Social Drivers of Health   Financial Resource Strain: Low Risk  (04/22/2024)   Overall Financial Resource Strain (CARDIA)    Difficulty of Paying Living Expenses: Not hard at all  Food Insecurity: No Food Insecurity (04/22/2024)   Hunger Vital Sign    Worried About Running Out of Food in the Last Year: Never true    Ran Out of Food in the Last Year: Never true  Transportation Needs: Unmet Transportation Needs (04/22/2024)   PRAPARE - Administrator, Civil Service (Medical): Yes    Lack of Transportation (Non-Medical): No  Physical Activity: Sufficiently Active (04/22/2024)   Exercise Vital Sign    Days of Exercise per Week: 4 days    Minutes of Exercise per Session: 50 min  Stress: No Stress Concern Present (04/22/2024)   [employer] of Occupational Health - Occupational Stress Questionnaire    Feeling of Stress: Not at all  Social Connections: Moderately Isolated (04/22/2024)   Social Connection and Isolation Panel    Frequency of Communication with Friends and Family: More than three times a week    Frequency of Social Gatherings with Friends and Family: Twice a week    Attends Religious  Services: 1 to 4 times per year    Active Member of [employer] or Organizations: No    Attends Engineer, structural: Never    Marital Status: Never married    Tobacco Counseling Counseling given: Not Answered    Clinical Intake:  Pre-visit preparation completed: Yes  Pain : No/denies pain Pain Score: 0-No pain     BMI - recorded: 25.38 Nutritional Status: BMI 25 -29 Overweight Nutritional Risks: None Diabetes: No  No results found for: HGBA1C   How often do you need to have someone help you when you read instructions, pamphlets, or other written materials from your doctor or pharmacy?: 1 - Never What is the last grade level you completed in school?: SOME HIGH SCHOOL  Interpreter Needed?: No  Information entered by :: Jaiden Dinkins N. Caralynn Gelber, LPN.   Activities of Daily Living     04/22/2024    3:47 PM  In your present state of health, do you have any difficulty performing the following activities:  Hearing? 0  Vision? 0  Difficulty concentrating or making decisions? 0  Walking or climbing stairs? 0  Dressing or bathing? 0  Doing errands, shopping? 0  Preparing Food and eating ? N  Using the Toilet? N  In the past six months, have you accidently leaked urine? Y  Do you have problems with loss of bowel control? N  Managing your Medications? N  Managing your Finances? N  Housekeeping or managing your Housekeeping? N    Patient Care Team: Leontine Lapine, MD as PCP - Diedre Kimble Agent, MD as Consulting Physician (General Surgery) Lanny Callander, MD as Consulting Physician (Hematology) Watt Rush, MD as Attending Physician (Urology) Steva Devere CROME, RN as Registered Nurse (Medical Oncology) [facility] as Consulting Physician  I have updated your Care Teams any recent Medical Services you may have received from other providers in the past year.     Assessment:   This is a routine wellness  examination for [facility].  Hearing/Vision  screen Hearing Screening - Comments:: Denies hearing difficulties.  Vision Screening - Comments:: No rx glasses - not up to date with routine eye exams.    Goals Addressed             This Visit's Progress    04/22/2024: Mygoal is to maintain the best I can and stay alive.       Blood Pressure < 140/90         Depression Screen     04/22/2024    3:46 PM 01/31/2023   10:05 AM 07/03/2022    3:10 PM 01/10/2022   10:13 AM 08/09/2021    9:59 AM 06/15/2020   10:34 AM 01/02/2019    4:16 PM  PHQ 2/9 Scores  PHQ - 2 Score 0 0 0 0 0 1 0  PHQ- 9 Score 0 0   0 1 0    Fall Risk     04/22/2024    3:45 PM 01/31/2023   10:05 AM 07/03/2022    3:11 PM 01/10/2022   10:13 AM 08/09/2021    9:44 AM  Fall Risk   Falls in the past year? 0 0 0 0 0  Number falls in past yr: 0  0 0   Injury with Fall? 0  0 0   Risk for fall due to : No Fall Risks No Fall Risks Other (Comment) No Fall Risks Other (Comment)  Risk for fall due to: Comment     says leg gets weak at times  Follow up Falls evaluation completed Falls evaluation completed Falls evaluation completed;Falls prevention discussed  Falls evaluation completed;Follow up appointment  Falls evaluation completed;Education provided      Data saved with a previous flowsheet row definition    MEDICARE RISK AT HOME:  Medicare Risk at Home Any stairs in or around the home?: No If so, are there any without handrails?: No Home free of loose throw rugs in walkways, pet beds, electrical cords, etc?: Yes Adequate lighting in your home to reduce risk of falls?: Yes Life alert?: No Use of a cane, walker or w/c?: Yes Grab bars in the bathroom?: Yes Shower chair or bench in shower?: No Elevated toilet seat or a handicapped toilet?: No  TIMED UP AND GO:  Was the test performed?  No  Cognitive Function: Declined/Normal: No cognitive concerns noted by patient or family. Patient alert, oriented, able to answer questions appropriately and recall recent events.  No signs of memory loss or confusion.    04/22/2024    3:46 PM  MMSE - Mini Mental State Exam  Not completed: Unable to complete        04/22/2024    4:02 PM 07/03/2022    3:15 PM  6CIT Screen  What Year? 0 points 0 points  What month? 0 points 0 points  What time? 0 points 0 points  Count back from 20 0 points 0 points  Months in reverse 0 points 0 points  Repeat phrase 0 points 0 points  Total Score 0 points 0 points    Immunizations Immunization History  Administered Date(s) Administered   Influenza Split 08/20/2011, 09/08/2012   Influenza Whole 08/23/2006, 09/26/2009, 08/09/2010   Influenza,inj,Quad PF,6+ Mos 09/03/2013, 07/22/2014, 07/07/2015, 09/09/2017, 09/29/2018   Influenza-Unspecified 08/09/2021   Pneumococcal Conjugate-13 07/07/2015   Pneumococcal Polysaccharide-23 09/26/2009   Pneumococcal-Unspecified 03/23/2023   Td 02/25/2012   Tdap 09/12/2021   Zoster Recombinant(Shingrix) 01/06/2024    Screening Tests Health Maintenance  Topic Date Due   COVID-19 Vaccine (1) Never done   Zoster Vaccines- Shingrix (2 of 2) 03/02/2024   INFLUENZA VACCINE  05/22/2024   Medicare Annual Wellness (AWV)  04/22/2025   DTaP/Tdap/Td (3 - Td or Tdap) 09/13/2031   Pneumococcal Vaccine: 50+ Years  Completed   Hepatitis B Vaccines  Aged Out   HPV VACCINES  Aged Out   Meningococcal B Vaccine  Aged Out    Health Maintenance  Health Maintenance Due  Topic Date Due   COVID-19 Vaccine (1) Never done   Zoster Vaccines- Shingrix (2 of 2) 03/02/2024   Health Maintenance Items Addressed: Yes Patient aware of current care gaps.  Immunization record was verified by [facility] and updated in patient's chart.  Additional Screening:  Vision Screening: Recommended annual ophthalmology exams for early detection of glaucoma and other disorders of the eye. Would you like a referral to an eye doctor? No    Dental Screening: Recommended annual dental exams for proper oral hygiene  Community  Resource Referral / Chronic Care Management: CRR required this visit?  No   CCM required this visit?  No   Plan:    I have personally reviewed and noted the following in the patient's chart:   Medical and social history Use of alcohol, tobacco or illicit drugs  Current medications and supplements including opioid prescriptions. Patient is not currently taking opioid prescriptions. Functional ability and status Nutritional status Physical activity Advanced directives List of other physicians Hospitalizations, surgeries, and ER visits in previous 12 months Vitals Screenings to include cognitive, depression, and falls Referrals and appointments  In addition, I have reviewed and discussed with patient certain preventive protocols, quality metrics, and best practice recommendations. A written personalized care plan for preventive services as well as general preventive health recommendations were provided to patient.   Roz LOISE Fuller, LPN   11/28/7972   After Visit Summary: (Declined) Due to this being a telephonic visit, with patients personalized plan was offered to patient but patient Declined AVS at this time   Notes: Patient aware of current care gaps.  Immunization record was verified by [facility] and updated in patient's chart. Patient is due for second Shingrix vaccine and he declined any Covid vaccines.

## 2024-04-23 NOTE — Progress Notes (Deleted)
 Internal Medicine Attending:  I reviewed the AWV findings of the medical professional who conducted the visit. I was present in the office suite and immediately available to provide assistance and direction throughout the time the service was provided.

## 2024-05-07 DIAGNOSIS — N3020 Other chronic cystitis without hematuria: Secondary | ICD-10-CM | POA: Diagnosis not present

## 2024-05-07 DIAGNOSIS — R300 Dysuria: Secondary | ICD-10-CM | POA: Diagnosis not present

## 2024-05-14 DIAGNOSIS — R339 Retention of urine, unspecified: Secondary | ICD-10-CM | POA: Diagnosis not present

## 2024-05-15 NOTE — Progress Notes (Signed)
 Internal Medicine Attending:  I reviewed the AWV findings of the medical professional who conducted the visit. I was present in the office suite and immediately available to provide assistance and direction throughout the time the service was provided.

## 2024-08-05 DIAGNOSIS — R339 Retention of urine, unspecified: Secondary | ICD-10-CM | POA: Diagnosis not present

## 2025-04-28 ENCOUNTER — Ambulatory Visit
